# Patient Record
Sex: Female | Born: 1935 | Race: White | Hispanic: No | Smoking: Never smoker
Health system: Southern US, Community
[De-identification: ages and names within clinical notes are randomized; demographics above are authoritative.]

## PROBLEM LIST (undated history)

## (undated) DIAGNOSIS — M1990 Unspecified osteoarthritis, unspecified site: Secondary | ICD-10-CM

## (undated) DIAGNOSIS — Z7902 Long term (current) use of antithrombotics/antiplatelets: Secondary | ICD-10-CM

## (undated) DIAGNOSIS — J45909 Unspecified asthma, uncomplicated: Secondary | ICD-10-CM

## (undated) DIAGNOSIS — I2510 Atherosclerotic heart disease of native coronary artery without angina pectoris: Secondary | ICD-10-CM

## (undated) DIAGNOSIS — I10 Essential (primary) hypertension: Secondary | ICD-10-CM

## (undated) DIAGNOSIS — E785 Hyperlipidemia, unspecified: Secondary | ICD-10-CM

## (undated) DIAGNOSIS — Z7982 Long term (current) use of aspirin: Secondary | ICD-10-CM

## (undated) DIAGNOSIS — I447 Left bundle-branch block, unspecified: Secondary | ICD-10-CM

## (undated) DIAGNOSIS — I779 Disorder of arteries and arterioles, unspecified: Secondary | ICD-10-CM

## (undated) DIAGNOSIS — I6789 Other cerebrovascular disease: Secondary | ICD-10-CM

## (undated) DIAGNOSIS — I493 Ventricular premature depolarization: Secondary | ICD-10-CM

## (undated) DIAGNOSIS — Z9289 Personal history of other medical treatment: Secondary | ICD-10-CM

## (undated) DIAGNOSIS — M5030 Other cervical disc degeneration, unspecified cervical region: Secondary | ICD-10-CM

## (undated) DIAGNOSIS — Z8719 Personal history of other diseases of the digestive system: Secondary | ICD-10-CM

## (undated) DIAGNOSIS — J189 Pneumonia, unspecified organism: Secondary | ICD-10-CM

## (undated) DIAGNOSIS — I700 Atherosclerosis of aorta: Secondary | ICD-10-CM

## (undated) DIAGNOSIS — I639 Cerebral infarction, unspecified: Secondary | ICD-10-CM

## (undated) DIAGNOSIS — N183 Chronic kidney disease, stage 3 unspecified: Secondary | ICD-10-CM

## (undated) DIAGNOSIS — K469 Unspecified abdominal hernia without obstruction or gangrene: Secondary | ICD-10-CM

## (undated) DIAGNOSIS — K219 Gastro-esophageal reflux disease without esophagitis: Secondary | ICD-10-CM

## (undated) DIAGNOSIS — E039 Hypothyroidism, unspecified: Secondary | ICD-10-CM

## (undated) HISTORY — DX: Atherosclerotic heart disease of native coronary artery without angina pectoris: I25.10

## (undated) HISTORY — PX: ABDOMINAL HYSTERECTOMY: SHX81

## (undated) HISTORY — PX: LOOP RECORDER IMPLANT: SHX5954

## (undated) HISTORY — DX: Disorder of arteries and arterioles, unspecified: I77.9

## (undated) HISTORY — DX: Chronic kidney disease, stage 3 unspecified: N18.30

## (undated) HISTORY — DX: Hyperlipidemia, unspecified: E78.5

## (undated) HISTORY — DX: Essential (primary) hypertension: I10

## (undated) HISTORY — DX: Hypothyroidism, unspecified: E03.9

## (undated) HISTORY — PX: TOTAL VAGINAL HYSTERECTOMY: SHX2548

## (undated) HISTORY — DX: Ventricular premature depolarization: I49.3

## (undated) HISTORY — DX: Cerebral infarction, unspecified: I63.9

## (undated) HISTORY — DX: Unspecified asthma, uncomplicated: J45.909

## (undated) HISTORY — PX: TOTAL HIP ARTHROPLASTY: SHX124

## (undated) HISTORY — PX: APPENDECTOMY: SHX54

## (undated) HISTORY — DX: Unspecified osteoarthritis, unspecified site: M19.90

## (undated) HISTORY — PX: EYE SURGERY: SHX253

## (undated) HISTORY — PX: CHOLECYSTECTOMY: SHX55

## (undated) HISTORY — PX: HAND SURGERY: SHX662

## (undated) HISTORY — PX: CATARACT EXTRACTION: SUR2

---

## 2011-12-05 HISTORY — PX: CORONARY ANGIOPLASTY: SHX604

## 2014-12-04 HISTORY — PX: NISSEN FUNDOPLICATION: SHX2091

## 2014-12-04 HISTORY — PX: HERNIA REPAIR: SHX51

## 2015-02-02 DIAGNOSIS — I2510 Atherosclerotic heart disease of native coronary artery without angina pectoris: Secondary | ICD-10-CM

## 2015-02-02 HISTORY — DX: Atherosclerotic heart disease of native coronary artery without angina pectoris: I25.10

## 2015-02-12 ENCOUNTER — Encounter: Payer: Self-pay | Admitting: Cardiovascular Disease

## 2015-02-12 HISTORY — PX: CORONARY ANGIOPLASTY WITH STENT PLACEMENT: SHX49

## 2015-12-05 DIAGNOSIS — I639 Cerebral infarction, unspecified: Secondary | ICD-10-CM

## 2015-12-05 HISTORY — PX: REPLACEMENT TOTAL KNEE: SUR1224

## 2015-12-05 HISTORY — DX: Cerebral infarction, unspecified: I63.9

## 2016-12-12 DIAGNOSIS — I10 Essential (primary) hypertension: Secondary | ICD-10-CM | POA: Diagnosis not present

## 2016-12-12 DIAGNOSIS — F419 Anxiety disorder, unspecified: Secondary | ICD-10-CM | POA: Diagnosis not present

## 2016-12-12 DIAGNOSIS — R578 Other shock: Secondary | ICD-10-CM | POA: Diagnosis not present

## 2016-12-12 DIAGNOSIS — E872 Acidosis: Secondary | ICD-10-CM | POA: Diagnosis not present

## 2016-12-12 DIAGNOSIS — Z79899 Other long term (current) drug therapy: Secondary | ICD-10-CM | POA: Diagnosis not present

## 2016-12-12 DIAGNOSIS — K921 Melena: Secondary | ICD-10-CM | POA: Diagnosis not present

## 2016-12-12 DIAGNOSIS — E509 Vitamin A deficiency, unspecified: Secondary | ICD-10-CM | POA: Diagnosis not present

## 2016-12-12 DIAGNOSIS — K2970 Gastritis, unspecified, without bleeding: Secondary | ICD-10-CM | POA: Diagnosis not present

## 2016-12-12 DIAGNOSIS — D500 Iron deficiency anemia secondary to blood loss (chronic): Secondary | ICD-10-CM | POA: Diagnosis not present

## 2016-12-12 DIAGNOSIS — E861 Hypovolemia: Secondary | ICD-10-CM | POA: Diagnosis not present

## 2016-12-12 DIAGNOSIS — E860 Dehydration: Secondary | ICD-10-CM | POA: Diagnosis not present

## 2016-12-12 DIAGNOSIS — K920 Hematemesis: Secondary | ICD-10-CM | POA: Diagnosis not present

## 2016-12-12 DIAGNOSIS — D509 Iron deficiency anemia, unspecified: Secondary | ICD-10-CM | POA: Diagnosis present

## 2016-12-12 DIAGNOSIS — I2510 Atherosclerotic heart disease of native coronary artery without angina pectoris: Secondary | ICD-10-CM | POA: Diagnosis not present

## 2016-12-12 DIAGNOSIS — N179 Acute kidney failure, unspecified: Secondary | ICD-10-CM | POA: Diagnosis not present

## 2016-12-12 DIAGNOSIS — K2950 Unspecified chronic gastritis without bleeding: Secondary | ICD-10-CM | POA: Diagnosis present

## 2016-12-12 DIAGNOSIS — J45909 Unspecified asthma, uncomplicated: Secondary | ICD-10-CM | POA: Diagnosis present

## 2016-12-12 DIAGNOSIS — R531 Weakness: Secondary | ICD-10-CM | POA: Diagnosis not present

## 2016-12-12 DIAGNOSIS — K922 Gastrointestinal hemorrhage, unspecified: Secondary | ICD-10-CM | POA: Diagnosis not present

## 2016-12-12 DIAGNOSIS — K210 Gastro-esophageal reflux disease with esophagitis: Secondary | ICD-10-CM | POA: Diagnosis not present

## 2016-12-12 DIAGNOSIS — R079 Chest pain, unspecified: Secondary | ICD-10-CM | POA: Diagnosis not present

## 2016-12-12 DIAGNOSIS — F418 Other specified anxiety disorders: Secondary | ICD-10-CM | POA: Diagnosis not present

## 2016-12-12 DIAGNOSIS — F329 Major depressive disorder, single episode, unspecified: Secondary | ICD-10-CM | POA: Diagnosis not present

## 2016-12-12 DIAGNOSIS — R739 Hyperglycemia, unspecified: Secondary | ICD-10-CM | POA: Diagnosis not present

## 2016-12-12 DIAGNOSIS — E785 Hyperlipidemia, unspecified: Secondary | ICD-10-CM | POA: Diagnosis not present

## 2016-12-12 DIAGNOSIS — E039 Hypothyroidism, unspecified: Secondary | ICD-10-CM | POA: Diagnosis not present

## 2016-12-12 DIAGNOSIS — I959 Hypotension, unspecified: Secondary | ICD-10-CM | POA: Diagnosis not present

## 2016-12-12 DIAGNOSIS — R112 Nausea with vomiting, unspecified: Secondary | ICD-10-CM | POA: Diagnosis not present

## 2016-12-12 DIAGNOSIS — K228 Other specified diseases of esophagus: Secondary | ICD-10-CM | POA: Diagnosis present

## 2016-12-12 DIAGNOSIS — D62 Acute posthemorrhagic anemia: Secondary | ICD-10-CM | POA: Diagnosis not present

## 2016-12-29 DIAGNOSIS — K920 Hematemesis: Secondary | ICD-10-CM | POA: Diagnosis not present

## 2016-12-29 DIAGNOSIS — K921 Melena: Secondary | ICD-10-CM | POA: Diagnosis not present

## 2016-12-29 DIAGNOSIS — K210 Gastro-esophageal reflux disease with esophagitis: Secondary | ICD-10-CM | POA: Diagnosis not present

## 2017-01-26 DIAGNOSIS — E559 Vitamin D deficiency, unspecified: Secondary | ICD-10-CM | POA: Diagnosis not present

## 2017-01-26 DIAGNOSIS — R740 Nonspecific elevation of levels of transaminase and lactic acid dehydrogenase [LDH]: Secondary | ICD-10-CM | POA: Diagnosis not present

## 2017-01-26 DIAGNOSIS — Z6828 Body mass index (BMI) 28.0-28.9, adult: Secondary | ICD-10-CM | POA: Diagnosis not present

## 2017-01-26 DIAGNOSIS — I10 Essential (primary) hypertension: Secondary | ICD-10-CM | POA: Diagnosis not present

## 2017-01-26 DIAGNOSIS — M1990 Unspecified osteoarthritis, unspecified site: Secondary | ICD-10-CM | POA: Diagnosis not present

## 2017-01-26 DIAGNOSIS — E876 Hypokalemia: Secondary | ICD-10-CM | POA: Diagnosis not present

## 2017-01-26 DIAGNOSIS — M25569 Pain in unspecified knee: Secondary | ICD-10-CM | POA: Diagnosis not present

## 2017-01-26 DIAGNOSIS — R5383 Other fatigue: Secondary | ICD-10-CM | POA: Diagnosis not present

## 2017-01-26 DIAGNOSIS — N390 Urinary tract infection, site not specified: Secondary | ICD-10-CM | POA: Diagnosis not present

## 2017-01-26 DIAGNOSIS — I2510 Atherosclerotic heart disease of native coronary artery without angina pectoris: Secondary | ICD-10-CM | POA: Diagnosis not present

## 2017-01-26 DIAGNOSIS — R609 Edema, unspecified: Secondary | ICD-10-CM | POA: Diagnosis not present

## 2017-01-26 DIAGNOSIS — N3281 Overactive bladder: Secondary | ICD-10-CM | POA: Diagnosis not present

## 2017-01-26 DIAGNOSIS — E039 Hypothyroidism, unspecified: Secondary | ICD-10-CM | POA: Diagnosis not present

## 2017-01-26 DIAGNOSIS — E784 Other hyperlipidemia: Secondary | ICD-10-CM | POA: Diagnosis not present

## 2017-01-26 DIAGNOSIS — R2681 Unsteadiness on feet: Secondary | ICD-10-CM | POA: Diagnosis not present

## 2017-01-30 DIAGNOSIS — M25569 Pain in unspecified knee: Secondary | ICD-10-CM | POA: Diagnosis not present

## 2017-01-30 DIAGNOSIS — E559 Vitamin D deficiency, unspecified: Secondary | ICD-10-CM | POA: Diagnosis not present

## 2017-01-30 DIAGNOSIS — N3281 Overactive bladder: Secondary | ICD-10-CM | POA: Diagnosis not present

## 2017-01-30 DIAGNOSIS — N390 Urinary tract infection, site not specified: Secondary | ICD-10-CM | POA: Diagnosis not present

## 2017-02-05 DIAGNOSIS — I10 Essential (primary) hypertension: Secondary | ICD-10-CM | POA: Diagnosis not present

## 2017-02-05 DIAGNOSIS — M542 Cervicalgia: Secondary | ICD-10-CM | POA: Diagnosis not present

## 2017-02-05 DIAGNOSIS — Z7982 Long term (current) use of aspirin: Secondary | ICD-10-CM | POA: Diagnosis not present

## 2017-02-05 DIAGNOSIS — Z79899 Other long term (current) drug therapy: Secondary | ICD-10-CM | POA: Diagnosis not present

## 2017-02-05 DIAGNOSIS — J9811 Atelectasis: Secondary | ICD-10-CM | POA: Diagnosis not present

## 2017-02-05 DIAGNOSIS — R079 Chest pain, unspecified: Secondary | ICD-10-CM | POA: Diagnosis not present

## 2017-02-06 DIAGNOSIS — M542 Cervicalgia: Secondary | ICD-10-CM | POA: Diagnosis not present

## 2017-02-09 DIAGNOSIS — M1990 Unspecified osteoarthritis, unspecified site: Secondary | ICD-10-CM | POA: Diagnosis not present

## 2017-02-09 DIAGNOSIS — E559 Vitamin D deficiency, unspecified: Secondary | ICD-10-CM | POA: Diagnosis not present

## 2017-02-09 DIAGNOSIS — N3281 Overactive bladder: Secondary | ICD-10-CM | POA: Diagnosis not present

## 2017-02-09 DIAGNOSIS — M25569 Pain in unspecified knee: Secondary | ICD-10-CM | POA: Diagnosis not present

## 2017-02-09 DIAGNOSIS — F329 Major depressive disorder, single episode, unspecified: Secondary | ICD-10-CM | POA: Diagnosis not present

## 2017-02-09 DIAGNOSIS — M542 Cervicalgia: Secondary | ICD-10-CM | POA: Diagnosis not present

## 2017-02-15 DIAGNOSIS — M25561 Pain in right knee: Secondary | ICD-10-CM | POA: Diagnosis not present

## 2017-02-15 DIAGNOSIS — M25562 Pain in left knee: Secondary | ICD-10-CM | POA: Diagnosis not present

## 2017-02-23 DIAGNOSIS — Z6827 Body mass index (BMI) 27.0-27.9, adult: Secondary | ICD-10-CM | POA: Diagnosis not present

## 2017-02-23 DIAGNOSIS — M2550 Pain in unspecified joint: Secondary | ICD-10-CM | POA: Diagnosis not present

## 2017-02-23 DIAGNOSIS — D649 Anemia, unspecified: Secondary | ICD-10-CM | POA: Diagnosis not present

## 2017-02-28 DIAGNOSIS — I25110 Atherosclerotic heart disease of native coronary artery with unstable angina pectoris: Secondary | ICD-10-CM | POA: Diagnosis not present

## 2017-02-28 DIAGNOSIS — E039 Hypothyroidism, unspecified: Secondary | ICD-10-CM | POA: Diagnosis not present

## 2017-02-28 DIAGNOSIS — E7800 Pure hypercholesterolemia, unspecified: Secondary | ICD-10-CM | POA: Diagnosis not present

## 2017-02-28 DIAGNOSIS — I10 Essential (primary) hypertension: Secondary | ICD-10-CM | POA: Diagnosis not present

## 2017-03-01 DIAGNOSIS — M25562 Pain in left knee: Secondary | ICD-10-CM | POA: Diagnosis not present

## 2017-03-01 DIAGNOSIS — M25561 Pain in right knee: Secondary | ICD-10-CM | POA: Diagnosis not present

## 2017-03-06 DIAGNOSIS — I10 Essential (primary) hypertension: Secondary | ICD-10-CM | POA: Diagnosis not present

## 2017-03-06 DIAGNOSIS — R791 Abnormal coagulation profile: Secondary | ICD-10-CM | POA: Diagnosis not present

## 2017-03-06 DIAGNOSIS — I2510 Atherosclerotic heart disease of native coronary artery without angina pectoris: Secondary | ICD-10-CM | POA: Diagnosis not present

## 2017-03-06 DIAGNOSIS — M25569 Pain in unspecified knee: Secondary | ICD-10-CM | POA: Diagnosis not present

## 2017-03-06 DIAGNOSIS — D649 Anemia, unspecified: Secondary | ICD-10-CM | POA: Diagnosis not present

## 2017-03-06 DIAGNOSIS — Z6826 Body mass index (BMI) 26.0-26.9, adult: Secondary | ICD-10-CM | POA: Diagnosis not present

## 2017-03-06 DIAGNOSIS — N390 Urinary tract infection, site not specified: Secondary | ICD-10-CM | POA: Diagnosis not present

## 2017-03-06 DIAGNOSIS — Z01811 Encounter for preprocedural respiratory examination: Secondary | ICD-10-CM | POA: Diagnosis not present

## 2017-03-06 DIAGNOSIS — R6889 Other general symptoms and signs: Secondary | ICD-10-CM | POA: Diagnosis not present

## 2017-03-06 DIAGNOSIS — Z01818 Encounter for other preprocedural examination: Secondary | ICD-10-CM | POA: Diagnosis not present

## 2017-03-20 DIAGNOSIS — Z8249 Family history of ischemic heart disease and other diseases of the circulatory system: Secondary | ICD-10-CM | POA: Diagnosis not present

## 2017-03-20 DIAGNOSIS — Z7952 Long term (current) use of systemic steroids: Secondary | ICD-10-CM | POA: Diagnosis not present

## 2017-03-20 DIAGNOSIS — F418 Other specified anxiety disorders: Secondary | ICD-10-CM | POA: Diagnosis not present

## 2017-03-20 DIAGNOSIS — Z96652 Presence of left artificial knee joint: Secondary | ICD-10-CM | POA: Diagnosis not present

## 2017-03-20 DIAGNOSIS — N3281 Overactive bladder: Secondary | ICD-10-CM | POA: Diagnosis present

## 2017-03-20 DIAGNOSIS — Z79899 Other long term (current) drug therapy: Secondary | ICD-10-CM | POA: Diagnosis not present

## 2017-03-20 DIAGNOSIS — F329 Major depressive disorder, single episode, unspecified: Secondary | ICD-10-CM | POA: Diagnosis not present

## 2017-03-20 DIAGNOSIS — Z823 Family history of stroke: Secondary | ICD-10-CM | POA: Diagnosis not present

## 2017-03-20 DIAGNOSIS — Z6827 Body mass index (BMI) 27.0-27.9, adult: Secondary | ICD-10-CM | POA: Diagnosis not present

## 2017-03-20 DIAGNOSIS — M25462 Effusion, left knee: Secondary | ICD-10-CM | POA: Diagnosis present

## 2017-03-20 DIAGNOSIS — M25569 Pain in unspecified knee: Secondary | ICD-10-CM | POA: Diagnosis not present

## 2017-03-20 DIAGNOSIS — F419 Anxiety disorder, unspecified: Secondary | ICD-10-CM | POA: Diagnosis present

## 2017-03-20 DIAGNOSIS — Z791 Long term (current) use of non-steroidal anti-inflammatories (NSAID): Secondary | ICD-10-CM | POA: Diagnosis not present

## 2017-03-20 DIAGNOSIS — M21061 Valgus deformity, not elsewhere classified, right knee: Secondary | ICD-10-CM | POA: Diagnosis not present

## 2017-03-20 DIAGNOSIS — R609 Edema, unspecified: Secondary | ICD-10-CM | POA: Diagnosis not present

## 2017-03-20 DIAGNOSIS — I10 Essential (primary) hypertension: Secondary | ICD-10-CM | POA: Diagnosis not present

## 2017-03-20 DIAGNOSIS — R2689 Other abnormalities of gait and mobility: Secondary | ICD-10-CM | POA: Diagnosis not present

## 2017-03-20 DIAGNOSIS — E785 Hyperlipidemia, unspecified: Secondary | ICD-10-CM | POA: Diagnosis present

## 2017-03-20 DIAGNOSIS — Z7982 Long term (current) use of aspirin: Secondary | ICD-10-CM | POA: Diagnosis not present

## 2017-03-20 DIAGNOSIS — M6281 Muscle weakness (generalized): Secondary | ICD-10-CM | POA: Diagnosis not present

## 2017-03-20 DIAGNOSIS — Z96651 Presence of right artificial knee joint: Secondary | ICD-10-CM | POA: Diagnosis not present

## 2017-03-20 DIAGNOSIS — Z96653 Presence of artificial knee joint, bilateral: Secondary | ICD-10-CM | POA: Diagnosis not present

## 2017-03-20 DIAGNOSIS — M810 Age-related osteoporosis without current pathological fracture: Secondary | ICD-10-CM | POA: Diagnosis not present

## 2017-03-20 DIAGNOSIS — M170 Bilateral primary osteoarthritis of knee: Secondary | ICD-10-CM | POA: Diagnosis not present

## 2017-03-20 DIAGNOSIS — E039 Hypothyroidism, unspecified: Secondary | ICD-10-CM | POA: Diagnosis not present

## 2017-03-20 DIAGNOSIS — R278 Other lack of coordination: Secondary | ICD-10-CM | POA: Diagnosis not present

## 2017-03-20 DIAGNOSIS — M25562 Pain in left knee: Secondary | ICD-10-CM | POA: Diagnosis not present

## 2017-03-20 DIAGNOSIS — D649 Anemia, unspecified: Secondary | ICD-10-CM | POA: Diagnosis not present

## 2017-03-20 DIAGNOSIS — M25461 Effusion, right knee: Secondary | ICD-10-CM | POA: Diagnosis present

## 2017-03-20 DIAGNOSIS — I9789 Other postprocedural complications and disorders of the circulatory system, not elsewhere classified: Secondary | ICD-10-CM | POA: Diagnosis not present

## 2017-03-20 DIAGNOSIS — Z7951 Long term (current) use of inhaled steroids: Secondary | ICD-10-CM | POA: Diagnosis not present

## 2017-03-20 DIAGNOSIS — I2510 Atherosclerotic heart disease of native coronary artery without angina pectoris: Secondary | ICD-10-CM | POA: Diagnosis not present

## 2017-03-20 DIAGNOSIS — R2681 Unsteadiness on feet: Secondary | ICD-10-CM | POA: Diagnosis not present

## 2017-03-20 DIAGNOSIS — M21062 Valgus deformity, not elsewhere classified, left knee: Secondary | ICD-10-CM | POA: Diagnosis not present

## 2017-03-20 DIAGNOSIS — M25561 Pain in right knee: Secondary | ICD-10-CM | POA: Diagnosis not present

## 2017-03-20 DIAGNOSIS — Z471 Aftercare following joint replacement surgery: Secondary | ICD-10-CM | POA: Diagnosis not present

## 2017-03-20 DIAGNOSIS — E876 Hypokalemia: Secondary | ICD-10-CM | POA: Diagnosis not present

## 2017-03-23 DIAGNOSIS — E785 Hyperlipidemia, unspecified: Secondary | ICD-10-CM | POA: Diagnosis not present

## 2017-03-23 DIAGNOSIS — R2689 Other abnormalities of gait and mobility: Secondary | ICD-10-CM | POA: Diagnosis not present

## 2017-03-23 DIAGNOSIS — N3281 Overactive bladder: Secondary | ICD-10-CM | POA: Diagnosis not present

## 2017-03-23 DIAGNOSIS — M25562 Pain in left knee: Secondary | ICD-10-CM | POA: Diagnosis not present

## 2017-03-23 DIAGNOSIS — K219 Gastro-esophageal reflux disease without esophagitis: Secondary | ICD-10-CM | POA: Diagnosis not present

## 2017-03-23 DIAGNOSIS — M170 Bilateral primary osteoarthritis of knee: Secondary | ICD-10-CM | POA: Diagnosis not present

## 2017-03-23 DIAGNOSIS — R2681 Unsteadiness on feet: Secondary | ICD-10-CM | POA: Diagnosis not present

## 2017-03-23 DIAGNOSIS — Z6827 Body mass index (BMI) 27.0-27.9, adult: Secondary | ICD-10-CM | POA: Diagnosis not present

## 2017-03-23 DIAGNOSIS — E876 Hypokalemia: Secondary | ICD-10-CM | POA: Diagnosis not present

## 2017-03-23 DIAGNOSIS — Z96653 Presence of artificial knee joint, bilateral: Secondary | ICD-10-CM | POA: Diagnosis not present

## 2017-03-23 DIAGNOSIS — M6281 Muscle weakness (generalized): Secondary | ICD-10-CM | POA: Diagnosis not present

## 2017-03-23 DIAGNOSIS — I2510 Atherosclerotic heart disease of native coronary artery without angina pectoris: Secondary | ICD-10-CM | POA: Diagnosis not present

## 2017-03-23 DIAGNOSIS — R278 Other lack of coordination: Secondary | ICD-10-CM | POA: Diagnosis not present

## 2017-03-23 DIAGNOSIS — M25569 Pain in unspecified knee: Secondary | ICD-10-CM | POA: Diagnosis not present

## 2017-03-23 DIAGNOSIS — F329 Major depressive disorder, single episode, unspecified: Secondary | ICD-10-CM | POA: Diagnosis not present

## 2017-03-23 DIAGNOSIS — D649 Anemia, unspecified: Secondary | ICD-10-CM | POA: Diagnosis not present

## 2017-03-23 DIAGNOSIS — E039 Hypothyroidism, unspecified: Secondary | ICD-10-CM | POA: Diagnosis not present

## 2017-03-23 DIAGNOSIS — R609 Edema, unspecified: Secondary | ICD-10-CM | POA: Diagnosis not present

## 2017-03-23 DIAGNOSIS — I10 Essential (primary) hypertension: Secondary | ICD-10-CM | POA: Diagnosis not present

## 2017-03-23 DIAGNOSIS — M25561 Pain in right knee: Secondary | ICD-10-CM | POA: Diagnosis not present

## 2017-03-26 DIAGNOSIS — K219 Gastro-esophageal reflux disease without esophagitis: Secondary | ICD-10-CM | POA: Diagnosis not present

## 2017-03-26 DIAGNOSIS — I2510 Atherosclerotic heart disease of native coronary artery without angina pectoris: Secondary | ICD-10-CM | POA: Diagnosis not present

## 2017-03-26 DIAGNOSIS — I10 Essential (primary) hypertension: Secondary | ICD-10-CM | POA: Diagnosis not present

## 2017-03-26 DIAGNOSIS — E785 Hyperlipidemia, unspecified: Secondary | ICD-10-CM | POA: Diagnosis not present

## 2017-03-27 DIAGNOSIS — I10 Essential (primary) hypertension: Secondary | ICD-10-CM | POA: Diagnosis not present

## 2017-03-27 DIAGNOSIS — K219 Gastro-esophageal reflux disease without esophagitis: Secondary | ICD-10-CM | POA: Diagnosis not present

## 2017-03-27 DIAGNOSIS — I2510 Atherosclerotic heart disease of native coronary artery without angina pectoris: Secondary | ICD-10-CM | POA: Diagnosis not present

## 2017-03-27 DIAGNOSIS — E785 Hyperlipidemia, unspecified: Secondary | ICD-10-CM | POA: Diagnosis not present

## 2017-03-29 DIAGNOSIS — E785 Hyperlipidemia, unspecified: Secondary | ICD-10-CM | POA: Diagnosis not present

## 2017-03-29 DIAGNOSIS — I2510 Atherosclerotic heart disease of native coronary artery without angina pectoris: Secondary | ICD-10-CM | POA: Diagnosis not present

## 2017-03-29 DIAGNOSIS — K219 Gastro-esophageal reflux disease without esophagitis: Secondary | ICD-10-CM | POA: Diagnosis not present

## 2017-03-29 DIAGNOSIS — I10 Essential (primary) hypertension: Secondary | ICD-10-CM | POA: Diagnosis not present

## 2017-03-30 DIAGNOSIS — K219 Gastro-esophageal reflux disease without esophagitis: Secondary | ICD-10-CM | POA: Diagnosis not present

## 2017-03-30 DIAGNOSIS — I10 Essential (primary) hypertension: Secondary | ICD-10-CM | POA: Diagnosis not present

## 2017-03-30 DIAGNOSIS — E785 Hyperlipidemia, unspecified: Secondary | ICD-10-CM | POA: Diagnosis not present

## 2017-03-30 DIAGNOSIS — I2510 Atherosclerotic heart disease of native coronary artery without angina pectoris: Secondary | ICD-10-CM | POA: Diagnosis not present

## 2017-04-02 DIAGNOSIS — I2510 Atherosclerotic heart disease of native coronary artery without angina pectoris: Secondary | ICD-10-CM | POA: Diagnosis not present

## 2017-04-02 DIAGNOSIS — E785 Hyperlipidemia, unspecified: Secondary | ICD-10-CM | POA: Diagnosis not present

## 2017-04-02 DIAGNOSIS — Z96653 Presence of artificial knee joint, bilateral: Secondary | ICD-10-CM | POA: Diagnosis not present

## 2017-04-02 DIAGNOSIS — K219 Gastro-esophageal reflux disease without esophagitis: Secondary | ICD-10-CM | POA: Diagnosis not present

## 2017-04-02 DIAGNOSIS — I10 Essential (primary) hypertension: Secondary | ICD-10-CM | POA: Diagnosis not present

## 2017-04-03 DIAGNOSIS — I2510 Atherosclerotic heart disease of native coronary artery without angina pectoris: Secondary | ICD-10-CM | POA: Diagnosis not present

## 2017-04-03 DIAGNOSIS — E785 Hyperlipidemia, unspecified: Secondary | ICD-10-CM | POA: Diagnosis not present

## 2017-04-03 DIAGNOSIS — K219 Gastro-esophageal reflux disease without esophagitis: Secondary | ICD-10-CM | POA: Diagnosis not present

## 2017-04-03 DIAGNOSIS — I10 Essential (primary) hypertension: Secondary | ICD-10-CM | POA: Diagnosis not present

## 2017-04-06 DIAGNOSIS — M170 Bilateral primary osteoarthritis of knee: Secondary | ICD-10-CM | POA: Diagnosis not present

## 2017-04-06 DIAGNOSIS — R262 Difficulty in walking, not elsewhere classified: Secondary | ICD-10-CM | POA: Diagnosis not present

## 2017-04-06 DIAGNOSIS — Z96653 Presence of artificial knee joint, bilateral: Secondary | ICD-10-CM | POA: Diagnosis not present

## 2017-04-06 DIAGNOSIS — M6281 Muscle weakness (generalized): Secondary | ICD-10-CM | POA: Diagnosis not present

## 2017-04-06 DIAGNOSIS — R269 Unspecified abnormalities of gait and mobility: Secondary | ICD-10-CM | POA: Diagnosis not present

## 2017-04-09 DIAGNOSIS — Z96653 Presence of artificial knee joint, bilateral: Secondary | ICD-10-CM | POA: Diagnosis not present

## 2017-04-09 DIAGNOSIS — R262 Difficulty in walking, not elsewhere classified: Secondary | ICD-10-CM | POA: Diagnosis not present

## 2017-04-09 DIAGNOSIS — M6281 Muscle weakness (generalized): Secondary | ICD-10-CM | POA: Diagnosis not present

## 2017-04-09 DIAGNOSIS — M170 Bilateral primary osteoarthritis of knee: Secondary | ICD-10-CM | POA: Diagnosis not present

## 2017-04-09 DIAGNOSIS — R269 Unspecified abnormalities of gait and mobility: Secondary | ICD-10-CM | POA: Diagnosis not present

## 2017-04-10 DIAGNOSIS — R262 Difficulty in walking, not elsewhere classified: Secondary | ICD-10-CM | POA: Diagnosis not present

## 2017-04-10 DIAGNOSIS — M6281 Muscle weakness (generalized): Secondary | ICD-10-CM | POA: Diagnosis not present

## 2017-04-10 DIAGNOSIS — R269 Unspecified abnormalities of gait and mobility: Secondary | ICD-10-CM | POA: Diagnosis not present

## 2017-04-10 DIAGNOSIS — Z96653 Presence of artificial knee joint, bilateral: Secondary | ICD-10-CM | POA: Diagnosis not present

## 2017-04-10 DIAGNOSIS — M170 Bilateral primary osteoarthritis of knee: Secondary | ICD-10-CM | POA: Diagnosis not present

## 2017-04-11 DIAGNOSIS — M6281 Muscle weakness (generalized): Secondary | ICD-10-CM | POA: Diagnosis not present

## 2017-04-11 DIAGNOSIS — M170 Bilateral primary osteoarthritis of knee: Secondary | ICD-10-CM | POA: Diagnosis not present

## 2017-04-11 DIAGNOSIS — R269 Unspecified abnormalities of gait and mobility: Secondary | ICD-10-CM | POA: Diagnosis not present

## 2017-04-11 DIAGNOSIS — Z96653 Presence of artificial knee joint, bilateral: Secondary | ICD-10-CM | POA: Diagnosis not present

## 2017-04-11 DIAGNOSIS — R262 Difficulty in walking, not elsewhere classified: Secondary | ICD-10-CM | POA: Diagnosis not present

## 2017-04-13 DIAGNOSIS — M6281 Muscle weakness (generalized): Secondary | ICD-10-CM | POA: Diagnosis not present

## 2017-04-13 DIAGNOSIS — Z96653 Presence of artificial knee joint, bilateral: Secondary | ICD-10-CM | POA: Diagnosis not present

## 2017-04-13 DIAGNOSIS — R269 Unspecified abnormalities of gait and mobility: Secondary | ICD-10-CM | POA: Diagnosis not present

## 2017-04-13 DIAGNOSIS — M170 Bilateral primary osteoarthritis of knee: Secondary | ICD-10-CM | POA: Diagnosis not present

## 2017-04-13 DIAGNOSIS — R262 Difficulty in walking, not elsewhere classified: Secondary | ICD-10-CM | POA: Diagnosis not present

## 2017-04-16 DIAGNOSIS — R7989 Other specified abnormal findings of blood chemistry: Secondary | ICD-10-CM | POA: Diagnosis not present

## 2017-04-16 DIAGNOSIS — Z6828 Body mass index (BMI) 28.0-28.9, adult: Secondary | ICD-10-CM | POA: Diagnosis not present

## 2017-04-16 DIAGNOSIS — D649 Anemia, unspecified: Secondary | ICD-10-CM | POA: Diagnosis not present

## 2017-04-16 DIAGNOSIS — L03116 Cellulitis of left lower limb: Secondary | ICD-10-CM | POA: Diagnosis not present

## 2017-04-16 DIAGNOSIS — R609 Edema, unspecified: Secondary | ICD-10-CM | POA: Diagnosis not present

## 2017-04-16 DIAGNOSIS — M25569 Pain in unspecified knee: Secondary | ICD-10-CM | POA: Diagnosis not present

## 2017-04-16 DIAGNOSIS — K219 Gastro-esophageal reflux disease without esophagitis: Secondary | ICD-10-CM | POA: Diagnosis not present

## 2017-04-16 DIAGNOSIS — M1990 Unspecified osteoarthritis, unspecified site: Secondary | ICD-10-CM | POA: Diagnosis not present

## 2017-04-17 DIAGNOSIS — Z96653 Presence of artificial knee joint, bilateral: Secondary | ICD-10-CM | POA: Diagnosis not present

## 2017-04-17 DIAGNOSIS — R269 Unspecified abnormalities of gait and mobility: Secondary | ICD-10-CM | POA: Diagnosis not present

## 2017-04-17 DIAGNOSIS — R262 Difficulty in walking, not elsewhere classified: Secondary | ICD-10-CM | POA: Diagnosis not present

## 2017-04-17 DIAGNOSIS — M6281 Muscle weakness (generalized): Secondary | ICD-10-CM | POA: Diagnosis not present

## 2017-04-17 DIAGNOSIS — M170 Bilateral primary osteoarthritis of knee: Secondary | ICD-10-CM | POA: Diagnosis not present

## 2017-04-19 DIAGNOSIS — Z96653 Presence of artificial knee joint, bilateral: Secondary | ICD-10-CM | POA: Diagnosis not present

## 2017-04-19 DIAGNOSIS — M170 Bilateral primary osteoarthritis of knee: Secondary | ICD-10-CM | POA: Diagnosis not present

## 2017-04-19 DIAGNOSIS — R269 Unspecified abnormalities of gait and mobility: Secondary | ICD-10-CM | POA: Diagnosis not present

## 2017-04-19 DIAGNOSIS — R262 Difficulty in walking, not elsewhere classified: Secondary | ICD-10-CM | POA: Diagnosis not present

## 2017-04-19 DIAGNOSIS — M6281 Muscle weakness (generalized): Secondary | ICD-10-CM | POA: Diagnosis not present

## 2017-04-20 DIAGNOSIS — R269 Unspecified abnormalities of gait and mobility: Secondary | ICD-10-CM | POA: Diagnosis not present

## 2017-04-20 DIAGNOSIS — M170 Bilateral primary osteoarthritis of knee: Secondary | ICD-10-CM | POA: Diagnosis not present

## 2017-04-20 DIAGNOSIS — R262 Difficulty in walking, not elsewhere classified: Secondary | ICD-10-CM | POA: Diagnosis not present

## 2017-04-20 DIAGNOSIS — M6281 Muscle weakness (generalized): Secondary | ICD-10-CM | POA: Diagnosis not present

## 2017-04-20 DIAGNOSIS — Z96653 Presence of artificial knee joint, bilateral: Secondary | ICD-10-CM | POA: Diagnosis not present

## 2017-04-23 DIAGNOSIS — M1990 Unspecified osteoarthritis, unspecified site: Secondary | ICD-10-CM | POA: Diagnosis not present

## 2017-04-23 DIAGNOSIS — L03116 Cellulitis of left lower limb: Secondary | ICD-10-CM | POA: Diagnosis not present

## 2017-04-23 DIAGNOSIS — J302 Other seasonal allergic rhinitis: Secondary | ICD-10-CM | POA: Diagnosis not present

## 2017-04-23 DIAGNOSIS — J45909 Unspecified asthma, uncomplicated: Secondary | ICD-10-CM | POA: Diagnosis not present

## 2017-04-23 DIAGNOSIS — D649 Anemia, unspecified: Secondary | ICD-10-CM | POA: Diagnosis not present

## 2017-04-23 DIAGNOSIS — Z6828 Body mass index (BMI) 28.0-28.9, adult: Secondary | ICD-10-CM | POA: Diagnosis not present

## 2017-04-23 DIAGNOSIS — R609 Edema, unspecified: Secondary | ICD-10-CM | POA: Diagnosis not present

## 2017-04-24 DIAGNOSIS — R269 Unspecified abnormalities of gait and mobility: Secondary | ICD-10-CM | POA: Diagnosis not present

## 2017-04-24 DIAGNOSIS — M170 Bilateral primary osteoarthritis of knee: Secondary | ICD-10-CM | POA: Diagnosis not present

## 2017-04-24 DIAGNOSIS — Z96653 Presence of artificial knee joint, bilateral: Secondary | ICD-10-CM | POA: Diagnosis not present

## 2017-04-24 DIAGNOSIS — M6281 Muscle weakness (generalized): Secondary | ICD-10-CM | POA: Diagnosis not present

## 2017-04-24 DIAGNOSIS — R262 Difficulty in walking, not elsewhere classified: Secondary | ICD-10-CM | POA: Diagnosis not present

## 2017-04-26 DIAGNOSIS — M6281 Muscle weakness (generalized): Secondary | ICD-10-CM | POA: Diagnosis not present

## 2017-04-26 DIAGNOSIS — Z96653 Presence of artificial knee joint, bilateral: Secondary | ICD-10-CM | POA: Diagnosis not present

## 2017-04-26 DIAGNOSIS — R262 Difficulty in walking, not elsewhere classified: Secondary | ICD-10-CM | POA: Diagnosis not present

## 2017-04-26 DIAGNOSIS — R269 Unspecified abnormalities of gait and mobility: Secondary | ICD-10-CM | POA: Diagnosis not present

## 2017-04-26 DIAGNOSIS — M170 Bilateral primary osteoarthritis of knee: Secondary | ICD-10-CM | POA: Diagnosis not present

## 2017-04-27 DIAGNOSIS — E785 Hyperlipidemia, unspecified: Secondary | ICD-10-CM | POA: Diagnosis not present

## 2017-04-27 DIAGNOSIS — R4182 Altered mental status, unspecified: Secondary | ICD-10-CM | POA: Diagnosis not present

## 2017-04-27 DIAGNOSIS — Z96653 Presence of artificial knee joint, bilateral: Secondary | ICD-10-CM | POA: Diagnosis not present

## 2017-04-27 DIAGNOSIS — G4700 Insomnia, unspecified: Secondary | ICD-10-CM | POA: Diagnosis not present

## 2017-04-27 DIAGNOSIS — M6281 Muscle weakness (generalized): Secondary | ICD-10-CM | POA: Diagnosis not present

## 2017-04-27 DIAGNOSIS — M170 Bilateral primary osteoarthritis of knee: Secondary | ICD-10-CM | POA: Diagnosis not present

## 2017-04-27 DIAGNOSIS — E039 Hypothyroidism, unspecified: Secondary | ICD-10-CM | POA: Diagnosis not present

## 2017-04-27 DIAGNOSIS — I129 Hypertensive chronic kidney disease with stage 1 through stage 4 chronic kidney disease, or unspecified chronic kidney disease: Secondary | ICD-10-CM | POA: Diagnosis not present

## 2017-04-27 DIAGNOSIS — N189 Chronic kidney disease, unspecified: Secondary | ICD-10-CM | POA: Diagnosis not present

## 2017-04-27 DIAGNOSIS — R269 Unspecified abnormalities of gait and mobility: Secondary | ICD-10-CM | POA: Diagnosis not present

## 2017-04-27 DIAGNOSIS — I638 Other cerebral infarction: Secondary | ICD-10-CM | POA: Diagnosis not present

## 2017-04-27 DIAGNOSIS — R262 Difficulty in walking, not elsewhere classified: Secondary | ICD-10-CM | POA: Diagnosis not present

## 2017-04-27 DIAGNOSIS — R404 Transient alteration of awareness: Secondary | ICD-10-CM | POA: Diagnosis not present

## 2017-04-28 DIAGNOSIS — Y710 Diagnostic and monitoring cardiovascular devices associated with adverse incidents: Secondary | ICD-10-CM | POA: Diagnosis not present

## 2017-04-28 DIAGNOSIS — M25569 Pain in unspecified knee: Secondary | ICD-10-CM | POA: Diagnosis not present

## 2017-04-28 DIAGNOSIS — I639 Cerebral infarction, unspecified: Secondary | ICD-10-CM | POA: Diagnosis not present

## 2017-04-28 DIAGNOSIS — R270 Ataxia, unspecified: Secondary | ICD-10-CM | POA: Diagnosis not present

## 2017-04-28 DIAGNOSIS — I6523 Occlusion and stenosis of bilateral carotid arteries: Secondary | ICD-10-CM | POA: Diagnosis not present

## 2017-04-28 DIAGNOSIS — M7989 Other specified soft tissue disorders: Secondary | ICD-10-CM | POA: Diagnosis not present

## 2017-04-28 DIAGNOSIS — R410 Disorientation, unspecified: Secondary | ICD-10-CM | POA: Diagnosis not present

## 2017-04-28 DIAGNOSIS — R4182 Altered mental status, unspecified: Secondary | ICD-10-CM | POA: Diagnosis not present

## 2017-04-29 DIAGNOSIS — I639 Cerebral infarction, unspecified: Secondary | ICD-10-CM | POA: Diagnosis not present

## 2017-04-29 DIAGNOSIS — I517 Cardiomegaly: Secondary | ICD-10-CM | POA: Diagnosis not present

## 2017-04-29 DIAGNOSIS — M25569 Pain in unspecified knee: Secondary | ICD-10-CM | POA: Diagnosis not present

## 2017-04-29 DIAGNOSIS — E039 Hypothyroidism, unspecified: Secondary | ICD-10-CM | POA: Diagnosis present

## 2017-04-29 DIAGNOSIS — I63233 Cerebral infarction due to unspecified occlusion or stenosis of bilateral carotid arteries: Secondary | ICD-10-CM | POA: Diagnosis not present

## 2017-04-29 DIAGNOSIS — I358 Other nonrheumatic aortic valve disorders: Secondary | ICD-10-CM | POA: Diagnosis not present

## 2017-04-29 DIAGNOSIS — G4700 Insomnia, unspecified: Secondary | ICD-10-CM | POA: Diagnosis not present

## 2017-04-29 DIAGNOSIS — R270 Ataxia, unspecified: Secondary | ICD-10-CM | POA: Diagnosis present

## 2017-04-29 DIAGNOSIS — R410 Disorientation, unspecified: Secondary | ICD-10-CM | POA: Diagnosis not present

## 2017-04-29 DIAGNOSIS — Z6827 Body mass index (BMI) 27.0-27.9, adult: Secondary | ICD-10-CM | POA: Diagnosis not present

## 2017-04-29 DIAGNOSIS — I129 Hypertensive chronic kidney disease with stage 1 through stage 4 chronic kidney disease, or unspecified chronic kidney disease: Secondary | ICD-10-CM | POA: Diagnosis present

## 2017-04-29 DIAGNOSIS — G459 Transient cerebral ischemic attack, unspecified: Secondary | ICD-10-CM | POA: Diagnosis not present

## 2017-04-29 DIAGNOSIS — Z7902 Long term (current) use of antithrombotics/antiplatelets: Secondary | ICD-10-CM | POA: Diagnosis not present

## 2017-04-29 DIAGNOSIS — I371 Nonrheumatic pulmonary valve insufficiency: Secondary | ICD-10-CM | POA: Diagnosis not present

## 2017-04-29 DIAGNOSIS — Z96641 Presence of right artificial hip joint: Secondary | ICD-10-CM | POA: Diagnosis present

## 2017-04-29 DIAGNOSIS — R4182 Altered mental status, unspecified: Secondary | ICD-10-CM | POA: Diagnosis not present

## 2017-04-29 DIAGNOSIS — I638 Other cerebral infarction: Secondary | ICD-10-CM | POA: Diagnosis present

## 2017-04-29 DIAGNOSIS — Z79891 Long term (current) use of opiate analgesic: Secondary | ICD-10-CM | POA: Diagnosis not present

## 2017-04-29 DIAGNOSIS — G969 Disorder of central nervous system, unspecified: Secondary | ICD-10-CM | POA: Diagnosis not present

## 2017-04-29 DIAGNOSIS — Z96653 Presence of artificial knee joint, bilateral: Secondary | ICD-10-CM | POA: Diagnosis present

## 2017-04-29 DIAGNOSIS — Z79899 Other long term (current) drug therapy: Secondary | ICD-10-CM | POA: Diagnosis not present

## 2017-04-29 DIAGNOSIS — I348 Other nonrheumatic mitral valve disorders: Secondary | ICD-10-CM | POA: Diagnosis not present

## 2017-04-29 DIAGNOSIS — M47812 Spondylosis without myelopathy or radiculopathy, cervical region: Secondary | ICD-10-CM | POA: Diagnosis present

## 2017-04-29 DIAGNOSIS — E663 Overweight: Secondary | ICD-10-CM | POA: Diagnosis present

## 2017-04-29 DIAGNOSIS — I6522 Occlusion and stenosis of left carotid artery: Secondary | ICD-10-CM | POA: Diagnosis present

## 2017-04-29 DIAGNOSIS — J45909 Unspecified asthma, uncomplicated: Secondary | ICD-10-CM | POA: Diagnosis present

## 2017-04-29 DIAGNOSIS — N189 Chronic kidney disease, unspecified: Secondary | ICD-10-CM | POA: Diagnosis present

## 2017-04-29 DIAGNOSIS — K219 Gastro-esophageal reflux disease without esophagitis: Secondary | ICD-10-CM | POA: Diagnosis present

## 2017-04-29 DIAGNOSIS — R29700 NIHSS score 0: Secondary | ICD-10-CM | POA: Diagnosis present

## 2017-04-29 DIAGNOSIS — E785 Hyperlipidemia, unspecified: Secondary | ICD-10-CM | POA: Diagnosis present

## 2017-04-29 DIAGNOSIS — I081 Rheumatic disorders of both mitral and tricuspid valves: Secondary | ICD-10-CM | POA: Diagnosis not present

## 2017-04-29 DIAGNOSIS — I2510 Atherosclerotic heart disease of native coronary artery without angina pectoris: Secondary | ICD-10-CM | POA: Diagnosis present

## 2017-04-29 DIAGNOSIS — Z955 Presence of coronary angioplasty implant and graft: Secondary | ICD-10-CM | POA: Diagnosis not present

## 2017-05-14 DIAGNOSIS — M1990 Unspecified osteoarthritis, unspecified site: Secondary | ICD-10-CM | POA: Diagnosis not present

## 2017-05-14 DIAGNOSIS — I10 Essential (primary) hypertension: Secondary | ICD-10-CM | POA: Diagnosis not present

## 2017-05-14 DIAGNOSIS — E039 Hypothyroidism, unspecified: Secondary | ICD-10-CM | POA: Diagnosis not present

## 2017-05-14 DIAGNOSIS — D649 Anemia, unspecified: Secondary | ICD-10-CM | POA: Diagnosis not present

## 2017-05-14 DIAGNOSIS — I2510 Atherosclerotic heart disease of native coronary artery without angina pectoris: Secondary | ICD-10-CM | POA: Diagnosis not present

## 2017-05-14 DIAGNOSIS — I679 Cerebrovascular disease, unspecified: Secondary | ICD-10-CM | POA: Diagnosis not present

## 2017-05-14 DIAGNOSIS — E784 Other hyperlipidemia: Secondary | ICD-10-CM | POA: Diagnosis not present

## 2017-05-14 DIAGNOSIS — Z6826 Body mass index (BMI) 26.0-26.9, adult: Secondary | ICD-10-CM | POA: Diagnosis not present

## 2017-05-15 DIAGNOSIS — M1990 Unspecified osteoarthritis, unspecified site: Secondary | ICD-10-CM | POA: Diagnosis not present

## 2017-05-15 DIAGNOSIS — Z789 Other specified health status: Secondary | ICD-10-CM | POA: Diagnosis not present

## 2017-05-15 DIAGNOSIS — E039 Hypothyroidism, unspecified: Secondary | ICD-10-CM | POA: Diagnosis not present

## 2017-05-15 DIAGNOSIS — E785 Hyperlipidemia, unspecified: Secondary | ICD-10-CM | POA: Diagnosis not present

## 2017-05-15 DIAGNOSIS — I10 Essential (primary) hypertension: Secondary | ICD-10-CM | POA: Diagnosis not present

## 2017-05-15 DIAGNOSIS — D649 Anemia, unspecified: Secondary | ICD-10-CM | POA: Diagnosis not present

## 2017-05-17 DIAGNOSIS — I4891 Unspecified atrial fibrillation: Secondary | ICD-10-CM | POA: Diagnosis not present

## 2017-05-24 DIAGNOSIS — I639 Cerebral infarction, unspecified: Secondary | ICD-10-CM | POA: Diagnosis not present

## 2017-06-01 DIAGNOSIS — K5289 Other specified noninfective gastroenteritis and colitis: Secondary | ICD-10-CM | POA: Diagnosis not present

## 2017-06-01 DIAGNOSIS — K210 Gastro-esophageal reflux disease with esophagitis: Secondary | ICD-10-CM | POA: Diagnosis not present

## 2017-06-15 DIAGNOSIS — I10 Essential (primary) hypertension: Secondary | ICD-10-CM | POA: Diagnosis not present

## 2017-06-15 DIAGNOSIS — I639 Cerebral infarction, unspecified: Secondary | ICD-10-CM | POA: Diagnosis not present

## 2017-06-15 DIAGNOSIS — I2510 Atherosclerotic heart disease of native coronary artery without angina pectoris: Secondary | ICD-10-CM | POA: Diagnosis not present

## 2017-06-19 DIAGNOSIS — I493 Ventricular premature depolarization: Secondary | ICD-10-CM | POA: Diagnosis not present

## 2017-06-21 DIAGNOSIS — K3184 Gastroparesis: Secondary | ICD-10-CM | POA: Diagnosis not present

## 2017-06-21 DIAGNOSIS — T182XXA Foreign body in stomach, initial encounter: Secondary | ICD-10-CM | POA: Diagnosis not present

## 2017-06-21 DIAGNOSIS — K210 Gastro-esophageal reflux disease with esophagitis: Secondary | ICD-10-CM | POA: Diagnosis not present

## 2017-06-21 DIAGNOSIS — K2970 Gastritis, unspecified, without bleeding: Secondary | ICD-10-CM | POA: Diagnosis not present

## 2017-06-21 DIAGNOSIS — K228 Other specified diseases of esophagus: Secondary | ICD-10-CM | POA: Diagnosis not present

## 2017-06-21 DIAGNOSIS — K2270 Barrett's esophagus without dysplasia: Secondary | ICD-10-CM | POA: Diagnosis not present

## 2017-06-26 DIAGNOSIS — H3581 Retinal edema: Secondary | ICD-10-CM | POA: Insufficient documentation

## 2017-06-29 DIAGNOSIS — Z961 Presence of intraocular lens: Secondary | ICD-10-CM | POA: Diagnosis not present

## 2017-06-29 DIAGNOSIS — H43811 Vitreous degeneration, right eye: Secondary | ICD-10-CM | POA: Diagnosis not present

## 2017-06-29 DIAGNOSIS — H34231 Retinal artery branch occlusion, right eye: Secondary | ICD-10-CM | POA: Diagnosis not present

## 2017-07-06 DIAGNOSIS — Z7982 Long term (current) use of aspirin: Secondary | ICD-10-CM | POA: Diagnosis not present

## 2017-07-06 DIAGNOSIS — Z823 Family history of stroke: Secondary | ICD-10-CM | POA: Diagnosis not present

## 2017-07-06 DIAGNOSIS — Z96653 Presence of artificial knee joint, bilateral: Secondary | ICD-10-CM | POA: Diagnosis not present

## 2017-07-06 DIAGNOSIS — Z8673 Personal history of transient ischemic attack (TIA), and cerebral infarction without residual deficits: Secondary | ICD-10-CM | POA: Diagnosis not present

## 2017-07-06 DIAGNOSIS — I2510 Atherosclerotic heart disease of native coronary artery without angina pectoris: Secondary | ICD-10-CM | POA: Diagnosis not present

## 2017-07-06 DIAGNOSIS — Z955 Presence of coronary angioplasty implant and graft: Secondary | ICD-10-CM | POA: Diagnosis not present

## 2017-07-06 DIAGNOSIS — I10 Essential (primary) hypertension: Secondary | ICD-10-CM | POA: Diagnosis not present

## 2017-07-06 DIAGNOSIS — I6522 Occlusion and stenosis of left carotid artery: Secondary | ICD-10-CM | POA: Diagnosis not present

## 2017-07-06 DIAGNOSIS — J45909 Unspecified asthma, uncomplicated: Secondary | ICD-10-CM | POA: Diagnosis not present

## 2017-07-06 DIAGNOSIS — Z7902 Long term (current) use of antithrombotics/antiplatelets: Secondary | ICD-10-CM | POA: Diagnosis not present

## 2017-07-06 DIAGNOSIS — I639 Cerebral infarction, unspecified: Secondary | ICD-10-CM | POA: Diagnosis not present

## 2017-07-17 DIAGNOSIS — I639 Cerebral infarction, unspecified: Secondary | ICD-10-CM | POA: Diagnosis not present

## 2017-07-18 DIAGNOSIS — I639 Cerebral infarction, unspecified: Secondary | ICD-10-CM | POA: Diagnosis not present

## 2017-07-27 DIAGNOSIS — H43811 Vitreous degeneration, right eye: Secondary | ICD-10-CM | POA: Diagnosis not present

## 2017-07-27 DIAGNOSIS — Z961 Presence of intraocular lens: Secondary | ICD-10-CM | POA: Diagnosis not present

## 2017-07-27 DIAGNOSIS — H3581 Retinal edema: Secondary | ICD-10-CM | POA: Diagnosis not present

## 2017-07-27 DIAGNOSIS — H47011 Ischemic optic neuropathy, right eye: Secondary | ICD-10-CM | POA: Diagnosis not present

## 2017-07-27 DIAGNOSIS — H34231 Retinal artery branch occlusion, right eye: Secondary | ICD-10-CM | POA: Diagnosis not present

## 2017-08-09 DIAGNOSIS — H04123 Dry eye syndrome of bilateral lacrimal glands: Secondary | ICD-10-CM | POA: Insufficient documentation

## 2017-08-14 DIAGNOSIS — E785 Hyperlipidemia, unspecified: Secondary | ICD-10-CM | POA: Diagnosis not present

## 2017-08-14 DIAGNOSIS — I10 Essential (primary) hypertension: Secondary | ICD-10-CM | POA: Diagnosis not present

## 2017-08-14 DIAGNOSIS — I2510 Atherosclerotic heart disease of native coronary artery without angina pectoris: Secondary | ICD-10-CM | POA: Diagnosis not present

## 2017-08-14 DIAGNOSIS — I639 Cerebral infarction, unspecified: Secondary | ICD-10-CM | POA: Diagnosis not present

## 2017-09-06 DIAGNOSIS — H04123 Dry eye syndrome of bilateral lacrimal glands: Secondary | ICD-10-CM | POA: Diagnosis not present

## 2017-10-03 DIAGNOSIS — Z23 Encounter for immunization: Secondary | ICD-10-CM | POA: Diagnosis not present

## 2017-12-06 DIAGNOSIS — I639 Cerebral infarction, unspecified: Secondary | ICD-10-CM | POA: Diagnosis not present

## 2017-12-07 DIAGNOSIS — I639 Cerebral infarction, unspecified: Secondary | ICD-10-CM | POA: Diagnosis not present

## 2018-05-02 DIAGNOSIS — I639 Cerebral infarction, unspecified: Secondary | ICD-10-CM | POA: Diagnosis not present

## 2018-05-20 DIAGNOSIS — R5383 Other fatigue: Secondary | ICD-10-CM | POA: Diagnosis not present

## 2018-05-20 DIAGNOSIS — E782 Mixed hyperlipidemia: Secondary | ICD-10-CM | POA: Diagnosis not present

## 2018-05-20 DIAGNOSIS — D649 Anemia, unspecified: Secondary | ICD-10-CM | POA: Diagnosis not present

## 2018-05-20 DIAGNOSIS — E039 Hypothyroidism, unspecified: Secondary | ICD-10-CM | POA: Diagnosis not present

## 2018-05-20 DIAGNOSIS — J4520 Mild intermittent asthma, uncomplicated: Secondary | ICD-10-CM | POA: Insufficient documentation

## 2018-05-20 DIAGNOSIS — R1084 Generalized abdominal pain: Secondary | ICD-10-CM | POA: Diagnosis not present

## 2018-05-20 DIAGNOSIS — I10 Essential (primary) hypertension: Secondary | ICD-10-CM | POA: Insufficient documentation

## 2018-05-20 DIAGNOSIS — E785 Hyperlipidemia, unspecified: Secondary | ICD-10-CM | POA: Insufficient documentation

## 2018-05-22 DIAGNOSIS — D649 Anemia, unspecified: Secondary | ICD-10-CM | POA: Diagnosis not present

## 2018-05-22 DIAGNOSIS — I10 Essential (primary) hypertension: Secondary | ICD-10-CM | POA: Diagnosis not present

## 2018-05-22 DIAGNOSIS — R5383 Other fatigue: Secondary | ICD-10-CM | POA: Diagnosis not present

## 2018-05-22 DIAGNOSIS — E039 Hypothyroidism, unspecified: Secondary | ICD-10-CM | POA: Diagnosis not present

## 2018-05-22 DIAGNOSIS — E782 Mixed hyperlipidemia: Secondary | ICD-10-CM | POA: Diagnosis not present

## 2018-06-13 DIAGNOSIS — Z23 Encounter for immunization: Secondary | ICD-10-CM | POA: Diagnosis not present

## 2018-07-29 ENCOUNTER — Ambulatory Visit (INDEPENDENT_AMBULATORY_CARE_PROVIDER_SITE_OTHER): Payer: Medicare Other | Admitting: Family Medicine

## 2018-07-29 ENCOUNTER — Encounter: Payer: Self-pay | Admitting: Family Medicine

## 2018-07-29 VITALS — BP 122/82 | HR 55 | Temp 98.6°F | Ht 62.0 in | Wt 142.6 lb

## 2018-07-29 DIAGNOSIS — Z13820 Encounter for screening for osteoporosis: Secondary | ICD-10-CM | POA: Diagnosis not present

## 2018-07-29 DIAGNOSIS — Z780 Asymptomatic menopausal state: Secondary | ICD-10-CM | POA: Diagnosis not present

## 2018-07-29 DIAGNOSIS — E785 Hyperlipidemia, unspecified: Secondary | ICD-10-CM

## 2018-07-29 DIAGNOSIS — I10 Essential (primary) hypertension: Secondary | ICD-10-CM | POA: Diagnosis not present

## 2018-07-29 DIAGNOSIS — K219 Gastro-esophageal reflux disease without esophagitis: Secondary | ICD-10-CM | POA: Diagnosis not present

## 2018-07-29 DIAGNOSIS — R0982 Postnasal drip: Secondary | ICD-10-CM | POA: Diagnosis not present

## 2018-07-29 DIAGNOSIS — Z9861 Coronary angioplasty status: Secondary | ICD-10-CM | POA: Diagnosis not present

## 2018-07-29 DIAGNOSIS — H43811 Vitreous degeneration, right eye: Secondary | ICD-10-CM

## 2018-07-29 DIAGNOSIS — M1990 Unspecified osteoarthritis, unspecified site: Secondary | ICD-10-CM | POA: Diagnosis not present

## 2018-07-29 DIAGNOSIS — E039 Hypothyroidism, unspecified: Secondary | ICD-10-CM | POA: Diagnosis not present

## 2018-07-29 DIAGNOSIS — J4520 Mild intermittent asthma, uncomplicated: Secondary | ICD-10-CM | POA: Diagnosis not present

## 2018-07-29 MED ORDER — MELOXICAM 7.5 MG PO TABS: 7.5000 mg | ORAL_TABLET | Freq: Every day | ORAL | 1 refills | Status: DC

## 2018-07-29 MED ORDER — BUDESONIDE 3 MG PO CPEP: 3.0000 mg | ORAL_CAPSULE | Freq: Every day | ORAL | 1 refills | Status: DC

## 2018-07-29 MED ORDER — LORATADINE 10 MG PO TABS: 10.0000 mg | ORAL_TABLET | Freq: Every day | ORAL | 1 refills | Status: DC

## 2018-07-29 MED ORDER — POTASSIUM CHLORIDE CRYS ER 10 MEQ PO TBCR: 10.0000 meq | EXTENDED_RELEASE_TABLET | Freq: Two times a day (BID) | ORAL | 1 refills | Status: DC

## 2018-07-29 MED ORDER — MONTELUKAST SODIUM 10 MG PO TABS: 10.0000 mg | ORAL_TABLET | Freq: Every day | ORAL | 1 refills | Status: DC

## 2018-07-29 MED ORDER — OMEPRAZOLE 40 MG PO CPDR: 40.0000 mg | DELAYED_RELEASE_CAPSULE | Freq: Every day | ORAL | 1 refills | Status: DC

## 2018-07-29 MED ORDER — NIFEDIPINE ER 60 MG PO TB24: 60.0000 mg | ORAL_TABLET | Freq: Every day | ORAL | 1 refills | Status: DC

## 2018-07-29 MED ORDER — LEVOTHYROXINE SODIUM 88 MCG PO TABS: 88.0000 ug | ORAL_TABLET | Freq: Every day | ORAL | 1 refills | Status: DC

## 2018-07-29 MED ORDER — SIMVASTATIN 20 MG PO TABS: 20.0000 mg | ORAL_TABLET | Freq: Every day | ORAL | 1 refills | Status: DC

## 2018-07-29 MED ORDER — ENALAPRIL MALEATE 20 MG PO TABS: 20.0000 mg | ORAL_TABLET | Freq: Every day | ORAL | 1 refills | Status: DC

## 2018-07-29 MED ORDER — ATENOLOL 25 MG PO TABS: 25.0000 mg | ORAL_TABLET | Freq: Every day | ORAL | 1 refills | Status: DC

## 2018-07-29 MED ORDER — ALBUTEROL SULFATE HFA 108 (90 BASE) MCG/ACT IN AERS: 2.0000 | INHALATION_SPRAY | RESPIRATORY_TRACT | 5 refills | Status: DC | PRN

## 2018-07-29 MED ORDER — CHLORTHALIDONE 25 MG PO TABS: 25.0000 mg | ORAL_TABLET | Freq: Every day | ORAL | 1 refills | Status: DC

## 2018-07-29 MED ORDER — CLOPIDOGREL BISULFATE 75 MG PO TABS: 75.0000 mg | ORAL_TABLET | Freq: Every day | ORAL | 1 refills | Status: DC

## 2018-07-29 NOTE — Patient Instructions (Signed)
Great to meet you!  Follow up in 3 months, we will do labs at next visit

## 2018-07-29 NOTE — Progress Notes (Signed)
Subjective:    Patient ID: Andrea Hart, female    DOB: 22-Mar-1936, 82 y.o.   MRN: [id number]  HPI  Presents to clinic to establish care as a new patient.  She was living in [place], but recently moved to area to be with daughter.  Past medical history reviewed and problem list updated.  Patient reports she had blood work approximately 2 months ago, we will have her sign record release so we can see these results.  She has never had a DEXA scan is agreeable to do so.  She is unsure of her last mammogram and declines getting a mammogram at this time.  Due to her cardiac history, she requires a new cardiology referral. Has hx of cardiac stent and currently has loop recorder in place.  Also due to her history of asthma, she would like a pulmonology referral.  Currently uses albuterol as needed and Singulair daily.  Notes she has felt much more congested than usual.  Denies any wheezing or feeling short of breath.  Patient and daughter also requesting new ophthalmologist referral due to history of eye issues.  Patient Active Problem List   Diagnosis Date Noted  . Post-menopausal 07/29/2018  . H/O coronary angioplasty 07/29/2018  . Osteoarthritis 07/29/2018  . Post-nasal drip 07/29/2018  . Gastroesophageal reflux disease without esophagitis 07/29/2018  . Hypothyroidism 05/20/2018  . Anemia 05/20/2018  . Essential hypertension 05/20/2018  . Mild intermittent asthma 05/20/2018  . Hyperlipidemia 05/20/2018  . Dry eye syndrome, bilateral 08/09/2017  . Pseudophakia, both eyes 06/29/2017  . PVD (posterior vitreous detachment), right 06/29/2017  . Macular retinal edema 06/26/2017   Social History   Tobacco Use  . Smoking status: Former Smoker  Substance Use Topics  . Alcohol use: Yes    Comment: Occasional    Review of Systems  Constitutional: Negative for chills, fatigue and fever.  HENT:+congestion. No ear pain, sinus pain and sore throat.   Eyes: Negative.   Respiratory:  Negative for cough, shortness of breath and wheezing.   Cardiovascular: Negative for chest pain, palpitations and leg swelling.  Gastrointestinal: Negative for abdominal pain, diarrhea, nausea and vomiting.  Genitourinary: Negative for dysuria, frequency and urgency.  Musculoskeletal: Negative for arthralgias and myalgias.  Skin: Negative for color change, pallor and rash.  Neurological: Negative for syncope, light-headedness and headaches.  Psychiatric/Behavioral: The patient is not nervous/anxious.    Objective:   Physical Exam  Constitutional: She is oriented to person, place, and time. She appears well-developed and well-nourished. No distress.  Head: Normocephalic and atraumatic.  Nose/throat: +post nasal drip Eyes: Pupils are equal, round, and reactive to light. EOM are normal. No scleral icterus.  Neck: Normal range of motion. Neck supple. No tracheal deviation present.  Cardiovascular: Normal rate, regular rhythm and normal heart sounds.  Pulmonary/Chest: Effort normal and breath sounds normal. No respiratory distress. She has no wheezes. She has no rales.  Abdominal: Soft. Bowel sounds are normal. There is no tenderness.  Neurological: She is alert and oriented to person, place, and time.  Gait normal  Skin: Skin is warm and dry. No pallor.  Psychiatric: She has a normal mood and affect. Her behavior is normal. Thought content normal.   Nursing note and vitals reviewed.  Vitals:   07/29/18 1110  BP: 122/82  Pulse: (!) 55  Temp: 98.6 F (37 C)  SpO2: 97%      Assessment & Plan:   Patient given prescription refills for all her medications.  Hypertension/history of  coronary angioplasty- patient is stable on current medication regimen.  Medication refills given.  New referral to cardiology in the local area placed.  Hypothyroidism-patient stable on current medication dose.  Hyperlipidemia-patient stable on current statin.  GERD-patient stable on current PPI  medication.  Asthma- patient will continue albuterol as needed and Singulair daily.  She will also add Claritin in the a.m. to better control postnasal drip/congestion symptoms that can trigger worsening asthma symptoms. Referral to pulmonology given.  Osteoarthritis- patient will continue daily meloxicam for joint pain control.  History of posterior vitreous detachment- patient given referral to local ophthalmologist.  Postmenopausal/screening for osteoporosis - patient agreeable to have DEXA scan for bone density screening.  Patient declines any lab work at this time.  She is agreeable to follow-up appointment in 3 months with new lab work at that time.

## 2018-08-07 DIAGNOSIS — I639 Cerebral infarction, unspecified: Secondary | ICD-10-CM | POA: Diagnosis not present

## 2018-08-09 DIAGNOSIS — I639 Cerebral infarction, unspecified: Secondary | ICD-10-CM | POA: Diagnosis not present

## 2018-08-12 ENCOUNTER — Institutional Professional Consult (permissible substitution): Payer: No Typology Code available for payment source | Admitting: Internal Medicine

## 2018-08-20 ENCOUNTER — Ambulatory Visit (INDEPENDENT_AMBULATORY_CARE_PROVIDER_SITE_OTHER): Payer: Medicare Other | Admitting: Internal Medicine

## 2018-08-20 ENCOUNTER — Encounter: Payer: Self-pay | Admitting: Internal Medicine

## 2018-08-20 VITALS — BP 130/80 | HR 60 | Ht 62.0 in | Wt 142.0 lb

## 2018-08-20 DIAGNOSIS — Z23 Encounter for immunization: Secondary | ICD-10-CM | POA: Diagnosis not present

## 2018-08-20 DIAGNOSIS — J4520 Mild intermittent asthma, uncomplicated: Secondary | ICD-10-CM | POA: Diagnosis not present

## 2018-08-20 NOTE — Patient Instructions (Signed)
You can stop Breo to see if you notice a difference, if you feel your breathing gets worse restart. If not stay off of it, and use the proair as needed.

## 2018-08-20 NOTE — Progress Notes (Signed)
[facility] Consultation      Assessment and Plan:  Asthma. -Mild intermittent asthma, occasional symptoms, doubtful that her recent episode of dyspnea was due to asthma. - Can try stopping Breo, if her symptoms do not worsen she can remain off of it.  If they do worsen she should remain on it. - Discussed flu vaccine, she will get it at the pharmacy.  GERD/Reflux.  -Occasional reflux symptoms. - Reflux can contribute to asthma therefore it is important to control the symptoms with continued use of her PPI, as well as lifestyle modifications.  Snoring. - Snoring present, though does not appear to have excessive daytime sleepiness. -We will continue to monitor if symptoms progress will consider sending for sleep study.  Return in about 1 year (around 08/21/2019).    Date: 08/20/2018  MRN# [id number] Andrea Hart 02/17/1936  Referring Physician: NP Guse for asthma.   Andrea Hart is a 82 y.o. old female seen in consultation for chief complaint of:    Chief Complaint  Patient presents with  . Consult    wants to establish care  . Asthma    chest tightness recently:     HPI:   She has a history of asthma, she noted that about 5 weeks ago she developed right side chest tightness and developed dyspnea at that time, she thought that she had pleurisy, but it went away in 3-4 days, she is now back to normal. She has a history of asthma.  She saw a lung doctor in [place], she was prescribed Breo but did not do it after she was doing better. She restarted Breo after her recent bout of dyspnea.  She has 1 cat, mostly not in her place. She takes singulair at night.  She has reflux and is controlled by prilosec. She has a hiatal hernia she had a surgery about 2 years ago. She takes plavix for CAD.   She drives a car, she can walk a store, she is not limited by breathing. She worked in the yard yesterday for about 3 hours and was not limited.   She snores at night, she  is mildly sleepy during the day, she naps on most days.     PMHX:   CAD.  GERD.  Asthma.  Essential hypertension.  Hypercholesterolemia.   Surgical Hx:  Past Surgical History:  Procedure Laterality Date  . CORONARY ANGIOPLASTY    . LOOP RECORDER IMPLANT     Family Hx:  History reviewed. No pertinent family history.    Social Hx:   Social History   Tobacco Use  . Smoking status: never Smoker  . Smokeless tobacco: Never Used  Substance Use Topics  . Alcohol use: Yes    Comment: Occasional  . Drug use: Never   Medication:    Current Outpatient Medications:  .  albuterol (PROVENTIL HFA;VENTOLIN HFA) 108 (90 Base) MCG/ACT inhaler, Inhale 2 puffs into the lungs every 4 (four) hours as needed for wheezing or shortness of breath., Disp: 1 Inhaler, Rfl: 5 .  atenolol (TENORMIN) 25 MG tablet, Take 1 tablet (25 mg total) by mouth daily., Disp: 90 tablet, Rfl: 1 .  budesonide (ENTOCORT EC) 3 MG 24 hr capsule, Take 1 capsule (3 mg total) by mouth daily., Disp: 90 capsule, Rfl: 1 .  chlorthalidone (HYGROTON) 25 MG tablet, Take 1 tablet (25 mg total) by mouth daily., Disp: 90 tablet, Rfl: 1 .  clopidogrel (PLAVIX) 75 MG tablet, Take 1 tablet (75 mg total) by  mouth daily., Disp: 90 tablet, Rfl: 1 .  enalapril (VASOTEC) 20 MG tablet, Take 1 tablet (20 mg total) by mouth daily., Disp: 90 tablet, Rfl: 1 .  fluticasone furoate-vilanterol (BREO ELLIPTA) 200-25 MCG/INH AEPB, Inhale 1 puff into the lungs daily., Disp: , Rfl:  .  levothyroxine (SYNTHROID, LEVOTHROID) 88 MCG tablet, Take 1 tablet (88 mcg total) by mouth daily before breakfast., Disp: 90 tablet, Rfl: 1 .  loratadine (CLARITIN) 10 MG tablet, Take 1 tablet (10 mg total) by mouth daily., Disp: 90 tablet, Rfl: 1 .  meloxicam (MOBIC) 7.5 MG tablet, Take 1 tablet (7.5 mg total) by mouth daily., Disp: 90 tablet, Rfl: 1 .  montelukast (SINGULAIR) 10 MG tablet, Take 1 tablet (10 mg total) by mouth at bedtime., Disp: 90 tablet, Rfl: 1 .   Multiple Vitamin (MULTIVITAMIN) capsule, Take 1 capsule by mouth daily., Disp: , Rfl:  .  NIFEdipine (PROCARDIA-XL/ADALAT CC) 60 MG 24 hr tablet, Take 1 tablet (60 mg total) by mouth daily., Disp: 90 tablet, Rfl: 1 .  omeprazole (PRILOSEC) 40 MG capsule, Take 1 capsule (40 mg total) by mouth daily., Disp: 90 capsule, Rfl: 1 .  potassium chloride (K-DUR,KLOR-CON) 10 MEQ tablet, Take 1 tablet (10 mEq total) by mouth 2 (two) times daily., Disp: 180 tablet, Rfl: 1 .  simvastatin (ZOCOR) 20 MG tablet, Take 1 tablet (20 mg total) by mouth daily., Disp: 90 tablet, Rfl: 1   Allergies:  Patient has no known allergies.  Review of Systems: Gen:  Denies  fever, sweats, chills HEENT: Denies blurred vision, double vision. bleeds, sore throat Cvc:  No dizziness, chest pain. Resp:   Denies cough or sputum production, shortness of breath Gi: Denies swallowing difficulty, stomach pain. Gu:  Denies bladder incontinence, burning urine Ext:   No Joint pain, stiffness. Skin: No skin rash,  hives  Endoc:  No polyuria, polydipsia. Psych: No depression, insomnia. Other:  All other systems were reviewed with the patient and were negative other that what is mentioned in the HPI.   Physical Examination:   VS: BP 130/80 (BP Location: Left Arm, Cuff Size: Normal)   Pulse 60   Ht 5\' 2"  (1.575 m)   Wt 142 lb (64.4 kg)   SpO2 96%   BMI 25.97 kg/m   General Appearance: No distress  Neuro:without focal findings,  speech normal,  HEENT: PERRLA, EOM intact.   Pulmonary: normal breath sounds, No wheezing.  CardiovascularNormal S1,S2.  No m/r/g.   Abdomen: Benign, Soft, non-tender. Renal:  No costovertebral tenderness  GU:  No performed at this time. Endoc: No evident thyromegaly, no signs of acromegaly. Skin:   warm, no rashes, no ecchymosis  Extremities: normal, no cyanosis, clubbing.  Other findings:    LABORATORY PANEL:   CBC No results for input(s): WBC, HGB, HCT, PLT in the last 168  hours. ------------------------------------------------------------------------------------------------------------------  Chemistries  No results for input(s): NA, K, CL, CO2, GLUCOSE, BUN, CREATININE, CALCIUM, MG, AST, ALT, ALKPHOS, BILITOT in the last 168 hours.  Invalid input(s): GFRCGP ------------------------------------------------------------------------------------------------------------------  Cardiac Enzymes No results for input(s): TROPONINI in the last 168 hours. ------------------------------------------------------------  RADIOLOGY:  No results found.     Thank  you for the consultation and for allowing [facility] to assist in the care of your patient. Our recommendations are noted above.  Please contact us if we can be of further service.   Marda Stalker, M.D., F.C.C.P.  Board Certified in Internal Medicine, Pulmonary Medicine, [place], and Sleep  Medicine.  [facility] Office Number: [phone]   08/20/2018

## 2018-08-27 ENCOUNTER — Telehealth: Payer: Self-pay

## 2018-08-27 DIAGNOSIS — Z780 Asymptomatic menopausal state: Secondary | ICD-10-CM

## 2018-08-27 DIAGNOSIS — Z9861 Coronary angioplasty status: Secondary | ICD-10-CM

## 2018-08-27 DIAGNOSIS — E785 Hyperlipidemia, unspecified: Secondary | ICD-10-CM

## 2018-08-27 DIAGNOSIS — I10 Essential (primary) hypertension: Secondary | ICD-10-CM

## 2018-08-27 NOTE — Telephone Encounter (Signed)
Called Pt to tell her that her referrals were put in and someone will be calling her with some appts. She said okay and had no questions

## 2018-08-27 NOTE — Addendum Note (Signed)
Addended by: Philis Nettle on: 08/27/2018 12:06 PM   Modules accepted: Orders

## 2018-08-27 NOTE — Telephone Encounter (Signed)
Copied from [place] [id number]. Topic: General - Other >> Aug 27, 2018 10:06 AM Carolyn Stare wrote:  Pt call to say she need a referral to see a cardiologist and a gynecologist. She said she had both of these type of specialist when in [place] and she still need to see these type of doctors

## 2018-08-27 NOTE — Telephone Encounter (Signed)
Referrals are in  [facility]

## 2018-08-30 ENCOUNTER — Encounter: Payer: Self-pay | Admitting: Family Medicine

## 2018-09-05 ENCOUNTER — Telehealth: Payer: Self-pay | Admitting: Family Medicine

## 2018-09-05 DIAGNOSIS — K449 Diaphragmatic hernia without obstruction or gangrene: Secondary | ICD-10-CM

## 2018-09-05 NOTE — Telephone Encounter (Signed)
Checking on referral for Cardiology and would like a referral for GI

## 2018-09-05 NOTE — Telephone Encounter (Signed)
Cardiology and GYN referrals were placed by me on 08/27/18  GI referral placed today  I will forward to [facility] about appt statuses for referrals

## 2018-09-05 NOTE — Telephone Encounter (Signed)
Copied from [place] [id number]. Topic: Referral - Request >> Sep 05, 2018  9:24 AM Burchel, Abbi R wrote: CRM for notification. See Telephone encounter for: 09/05/18.  Pt requesting a referral to Gastro (pt has hx of hiatal hernia).   Pt also would like to let Lauren know she has not heard from anyone re: Cardiology referral.   Pt: [phone]

## 2018-09-09 ENCOUNTER — Ambulatory Visit (INDEPENDENT_AMBULATORY_CARE_PROVIDER_SITE_OTHER): Payer: Medicare Other | Admitting: Obstetrics and Gynecology

## 2018-09-09 ENCOUNTER — Encounter: Payer: Self-pay | Admitting: Obstetrics and Gynecology

## 2018-09-09 VITALS — BP 124/78 | Ht 62.0 in | Wt 142.0 lb

## 2018-09-09 DIAGNOSIS — N816 Rectocele: Secondary | ICD-10-CM

## 2018-09-09 NOTE — Progress Notes (Signed)
Patient ID: Andrea Hart, female   DOB: 1936-10-26, 82 y.o.   MRN: [id number]  Reason for Consult: Referral   Referred by Jodelle Green, FNP  Subjective:     HPI:  Andrea Hart is a 82 y.o. female She reports symptoms of a vaginal buldge   Past Medical History:  Diagnosis Date  . Arthritis   . Asthma   . Coronary artery disease    a. 2013 s/p PCI and stent placement in [facility] done for stable angina.  . Hyperlipidemia   . Hypertension   . Hypothyroidism   . Stroke [facility])    a. 2017 - ILR did not show afib.   Family History  Problem Relation Age of Onset  . Heart Problems Mother   . Heart attack Mother   . Heart Problems Father   . Heart attack Brother   . Heart Problems Brother   . Colon cancer Neg Hx   . Breast cancer Neg Hx    Past Surgical History:  Procedure Laterality Date  . APPENDECTOMY    . CATARACT EXTRACTION Bilateral   . CHOLECYSTECTOMY    . CORONARY ANGIOPLASTY  2013   1xStent [facility] [place].  [facility] Kitchen EYE SURGERY    . HAND SURGERY Right   . LOOP RECORDER IMPLANT    . NISSEN FUNDOPLICATION  0347  . REPLACEMENT TOTAL KNEE Bilateral 2017  . TOTAL HIP ARTHROPLASTY Right   . TOTAL VAGINAL HYSTERECTOMY      Short Social History:  Social History   Tobacco Use  . Smoking status: Never Smoker  . Smokeless tobacco: Never Used  Substance Use Topics  . Alcohol use: Not Currently    Comment: Occasional    Allergies  Allergen Reactions  . Lidocaine Rash    Current Outpatient Medications  Medication Sig Dispense Refill  . albuterol (VENTOLIN HFA) 108 (90 Base) MCG/ACT inhaler Inhale 2 puffs into the lungs every 4 (four) hours as needed for wheezing or shortness of breath. 6.7 g 1  . amLODipine (NORVASC) 10 MG tablet Take 1 tablet (10 mg total) by mouth daily. 90 tablet 3  . aspirin EC 81 MG tablet Take 81 mg by mouth daily.    . carvedilol (COREG) 12.5 MG tablet Take 1 tablet (12.5 mg total) by mouth 2 (two) times daily. 180 tablet 1   . cetirizine (ZYRTEC) 10 MG tablet Take 1 tablet (10 mg total) by mouth daily. 90 tablet 3  . chlorthalidone (HYGROTON) 25 MG tablet Take 1 tablet (25 mg) by mouth once daily in the morning 90 tablet 1  . clopidogrel (PLAVIX) 75 MG tablet Take 1 tablet (75 mg total) by mouth daily. 90 tablet 1  . enalapril (VASOTEC) 10 MG tablet Take 1 tablet (10 mg total) by mouth 2 (two) times daily. 180 tablet 1  . Fluticasone-Salmeterol (ADVAIR) 500-50 MCG/DOSE AEPB Inhale 1 puff into the lungs 2 (two) times daily. 180 each 3  . levothyroxine (SYNTHROID) 88 MCG tablet Take 1 tablet (88 mcg total) by mouth daily before breakfast. 90 tablet 1  . montelukast (SINGULAIR) 10 MG tablet Take 1 tablet (10 mg total) by mouth at bedtime. 90 tablet 3  . omeprazole (PRILOSEC) 40 MG capsule Take 1 capsule (40 mg total) by mouth 2 (two) times daily. 180 capsule 3  . potassium chloride (KLOR-CON) 10 MEQ tablet Take 2 tablets (20 mEq total) by mouth 2 (two) times daily. 360 tablet 3  . rosuvastatin (CRESTOR) 20 MG tablet Take  1 tablet (20 mg total) by mouth daily. 90 tablet 1  . Turmeric 500 MG TABS Take 1,000 mg by mouth daily.     No current facility-administered medications for this visit.    REVIEW OF SYSTEMS      Objective:  Objective   Vitals:   09/09/18 1535  BP: 124/78  Weight: 142 lb (64.4 kg)  Height: 5\' 2"  (1.575 m)   Body mass index is 25.97 kg/m.  Physical Exam  Assessment/Plan:    Andrea Hart is a 82 y.o. [id number] with rectocele prolapse We discussed how her diagnosis of prolapse and she was show illustrations of her prolapse. We discussed that prolapse is generally not life threatening but can cause difficulty with urination and bowel movements. This includes urinary retension and constipation. We discussed that if prolapse is bothersome a finger can be used to gently replace any prolapsing tissue into the vagina. We discussed treatment option for prolapse including pelvic floor physical  therapy, pessary, and pelvic surgery. At this time the patient opts for no management of her prolapse. She will plan to follow up as desired . She was provided with handouts from [facility] regarding prolapse.   Adrian Prows MD, [facility], [facility] 03/27/2021 12:29 PM

## 2018-09-11 DIAGNOSIS — G453 Amaurosis fugax: Secondary | ICD-10-CM | POA: Diagnosis not present

## 2018-09-17 ENCOUNTER — Other Ambulatory Visit: Payer: Self-pay | Admitting: Lab

## 2018-09-17 DIAGNOSIS — K219 Gastro-esophageal reflux disease without esophagitis: Secondary | ICD-10-CM | POA: Diagnosis not present

## 2018-09-17 DIAGNOSIS — Z9889 Other specified postprocedural states: Secondary | ICD-10-CM | POA: Diagnosis not present

## 2018-09-17 DIAGNOSIS — M1990 Unspecified osteoarthritis, unspecified site: Secondary | ICD-10-CM

## 2018-09-17 DIAGNOSIS — I10 Essential (primary) hypertension: Secondary | ICD-10-CM

## 2018-09-17 DIAGNOSIS — R140 Abdominal distension (gaseous): Secondary | ICD-10-CM | POA: Diagnosis not present

## 2018-09-17 MED ORDER — POTASSIUM CHLORIDE CRYS ER 10 MEQ PO TBCR: 20.0000 meq | EXTENDED_RELEASE_TABLET | Freq: Two times a day (BID) | ORAL | 1 refills | Status: DC

## 2018-09-17 MED ORDER — OMEPRAZOLE 40 MG PO CPDR: 40.0000 mg | DELAYED_RELEASE_CAPSULE | Freq: Two times a day (BID) | ORAL | 1 refills | Status: DC

## 2018-09-17 MED ORDER — MELOXICAM 7.5 MG PO TABS: 7.5000 mg | ORAL_TABLET | Freq: Two times a day (BID) | ORAL | 1 refills | Status: DC

## 2018-09-26 ENCOUNTER — Ambulatory Visit: Payer: Medicare Other | Admitting: Obstetrics and Gynecology

## 2018-10-28 ENCOUNTER — Ambulatory Visit (INDEPENDENT_AMBULATORY_CARE_PROVIDER_SITE_OTHER): Payer: Medicare Other | Admitting: Family Medicine

## 2018-10-28 ENCOUNTER — Encounter: Payer: Self-pay | Admitting: Family Medicine

## 2018-10-28 VITALS — BP 122/82 | HR 57 | Temp 97.6°F | Ht 62.0 in | Wt 142.6 lb

## 2018-10-28 DIAGNOSIS — J4520 Mild intermittent asthma, uncomplicated: Secondary | ICD-10-CM

## 2018-10-28 DIAGNOSIS — K219 Gastro-esophageal reflux disease without esophagitis: Secondary | ICD-10-CM

## 2018-10-28 DIAGNOSIS — E785 Hyperlipidemia, unspecified: Secondary | ICD-10-CM | POA: Diagnosis not present

## 2018-10-28 DIAGNOSIS — I10 Essential (primary) hypertension: Secondary | ICD-10-CM

## 2018-10-28 DIAGNOSIS — E039 Hypothyroidism, unspecified: Secondary | ICD-10-CM | POA: Diagnosis not present

## 2018-10-28 LAB — COMPREHENSIVE METABOLIC PANEL
ALK PHOS: 59 U/L (ref 39–117)
ALT: 15 U/L (ref 0–35)
AST: 20 U/L (ref 0–37)
Albumin: 4.3 g/dL (ref 3.5–5.2)
BILIRUBIN TOTAL: 0.5 mg/dL (ref 0.2–1.2)
BUN: 22 mg/dL (ref 6–23)
CALCIUM: 9.6 mg/dL (ref 8.4–10.5)
CO2: 25 mEq/L (ref 19–32)
Chloride: 106 mEq/L (ref 96–112)
Creatinine, Ser: 1.16 mg/dL (ref 0.40–1.20)
GFR: 47.75 mL/min — AB (ref >60.00)
GLUCOSE: 127 mg/dL — AB (ref 70–99)
Potassium: 4.4 mEq/L (ref 3.5–5.1)
Sodium: 138 mEq/L (ref 135–145)
TOTAL PROTEIN: 7.1 g/dL (ref 6.0–8.3)

## 2018-10-28 LAB — LIPID PANEL
CHOLESTEROL: 172 mg/dL (ref 0–200)
HDL: 64.1 mg/dL (ref >39.00)
LDL Cholesterol: 80 mg/dL (ref 0–99)
NONHDL: 108.3
TRIGLYCERIDES: 142 mg/dL (ref 0.0–149.0)
Total CHOL/HDL Ratio: 3
VLDL: 28.4 mg/dL (ref 0.0–40.0)

## 2018-10-28 LAB — CBC WITH DIFFERENTIAL/PLATELET
Basophils Absolute: 0.1 10*3/uL (ref 0.0–0.1)
Basophils Relative: 1 % (ref 0.0–3.0)
EOS PCT: 9.5 % — AB (ref 0.0–5.0)
Eosinophils Absolute: 0.6 10*3/uL (ref 0.0–0.7)
HCT: 34.9 % — ABNORMAL LOW (ref 36.0–46.0)
Hemoglobin: 11.7 g/dL — ABNORMAL LOW (ref 12.0–15.0)
LYMPHS ABS: 0.9 10*3/uL (ref 0.7–4.0)
Lymphocytes Relative: 14 % (ref 12.0–46.0)
MCHC: 33.4 g/dL (ref 30.0–36.0)
MCV: 91.1 fl (ref 78.0–100.0)
MONOS PCT: 7.7 % (ref 3.0–12.0)
Monocytes Absolute: 0.5 10*3/uL (ref 0.1–1.0)
NEUTROS ABS: 4.6 10*3/uL (ref 1.4–7.7)
NEUTROS PCT: 67.8 % (ref 43.0–77.0)
Platelets: 248 10*3/uL (ref 150.0–400.0)
RBC: 3.83 Mil/uL — AB (ref 3.87–5.11)
RDW: 13.4 % (ref 11.5–15.5)
WBC: 6.7 10*3/uL (ref 4.0–10.5)

## 2018-10-28 LAB — MAGNESIUM: Magnesium: 1.8 mg/dL (ref 1.5–2.5)

## 2018-10-28 MED ORDER — FLUTICASONE-SALMETEROL 500-50 MCG/DOSE IN AEPB: 1.0000 | INHALATION_SPRAY | Freq: Two times a day (BID) | RESPIRATORY_TRACT | 5 refills | Status: DC

## 2018-10-28 NOTE — Progress Notes (Signed)
Subjective:    Patient ID: Andrea Hart, female    DOB: 05-Oct-1936, 82 y.o.   MRN: [id number]  HPI  Patient presents to clinic for 43-month follow-up asthma, lipids, hypertension, hypothyroidism, GERD.  We will get new blood work today, patient had gotten lab work at her previous PCP prior to moving to the area.  Overall she is feeling well.  Blood pressure has been well controlled.  She has been tolerating her statin for hyperlipidemia control.  Her energy level has been stable on current levothyroxine dose.  Does report that this time a year she notices herself slightly more wheezy, so will use her Advair usually for the winter, then will come off of Advair for the springtime and summer.  Patient does take her Claritin and Singulair regularly also to keep allergy asthma triggers under control.  GERD is stable on Prilosec dose.  Patient Active Problem List   Diagnosis Date Noted  . Post-menopausal 07/29/2018  . H/O coronary angioplasty 07/29/2018  . Osteoarthritis 07/29/2018  . Post-nasal drip 07/29/2018  . Gastroesophageal reflux disease without esophagitis 07/29/2018  . Hypothyroidism 05/20/2018  . Anemia 05/20/2018  . Essential hypertension 05/20/2018  . Mild intermittent asthma 05/20/2018  . Hyperlipidemia 05/20/2018  . Dry eye syndrome, bilateral 08/09/2017  . Pseudophakia, both eyes 06/29/2017  . PVD (posterior vitreous detachment), right 06/29/2017  . Macular retinal edema 06/26/2017   Social History   Tobacco Use  . Smoking status: Former Research scientist (life sciences)  . Smokeless tobacco: Never Used  Substance Use Topics  . Alcohol use: Yes    Comment: Occasional   Review of Systems  Constitutional: Negative for chills, fatigue and fever.  HENT: Negative for congestion, ear pain, sinus pain and sore throat.   Eyes: Negative.   Respiratory: Negative for cough, shortness of breath and wheezing.   Cardiovascular: Negative for chest pain, palpitations and leg swelling.  Gastrointestinal:  Negative for abdominal pain, diarrhea, nausea and vomiting.  Genitourinary: Negative for dysuria, frequency and urgency.  Musculoskeletal: Negative for arthralgias and myalgias.  Skin: Negative for color change, pallor and rash.  Neurological: Negative for syncope, light-headedness and headaches.  Psychiatric/Behavioral: The patient is not nervous/anxious.       Objective:   Physical Exam  Constitutional: She is oriented to person, place, and time. She appears well-nourished. No distress.  HENT:  Head: Normocephalic and atraumatic.  Eyes: Conjunctivae and EOM are normal. No scleral icterus.  Neck: Neck supple. No JVD present. No tracheal deviation present.  Cardiovascular: Normal rate and regular rhythm.  Pulmonary/Chest: Effort normal and breath sounds normal. No respiratory distress. She has no wheezes. She has no rales.  Abdominal: Soft. Bowel sounds are normal. She exhibits no distension. There is no tenderness.  Musculoskeletal: Normal range of motion. She exhibits no edema.  Neurological: She is alert and oriented to person, place, and time.  Skin: Skin is warm and dry. Capillary refill takes less than 2 seconds. No pallor.  Psychiatric: She has a normal mood and affect. Her behavior is normal.  Nursing note and vitals reviewed.     Vitals:   10/28/18 0841  BP: 122/82  Pulse: (!) 57  Temp: 97.6 F (36.4 C)  SpO2: 98%   Assessment & Plan:    Mild intermittent asthma-patient Advair refill given.  Patient has albuterol inhaler at home to use if needed.   Hyperlipidemia- patient tolerating simvastatin.  We will get new lipid panel labs today.  Essential hypertension-blood pressure very well controlled on current regimen  of medication, we will get new CMP in clinic today.  Hypothyroidism- tolerating levothyroxine 88 mcg dose very well, we will continue thyroid panel in clinic today.  GERD -  symptoms controlled on omeprazole.  We will get magnesium level due to long-term  PPI treatment.  Patient will follow-up in approximately 6 months for management of chronic conditions.  Patient is aware she can return to clinic sooner if any issues arise.  Patient has upcoming appointment with cardiology in December 2019.

## 2018-10-29 ENCOUNTER — Encounter: Payer: Self-pay | Admitting: Lab

## 2018-10-29 LAB — THYROID PANEL WITH TSH
FREE THYROXINE INDEX: 2.8 (ref 1.4–3.8)
T3 Uptake: 27 % (ref 22–35)
T4 TOTAL: 10.5 ug/dL (ref 5.1–11.9)
TSH: 1.57 m[IU]/L (ref 0.40–4.50)

## 2018-11-12 ENCOUNTER — Ambulatory Visit (INDEPENDENT_AMBULATORY_CARE_PROVIDER_SITE_OTHER): Payer: Medicare Other | Admitting: Cardiovascular Disease

## 2018-11-12 ENCOUNTER — Encounter: Payer: Self-pay | Admitting: Cardiovascular Disease

## 2018-11-12 VITALS — BP 156/96 | HR 64 | Ht 62.0 in | Wt 142.5 lb

## 2018-11-12 DIAGNOSIS — I10 Essential (primary) hypertension: Secondary | ICD-10-CM | POA: Diagnosis not present

## 2018-11-12 DIAGNOSIS — I2510 Atherosclerotic heart disease of native coronary artery without angina pectoris: Secondary | ICD-10-CM | POA: Diagnosis not present

## 2018-11-12 DIAGNOSIS — E785 Hyperlipidemia, unspecified: Secondary | ICD-10-CM

## 2018-11-12 NOTE — Progress Notes (Signed)
Cardiology Office Note   Date:  11/12/2018   ID:  Andrea Hart, DOB Sep 03, 1936, MRN [id number]  PCP:  Jodelle Green, FNP  Cardiologist:   Kathlyn Sacramento, MD   Chief Complaint  Patient presents with  . other    C/o rapid heart beat Hx coronary angioplasty, hyperlidemia and hypertension. Meds reviewed verbally with pt.      History of Present Illness: Andrea Hart is a 82 y.o. female who presents to establish cardiovascular care.  She moved from [place].  She has known history of coronary artery disease with previous stent placement in 2013 done for stable angina.  She has chronic medical conditions that include asthma, hypertension, hyperlipidemia, hypothyroidism and stroke twice.  She had a loop recorder done for detection of atrial fibrillation.  She is not a smoker and she has family history for coronary artery disease. She has been doing reasonably well with no recent chest pain, shortness of breath or palpitations.  No leg edema.  She reports that her symptoms before PCI included shortness of breath but no chest pain.    Past Medical History:  Diagnosis Date  . Asthma   . Coronary artery disease   . Hyperlipidemia   . Hypertension   . Hypothyroidism   . Stroke [facility])     Past Surgical History:  Procedure Laterality Date  . CATARACT EXTRACTION Bilateral   . CORONARY ANGIOPLASTY  2013   1xStent [facility] [place].  Marland Kitchen HERNIA REPAIR  2016  . LOOP RECORDER IMPLANT    . REPLACEMENT TOTAL KNEE Bilateral 2017  . TOTAL HIP ARTHROPLASTY Right   . TOTAL VAGINAL HYSTERECTOMY       Current Outpatient Medications  Medication Sig Dispense Refill  . albuterol (PROVENTIL HFA;VENTOLIN HFA) 108 (90 Base) MCG/ACT inhaler Inhale 2 puffs into the lungs every 4 (four) hours as needed for wheezing or shortness of breath. 1 Inhaler 5  . aspirin EC 81 MG tablet Take 81 mg by mouth daily.    Marland Kitchen atenolol (TENORMIN) 25 MG tablet Take 1 tablet (25 mg total) by mouth daily. 90 tablet 1    . cetirizine (ZYRTEC) 10 MG tablet Take 10 mg by mouth daily.    . chlorthalidone (HYGROTON) 25 MG tablet Take 1 tablet (25 mg total) by mouth daily. 90 tablet 1  . clopidogrel (PLAVIX) 75 MG tablet Take 1 tablet (75 mg total) by mouth daily. 90 tablet 1  . enalapril (VASOTEC) 20 MG tablet Take 1 tablet (20 mg total) by mouth daily. 90 tablet 1  . FeFum-FePoly-FA-B Cmp-C-Biot (INTEGRA PLUS PO) Take by mouth daily.    . Fluticasone-Salmeterol (ADVAIR DISKUS) 500-50 MCG/DOSE AEPB Inhale 1 puff into the lungs 2 (two) times daily. 60 each 5  . levothyroxine (SYNTHROID, LEVOTHROID) 88 MCG tablet Take 1 tablet (88 mcg total) by mouth daily before breakfast. 90 tablet 1  . meloxicam (MOBIC) 7.5 MG tablet Take 1 tablet (7.5 mg total) by mouth 2 (two) times daily. 90 tablet 1  . montelukast (SINGULAIR) 10 MG tablet Take 1 tablet (10 mg total) by mouth at bedtime. 90 tablet 1  . Multiple Vitamin (MULTIVITAMIN) capsule Take 1 capsule by mouth daily.    Marland Kitchen NIFEdipine (PROCARDIA-XL/ADALAT CC) 60 MG 24 hr tablet Take 1 tablet (60 mg total) by mouth daily. 90 tablet 1  . omeprazole (PRILOSEC) 40 MG capsule Take 1 capsule (40 mg total) by mouth 2 (two) times daily. 90 capsule 1  . potassium chloride (K-DUR,KLOR-CON) 10  MEQ tablet Take 2 tablets (20 mEq total) by mouth 2 (two) times daily. 180 tablet 1  . simvastatin (ZOCOR) 20 MG tablet Take 1 tablet (20 mg total) by mouth daily. 90 tablet 1  . VITAMIN D PO Take by mouth daily.     No current facility-administered medications for this visit.     Allergies:   Patient has no known allergies.    Social History:  The patient  reports that she has quit smoking. She has never used smokeless tobacco. She reports that she drank alcohol. She reports that she does not use drugs.   Family History:  The patient's family history includes Heart Problems in her brother, father, and mother; Heart attack in her brother and mother.    ROS:  Please see the history of  present illness.   Otherwise, review of systems are positive for none.   All other systems are reviewed and negative.    PHYSICAL EXAM: VS:  BP (!) 156/96 (BP Location: Right Arm, Patient Position: Sitting, Cuff Size: Normal)   Pulse 64   Ht 5\' 2"  (1.575 m)   Wt 142 lb 8 oz (64.6 kg)   BMI 26.06 kg/m  , BMI Body mass index is 26.06 kg/m. GEN: Well nourished, well developed, in no acute distress  HEENT: normal  Neck: no JVD, carotid bruits, or masses Cardiac: RRR; no rubs, or gallops,no edema .  1 out of 6 systolic murmur in the aortic area Respiratory:  clear to auscultation bilaterally, normal work of breathing GI: soft, nontender, nondistended, + BS MS: no deformity or atrophy  Skin: warm and dry, no rash Neuro:  Strength and sensation are intact Psych: euthymic mood, full affect   EKG:  EKG is ordered today. The ekg ordered today demonstrates normal sinus rhythm with PVCs.  LVH.   Recent Labs: 10/28/2018: ALT 15; BUN 22; Creatinine, Ser 1.16; Hemoglobin 11.7; Magnesium 1.8; Platelets 248.0; Potassium 4.4; Sodium 138; TSH 1.57    Lipid Panel    Component Value Date/Time   CHOL 172 10/28/2018 0856   TRIG 142.0 10/28/2018 0856   HDL 64.10 10/28/2018 0856   CHOLHDL 3 10/28/2018 0856   VLDL 28.4 10/28/2018 0856   LDLCALC 80 10/28/2018 0856      Wt Readings from Last 3 Encounters:  11/12/18 142 lb 8 oz (64.6 kg)  10/28/18 142 lb 9.6 oz (64.7 kg)  09/09/18 142 lb (64.4 kg)       PAD Screen 11/12/2018  Previous PAD dx? No  Previous surgical procedure? No  Pain with walking? No  Feet/toe relief with dangling? No  Painful, non-healing ulcers? No  Extremities discolored? No      ASSESSMENT AND PLAN:  1.  Coronary artery disease involving native coronary arteries without angina: She is overall doing well with no anginal symptoms.  Continue medical therapy.  2.  Previous stroke: She has a loop recorder with no evidence of atrial fibrillation.  3.   Hyperlipidemia: Recent LDL was 80.  We should consider switching simvastatin to atorvastatin or rosuvastatin.  4.  Essential hypertension: Blood pressure is elevated today but she reports that her blood pressure runs high at the physician's office.  Continue to monitor for now.  We can consider switching atenolol to carvedilol.    Disposition:   FU with me in 6 months  Signed,  Kathlyn Sacramento, MD  11/12/2018 3:21 PM    [place]

## 2018-11-12 NOTE — Patient Instructions (Signed)
Medication Instructions:  Your physician recommends that you continue on your current medications as directed. Please refer to the Current Medication list given to you today.  If you need a refill on your cardiac medications before your next appointment, please call your pharmacy.   Lab work: None today   If you have labs (blood work) drawn today and your tests are completely normal, you will receive your results only by: Marland Kitchen MyChart Message (if you have MyChart) OR . A paper copy in the mail If you have any lab test that is abnormal or we need to change your treatment, we will call you to review the results.  Testing/Procedures: None ordered   Follow-Up: At [facility], you and your health needs are our priority.  As part of our continuing mission to provide you with exceptional heart care, we have created designated Provider Care Teams.  These Care Teams include your primary Cardiologist (physician) and Advanced Practice Providers (APPs -  Physician Assistants and Nurse Practitioners) who all work together to provide you with the care you need, when you need it. You will need a follow up appointment in 6 months.  Please call our office 2 months in advance to schedule this appointment.  You may see Dr. Fletcher Anon or one of the following Advanced Practice Providers on your designated Care Team:   Murray Hodgkins, NP Christell Faith, PA-C . Marrianne Mood, PA-C

## 2018-11-15 NOTE — Addendum Note (Signed)
Addended by: Britt Bottom on: 11/15/2018 04:27 PM   Modules accepted: Orders

## 2018-11-29 ENCOUNTER — Other Ambulatory Visit: Payer: Self-pay | Admitting: Family Medicine

## 2018-11-29 DIAGNOSIS — M1990 Unspecified osteoarthritis, unspecified site: Secondary | ICD-10-CM

## 2018-12-03 ENCOUNTER — Telehealth: Payer: Self-pay | Admitting: Lab

## 2018-12-03 NOTE — Telephone Encounter (Signed)
Received a Fax from [facility] stating the Pt should have on her Medication list Integra Plus is the brand. Pharmacy stated that this is something they have been dispensing from a different MD but there are no refills. I looked at her Med list and it looks like its already on her list FeFum-FePoly-FA-B

## 2018-12-13 ENCOUNTER — Other Ambulatory Visit: Payer: Self-pay | Admitting: Lab

## 2018-12-13 DIAGNOSIS — D508 Other iron deficiency anemias: Secondary | ICD-10-CM

## 2018-12-13 MED ORDER — INTEGRA PLUS PO CAPS: 1.0000 | ORAL_CAPSULE | Freq: Every day | ORAL | 3 refills | Status: DC

## 2018-12-13 NOTE — Telephone Encounter (Signed)
Pt daughter came to office for Rx to be sent to [facility] [address] [place]. This Rx is historic from a different provider

## 2018-12-20 ENCOUNTER — Other Ambulatory Visit: Payer: Self-pay | Admitting: Family Medicine

## 2018-12-20 DIAGNOSIS — M1990 Unspecified osteoarthritis, unspecified site: Secondary | ICD-10-CM

## 2019-01-23 ENCOUNTER — Telehealth: Payer: Self-pay | Admitting: Lab

## 2019-01-23 DIAGNOSIS — J4520 Mild intermittent asthma, uncomplicated: Secondary | ICD-10-CM

## 2019-01-23 MED ORDER — BUDESONIDE-FORMOTEROL FUMARATE 160-4.5 MCG/ACT IN AERO: 2.0000 | INHALATION_SPRAY | Freq: Two times a day (BID) | RESPIRATORY_TRACT | 3 refills | Status: DC

## 2019-01-23 NOTE — Telephone Encounter (Signed)
[employer] sent a message about [employer] for [employer] co-pay is $191.00. The insurance will cover Sybicort with a $68.00 co-pay. If okay could you send in a new Rx.

## 2019-01-23 NOTE — Telephone Encounter (Signed)
symbicort sent

## 2019-01-24 ENCOUNTER — Other Ambulatory Visit: Payer: Self-pay | Admitting: Family Medicine

## 2019-01-24 DIAGNOSIS — M1990 Unspecified osteoarthritis, unspecified site: Secondary | ICD-10-CM

## 2019-02-03 ENCOUNTER — Other Ambulatory Visit: Payer: Self-pay | Admitting: Family Medicine

## 2019-02-03 DIAGNOSIS — Z9861 Coronary angioplasty status: Secondary | ICD-10-CM

## 2019-02-03 DIAGNOSIS — I10 Essential (primary) hypertension: Secondary | ICD-10-CM

## 2019-02-16 ENCOUNTER — Other Ambulatory Visit: Payer: Self-pay | Admitting: Family Medicine

## 2019-03-11 ENCOUNTER — Other Ambulatory Visit: Payer: Self-pay | Admitting: Family Medicine

## 2019-03-11 DIAGNOSIS — M1990 Unspecified osteoarthritis, unspecified site: Secondary | ICD-10-CM

## 2019-03-11 NOTE — Telephone Encounter (Signed)
Last OV 10/29/2018   Last refilled 01/28/2019 disp 30 with 2 refills   Next OV 04/29/2019  Sent to PCP for approval

## 2019-03-23 ENCOUNTER — Other Ambulatory Visit: Payer: Self-pay | Admitting: Family Medicine

## 2019-04-29 ENCOUNTER — Ambulatory Visit (INDEPENDENT_AMBULATORY_CARE_PROVIDER_SITE_OTHER): Payer: Medicare Other | Admitting: Family Medicine

## 2019-04-29 ENCOUNTER — Other Ambulatory Visit: Payer: Self-pay

## 2019-04-29 DIAGNOSIS — E785 Hyperlipidemia, unspecified: Secondary | ICD-10-CM | POA: Diagnosis not present

## 2019-04-29 DIAGNOSIS — K219 Gastro-esophageal reflux disease without esophagitis: Secondary | ICD-10-CM

## 2019-04-29 DIAGNOSIS — J4520 Mild intermittent asthma, uncomplicated: Secondary | ICD-10-CM | POA: Diagnosis not present

## 2019-04-29 DIAGNOSIS — M1990 Unspecified osteoarthritis, unspecified site: Secondary | ICD-10-CM | POA: Diagnosis not present

## 2019-04-29 DIAGNOSIS — E039 Hypothyroidism, unspecified: Secondary | ICD-10-CM

## 2019-04-29 DIAGNOSIS — I10 Essential (primary) hypertension: Secondary | ICD-10-CM

## 2019-04-29 NOTE — Progress Notes (Signed)
Patient ID: Andrea Hart, female   DOB: November 21, 1936, 83 y.o.   MRN: [id number]    Virtual Visit via phone Note  This visit type was conducted due to national recommendations for restrictions regarding the COVID-19 pandemic (e.g. social distancing).  This format is felt to be most appropriate for this patient at this time.  All issues noted in this document were discussed and addressed.  No physical exam was performed (except for noted visual exam findings with Video Visits).   I connected with Andrea Hart today at 10:40 AM EDT by telephone and verified that I am speaking with the correct person using two identifiers. Location patient: home Location provider: [place] Persons participating in the virtual visit: patient, provider  I discussed the limitations, risks, security and privacy concerns of performing an evaluation and management service by telephone and the availability of in person appointments. I also discussed with the patient that there may be a patient responsible charge related to this service. The patient expressed understanding and agreed to proceed.  HPI:  Patient and I connected via telephone today for follow-up on multiple chronic medical conditions.  Patient has been remaining home throughout this quarantine.  She lives with her daughter, who takes very good care of her and goes to the grocery store so she does not have to worry about leaving the house.  Patient states she has not left her home in approximately 8 or 9 weeks.  States she is feeling wonderful and is been enjoying this time at her home to get unpacked and get rescheduled after moving back to the area earlier last year.  Has not checked her BP recently, but denies any chest pain, palpitations or lower extremity swelling.  Breathing has been stable.  Tolerating her thyroid medication without any problems.  Joint pain has been stable, uses Tylenol as needed and meloxicam on occasion.  GERD stable on current  medication.  Tolerating statin without any problems, no body aches or abdominal pains.  Her appetite is normal.  Urinating regularly and having normal bowel movements.    ROS: See pertinent positives and negatives per HPI.  Past Medical History:  Diagnosis Date   Asthma    Coronary artery disease    PCI and stent placement in 2013 in [place] done for stable angina.   Hyperlipidemia    Hypertension    Hypothyroidism    Stroke [facility])     Past Surgical History:  Procedure Laterality Date   CATARACT EXTRACTION Bilateral    CORONARY ANGIOPLASTY  2013   1xStent [facility] [place].   HERNIA REPAIR  2016   LOOP RECORDER IMPLANT     REPLACEMENT TOTAL KNEE Bilateral 2017   TOTAL HIP ARTHROPLASTY Right    TOTAL VAGINAL HYSTERECTOMY      Family History  Problem Relation Age of Onset   Heart Problems Mother    Heart attack Mother    Heart Problems Father    Heart attack Brother    Heart Problems Brother    Social History   Tobacco Use   Smoking status: Former Smoker   Smokeless tobacco: Never Used  Substance Use Topics   Alcohol use: Not Currently    Frequency: Never    Comment: Occasional    Current Outpatient Medications:    albuterol (PROVENTIL HFA;VENTOLIN HFA) 108 (90 Base) MCG/ACT inhaler, Inhale 2 puffs into the lungs every 4 (four) hours as needed for wheezing or shortness of breath., Disp: 1 Inhaler, Rfl: 5  aspirin EC 81 MG tablet, Take 81 mg by mouth daily., Disp: , Rfl:    atenolol (TENORMIN) 25 MG tablet, Take 1 tablet (25 mg total) by mouth daily., Disp: 90 tablet, Rfl: 1   budesonide-formoterol (SYMBICORT) 160-4.5 MCG/ACT inhaler, Inhale 2 puffs into the lungs 2 (two) times daily., Disp: 1 Inhaler, Rfl: 3   cetirizine (ZYRTEC) 10 MG tablet, Take 10 mg by mouth daily., Disp: , Rfl:    chlorthalidone (HYGROTON) 25 MG tablet, Take 1 tablet (25 mg total) by mouth daily., Disp: 90 tablet, Rfl: 1   clopidogrel (PLAVIX)  75 MG tablet, TAKE ONE TABLET BY MOUTH DAILY, Disp: 30 tablet, Rfl: 0   enalapril (VASOTEC) 20 MG tablet, Take 1 tablet (20 mg total) by mouth daily., Disp: 90 tablet, Rfl: 1   FeFum-FePoly-FA-B Cmp-C-Biot (INTEGRA PLUS PO), Take by mouth., Disp: , Rfl:    FeFum-FePoly-FA-B Cmp-C-Biot (INTEGRA PLUS) CAPS, Take 1 capsule by mouth daily., Disp: 90 capsule, Rfl: 3   levothyroxine (SYNTHROID, LEVOTHROID) 88 MCG tablet, Take 1 tablet (88 mcg total) by mouth daily before breakfast., Disp: 90 tablet, Rfl: 1   meloxicam (MOBIC) 7.5 MG tablet, TAKE ONE TABLET BY MOUTH TWICE A DAY, Disp: 60 tablet, Rfl: 1   montelukast (SINGULAIR) 10 MG tablet, Take 1 tablet (10 mg total) by mouth at bedtime., Disp: 90 tablet, Rfl: 1   Multiple Vitamin (MULTIVITAMIN) capsule, Take 1 capsule by mouth daily., Disp: , Rfl:    NIFEdipine (PROCARDIA XL/NIFEDICAL XL) 60 MG 24 hr tablet, TAKE ONE TABLET BY MOUTH DAILY, Disp: 30 tablet, Rfl: 0   NIFEdipine (PROCARDIA-XL/ADALAT CC) 60 MG 24 hr tablet, Take 1 tablet (60 mg total) by mouth daily., Disp: 90 tablet, Rfl: 1   omeprazole (PRILOSEC) 40 MG capsule, TAKE ONE CAPSULE BY MOUTH TWICE A DAY, Disp: 60 capsule, Rfl: 0   potassium chloride (K-DUR) 10 MEQ tablet, TAKE TWO TABLETS BY MOUTH TWICE A DAY, Disp: 120 tablet, Rfl: 0   potassium chloride (K-DUR,KLOR-CON) 10 MEQ tablet, Take 2 tablets (20 mEq total) by mouth 2 (two) times daily., Disp: 180 tablet, Rfl: 1   simvastatin (ZOCOR) 20 MG tablet, Take 1 tablet (20 mg total) by mouth daily., Disp: 90 tablet, Rfl: 1   VITAMIN D PO, Take by mouth daily., Disp: , Rfl:   EXAM:  GENERAL: alert, oriented, sounds well and in no acute distress  LUNGS: Speaking in full sentences, no signs of respiratory distress, breathing rate sounds normal, no obvious gross SOB, gasping/coughing or wheezing  PSYCH/NEURO: pleasant and cooperative, no obvious depression or anxiety, speech and thought processing grossly intact  ASSESSMENT  AND PLAN:  Discussed the following assessment and plan:  Essential hypertension - Plan: atenolol (TENORMIN) 25 MG tablet, chlorthalidone (HYGROTON) 25 MG tablet, enalapril (VASOTEC) 20 MG tablet, NIFEdipine (ADALAT CC) 60 MG 24 hr tablet, potassium chloride (K-DUR) 10 MEQ tablet  Mild intermittent asthma, unspecified whether complicated - Plan: budesonide-formoterol (SYMBICORT) 160-4.5 MCG/ACT inhaler, montelukast (SINGULAIR) 10 MG tablet  Hypothyroidism, unspecified type - Plan: levothyroxine (SYNTHROID) 88 MCG tablet  Osteoarthritis, unspecified osteoarthritis type, unspecified site  Gastroesophageal reflux disease without esophagitis - Plan: potassium chloride (K-DUR) 10 MEQ tablet  Hyperlipidemia, unspecified hyperlipidemia type - Plan: simvastatin (ZOCOR) 20 MG tablet  All of patient's chronic medical issues are stable at this time.  Medication refills for her blood pressure, thyroid, GERD, lipids, breathing have been sent into the pharmacy.  She is aware she can call office anytime with questions or concerns.  I discussed the assessment and treatment plan with the patient. The patient was provided an opportunity to ask questions and all were answered. The patient agreed with the plan and demonstrated an understanding of the instructions.   The patient was advised to call back or seek an in-person evaluation if the symptoms worsen or if the condition fails to improve as anticipated.  I provided 25 minutes of non-face-to-face time during this encounter.  We will plan to have patient come into office in approximately 3 months for follow-up and we will get new blood work at that time.  We discussed patient come in for blood work now, but she would prefer to wait due to the COVID-19 pandemic.  Jodelle Green, FNP

## 2019-04-30 ENCOUNTER — Other Ambulatory Visit: Payer: Self-pay | Admitting: Family Medicine

## 2019-04-30 ENCOUNTER — Encounter: Payer: Self-pay | Admitting: Family Medicine

## 2019-04-30 DIAGNOSIS — M1990 Unspecified osteoarthritis, unspecified site: Secondary | ICD-10-CM

## 2019-04-30 MED ORDER — ATENOLOL 25 MG PO TABS: 25.0000 mg | ORAL_TABLET | Freq: Every day | ORAL | 1 refills | Status: DC

## 2019-04-30 MED ORDER — NIFEDIPINE ER 60 MG PO TB24: 60.0000 mg | ORAL_TABLET | Freq: Every day | ORAL | 1 refills | Status: DC

## 2019-04-30 MED ORDER — LEVOTHYROXINE SODIUM 88 MCG PO TABS: 88.0000 ug | ORAL_TABLET | Freq: Every day | ORAL | 1 refills | Status: DC

## 2019-04-30 MED ORDER — SIMVASTATIN 20 MG PO TABS: 20.0000 mg | ORAL_TABLET | Freq: Every day | ORAL | 1 refills | Status: DC

## 2019-04-30 MED ORDER — POTASSIUM CHLORIDE CRYS ER 10 MEQ PO TBCR: 20.0000 meq | EXTENDED_RELEASE_TABLET | Freq: Two times a day (BID) | ORAL | 1 refills | Status: DC

## 2019-04-30 MED ORDER — ENALAPRIL MALEATE 20 MG PO TABS: 20.0000 mg | ORAL_TABLET | Freq: Every day | ORAL | 1 refills | Status: DC

## 2019-04-30 MED ORDER — BUDESONIDE-FORMOTEROL FUMARATE 160-4.5 MCG/ACT IN AERO: 2.0000 | INHALATION_SPRAY | Freq: Two times a day (BID) | RESPIRATORY_TRACT | 3 refills | Status: DC

## 2019-04-30 MED ORDER — MONTELUKAST SODIUM 10 MG PO TABS: 10.0000 mg | ORAL_TABLET | Freq: Every day | ORAL | 1 refills | Status: DC

## 2019-04-30 MED ORDER — CHLORTHALIDONE 25 MG PO TABS: 25.0000 mg | ORAL_TABLET | Freq: Every day | ORAL | 1 refills | Status: DC

## 2019-06-04 ENCOUNTER — Other Ambulatory Visit: Payer: Self-pay | Admitting: Family Medicine

## 2019-06-04 DIAGNOSIS — M1990 Unspecified osteoarthritis, unspecified site: Secondary | ICD-10-CM

## 2019-06-05 ENCOUNTER — Other Ambulatory Visit: Payer: Self-pay

## 2019-06-05 ENCOUNTER — Ambulatory Visit (INDEPENDENT_AMBULATORY_CARE_PROVIDER_SITE_OTHER): Payer: Medicare Other | Admitting: Family Medicine

## 2019-06-05 ENCOUNTER — Encounter: Payer: Self-pay | Admitting: Family Medicine

## 2019-06-05 VITALS — BP 142/68 | HR 65 | Temp 98.4°F | Resp 18 | Ht 62.0 in | Wt 148.0 lb

## 2019-06-05 DIAGNOSIS — R1310 Dysphagia, unspecified: Secondary | ICD-10-CM

## 2019-06-05 DIAGNOSIS — K219 Gastro-esophageal reflux disease without esophagitis: Secondary | ICD-10-CM

## 2019-06-05 NOTE — Progress Notes (Signed)
Subjective:    Patient ID: Andrea Hart, female    DOB: [birth year], 83 y.o.   MRN: [id number]  HPI    Patient resents to clinic complaining of a sensation of fullness and swelling on left side of neck/throat when eating certain foods.  States eating something like cantaloupe watermelon, pudding or yogurt she has no issues swallowing and does not feel the swelling sensation.  However, when she eats something that requires more chewing or that is tougher such as steak, chicken or more fibrous vegetables will feel as if they almost get caught in the left side of throat and will feel a swelling type sensation in left side of neck.  Denies any choking or drooling.  Denies any coughing.  Patient does have a long history of GERD and has been on a PPI for many years, currently takes 40 mg daily.  Patient Active Problem List   Diagnosis Date Noted  . Post-menopausal 07/29/2018  . H/O coronary angioplasty 07/29/2018  . Osteoarthritis 07/29/2018  . Post-nasal drip 07/29/2018  . Gastroesophageal reflux disease without esophagitis 07/29/2018  . Hypothyroidism 05/20/2018  . Anemia 05/20/2018  . Essential hypertension 05/20/2018  . Mild intermittent asthma 05/20/2018  . Hyperlipidemia 05/20/2018  . Dry eye syndrome, bilateral 08/09/2017  . Pseudophakia, both eyes 06/29/2017  . PVD (posterior vitreous detachment), right 06/29/2017  . Macular retinal edema 06/26/2017   Social History   Tobacco Use  . Smoking status: Former Research scientist (life sciences)  . Smokeless tobacco: Never Used  Substance Use Topics  . Alcohol use: Not Currently    Frequency: Never    Comment: Occasional   Review of Systems   Constitutional: Negative for chills, fatigue and fever.  HENT: Negative for congestion, ear pain, sinus pain. Pain in left side of throat with eating certain foods.   Eyes: Negative.   Respiratory: Negative for cough, shortness of breath and wheezing.   Cardiovascular: Negative for chest pain, palpitations and leg  swelling.  Gastrointestinal: Negative for abdominal pain, diarrhea, nausea and vomiting.  Genitourinary: Negative for dysuria, frequency and urgency.  Musculoskeletal: Negative for arthralgias and myalgias.  Skin: Negative for color change, pallor and rash.  Neurological: Negative for syncope, light-headedness and headaches.  Psychiatric/Behavioral: The patient is not nervous/anxious.       Objective:   Physical Exam Vitals signs and nursing note reviewed.  Constitutional:      General: She is not in acute distress.    Appearance: Normal appearance. She is not ill-appearing or toxic-appearing.  HENT:     Head: Normocephalic and atraumatic.     Mouth/Throat:     Mouth: Mucous membranes are moist.     Pharynx: Oropharynx is clear. No oropharyngeal exudate or posterior oropharyngeal erythema.     Comments: No deformity seen when throat examined. Eyes:     General: No scleral icterus.    Extraocular Movements: Extraocular movements intact.     Conjunctiva/sclera: Conjunctivae normal.  Neck:     Musculoskeletal: Normal range of motion and neck supple. No neck rigidity or muscular tenderness.  Cardiovascular:     Rate and Rhythm: Normal rate and regular rhythm.     Heart sounds: Normal heart sounds.  Pulmonary:     Effort: Pulmonary effort is normal. No respiratory distress.     Breath sounds: Normal breath sounds.  Abdominal:     General: Bowel sounds are normal. There is no distension.     Palpations: Abdomen is soft.     Tenderness: There is no  abdominal tenderness.  Lymphadenopathy:     Cervical: No cervical adenopathy.  Skin:    General: Skin is warm and dry.     Coloration: Skin is not jaundiced or pale.  Neurological:     General: No focal deficit present.     Mental Status: She is alert and oriented to person, place, and time.  Psychiatric:        Mood and Affect: Mood normal.        Behavior: Behavior normal.    Vitals:   06/05/19 1424  BP: (!) 142/68  Pulse:  65  Resp: 18  Temp: 98.4 F (36.9 C)  SpO2: 94%      Assessment & Plan:    A total of 15  minutes were spent face-to-face with the patient during this encounter and over half of that time was spent on counseling and coordination of care. The patient was counseled on potential causes of this feeling, plan for evaluation.   GERD/swallowing issue- suspect patient is having an issue related to her GERD/history of hiatal hernia.  I am wondering if she needs esophageal stretching due to foods that require more chewing/breakdown causing her to have pain/sensation of swelling and fullness in throat/left-sided neck.  Patient advised to call her gastroenterologist, last saw GI in November 2019.  In the meantime advised to be sure to cut food up into small pieces, chew food well, drink water/other fluids while eating, eat slowly.  Patient keep regularly scheduled follow-up with me as already planned.  She will call GI to get appointment set up, states she is not sure if she wants to get an endoscopy right away, but patient strongly encouraged to at least make appointment with GI to see what their recommendations would be.  Patient is agreeable and states she will reach out to her gastroenterologist.

## 2019-06-05 NOTE — Patient Instructions (Addendum)
Gastroenterology:  Andrea Hart, [facility] MD at [facility]  [address] [place], [place] [address]  [phone]

## 2019-06-25 ENCOUNTER — Other Ambulatory Visit: Payer: Self-pay | Admitting: Family Medicine

## 2019-06-25 DIAGNOSIS — M1990 Unspecified osteoarthritis, unspecified site: Secondary | ICD-10-CM

## 2019-06-25 DIAGNOSIS — I10 Essential (primary) hypertension: Secondary | ICD-10-CM

## 2019-06-25 DIAGNOSIS — Z9861 Coronary angioplasty status: Secondary | ICD-10-CM

## 2019-06-26 ENCOUNTER — Other Ambulatory Visit: Payer: Self-pay | Admitting: Lab

## 2019-06-30 ENCOUNTER — Other Ambulatory Visit: Payer: Self-pay | Admitting: Family Medicine

## 2019-06-30 DIAGNOSIS — M1990 Unspecified osteoarthritis, unspecified site: Secondary | ICD-10-CM

## 2019-06-30 DIAGNOSIS — I10 Essential (primary) hypertension: Secondary | ICD-10-CM

## 2019-06-30 DIAGNOSIS — Z9861 Coronary angioplasty status: Secondary | ICD-10-CM

## 2019-07-19 ENCOUNTER — Other Ambulatory Visit: Payer: Self-pay | Admitting: Family Medicine

## 2019-07-19 DIAGNOSIS — I10 Essential (primary) hypertension: Secondary | ICD-10-CM

## 2019-07-19 DIAGNOSIS — K219 Gastro-esophageal reflux disease without esophagitis: Secondary | ICD-10-CM

## 2019-07-19 DIAGNOSIS — Z23 Encounter for immunization: Secondary | ICD-10-CM | POA: Diagnosis not present

## 2019-07-19 DIAGNOSIS — M1990 Unspecified osteoarthritis, unspecified site: Secondary | ICD-10-CM

## 2019-07-19 DIAGNOSIS — Z9861 Coronary angioplasty status: Secondary | ICD-10-CM

## 2019-08-19 DIAGNOSIS — R1013 Epigastric pain: Secondary | ICD-10-CM | POA: Diagnosis not present

## 2019-08-19 DIAGNOSIS — R1313 Dysphagia, pharyngeal phase: Secondary | ICD-10-CM | POA: Diagnosis not present

## 2019-08-19 DIAGNOSIS — K219 Gastro-esophageal reflux disease without esophagitis: Secondary | ICD-10-CM | POA: Diagnosis not present

## 2019-08-19 DIAGNOSIS — R1310 Dysphagia, unspecified: Secondary | ICD-10-CM | POA: Diagnosis not present

## 2019-08-19 DIAGNOSIS — Z9889 Other specified postprocedural states: Secondary | ICD-10-CM | POA: Diagnosis not present

## 2019-08-22 ENCOUNTER — Other Ambulatory Visit: Payer: Self-pay | Admitting: Gastroenterology

## 2019-08-22 DIAGNOSIS — R1310 Dysphagia, unspecified: Secondary | ICD-10-CM

## 2019-08-22 DIAGNOSIS — R1013 Epigastric pain: Secondary | ICD-10-CM

## 2019-08-22 DIAGNOSIS — R1313 Dysphagia, pharyngeal phase: Secondary | ICD-10-CM

## 2019-08-29 ENCOUNTER — Other Ambulatory Visit: Payer: Self-pay | Admitting: Family Medicine

## 2019-08-29 DIAGNOSIS — Z9861 Coronary angioplasty status: Secondary | ICD-10-CM

## 2019-08-29 DIAGNOSIS — I10 Essential (primary) hypertension: Secondary | ICD-10-CM

## 2019-08-29 DIAGNOSIS — K219 Gastro-esophageal reflux disease without esophagitis: Secondary | ICD-10-CM

## 2019-08-31 ENCOUNTER — Other Ambulatory Visit: Payer: Self-pay | Admitting: Family Medicine

## 2019-08-31 DIAGNOSIS — M1990 Unspecified osteoarthritis, unspecified site: Secondary | ICD-10-CM

## 2019-09-02 ENCOUNTER — Other Ambulatory Visit: Payer: Self-pay

## 2019-09-02 ENCOUNTER — Ambulatory Visit
Admission: RE | Admit: 2019-09-02 | Discharge: 2019-09-02 | Disposition: A | Discharge status: Home / Self Care | Payer: Medicare Other | Source: Ambulatory Visit | Attending: Gastroenterology | Admitting: Gastroenterology

## 2019-09-02 DIAGNOSIS — R1310 Dysphagia, unspecified: Secondary | ICD-10-CM | POA: Diagnosis not present

## 2019-09-02 DIAGNOSIS — K219 Gastro-esophageal reflux disease without esophagitis: Secondary | ICD-10-CM | POA: Diagnosis not present

## 2019-09-02 DIAGNOSIS — R1013 Epigastric pain: Secondary | ICD-10-CM | POA: Diagnosis not present

## 2019-09-02 DIAGNOSIS — K224 Dyskinesia of esophagus: Secondary | ICD-10-CM | POA: Diagnosis not present

## 2019-09-02 DIAGNOSIS — R1313 Dysphagia, pharyngeal phase: Secondary | ICD-10-CM | POA: Diagnosis not present

## 2019-09-02 IMAGING — RF DG ESOPHAGUS
14 of 20 series · 14 of 24 positions shown · non-contrast
Comparison: None.

CLINICAL DATA: Dysphagia.  History of hiatal hernia repair

EXAM:
ESOPHOGRAM / BARIUM SWALLOW / BARIUM TABLET STUDY
TECHNIQUE: Combined double contrast and single contrast examination performed
using effervescent crystals, thick barium liquid, and thin barium
liquid. The patient was observed with fluoroscopy swallowing a 13 mm
barium sulphate tablet.
FLUOROSCOPY TIME:  Fluoroscopy Time:  2 minutes 36 seconds
Radiation Exposure Index (if provided by the fluoroscopic device):
36.1 mGy
Number of Acquired Spot Images: 17 full exposures

[Series 1: fluoro_barium 2fps_bw · 0.18mm/px · 1 of 1 slices shown (1 of 10)]
[im 1/1]
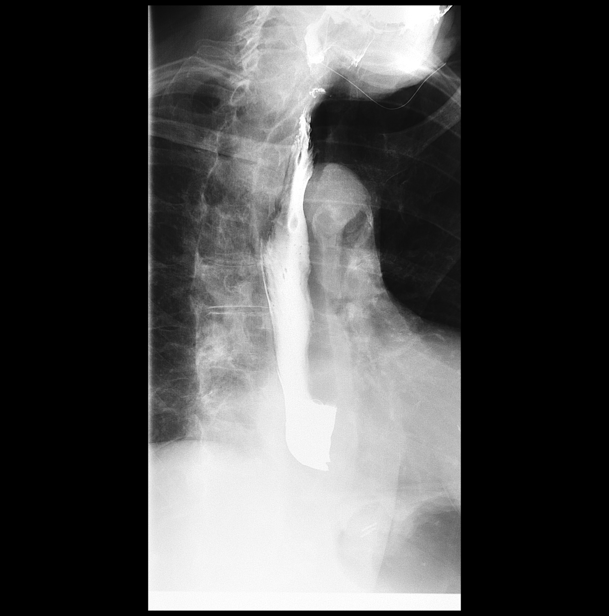

[Series 4: fluoro_barium 2fps_bw · 0.18mm/px · 1 of 1 slices shown (2 of 10)]
[im 1/1]
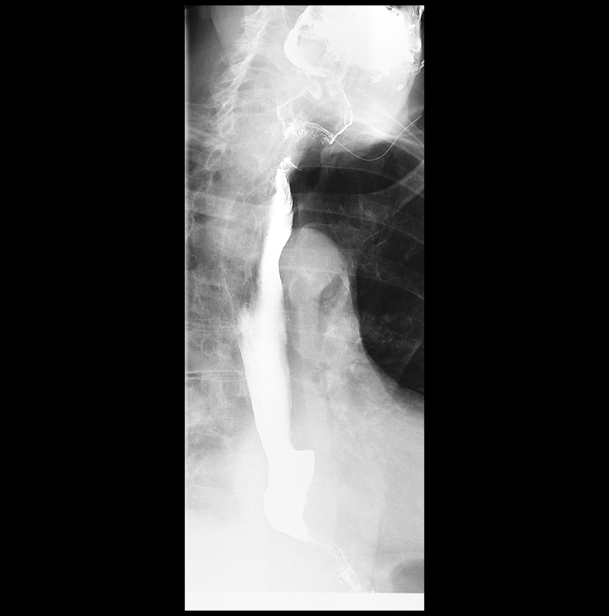

[Series 6: fluoro_barium 2fps_bw · 0.18mm/px · 1 of 1 slices shown (3 of 10)]
[im 1/1]
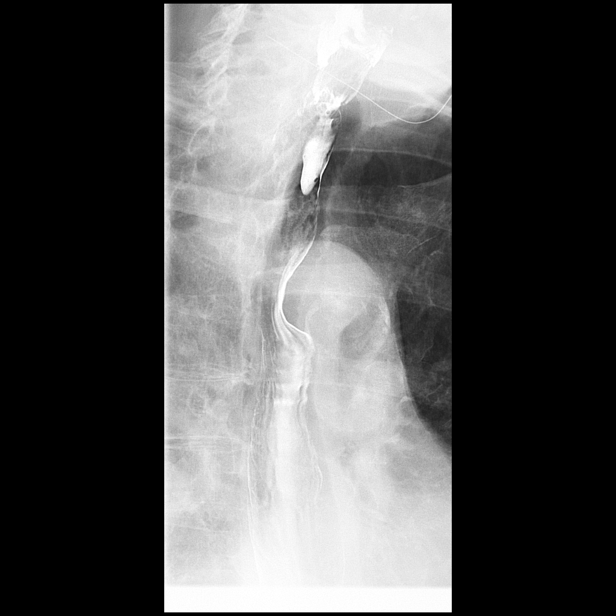

[Series 9: fluoro_barium 2fps_bw · 0.18mm/px · 1 of 1 slices shown (4 of 10)]
[im 1/1]
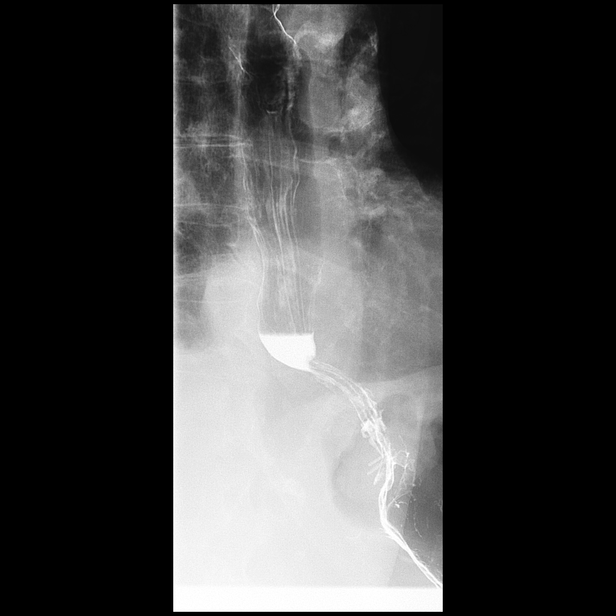

[Series 10: fluoro_barium 2fps_bw · 0.18mm/px · 1 of 1 slices shown (5 of 10)]
[im 1/1]
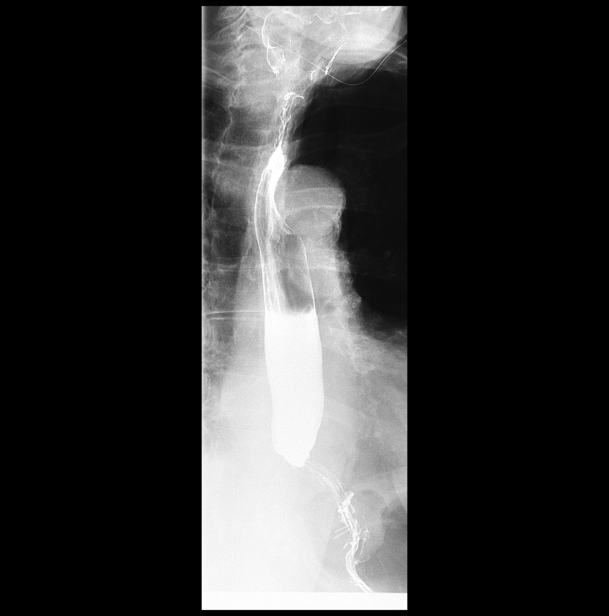

[Series 11: cp_standard · 0.27mm/px · 1 of 16 frames shown (1 of 4)]
[frame 3/16]
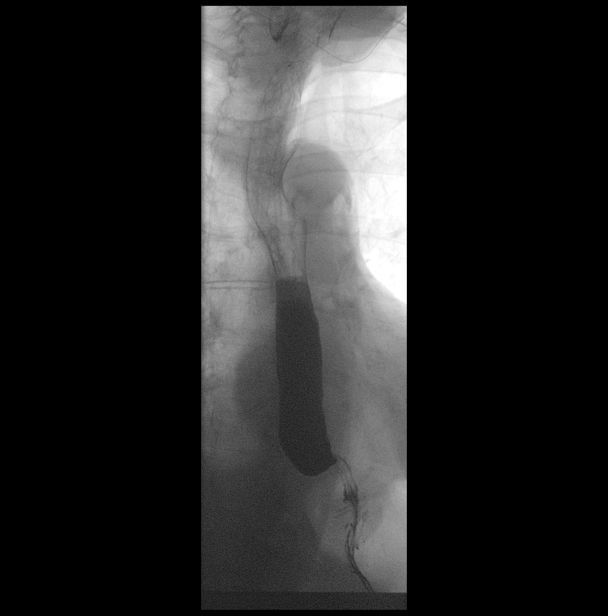

[Series 12: fluoro_barium 2fps_bw · 0.18mm/px · 1 of 1 slices shown (6 of 10)]
[im 1/1]
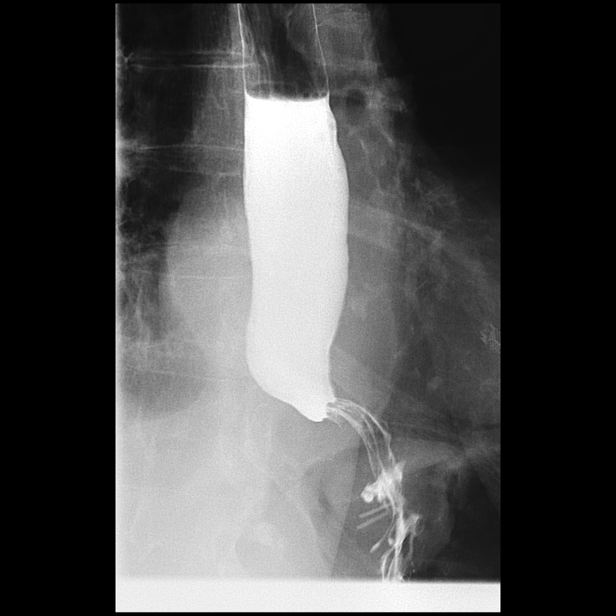

[Series 13: fluoro_barium 2fps_bw · 0.18mm/px · 1 of 1 slices shown (7 of 10)]
[im 1/1]
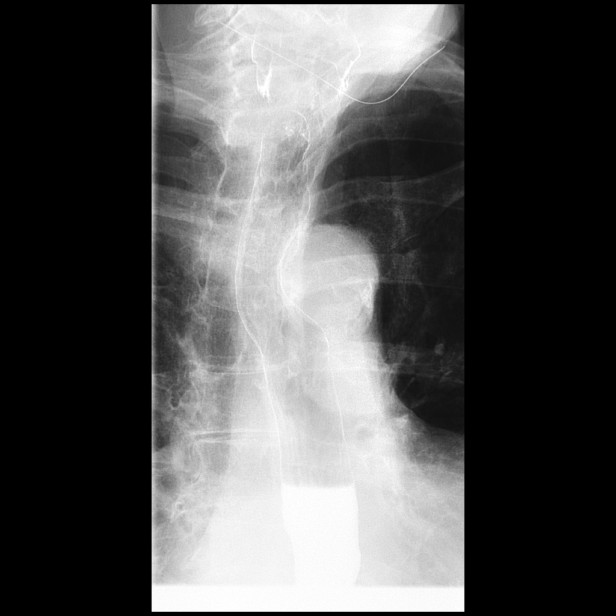

[Series 14: cp_standard · 0.18mm/px · 1 of 24 frames shown (2 of 4)]
[frame 13/24]
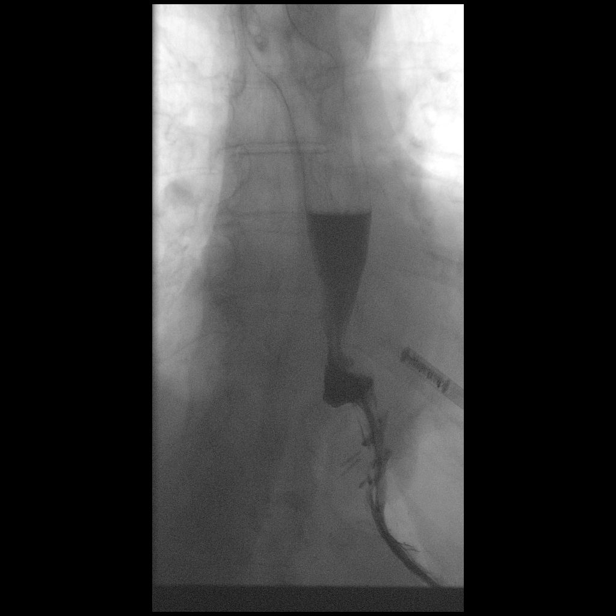

[Series 15: fluoro_barium 2fps_bw · 0.18mm/px · 1 of 1 slices shown (8 of 10)]
[im 1/1]
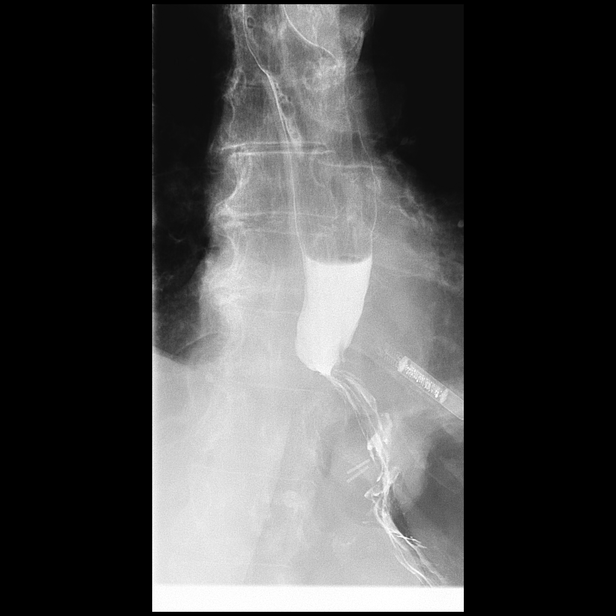

[Series 18: fluoro_barium 2fps_bw · 0.18mm/px · 1 of 1 slices shown (9 of 10)]
[im 1/1]
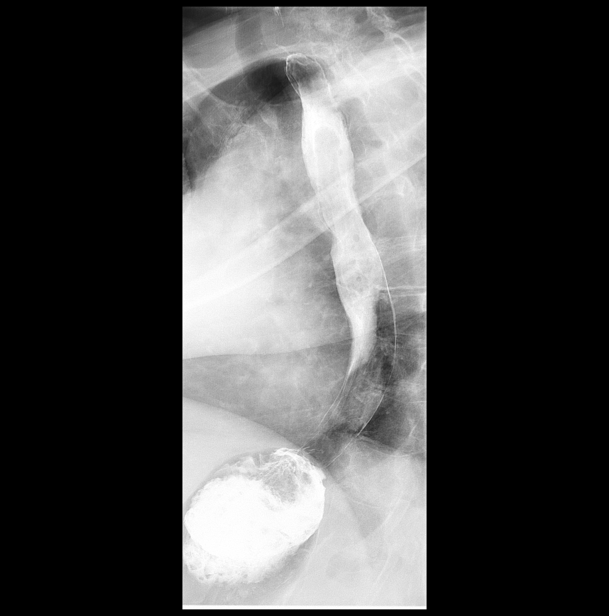

[Series 19: cp_standard · 0.27mm/px · 1 of 1 slices shown (3 of 4)]
[im 1/1]
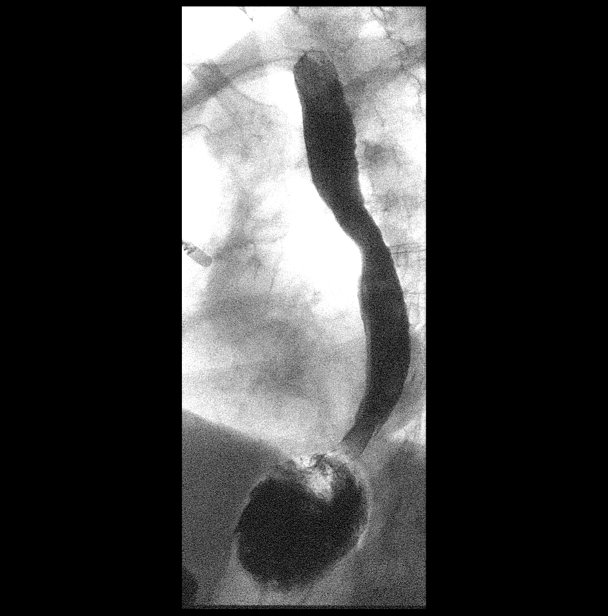

[Series 21: cp_standard · 0.27mm/px · 1 of 1 slices shown (4 of 4)]
[im 1/1]
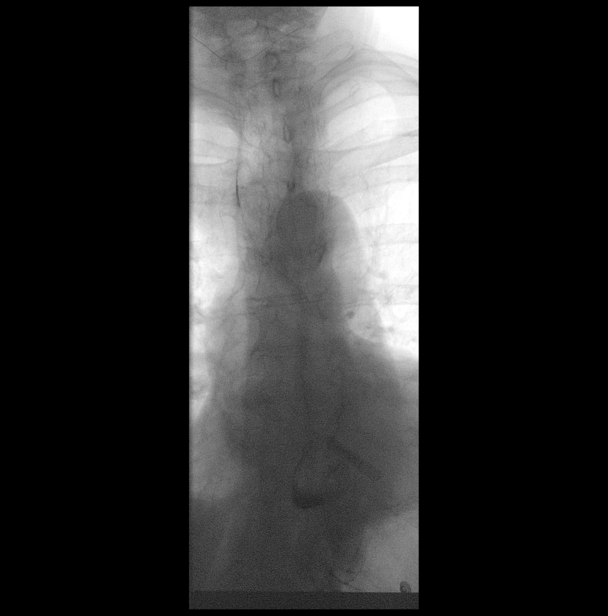

[Series 24: fluoro_barium 2fps_bw · 0.18mm/px · 1 of 1 slices shown (10 of 10)]
[im 1/1]
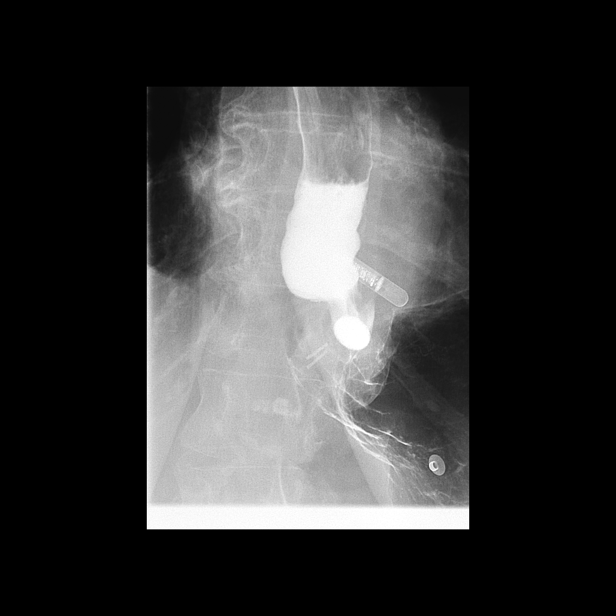

[14 of 24 positions shown; findings below may reference images not displayed]

FINDINGS: Decreased primary peristaltic wave with intermittent tertiary
contractions. Esophagus distends normally. Slow passage of the
barium column above the lower esophageal sphincter. No persisting
stricture. No discrete mass or ulceration. Mild spontaneous
gastroesophageal reflux to the level of the midesophagus. No hiatal
hernia. At the conclusion of the study, a 13 mm barium tablet was
administered. This passed into the stomach after slight delay.
Numerous surgical clips in the region of the gastroesophageal
junction.
IMPRESSION: 1. Mild esophageal dysmotility.
2. Mild gastroesophageal reflux.
3. No persisting stricture.

## 2019-09-22 ENCOUNTER — Other Ambulatory Visit: Payer: Self-pay | Admitting: Family Medicine

## 2019-09-22 DIAGNOSIS — Z9861 Coronary angioplasty status: Secondary | ICD-10-CM

## 2019-09-22 DIAGNOSIS — K219 Gastro-esophageal reflux disease without esophagitis: Secondary | ICD-10-CM

## 2019-09-22 DIAGNOSIS — I10 Essential (primary) hypertension: Secondary | ICD-10-CM

## 2019-10-11 ENCOUNTER — Other Ambulatory Visit: Payer: Self-pay | Admitting: Family Medicine

## 2019-10-11 DIAGNOSIS — I10 Essential (primary) hypertension: Secondary | ICD-10-CM

## 2019-10-11 DIAGNOSIS — K219 Gastro-esophageal reflux disease without esophagitis: Secondary | ICD-10-CM

## 2019-10-11 DIAGNOSIS — J4520 Mild intermittent asthma, uncomplicated: Secondary | ICD-10-CM

## 2019-10-11 DIAGNOSIS — Z9861 Coronary angioplasty status: Secondary | ICD-10-CM

## 2019-10-11 DIAGNOSIS — E039 Hypothyroidism, unspecified: Secondary | ICD-10-CM

## 2019-11-06 ENCOUNTER — Other Ambulatory Visit: Payer: Self-pay

## 2019-11-06 DIAGNOSIS — I10 Essential (primary) hypertension: Secondary | ICD-10-CM

## 2019-11-06 MED ORDER — CHLORTHALIDONE 25 MG PO TABS: 25.0000 mg | ORAL_TABLET | Freq: Every day | ORAL | 1 refills | Status: DC

## 2019-11-10 ENCOUNTER — Other Ambulatory Visit: Payer: Self-pay | Admitting: Family Medicine

## 2019-11-10 DIAGNOSIS — E785 Hyperlipidemia, unspecified: Secondary | ICD-10-CM

## 2019-11-10 DIAGNOSIS — E039 Hypothyroidism, unspecified: Secondary | ICD-10-CM

## 2019-11-10 DIAGNOSIS — M1990 Unspecified osteoarthritis, unspecified site: Secondary | ICD-10-CM

## 2019-11-10 DIAGNOSIS — Z9861 Coronary angioplasty status: Secondary | ICD-10-CM

## 2019-11-10 DIAGNOSIS — J4520 Mild intermittent asthma, uncomplicated: Secondary | ICD-10-CM

## 2019-11-10 DIAGNOSIS — I10 Essential (primary) hypertension: Secondary | ICD-10-CM

## 2019-11-10 DIAGNOSIS — K219 Gastro-esophageal reflux disease without esophagitis: Secondary | ICD-10-CM

## 2019-11-10 NOTE — Telephone Encounter (Signed)
Medication Refill - Medication:  omeprazole (PRILOSEC) 40 MG capsule atenolol (TENORMIN) 25 MG tablet levothyroxine (SYNTHROID) 88 MCG tablet enalapril (VASOTEC) 20 MG tablet chlorthalidone (HYGROTON) 25 MG tablet clopidogrel (PLAVIX) 75 MG tablet potassium chloride (KLOR-CON) 10 MEQ tablet NIFEdipine (ADALAT CC) 60 MG 24 hr tablet meloxicam (MOBIC) 7.5 MG tablet FeFum-FePoly-FA-B Cmp-C-Biot (INTEGRA PLUS) CAPS aspirin EC 81 MG tablet cetirizine (ZYRTEC) 10 MG tablet montelukast (SINGULAIR) 10 MG tablet simvastatin (ZOCOR) 20 MG tablet  Has the patient contacted their pharmacy? Yes - switching pharmacies, was told she needs new scripts (Agent: If no, request that the patient contact the pharmacy for the refill.) (Agent: If yes, when and what did the pharmacy advise?)  Preferred Pharmacy (with phone number or street name):  [place], [place] [phone] (Phone) [phone] (Fax)   Agent: Please be advised that RX refills may take up to 3 business days. We ask that you follow-up with your pharmacy.

## 2019-11-10 NOTE — Telephone Encounter (Signed)
No lab since 2019 please advise to refill?

## 2019-11-10 NOTE — Telephone Encounter (Signed)
Requested medication (s) are due for refill today: yes  Requested medication (s) are on the active medication list: yes  Last refill:  10/11/2019  Future visit scheduled: no  Notes to clinic:  Review medications for refill   Requested Prescriptions  Pending Prescriptions Disp Refills   atenolol (TENORMIN) 25 MG tablet 30 tablet 0    Sig: Take 1 tablet (25 mg total) by mouth daily.     Cardiovascular:  Beta Blockers Failed - 11/10/2019  9:38 AM      Failed - Last BP in normal range    BP Readings from Last 1 Encounters:  06/05/19 (!) 142/68         Passed - Last Heart Rate in normal range    Pulse Readings from Last 1 Encounters:  06/05/19 65         Passed - Valid encounter within last 6 months    Recent Outpatient Visits          5 months ago Gastroesophageal reflux disease without esophagitis   [facility] [place] G[facility], Jacquelynn Cree, FNP   6 months ago Essential hypertension   [place] Guse, Jacquelynn Cree, FNP   1 year ago Mild intermittent asthma, unspecified whether complicated   [place] Guse, Jacquelynn Cree, FNP   1 year ago Essential hypertension   [facility] [place] Guse, Jacquelynn Cree, FNP              clopidogrel (PLAVIX) 75 MG tablet 30 tablet 0    Sig: Take 1 tablet (75 mg total) by mouth daily.     Hematology: Antiplatelets - clopidogrel Failed - 11/10/2019  9:38 AM      Failed - Evaluate AST, ALT within 2 months of therapy initiation.      Failed - ALT in normal range and within 360 days    ALT  Date Value Ref Range Status  10/28/2018 15 0 - 35 U/L Final         Failed - AST in normal range and within 360 days    AST  Date Value Ref Range Status  10/28/2018 20 0 - 37 U/L Final         Failed - HCT in normal range and within 180 days    HCT  Date Value Ref Range Status  10/28/2018 34.9 (L) 36.0 - 46.0 % Final         Failed - HGB in normal range and within 180 days    Hemoglobin  Date  Value Ref Range Status  10/28/2018 11.7 (L) 12.0 - 15.0 g/dL Final         Failed - PLT in normal range and within 180 days    Platelets  Date Value Ref Range Status  10/28/2018 248.0 150.0 - 400.0 K/uL Final         Passed - Valid encounter within last 6 months    Recent Outpatient Visits          5 months ago Gastroesophageal reflux disease without esophagitis   [place] Guse, Jacquelynn Cree, FNP   6 months ago Essential hypertension   [place], Jacquelynn Cree, FNP   1 year ago Mild intermittent asthma, unspecified whether complicated   [facility] [place] Guse, Jacquelynn Cree, FNP   1 year ago Essential hypertension   [place] Guse, Jacquelynn Cree, FNP  enalapril (VASOTEC) 20 MG tablet 30 tablet 0    Sig: Take 1 tablet (20 mg total) by mouth daily.     Cardiovascular:  ACE Inhibitors Failed - 11/10/2019  9:38 AM      Failed - Cr in normal range and within 180 days    Creatinine, Ser  Date Value Ref Range Status  10/28/2018 1.16 0.40 - 1.20 mg/dL Final         Failed - K in normal range and within 180 days    Potassium  Date Value Ref Range Status  10/28/2018 4.4 3.5 - 5.1 mEq/L Final         Failed - Last BP in normal range    BP Readings from Last 1 Encounters:  06/05/19 (!) 142/68         Passed - Patient is not pregnant      Passed - Valid encounter within last 6 months    Recent Outpatient Visits          5 months ago Gastroesophageal reflux disease without esophagitis   [facility] [place] Guse, Jacquelynn Cree, FNP   6 months ago Essential hypertension   [place] Guse, Jacquelynn Cree, FNP   1 year ago Mild intermittent asthma, unspecified whether complicated   [place] Guse, Jacquelynn Cree, FNP   1 year ago Essential hypertension   [place] Guse, Jacquelynn Cree, FNP              levothyroxine (SYNTHROID) 88 MCG  tablet 30 tablet 0    Sig: TAKE ONE TABLET BY MOUTH DAILY BEFORE BREAKFAST     Endocrinology:  Hypothyroid Agents Failed - 11/10/2019  9:38 AM      Failed - TSH needs to be rechecked within 3 months after an abnormal result. Refill until TSH is due.      Failed - TSH in normal range and within 360 days    TSH  Date Value Ref Range Status  10/28/2018 1.57 0.40 - 4.50 mIU/L Final         Passed - Valid encounter within last 12 months    Recent Outpatient Visits          5 months ago Gastroesophageal reflux disease without esophagitis   [place] Guse, Jacquelynn Cree, FNP   6 months ago Essential hypertension   [place] Guse, Jacquelynn Cree, FNP   1 year ago Mild intermittent asthma, unspecified whether complicated   [place] Guse, Jacquelynn Cree, FNP   1 year ago Essential hypertension   [facility] [place] Guse, Lauren M, FNP              montelukast (SINGULAIR) 10 MG tablet 30 tablet 0    Sig: Take 1 tablet (10 mg total) by mouth at bedtime.     Pulmonology:  Leukotriene Inhibitors Passed - 11/10/2019  9:38 AM      Passed - Valid encounter within last 12 months    Recent Outpatient Visits          5 months ago Gastroesophageal reflux disease without esophagitis   [facility] [place] [facility]use, Jacquelynn Cree, FNP   6 months ago Essential hypertension   [place], FNP   1 year ago Mild intermittent asthma, unspecified whether complicated   [facility] [place] Guse, Jacquelynn Cree, FNP   1 year ago Essential hypertension   [facility] [place] G[facility], Jacquelynn Cree, FNP              NIFEdipine (ADALAT CC) 60 MG 24 hr tablet 30 tablet 0    Sig: Take 1 tablet (60 mg total) by mouth daily.     Cardiovascular:  Calcium Channel Blockers Failed - 11/10/2019  9:38 AM      Failed - Last BP in normal range    BP Readings from Last 1 Encounters:  06/05/19 (!)  142/68         Passed - Valid encounter within last 6 months    Recent Outpatient Visits          5 months ago Gastroesophageal reflux disease without esophagitis   [facility] [place] Guse, Jacquelynn Cree, FNP   6 months ago Essential hypertension   [place] Guse, Jacquelynn Cree, FNP   1 year ago Mild intermittent asthma, unspecified whether complicated   [place] Guse, Jacquelynn Cree, FNP   1 year ago Essential hypertension   [facility] [place] Guse, Jacquelynn Cree, FNP              omeprazole (PRILOSEC) 40 MG capsule 60 capsule 0    Sig: Take 1 capsule (40 mg total) by mouth 2 (two) times daily.     Gastroenterology: Proton Pump Inhibitors Passed - 11/10/2019  9:38 AM      Passed - Valid encounter within last 12 months    Recent Outpatient Visits          5 months ago Gastroesophageal reflux disease without esophagitis   [place] Guse, Jacquelynn Cree, FNP   6 months ago Essential hypertension   [place] Guse, Jacquelynn Cree, FNP   1 year ago Mild intermittent asthma, unspecified whether complicated   [place] Guse, Jacquelynn Cree, FNP   1 year ago Essential hypertension   [place] Guse, Jacquelynn Cree, FNP              potassium chloride (KLOR-CON) 10 MEQ tablet 120 tablet 0    Sig: Take 2 tablets (20 mEq total) by mouth 2 (two) times daily.     Endocrinology:  Minerals - Potassium Supplementation Failed - 11/10/2019  9:38 AM      Failed - K in normal range and within 360 days    Potassium  Date Value Ref Range Status  10/28/2018 4.4 3.5 - 5.1 mEq/L Final         Failed - Cr in normal range and within 360 days    Creatinine, Ser  Date Value Ref Range Status  10/28/2018 1.16 0.40 - 1.20 mg/dL Final         Passed - Valid encounter within last 12 months    Recent Outpatient Visits          5 months ago Gastroesophageal reflux disease without  esophagitis   [place] Guse, Jacquelynn Cree, FNP   6 months ago Essential hypertension   [place] Guse, Jacquelynn Cree, FNP   1 year ago Mild intermittent asthma, unspecified whether complicated   [facility] [place] Guse, Jacquelynn Cree, FNP   1 year ago Essential hypertension   [facility] [place] Guse, Lauren M, FNP              budesonide-formoterol (SYMBICORT) 160-4.5 MCG/ACT inhaler 1 Inhaler 3    Sig: Inhale 2 puffs into the  lungs 2 (two) times daily.     Pulmonology:  Combination Products Passed - 11/10/2019  9:38 AM      Passed - Valid encounter within last 12 months    Recent Outpatient Visits          5 months ago Gastroesophageal reflux disease without esophagitis   [place] Guse, Jacquelynn Cree, FNP   6 months ago Essential hypertension   [place] Guse, Jacquelynn Cree, FNP   1 year ago Mild intermittent asthma, unspecified whether complicated   [place] Guse, Jacquelynn Cree, FNP   1 year ago Essential hypertension   [facility] [place] Guse, Jacquelynn Cree, FNP              meloxicam (MOBIC) 7.5 MG tablet 60 tablet 2    Sig: Take 1 tablet (7.5 mg total) by mouth 2 (two) times daily. As needed     Analgesics:  COX2 Inhibitors Failed - 11/10/2019  9:38 AM      Failed - HGB in normal range and within 360 days    Hemoglobin  Date Value Ref Range Status  10/28/2018 11.7 (L) 12.0 - 15.0 g/dL Final         Failed - Cr in normal range and within 360 days    Creatinine, Ser  Date Value Ref Range Status  10/28/2018 1.16 0.40 - 1.20 mg/dL Final         Passed - Patient is not pregnant      Passed - Valid encounter within last 12 months    Recent Outpatient Visits          5 months ago Gastroesophageal reflux disease without esophagitis   [place] Guse, Jacquelynn Cree, FNP   6 months ago Essential hypertension   [place] Guse, Jacquelynn Cree, FNP   1 year ago Mild intermittent asthma, unspecified whether complicated   [place] Guse, Jacquelynn Cree, FNP   1 year ago Essential hypertension   [place] Guse, Lauren M, FNP              FeFum-FePoly-FA-B Cmp-C-Biot (INTEGRA PLUS) CAPS 90 capsule 3    Sig: Take 1 capsule by mouth daily.     There is no refill protocol information for this order     cetirizine (ZYRTEC) 10 MG tablet       Sig: Take 1 tablet (10 mg total) by mouth daily.     Ear, Nose, and Throat:  Antihistamines Passed - 11/10/2019  9:38 AM      Passed - Valid encounter within last 12 months    Recent Outpatient Visits          5 months ago Gastroesophageal reflux disease without esophagitis   [facility] [place] Guse, Jacquelynn Cree, FNP   6 months ago Essential hypertension   [place] Guse, Jacquelynn Cree, FNP   1 year ago Mild intermittent asthma, unspecified whether complicated   [place] Guse, Jacquelynn Cree, FNP   1 year ago Essential hypertension   [facility]  Guse, Jacquelynn Cree, FNP              simvastatin (ZOCOR) 20 MG tablet 90 tablet 1    Sig: Take 1 tablet (20 mg total) by mouth daily.     Cardiovascular:  Antilipid - Statins Failed - 11/10/2019  9:38 AM  Failed - Total Cholesterol in normal range and within 360 days    Cholesterol  Date Value Ref Range Status  10/28/2018 172 0 - 200 mg/dL Final    Comment:    ATP III Classification       Desirable:  < 200 mg/dL               Borderline High:  200 - 239 mg/dL          High:  > = 240 mg/dL         Failed - LDL in normal range and within 360 days    LDL Cholesterol  Date Value Ref Range Status  10/28/2018 80 0 - 99 mg/dL Final         Failed - HDL in normal range and within 360 days    HDL  Date Value Ref Range Status  10/28/2018 64.10 >39.00 mg/dL Final         Failed - Triglycerides in normal range  and within 360 days    Triglycerides  Date Value Ref Range Status  10/28/2018 142.0 0.0 - 149.0 mg/dL Final    Comment:    Normal:  <150 mg/dLBorderline High:  150 - 199 mg/dL         Passed - Patient is not pregnant      Passed - Valid encounter within last 12 months    Recent Outpatient Visits          5 months ago Gastroesophageal reflux disease without esophagitis   [facility] [place] Guse, Jacquelynn Cree, FNP   6 months ago Essential hypertension   [place] Guse, Jacquelynn Cree, FNP   1 year ago Mild intermittent asthma, unspecified whether complicated   [facility] [place] Guse, Jacquelynn Cree, FNP   1 year ago Essential hypertension   [place] Guse, Jacquelynn Cree, FNP

## 2019-11-11 MED ORDER — MELOXICAM 7.5 MG PO TABS: 7.5000 mg | ORAL_TABLET | Freq: Two times a day (BID) | ORAL | 0 refills | Status: DC

## 2019-11-11 MED ORDER — CETIRIZINE HCL 10 MG PO TABS: 10.0000 mg | ORAL_TABLET | Freq: Every day | ORAL | 0 refills | Status: DC

## 2019-11-11 MED ORDER — MONTELUKAST SODIUM 10 MG PO TABS: 10.0000 mg | ORAL_TABLET | Freq: Every day | ORAL | 0 refills | Status: DC

## 2019-11-11 MED ORDER — SIMVASTATIN 20 MG PO TABS: 20.0000 mg | ORAL_TABLET | Freq: Every day | ORAL | 0 refills | Status: DC

## 2019-11-11 MED ORDER — LEVOTHYROXINE SODIUM 88 MCG PO TABS: ORAL_TABLET | ORAL | 0 refills | Status: DC

## 2019-11-11 MED ORDER — NIFEDIPINE ER 60 MG PO TB24: 60.0000 mg | ORAL_TABLET | Freq: Every day | ORAL | 0 refills | Status: DC

## 2019-11-11 MED ORDER — OMEPRAZOLE 40 MG PO CPDR: 40.0000 mg | DELAYED_RELEASE_CAPSULE | Freq: Two times a day (BID) | ORAL | 0 refills | Status: DC

## 2019-11-11 MED ORDER — ENALAPRIL MALEATE 20 MG PO TABS: 20.0000 mg | ORAL_TABLET | Freq: Every day | ORAL | 0 refills | Status: DC

## 2019-11-11 MED ORDER — INTEGRA PLUS PO CAPS: 1.0000 | ORAL_CAPSULE | Freq: Every day | ORAL | 0 refills | Status: DC

## 2019-11-11 MED ORDER — CLOPIDOGREL BISULFATE 75 MG PO TABS: 75.0000 mg | ORAL_TABLET | Freq: Every day | ORAL | 0 refills | Status: DC

## 2019-11-11 MED ORDER — ATENOLOL 25 MG PO TABS: 25.0000 mg | ORAL_TABLET | Freq: Every day | ORAL | 0 refills | Status: DC

## 2019-11-11 MED ORDER — BUDESONIDE-FORMOTEROL FUMARATE 160-4.5 MCG/ACT IN AERO: 2.0000 | INHALATION_SPRAY | Freq: Two times a day (BID) | RESPIRATORY_TRACT | 0 refills | Status: DC

## 2019-11-11 MED ORDER — POTASSIUM CHLORIDE ER 10 MEQ PO TBCR: 20.0000 meq | EXTENDED_RELEASE_TABLET | Freq: Two times a day (BID) | ORAL | 0 refills | Status: DC

## 2019-11-11 NOTE — Telephone Encounter (Signed)
30 day refill sent to pharmacy. Please get her scheduled to establish care with someone in the office if possible and get her scheduled for labs. I have placed orders for the lab work.

## 2019-11-18 NOTE — Telephone Encounter (Signed)
Patient needs appointment to establish with [facility] and lab appointment.

## 2019-12-19 ENCOUNTER — Other Ambulatory Visit (INDEPENDENT_AMBULATORY_CARE_PROVIDER_SITE_OTHER): Payer: Medicare Other

## 2019-12-19 ENCOUNTER — Other Ambulatory Visit: Payer: Self-pay

## 2019-12-19 DIAGNOSIS — E039 Hypothyroidism, unspecified: Secondary | ICD-10-CM | POA: Diagnosis not present

## 2019-12-19 DIAGNOSIS — I10 Essential (primary) hypertension: Secondary | ICD-10-CM | POA: Diagnosis not present

## 2019-12-19 DIAGNOSIS — Z9861 Coronary angioplasty status: Secondary | ICD-10-CM

## 2019-12-19 LAB — CBC
HCT: 34.7 % — ABNORMAL LOW (ref 36.0–46.0)
Hemoglobin: 11.6 g/dL — ABNORMAL LOW (ref 12.0–15.0)
MCHC: 33.5 g/dL (ref 30.0–36.0)
MCV: 91.4 fl (ref 78.0–100.0)
Platelets: 258 10*3/uL (ref 150.0–400.0)
RBC: 3.8 Mil/uL — ABNORMAL LOW (ref 3.87–5.11)
RDW: 12.9 % (ref 11.5–15.5)
WBC: 7.4 10*3/uL (ref 4.0–10.5)

## 2019-12-19 LAB — LIPID PANEL
Cholesterol: 183 mg/dL (ref 0–200)
HDL: 59.6 mg/dL (ref >39.00)
LDL Cholesterol: 87 mg/dL (ref 0–99)
NonHDL: 123.27
Total CHOL/HDL Ratio: 3
Triglycerides: 182 mg/dL — ABNORMAL HIGH (ref 0.0–149.0)
VLDL: 36.4 mg/dL (ref 0.0–40.0)

## 2019-12-19 LAB — COMPREHENSIVE METABOLIC PANEL
ALT: 15 U/L (ref 0–35)
AST: 21 U/L (ref 0–37)
Albumin: 4.3 g/dL (ref 3.5–5.2)
Alkaline Phosphatase: 89 U/L (ref 39–117)
BUN: 20 mg/dL (ref 6–23)
CO2: 24 mEq/L (ref 19–32)
Calcium: 10.2 mg/dL (ref 8.4–10.5)
Chloride: 107 mEq/L (ref 96–112)
Creatinine, Ser: 1.37 mg/dL — ABNORMAL HIGH (ref 0.40–1.20)
GFR: 36.97 mL/min — ABNORMAL LOW (ref >60.00)
Glucose, Bld: 136 mg/dL — ABNORMAL HIGH (ref 70–99)
Potassium: 4.8 mEq/L (ref 3.5–5.1)
Sodium: 141 mEq/L (ref 135–145)
Total Bilirubin: 0.4 mg/dL (ref 0.2–1.2)
Total Protein: 7.3 g/dL (ref 6.0–8.3)

## 2019-12-19 LAB — TSH: TSH: 1.71 u[IU]/mL (ref 0.35–4.50)

## 2019-12-24 NOTE — Progress Notes (Signed)
Verbal consent for services obtained from patient prior to services given to TELEPHONE visit:   Location of call:  provider at work patient at home  Names of all persons present for services: Mable Paris, NP Chief complaint:  Feels well today. No complaints.  Speaking with candance, daughter.   HTN- at home, 'states within in range' though not sure of readings.  Compliant with medications. Denies exertional chest pain or pressure, numbness or tingling radiating to left arm or jaw, palpitations, dizziness, frequent headaches, changes in vision, or shortness of breath.   Loop recorder- Follows with Dr Fletcher Anon.   Asthma- 'not having it right now.' Has improved the last 5-6 years. No SOB, cough. Can trigger with wind.   CKD- On mobic PO BID.   Anemia  Hiatal hernia surgery years ago and advised to take mobic 7.5mg  BID for arthritic pain in hands. Pain when doing crossword puzzles.  Trying to substitute tylenol.   GERD- no trouble swallowing or pain with swallowing.      A/P/next steps:  Problem List Items Addressed This Visit      Cardiovascular and Mediastinum   CAD (coronary artery disease)    On plavix, ASA at this time. Will continue for now.      Relevant Medications   atenolol (TENORMIN) 25 MG tablet   chlorthalidone (HYGROTON) 25 MG tablet   enalapril (VASOTEC) 20 MG tablet   NIFEdipine (ADALAT CC) 60 MG 24 hr tablet   simvastatin (ZOCOR) 20 MG tablet   Essential hypertension - Primary    BP Readings from Last 3 Encounters:  06/05/19 (!) 142/68  11/12/18 (!) 156/96  10/28/18 122/82   Has loop recorder, lost to f/u with Dr Fletcher Anon. Advised that she needs to follow up with Dr Fletcher Anon and stressed the importance of this.  Offered to make appointment for patient however she declined and states she will call make follow-up.  asked her to keep blood pressure readings for me and call with readings to ensure blood pressure at goal.  Also stressed importance of having labs  done at least twice per year since she is on potassium supplement and chlorthalidone.  Patient and daughter verbalized understanding      Relevant Medications   atenolol (TENORMIN) 25 MG tablet   chlorthalidone (HYGROTON) 25 MG tablet   enalapril (VASOTEC) 20 MG tablet   NIFEdipine (ADALAT CC) 60 MG 24 hr tablet   potassium chloride (KLOR-CON) 10 MEQ tablet   clopidogrel (PLAVIX) 75 MG tablet   simvastatin (ZOCOR) 20 MG tablet     Respiratory   Mild intermittent asthma    Symptomatic stable.  Continue regimen.       Relevant Medications   albuterol (VENTOLIN HFA) 108 (90 Base) MCG/ACT inhaler   budesonide-formoterol (SYMBICORT) 160-4.5 MCG/ACT inhaler   montelukast (SINGULAIR) 10 MG tablet     Digestive   Gastroesophageal reflux disease without esophagitis    Symptomatically stable, will continue      Relevant Medications   omeprazole (PRILOSEC) 40 MG capsule     Endocrine   Hypothyroidism   Relevant Medications   atenolol (TENORMIN) 25 MG tablet   levothyroxine (SYNTHROID) 88 MCG tablet     Musculoskeletal and Integument   Osteoarthritis   Relevant Medications   diclofenac Sodium (VOLTAREN) 1 % GEL     Genitourinary   CKD (chronic kidney disease)    Discussed at length the decrease of her kidney funciton and how I  was concerned with her NSAID use.  Advised to stop NSAIDs PO and trial topical voltaren gel. Recheck BMP with f/u appointment in 3 months.          Other   Anemia    On iron. Plan to repeat CBC, IRON studies at follow up.       Relevant Medications   FeFum-FePoly-FA-B Cmp-C-Biot (INTEGRA PLUS) CAPS   Other Relevant Orders   IBC + Ferritin   H/O coronary angioplasty   Relevant Medications   clopidogrel (PLAVIX) 75 MG tablet   Hyperlipidemia   Relevant Medications   atenolol (TENORMIN) 25 MG tablet   chlorthalidone (HYGROTON) 25 MG tablet   enalapril (VASOTEC) 20 MG tablet   NIFEdipine (ADALAT CC) 60 MG 24 hr tablet   simvastatin (ZOCOR) 20  MG tablet      I spent 25 min  discussing plan of care over the phone.

## 2019-12-29 ENCOUNTER — Encounter: Payer: Self-pay | Admitting: Family

## 2019-12-29 ENCOUNTER — Other Ambulatory Visit: Payer: Self-pay

## 2019-12-29 ENCOUNTER — Ambulatory Visit (INDEPENDENT_AMBULATORY_CARE_PROVIDER_SITE_OTHER): Payer: Medicare Other | Admitting: Family

## 2019-12-29 VITALS — Ht 62.0 in | Wt 144.0 lb

## 2019-12-29 DIAGNOSIS — I10 Essential (primary) hypertension: Secondary | ICD-10-CM | POA: Diagnosis not present

## 2019-12-29 DIAGNOSIS — I2510 Atherosclerotic heart disease of native coronary artery without angina pectoris: Secondary | ICD-10-CM

## 2019-12-29 DIAGNOSIS — K219 Gastro-esophageal reflux disease without esophagitis: Secondary | ICD-10-CM

## 2019-12-29 DIAGNOSIS — N189 Chronic kidney disease, unspecified: Secondary | ICD-10-CM | POA: Diagnosis not present

## 2019-12-29 DIAGNOSIS — J4520 Mild intermittent asthma, uncomplicated: Secondary | ICD-10-CM | POA: Diagnosis not present

## 2019-12-29 DIAGNOSIS — E785 Hyperlipidemia, unspecified: Secondary | ICD-10-CM

## 2019-12-29 DIAGNOSIS — M1990 Unspecified osteoarthritis, unspecified site: Secondary | ICD-10-CM | POA: Diagnosis not present

## 2019-12-29 DIAGNOSIS — Z9861 Coronary angioplasty status: Secondary | ICD-10-CM | POA: Diagnosis not present

## 2019-12-29 DIAGNOSIS — N183 Chronic kidney disease, stage 3 unspecified: Secondary | ICD-10-CM | POA: Insufficient documentation

## 2019-12-29 DIAGNOSIS — E039 Hypothyroidism, unspecified: Secondary | ICD-10-CM

## 2019-12-29 DIAGNOSIS — D649 Anemia, unspecified: Secondary | ICD-10-CM

## 2019-12-29 MED ORDER — INTEGRA PLUS PO CAPS: 1.0000 | ORAL_CAPSULE | Freq: Every day | ORAL | 1 refills | Status: DC

## 2019-12-29 MED ORDER — POTASSIUM CHLORIDE ER 10 MEQ PO TBCR: 20.0000 meq | EXTENDED_RELEASE_TABLET | Freq: Two times a day (BID) | ORAL | 0 refills | Status: DC

## 2019-12-29 MED ORDER — CLOPIDOGREL BISULFATE 75 MG PO TABS: 75.0000 mg | ORAL_TABLET | Freq: Every day | ORAL | 1 refills | Status: DC

## 2019-12-29 MED ORDER — DICLOFENAC SODIUM 1 % EX GEL: 4.0000 g | Freq: Four times a day (QID) | CUTANEOUS | 1 refills | Status: DC

## 2019-12-29 MED ORDER — LEVOTHYROXINE SODIUM 88 MCG PO TABS: ORAL_TABLET | ORAL | 1 refills | Status: DC

## 2019-12-29 MED ORDER — BUDESONIDE-FORMOTEROL FUMARATE 160-4.5 MCG/ACT IN AERO: 2.0000 | INHALATION_SPRAY | Freq: Two times a day (BID) | RESPIRATORY_TRACT | 2 refills | Status: DC

## 2019-12-29 MED ORDER — ATENOLOL 25 MG PO TABS: 25.0000 mg | ORAL_TABLET | Freq: Every day | ORAL | 1 refills | Status: DC

## 2019-12-29 MED ORDER — OMEPRAZOLE 40 MG PO CPDR: 40.0000 mg | DELAYED_RELEASE_CAPSULE | Freq: Two times a day (BID) | ORAL | 2 refills | Status: DC

## 2019-12-29 MED ORDER — SIMVASTATIN 20 MG PO TABS: 20.0000 mg | ORAL_TABLET | Freq: Every day | ORAL | 1 refills | Status: DC

## 2019-12-29 MED ORDER — ENALAPRIL MALEATE 20 MG PO TABS: 20.0000 mg | ORAL_TABLET | Freq: Every day | ORAL | 1 refills | Status: DC

## 2019-12-29 MED ORDER — ALBUTEROL SULFATE HFA 108 (90 BASE) MCG/ACT IN AERS: 2.0000 | INHALATION_SPRAY | RESPIRATORY_TRACT | 2 refills | Status: DC | PRN

## 2019-12-29 MED ORDER — MONTELUKAST SODIUM 10 MG PO TABS: 10.0000 mg | ORAL_TABLET | Freq: Every day | ORAL | 1 refills | Status: DC

## 2019-12-29 MED ORDER — NIFEDIPINE ER 60 MG PO TB24: 60.0000 mg | ORAL_TABLET | Freq: Every day | ORAL | 1 refills | Status: DC

## 2019-12-29 MED ORDER — CHLORTHALIDONE 25 MG PO TABS: 25.0000 mg | ORAL_TABLET | Freq: Every day | ORAL | 1 refills | Status: DC

## 2019-12-29 NOTE — Assessment & Plan Note (Addendum)
Symptomatic stable.  Continue regimen.

## 2019-12-29 NOTE — Assessment & Plan Note (Addendum)
Discussed at length the decrease of her kidney funciton and how I  was concerned with her NSAID use.  Advised to stop NSAIDs PO and trial topical voltaren gel. Recheck BMP with f/u appointment in 3 months.

## 2019-12-29 NOTE — Assessment & Plan Note (Signed)
Symptomatically stable, will continue

## 2019-12-29 NOTE — Assessment & Plan Note (Addendum)
On iron. Plan to repeat CBC, IRON studies at follow up.

## 2019-12-29 NOTE — Assessment & Plan Note (Addendum)
On plavix, ASA at this time. Will continue for now.

## 2019-12-29 NOTE — Assessment & Plan Note (Addendum)
BP Readings from Last 3 Encounters:  06/05/19 (!) 142/68  11/12/18 (!) 156/96  10/28/18 122/82   Has loop recorder, lost to f/u with Dr Fletcher Anon. Advised that she needs to follow up with Dr Fletcher Anon and stressed the importance of this.  Offered to make appointment for patient however she declined and states she will call make follow-up.  asked her to keep blood pressure readings for me and call with readings to ensure blood pressure at goal.  Also stressed importance of having labs done at least twice per year since she is on potassium supplement and chlorthalidone.  Patient and daughter verbalized understanding

## 2019-12-30 ENCOUNTER — Telehealth: Payer: Self-pay | Admitting: Family Medicine

## 2019-12-30 NOTE — Telephone Encounter (Signed)
I have mailed to patient.

## 2019-12-30 NOTE — Telephone Encounter (Signed)
Pt wants lab results mailed to her home.

## 2019-12-31 ENCOUNTER — Telehealth: Payer: Self-pay

## 2019-12-31 ENCOUNTER — Telehealth: Payer: Self-pay | Admitting: Family

## 2019-12-31 DIAGNOSIS — I10 Essential (primary) hypertension: Secondary | ICD-10-CM

## 2019-12-31 NOTE — Telephone Encounter (Signed)
Call pt Would she be willing to come in for bp check on  Friday when I am here? She also needs to repeat BMP as I cannot change her blood pressure medications if her kidney function is decreasing. She needs to hydrate well prior to labs taken.  Please ensure she is OFF mobic as this can elevate her BP.   Ensure to that patient is taking blood pressure with appropriate size cuff and  Sitting still for 10 minutes.  Has she made f/u with Dr Fletcher Anon? I am reaching out to him as well.   I concerned about the lability of her blood pressure however I do not want to add a 5th agent until we know for sure how her blood pressure is running.   Atenolol 25 mg QD, chlorthalidone 25 mg QD,  enlapril 20 mg QD, nifedipine 60 mg QD  If she hasexertional chest pain or pressure, numbness or tingling radiating to left arm or jaw, palpitations, dizziness, frequent headaches, changes in vision, or shortness of breath - she needs to call 911

## 2019-12-31 NOTE — Telephone Encounter (Signed)
I called patient & she stated that she feels great. No CP, numbness or tingling in her arms, HA jaw pain, vision changes or HA. Her daughter is a Software engineer at [employer] helps patient take her BP's. She has only seen Dr. Fletcher Anon she said once to establish care last year since she just moved to the area. I have scheduled her for a [place] visit Friday at 9:30 which I will do during your break. She is also put on lab schedule for BMP.

## 2019-12-31 NOTE — Telephone Encounter (Signed)
I spoke with patient & she denies any CP, HA, jaw pain, arm pain/numbness/tingling or SOB. She said that she feels great & was very surprised at these BP readings. She said that she has taken all BP meds in the morning for years. Please advise?

## 2019-12-31 NOTE — Telephone Encounter (Signed)
Message fwd to scheduling to contact the patient to schedule an appt.

## 2019-12-31 NOTE — Telephone Encounter (Signed)
Pt called to report bp readings- 12/30/19 AM: 176/100 Noon: 136/77 2PM: 110/77 8PM: 183/100 9pm: 208/106   12/31/19-6am 198/111 9am:196/131

## 2019-12-31 NOTE — Telephone Encounter (Signed)
-----   Message from Wellington Hampshire, MD sent at 12/31/2019  3:42 PM EST ----- Esmond Plants, I agree with your idea of switching atenolol to carvedilol 6.25 mg twice daily.  I wonder if her blood pressure machine is accurate at home or not.  I will arrange for her to come to our office for follow-up.  Lattie Haw, This patient is overdue for follow-up in our office.  Please schedule her and ask her to bring blood pressure machine with her.  Thanks ----- Message ----- From: Burnard Hawthorne, FNP Sent: 12/31/2019   1:22 PM EST To: Wellington Hampshire, MD  Dr Fletcher Anon , [place] you are well.   I have inherited this patient this week who last saw you in 2019. I have advised her to call your office and make a follow up since she is over due.   I wanted run by my thoughts on her patients blood pressure.  At home it has been quite labile, however, fortunately she feels well . She called these readings in today.   12/30/19 AM: 176/100 Noon: 136/77 2PM: 110/77 8PM: 183/100 9pm: 208/106   12/31/19-6am 198/111 9am:196/131  Her BP regimen is a little odd to me . Atenolol 25 mg QD, chlorthalidone 25 mg QD,  enlapril 20 mg QD, nifedipine 60 mg QD  At first look , I dont know why she takes enlapril 20mg  just once a day and thought I could increase however her Crt has increased of late ( I advised her to stop daily Mobic and see if it improves) so increasing enlapril likely not a good choice.  I concerned about the lability of her blood pressure however I hate to add a 5th agent . Wish we could optimize her regimen. A change from atenolol to coreg 6.25 BID wouldn't offer better blood pressure control?  We are asking her to come to the office for BP check to get more data however do you have suggestions on where I can adjust from here?   I also thought I could use prn hydralazine 5mg  TID for BP > 150/90 .   I would appreciate your thoughts here.  Joycelyn Schmid

## 2020-01-02 ENCOUNTER — Other Ambulatory Visit: Payer: Self-pay

## 2020-01-02 ENCOUNTER — Encounter: Payer: Self-pay | Admitting: Family

## 2020-01-02 ENCOUNTER — Ambulatory Visit: Payer: Medicare Other

## 2020-01-02 ENCOUNTER — Ambulatory Visit (INDEPENDENT_AMBULATORY_CARE_PROVIDER_SITE_OTHER): Payer: Medicare Other | Admitting: Family

## 2020-01-02 ENCOUNTER — Other Ambulatory Visit: Payer: Self-pay | Admitting: *Deleted

## 2020-01-02 ENCOUNTER — Other Ambulatory Visit: Payer: Medicare Other

## 2020-01-02 VITALS — BP 124/60 | HR 57 | Ht 62.0 in | Wt 143.0 lb

## 2020-01-02 DIAGNOSIS — I2510 Atherosclerotic heart disease of native coronary artery without angina pectoris: Secondary | ICD-10-CM | POA: Diagnosis not present

## 2020-01-02 DIAGNOSIS — E785 Hyperlipidemia, unspecified: Secondary | ICD-10-CM | POA: Diagnosis not present

## 2020-01-02 DIAGNOSIS — I10 Essential (primary) hypertension: Secondary | ICD-10-CM | POA: Diagnosis not present

## 2020-01-02 DIAGNOSIS — D649 Anemia, unspecified: Secondary | ICD-10-CM

## 2020-01-02 MED ORDER — CHLORTHALIDONE 25 MG PO TABS: ORAL_TABLET | ORAL | 1 refills | Status: DC

## 2020-01-02 MED ORDER — ENALAPRIL MALEATE 20 MG PO TABS: 10.0000 mg | ORAL_TABLET | Freq: Two times a day (BID) | ORAL | 1 refills | Status: DC

## 2020-01-02 MED ORDER — ATENOLOL 25 MG PO TABS: ORAL_TABLET | ORAL | 1 refills | Status: DC

## 2020-01-02 MED ORDER — NIFEDIPINE ER 60 MG PO TB24: ORAL_TABLET | ORAL | 1 refills | Status: DC

## 2020-01-02 NOTE — Progress Notes (Signed)
Office Visit    Patient Name: Andrea Hart Date of Encounter: 01/02/2020  Primary Care Provider:  Burnard Hawthorne, FNP Primary Cardiologist:  Kathlyn Sacramento, MD Electrophysiologist:  None   Chief Complaint    Andrea Hart is a 84 y.o. female with a hx of CAD with previous stent 2013, HTn, HLD, asthma, hypothyroidism, CVAx2 with  loop recorder to rule out atrial fibrillation presents today for follow up of BP.   Past Medical History    Past Medical History:  Diagnosis Date  . Asthma   . Coronary artery disease    PCI and stent placement in 2013 in [place] done for stable angina.  . Hyperlipidemia   . Hypertension   . Hypothyroidism   . Stroke ([place])    2017.    Past Surgical History:  Procedure Laterality Date  . CATARACT EXTRACTION Bilateral   . CORONARY ANGIOPLASTY  2013   1xStent [facility] [place].  Marland Kitchen HERNIA REPAIR  2016  . LOOP RECORDER IMPLANT    . REPLACEMENT TOTAL KNEE Bilateral 2017  . TOTAL HIP ARTHROPLASTY Right   . TOTAL VAGINAL HYSTERECTOMY      Allergies  No Known Allergies  History of Present Illness    Andrea Hart is a 84 y.o. female with a hx of CAD with previous stent 2013, HTn, HLD, asthma, hypothyroidism, CVAx2 with  loop recorder to rule out atrial fibrillation last seen 11/2018 by Dr. Fletcher Anon.  She brings very detailed notes to her office visit today.  She is present today with her daughter who she lives with.  Reports that Monday she ate some soup around 1 or 2 PM and about 8 or 9 PM she got very anxious, sweaty, shaking.  She had a Diet Coke and had some crackers and felt better about 30 minutes later.  We discussed that this is likely an episode of hypoglycemia.  She tells me that this happens to her in the morning as well and it felt similar.  We discussed the importance of eating small but regular meals.  Reassurance provided that this was not consistent with a heart attack or stroke.  Reports no chest pain, pressure,  tightness.  Reports no shortness of breath nor dyspnea on exertion. Reports a little bit of lightheadedness with position changes.  We discussed orthostatic hypotension precautions.  Has had both knees replaced. Works around [employer] and enjoys being active.  No formal exercise regimen.  To me her blood pressure has normally been well controlled.  She has been checking it recently she was asked to do so by her PCP.  January 26 blood pressure ranged 110/77 - 208/106 with 9 readings throughout the day.  Pressure January 27 141/107-198/111 with 6 readings.  Blood pressure January 28 149/81-178/101 with 5 readings.  We discussed that blood pressure naturally woke up and down throughout the day based on stress, eating, and drinking.  She does use an arm cuff but has not been checked for accuracy.  She does not routinely rest before checking her blood pressure and was educated to do so.  EKGs/Labs/Other Studies Reviewed:   The following studies were reviewed today:  EKG:  EKG is ordered today.  The ekg ordered today demonstrates sinus bradycardia with PVC, minimal voltage criteria for LVH, no acute ST/T wave changes  Recent Labs: 12/19/2019: ALT 15; BUN 20; Creatinine, Ser 1.37; Hemoglobin 11.6; Platelets 258.0; Potassium 4.8; Sodium 141; TSH 1.71  Recent Lipid Panel    Component  Value Date/Time   CHOL 183 12/19/2019 0957   TRIG 182.0 (H) 12/19/2019 0957   HDL 59.60 12/19/2019 0957   CHOLHDL 3 12/19/2019 0957   VLDL 36.4 12/19/2019 0957   LDLCALC 87 12/19/2019 0957    Home Medications   Current Meds  Medication Sig  . albuterol (VENTOLIN HFA) 108 (90 Base) MCG/ACT inhaler Inhale 2 puffs into the lungs every 4 (four) hours as needed for wheezing or shortness of breath.  Marland Kitchen aspirin EC 81 MG tablet Take 81 mg by mouth daily.  Marland Kitchen atenolol (TENORMIN) 25 MG tablet Take 1 tablet (25 mg) by mouth once daily in the morning  . cetirizine (ZYRTEC) 10 MG tablet Take 1 tablet (10 mg total) by mouth  daily.  . chlorthalidone (HYGROTON) 25 MG tablet Take 1 tablet (25 mg) by mouth once daily in the morning  . clopidogrel (PLAVIX) 75 MG tablet Take 1 tablet (75 mg total) by mouth daily.  . diclofenac Sodium (VOLTAREN) 1 % GEL Apply 4 g topically 4 (four) times daily.  . enalapril (VASOTEC) 20 MG tablet Take 0.5 tablets (10 mg total) by mouth 2 (two) times daily.  Marland Kitchen FeFum-FePoly-FA-B Cmp-C-Biot (INTEGRA PLUS) CAPS Take 1 capsule by mouth daily.  Marland Kitchen levothyroxine (SYNTHROID) 88 MCG tablet TAKE ONE TABLET BY MOUTH DAILY BEFORE BREAKFAST  . montelukast (SINGULAIR) 10 MG tablet Take 1 tablet (10 mg total) by mouth at bedtime.  . Multiple Vitamin (MULTIVITAMIN) capsule Take 1 capsule by mouth daily.  Marland Kitchen NIFEdipine (ADALAT CC) 60 MG 24 hr tablet Take 1 tablet (60 mg) by mouth once daily in the evening  . omeprazole (PRILOSEC) 40 MG capsule Take 1 capsule (40 mg total) by mouth 2 (two) times daily.  . potassium chloride (KLOR-CON) 10 MEQ tablet Take 2 tablets (20 mEq total) by mouth 2 (two) times daily.  . simvastatin (ZOCOR) 20 MG tablet Take 1 tablet (20 mg total) by mouth daily.  Marland Kitchen VITAMIN D PO Take by mouth daily.  . [DISCONTINUED] atenolol (TENORMIN) 25 MG tablet Take 1 tablet (25 mg total) by mouth daily.  . [DISCONTINUED] chlorthalidone (HYGROTON) 25 MG tablet Take 1 tablet (25 mg total) by mouth daily.  . [DISCONTINUED] enalapril (VASOTEC) 20 MG tablet Take 1 tablet (20 mg total) by mouth daily.  . [DISCONTINUED] NIFEdipine (ADALAT CC) 60 MG 24 hr tablet Take 1 tablet (60 mg total) by mouth daily.    Review of Systems      Review of Systems  Constitution: Negative for chills, fever and malaise/fatigue.  Cardiovascular: Negative for chest pain, dyspnea on exertion, leg swelling, near-syncope, orthopnea, palpitations and syncope.       (+) elevated blood pressures  Respiratory: Negative for cough, shortness of breath and wheezing.   Gastrointestinal: Negative for nausea and vomiting.    Neurological: Negative for dizziness, light-headedness and weakness.   All other systems reviewed and are otherwise negative except as noted above.  Physical Exam    VS:  BP 124/60 (BP Location: Left Arm, Patient Position: Sitting, Cuff Size: Normal)   Pulse (!) 57   Ht 5\' 2"  (1.575 m)   Wt 143 lb (64.9 kg)   SpO2 96%   BMI 26.16 kg/m  , BMI Body mass index is 26.16 kg/m. GEN: Well nourished, well developed, in no acute distress. HEENT: normal. Neck: Supple, no JVD, carotid bruits, or masses. Cardiac: RRR, no murmurs, rubs, or gallops. No clubbing, cyanosis, edema.  Radials/PT 2+ and equal bilaterally.  Respiratory:  Respirations regular  and unlabored, clear to auscultation bilaterally. GI: Soft, nontender, nondistended, BS + x 4. MS: No deformity or atrophy. Skin: Warm and dry, no rash. Neuro:  Strength and sensation are intact. Psych: Normal affect.  Accessory Clinical Findings    ECG personally reviewed by me today -  sinus bradycardia with PVC, minimal voltage criteria for LVH, no acute ST/T wave changes - no acute changes.  Assessment & Plan    1. HTN - Blood pressure well controlled today.  However she brings blood pressure log with readings as high as 281/106 and as low as 110/77 from home. Very labile throughout the day.  Tells me it is an arm cuff and she is checked her blood pressure with 2 arm cuffs that are both consistent, one of them being new.  Unfortunately did not bring her blood pressure cuff to the office today.  Hesitant to make significant changes in the setting of normal blood pressure in the office setting to avoid hypotension.  As such, today we will try to split her blood pressure medications throughout the day for optimization of control throughout the day. Her daughter helps her with her pill box.   Continue atenolol 25 mg daily. Will plan to increase to BID at upcoming office visit or switch to Coreg for better BP control.  Continue Chlorthalidone 25mg   in the morning, Nifedipine 60mg  in the evening, Enalapril we will split to have her take 10mg  in the AM and 10mg  in the PM.   2. HLD -presently on Simvastatin 20 mg daily.  Daughter tells me this was decreased from 40 mg after knee surgery a couple years ago.  Lipid panel 12/19/2019 with LDL 87.  Will likely need to increase to higher potency statin or add Zetia at future office visit to get her LDL less than 70 in setting of CAD, hx CVA.  3. CAD - s/p remote stent.  No anginal symptoms, no indication for ischemic evaluation at this time.  Continue GDMT of aspirin, beta-blocker, statin.  4. Hx CVA - Has ILR in place from outside hospital. Continue aspirin, statin.   Disposition: Follow up in 1 week(s) with Dr. Fletcher Anon or APP    Loel Dubonnet, NP 01/02/2020, 4:43 PM

## 2020-01-02 NOTE — Telephone Encounter (Signed)
Call pt and daugter I see she is seeing Dr Fletcher Anon this afternoon It is up to her however if she would like to defer coming her for BP check , I think that is reasonable. It is duplicative to have her coming here and also seeing him

## 2020-01-02 NOTE — Patient Instructions (Addendum)
Medication Instructions:  Your physician has recommended you make the following change in your medication:   Atenolol 25mg  daily in the morning   Chlorthalidone 25mg  (one tablet) daily in the morning  Enalapril 10mg  (half tablet) twice daily  Nifedipine 60 mg (one tablet) in the evening  *If you need a refill on your cardiac medications before your next appointment, please call your pharmacy*  Lab Work: None ordered today. I will discuss with Mable Paris, FNP when she wants your next labs done.   Testing/Procedures: You had an EKG today. It showed normal sinus rhythm with an occasional PVC which is an early beat in the bottom chamber of the heart. This is a common finding and not dangerous.   Follow-Up: At [facility], you and your health needs are our priority.  As part of our continuing mission to provide you with exceptional heart care, we have created designated Provider Care Teams.  These Care Teams include your primary Cardiologist (physician) and Advanced Practice Providers (APPs -  Physician Assistants and Nurse Practitioners) who all work together to provide you with the care you need, when you need it.  Your next appointment:   1 week(s)  The format for your next appointment:   In Person  Provider:    You may see Kathlyn Sacramento, MD or one of the following Advanced Practice Providers on your designated Care Team:    Murray Hodgkins, NP  Christell Faith, PA-C  Marrianne Mood, PA-C   Other Instructions  Keep a blood pressure log. Bring your blood pressure cuff to your next office visit so we can be sure it is accurate.   Check your blood pressure twice per day. Please check approximately 11 am and 8 pm. At least two hours after your morning and evening medications.

## 2020-01-06 ENCOUNTER — Ambulatory Visit: Payer: Medicare Other

## 2020-01-08 ENCOUNTER — Ambulatory Visit (INDEPENDENT_AMBULATORY_CARE_PROVIDER_SITE_OTHER): Payer: Medicare Other | Admitting: Nurse Practitioner

## 2020-01-08 ENCOUNTER — Ambulatory Visit (INDEPENDENT_AMBULATORY_CARE_PROVIDER_SITE_OTHER): Payer: Medicare Other

## 2020-01-08 ENCOUNTER — Other Ambulatory Visit: Payer: Self-pay

## 2020-01-08 ENCOUNTER — Encounter: Payer: Self-pay | Admitting: Nurse Practitioner

## 2020-01-08 VITALS — BP 138/60 | HR 52 | Ht 62.0 in | Wt 142.2 lb

## 2020-01-08 DIAGNOSIS — R55 Syncope and collapse: Secondary | ICD-10-CM

## 2020-01-08 DIAGNOSIS — I10 Essential (primary) hypertension: Secondary | ICD-10-CM

## 2020-01-08 DIAGNOSIS — I2510 Atherosclerotic heart disease of native coronary artery without angina pectoris: Secondary | ICD-10-CM

## 2020-01-08 DIAGNOSIS — E785 Hyperlipidemia, unspecified: Secondary | ICD-10-CM

## 2020-01-08 MED ORDER — ROSUVASTATIN CALCIUM 20 MG PO TABS: 20.0000 mg | ORAL_TABLET | Freq: Every day | ORAL | 3 refills | Status: DC

## 2020-01-08 MED ORDER — CARVEDILOL 3.125 MG PO TABS: 3.1250 mg | ORAL_TABLET | Freq: Two times a day (BID) | ORAL | 3 refills | Status: DC

## 2020-01-08 MED ORDER — ENALAPRIL MALEATE 10 MG PO TABS: 10.0000 mg | ORAL_TABLET | Freq: Two times a day (BID) | ORAL | 3 refills | Status: DC

## 2020-01-08 NOTE — Patient Instructions (Signed)
Medication Instructions:  1- STOP Zocor 2- STOP Atenolol 3- CHANGE Enalapril to Take 1 tablet (10 mg total) by mouth 2 (two) times daily 4- START Crestor Take 1 tablet (20 mg total) by mouth daily 5- START Coreg Take 1 tablet (3.125 mg total) by mouth 2 (two) times daily with a meal *If you need a refill on your cardiac medications before your next appointment, please call your pharmacy*  Lab Work: None ordered  If you have labs (blood work) drawn today and your tests are completely normal, you will receive your results only by: Marland Kitchen MyChart Message (if you have MyChart) OR . A paper copy in the mail If you have any lab test that is abnormal or we need to change your treatment, we will call you to review the results.  Testing/Procedures: 1- A zio monitor was placed today. It will remain on for 14 days. You will then return monitor and event diary in provided box. It takes 1-2 weeks for report to be downloaded and returned to [place]. We will call you with the results. If monitor falls of or has orange flashing light, please call Zio for further instructions.     Follow-Up: At [facility], you and your health needs are our priority.  As part of our continuing mission to provide you with exceptional heart care, we have created designated Provider Care Teams.  These Care Teams include your primary Cardiologist (physician) and Advanced Practice Providers (APPs -  Physician Assistants and Nurse Practitioners) who all work together to provide you with the care you need, when you need it.  Your next appointment:   1 week(s)  The format for your next appointment:   Virtual Visit   Provider:   Laurann Montana, NP  Other Instructions 2- Follow up in office in 1 month with Dr. Fletcher Anon or Laurann Montana, NP

## 2020-01-08 NOTE — Progress Notes (Signed)
Office Visit    Patient Name: Andrea Hart Date of Encounter: 01/08/2020  Primary Care Provider:  Burnard Hawthorne, FNP Primary Cardiologist:  Kathlyn Sacramento, MD  Chief Complaint    84 year old female with a history of coronary artery disease status post prior stenting in 2013, hypertension, hyperlipidemia, hypothyroidism, asthma, and prior stroke x2, who presents for follow-up related to hypertension.  Past Medical History    Past Medical History:  Diagnosis Date  . Asthma   . Coronary artery disease    a. 2013 s/p PCI and stent placement in [facility] done for stable angina.  . Hyperlipidemia   . Hypertension   . Hypothyroidism   . Stroke [facility])    a. 2017 - ILR did not show afib.   Past Surgical History:  Procedure Laterality Date  . CATARACT EXTRACTION Bilateral   . CORONARY ANGIOPLASTY  2013   1xStent [facility] [place].  Marland Kitchen HERNIA REPAIR  2016  . LOOP RECORDER IMPLANT    . REPLACEMENT TOTAL KNEE Bilateral 2017  . TOTAL HIP ARTHROPLASTY Right   . TOTAL VAGINAL HYSTERECTOMY      Allergies  No Known Allergies  History of Present Illness    84 year old female with a history of CAD status post prior stenting in 2013, hypertension, hyperlipidemia, hypothyroidism, asthma, and prior stroke.  As noted, she previously underwent stenting of an unknown vessel in [place] in 2013.  In 2017, she suffered a stroke.  An implantable loop recorder did not show any atrial fibrillation and she has been maintained on aspirin and Plavix ever since.  She was recently seen in clinic on January 29 due to an episode of sudden onset of diaphoresis, anxiety, and lightheadedness.  Thinking that maybe her blood sugar was low, she heard into her kitchen and ate some crackers and felt better within about 30 minutes.  When she was seen in follow-up, she reported labile blood pressures with a range of 110/77 to 208/106.  Blood pressure in the day was normal at  124/60.  Adjustments were made to her regimen including changing her enalapril from 20 mg daily to 10 mg twice daily.  Also, her nifedipine 60 mg was switched from every morning to every evening.  She has been tracking her blood pressure since then and with the exception of her 11 PM or midnight blood pressure, which is usually normal, pressures have been trending in the 150s to 170s throughout the day with heart rates in the 50s.  She did have 1 more episode of sudden onset of lightheadedness and anxiety without associated diaphoresis.  This lasted just a few seconds and resolve spontaneously.  She denies chest pain, dyspnea, palpitations, PND, orthopnea, syncope, edema, or early satiety.  Home Medications    Prior to Admission medications   Medication Sig Start Date End Date Taking? Authorizing Provider  albuterol (VENTOLIN HFA) 108 (90 Base) MCG/ACT inhaler Inhale 2 puffs into the lungs every 4 (four) hours as needed for wheezing or shortness of breath. 12/29/19  Yes Burnard Hawthorne, FNP  aspirin EC 81 MG tablet Take 81 mg by mouth daily.   Yes [provider]  atenolol (TENORMIN) 25 MG tablet Take 1 tablet (25 mg) by mouth once daily in the morning 01/02/20  Yes Loel Dubonnet, NP  cetirizine (ZYRTEC) 10 MG tablet Take 1 tablet (10 mg total) by mouth daily. 11/11/19  Yes Leone Haven, MD  chlorthalidone (HYGROTON) 25 MG tablet Take 1  tablet (25 mg) by mouth once daily in the morning 01/02/20  Yes Loel Dubonnet, NP  clopidogrel (PLAVIX) 75 MG tablet Take 1 tablet (75 mg total) by mouth daily. 12/29/19  Yes Arnett, Yvetta Coder, FNP  diclofenac Sodium (VOLTAREN) 1 % GEL Apply 4 g topically 4 (four) times daily. 12/29/19  Yes Burnard Hawthorne, FNP  enalapril (VASOTEC) 20 MG tablet Take 0.5 tablets (10 mg total) by mouth 2 (two) times daily. 01/02/20  Yes Loel Dubonnet, NP  FeFum-FePoly-FA-B Cmp-C-Biot (INTEGRA PLUS) CAPS Take 1 capsule by mouth daily. 12/29/19  Yes Burnard Hawthorne, FNP  levothyroxine (SYNTHROID) 88 MCG tablet TAKE ONE TABLET BY MOUTH DAILY BEFORE BREAKFAST 12/29/19  Yes Arnett, Yvetta Coder, FNP  montelukast (SINGULAIR) 10 MG tablet Take 1 tablet (10 mg total) by mouth at bedtime. 12/29/19  Yes Burnard Hawthorne, FNP  Multiple Vitamin (MULTIVITAMIN) capsule Take 1 capsule by mouth daily.   Yes [provider]  NIFEdipine (ADALAT CC) 60 MG 24 hr tablet Take 1 tablet (60 mg) by mouth once daily in the evening 01/02/20  Yes Loel Dubonnet, NP  omeprazole (PRILOSEC) 40 MG capsule Take 1 capsule (40 mg total) by mouth 2 (two) times daily. 12/29/19  Yes Arnett, Yvetta Coder, FNP  potassium chloride (KLOR-CON) 10 MEQ tablet Take 2 tablets (20 mEq total) by mouth 2 (two) times daily. 12/29/19  Yes Burnard Hawthorne, FNP  simvastatin (ZOCOR) 20 MG tablet Take 1 tablet (20 mg total) by mouth daily. 12/29/19  Yes Burnard Hawthorne, FNP  VITAMIN D PO Take by mouth daily.   Yes [provider]  meloxicam (MOBIC) 7.5 MG tablet TAKE ONE TABLET BY MOUTH TWICE A DAY 07/21/19  Yes Guse, Jacquelynn Cree, FNP    Review of Systems    Since her last visit, 1 additional episode of sudden onset of lightheadedness, which resolved in just a few seconds.  She denies chest pain, dyspnea, palpitations, PND, orthopnea, syncope, edema, or early satiety.  All other systems reviewed and are otherwise negative except as noted above.  Physical Exam    VS:  BP 138/60 (BP Location: Left Arm, Patient Position: Sitting, Cuff Size: Normal)   Pulse (!) 52   Ht 5\' 2"  (1.575 m)   Wt 142 lb 4 oz (64.5 kg)   BMI 26.02 kg/m  , BMI Body mass index is 26.02 kg/m. GEN: Well nourished, well developed, in no acute distress. HEENT: normal. Neck: Supple, no JVD, carotid bruits, or masses. Cardiac: RRR, bradycardic, no murmurs, rubs, or gallops. No clubbing, cyanosis, edema.  Radials/PT 2+ and equal bilaterally.  Respiratory:  Respirations regular and unlabored, clear to auscultation  bilaterally. GI: Soft, nontender, nondistended, BS + x 4. MS: no deformity or atrophy. Skin: warm and dry, no rash. Neuro:  Strength and sensation are intact. Psych: Normal affect.  Accessory Clinical Findings    ECG personally reviewed by me today -sinus bradycardia, 52, baseline artifact/nonspecific ST changes - no acute changes.  In the setting of bradycardia and baseline artifact, I did run a 12-lead rhythm strip there is no evidence of high-grade heart block.  Lab Results  Component Value Date   WBC 7.4 12/19/2019   HGB 11.6 (L) 12/19/2019   HCT 34.7 (L) 12/19/2019   MCV 91.4 12/19/2019   PLT 258.0 12/19/2019   Lab Results  Component Value Date   CREATININE 1.37 (H) 12/19/2019   BUN 20 12/19/2019   NA 141 12/19/2019   K  4.8 12/19/2019   CL 107 12/19/2019   CO2 24 12/19/2019   Lab Results  Component Value Date   ALT 15 12/19/2019   AST 21 12/19/2019   ALKPHOS 89 12/19/2019   BILITOT 0.4 12/19/2019   Lab Results  Component Value Date   CHOL 183 12/19/2019   HDL 59.60 12/19/2019   LDLCALC 87 12/19/2019   TRIG 182.0 (H) 12/19/2019   CHOLHDL 3 12/19/2019     Assessment & Plan    1.  Essential hypertension: Patient brought blood pressure records today and for the most part, she is running in the 150s to 170s at home.  With sinus bradycardia and a creatinine of 1.37 in January, I do not think atenolol is the best medication for her.  Going to switch this to carvedilol 3.125 mg twice daily and we may see an improved blood pressure effect with this.  Based on heart rates, we may be able to titrate this slightly but I suspect probably not.  Otherwise, continue enalapril 10 mg twice daily, chlorthalidone 25 mg daily, and nifedipine 60 mg every afternoon.  I will arrange for a follow-up phone visit next week to rediscuss blood pressures on carvedilol.  If titrating carvedilol further is not an option, could consider either titrating nifedipine or changing to amlodipine 10 mg  daily.  2.  Presyncope: Over the past few weeks, patient has had 2 episodes of presyncope.  The first was more significant and was associated with diaphoresis.  On both occasions, she had very abrupt onset and offset of symptoms.  We will go ahead and place a ZIO AT monitor to assess for bradycardia or tachyarrhythmias which might account for symptoms.  3.  Coronary artery disease: Status post stenting in 2013.  She has not been having any chest pain or dyspnea.  She remains on beta-blocker (switching to carvedilol), aspirin, Plavix, ACE inhibitor, and statin therapy (switching to rosuvastatin).  4.  Hyperlipidemia: Recent LDL of 87 on January 15 with normal LFTs.  As she is not at goal, we discussed potentially switching from simvastatin 20 to a more potent agent.  She is agreeable.  We will switch to rosuvastatin 20 mg daily.  Plan to follow-up lipids and LFTs in approximately 6 weeks.  5.  History of CVA: She remains on aspirin, statin, and Plavix.  Focusing on blood pressure control currently.  Prior implantable loop recorder did not show any evidence of A. fib.    6.  Disposition: Follow-up for blood pressure check phone visit next week.  We will plan to see her back in clinic in approximately a month after ZIO monitoring.  She will need follow-up lipids and LFTs in approximately 6 weeks.  Murray Hodgkins, NP 01/08/2020, 4:59 PM

## 2020-01-08 NOTE — Telephone Encounter (Signed)
Patient cancelled  the nurse visit and saw Dr. Fletcher Anon for BP check .  Aryahi Denzler,cma

## 2020-01-09 DIAGNOSIS — R55 Syncope and collapse: Secondary | ICD-10-CM | POA: Diagnosis not present

## 2020-01-15 ENCOUNTER — Telehealth: Payer: Medicare Other | Admitting: Family

## 2020-01-26 ENCOUNTER — Telehealth (INDEPENDENT_AMBULATORY_CARE_PROVIDER_SITE_OTHER): Payer: Medicare Other | Admitting: Family

## 2020-01-26 ENCOUNTER — Encounter: Payer: Self-pay | Admitting: Family

## 2020-01-26 VITALS — BP 140/96 | HR 86 | Ht 62.0 in | Wt 142.0 lb

## 2020-01-26 DIAGNOSIS — D649 Anemia, unspecified: Secondary | ICD-10-CM | POA: Diagnosis not present

## 2020-01-26 DIAGNOSIS — I10 Essential (primary) hypertension: Secondary | ICD-10-CM | POA: Diagnosis not present

## 2020-01-26 DIAGNOSIS — E876 Hypokalemia: Secondary | ICD-10-CM | POA: Diagnosis not present

## 2020-01-26 DIAGNOSIS — I2510 Atherosclerotic heart disease of native coronary artery without angina pectoris: Secondary | ICD-10-CM | POA: Diagnosis not present

## 2020-01-26 DIAGNOSIS — E785 Hyperlipidemia, unspecified: Secondary | ICD-10-CM

## 2020-01-26 DIAGNOSIS — R55 Syncope and collapse: Secondary | ICD-10-CM | POA: Diagnosis not present

## 2020-01-26 MED ORDER — POTASSIUM CHLORIDE ER 10 MEQ PO TBCR: 20.0000 meq | EXTENDED_RELEASE_TABLET | Freq: Two times a day (BID) | ORAL | 0 refills | Status: DC

## 2020-01-26 NOTE — Patient Instructions (Addendum)
Medication Instructions:  No medication changes today.   I have sent a refill of your potassium. I sent a 30 day supply so we can check your labs at your next office visit to be sure your potassium level is stable, then we can do further refills for larger quantity.   *If you need a refill on your cardiac medications before your next appointment, please call your pharmacy*  Lab Work: No lab work ordered today.  We will plan to collect labs at your upcoming office visit. You do not need to be fasting.   Testing/Procedures: None ordered today.   Follow-Up: At [facility], you and your health needs are our priority.  As part of our continuing mission to provide you with exceptional heart care, we have created designated Provider Care Teams.  These Care Teams include your primary Cardiologist (physician) and Advanced Practice Providers (APPs -  Physician Assistants and Nurse Practitioners) who all work together to provide you with the care you need, when you need it.  Your next appointment:   March 4th, 2021 with Dr. Fletcher Anon at 2:00 PM  Other Instructions Please bring your blood pressure cuff and log of blood pressures to your office visit.

## 2020-01-26 NOTE — Progress Notes (Signed)
Virtual Visit via Telephone Note   This visit type was conducted due to national recommendations for restrictions regarding the COVID-19 Pandemic (e.g. social distancing) in an effort to limit this patient's exposure and mitigate transmission in our community.  Due to her co-morbid illnesses, this patient is at least at moderate risk for complications without adequate follow up.  This format is felt to be most appropriate for this patient at this time.  The patient did not have access to video technology/had technical difficulties with video requiring transitioning to audio format only (telephone).  All issues noted in this document were discussed and addressed.  No physical exam could be performed with this format.  Please refer to the patient's chart for her  consent to telehealth for [facility].   Date:  01/26/2020   ID:  Andrea Hart, DOB Feb 28, 1936, MRN [id number]  Patient Location: Home Provider Location: Office  PCP:  Burnard Hawthorne, FNP  Cardiologist:  Kathlyn Sacramento, MD  Electrophysiologist:  None   Evaluation Performed:  Follow-Up Visit  Chief Complaint:  Hypertension  History of Present Illness:    Andrea Hart is a 84 y.o. female with a hx of CAD s/p stent of unknown vessel in [place] in 2013, HTN, HLD, asthma, hypothyroidism, CVAx2 with loop recorder to rule out atrial fibrillation, near-syncope. She was last seen 01/08/20 by Ignacia Bayley, NP.   Seen in clinic 01/02/20 with episode of diaphoresis, anxiety, lightheadedness. Felt better after eating some crackers. Noted labile blood pressure at this office visit and medication times were adjusted.   Seen in follow up 01/08/20 with BP typical 150s-170s. Noted another episode of lightheadedness and anxiety without diaphoresis. Her atenolol was transitioned to Carvedilol.  She reports feeling well. No recurrent pre-syncope episodes. No lightheadedness nor dizziness. Tells me she thinks these are all associated with not eating  enough or taking multiple medications without eating. Discussed importance of small, regular meals and adequate hydration. Preliminary review of ZIO shows infrequent PVC and short episodes of VT which were not triggered. She does report intermittent palpitations which are not bothersome.    No chest pain, pressure, tightness. No shortness of breath nor DOE.  Taking BP meds 9am and 9pm. BP ranging 121/81 - 194/103. Very labile readings. No lightheadedness, dizziness, headaches. She is checking her BP twice per day approximately 10a-11a and 11p-midnight.   She tells me she will bring BP cuff to next office visit. Concerned that her BP cuff is not accurate as readings are very labile and not consistent with what we get in the office.   The patient does not have symptoms concerning for COVID-19 infection (fever, chills, cough, or new shortness of breath).    Past Medical History:  Diagnosis Date  . Asthma   . Coronary artery disease    a. 2013 s/p PCI and stent placement in [facility] done for stable angina.  . Hyperlipidemia   . Hypertension   . Hypothyroidism   . Stroke [facility])    a. 2017 - ILR did not show afib.   Past Surgical History:  Procedure Laterality Date  . CATARACT EXTRACTION Bilateral   . CORONARY ANGIOPLASTY  2013   1xStent [facility] [place].  Marland Kitchen HERNIA REPAIR  2016  . LOOP RECORDER IMPLANT    . REPLACEMENT TOTAL KNEE Bilateral 2017  . TOTAL HIP ARTHROPLASTY Right   . TOTAL VAGINAL HYSTERECTOMY       No outpatient medications have been marked as taking for the 01/26/20  encounter (Appointment) with Loel Dubonnet, NP.     Allergies:   Patient has no known allergies.   Social History   Tobacco Use  . Smoking status: Former Research scientist (life sciences)  . Smokeless tobacco: Never Used  Substance Use Topics  . Alcohol use: Not Currently    Comment: Occasional  . Drug use: Never     Family Hx: The patient's family history includes Heart Problems in her brother,  father, and mother; Heart attack in her brother and mother.  ROS:   Please see the history of present illness.     Review of Systems  Constitution: Negative for chills, fever and malaise/fatigue.  Cardiovascular: Positive for palpitations. Negative for chest pain, dyspnea on exertion, leg swelling, near-syncope, orthopnea and syncope.  Respiratory: Negative for cough, shortness of breath and wheezing.   Gastrointestinal: Negative for nausea and vomiting.  Neurological: Negative for dizziness, light-headedness and weakness.   All other systems reviewed and are negative.   Labs/Other Tests and Data Reviewed:    EKG:  No ECG reviewed.  Recent Labs: 12/19/2019: ALT 15; BUN 20; Creatinine, Ser 1.37; Hemoglobin 11.6; Platelets 258.0; Potassium 4.8; Sodium 141; TSH 1.71   Recent Lipid Panel Lab Results  Component Value Date/Time   CHOL 183 12/19/2019 09:57 AM   TRIG 182.0 (H) 12/19/2019 09:57 AM   HDL 59.60 12/19/2019 09:57 AM   CHOLHDL 3 12/19/2019 09:57 AM   LDLCALC 87 12/19/2019 09:57 AM    Wt Readings from Last 3 Encounters:  01/08/20 142 lb 4 oz (64.5 kg)  01/02/20 143 lb (64.9 kg)  12/29/19 144 lb (65.3 kg)     Objective:    Vital Signs:  There were no vitals taken for this visit.   VITAL SIGNS:  reviewed  ASSESSMENT & PLAN:    1. HTN - BP remains elevated at home. Very labile readings. Suspicion her home BP cuff is not accurate, she was asked to bring her BP cuff to next office visit. Low suspicion we will be able to increase Coreg due to SB 52 bpm on most recent EKG. We discussed medication changes such as increasing Enalapril vs transition from Nifedipine to Amlodipine. She is hesitant to make additional changes at this time and prefers to check her BP cuff at next office visit first.   2. Presyncope - ZIO AT worn to assess for bradycardia or tachyarrhythmias. Prelimiary review shows PVC and short bursts of VT. No recurrent symptoms. Previous episodes seemed to be  associated with not eating recently, regular meals and hydration encouraged.   3. CAD - S/p prior stenting 2013. No anginal symptoms. GDMT aspirin, beta blocker, statin, Plavix.   4. HLD - 12/19/19 LFT normal and LDL 87. As such, switched to Rosuvastatin on 01/08/20. Repeat lipid/LFT approximately 02/19/20.  5. Hx CVA - Continue aspirin, statin, Plavix. Prior loop recorder with no evidence of atrial fib.   6. Anemia - Noted history. Previously had orders for labs at PCP. As we will be collecting BMET at next office visit I have discussed with her PCP and we will collect CBC and iron panel as well.  7. Hypokalemia - Likely due to chlorthalidone. K 4mEq BID refilled today. BMET at office visit next week.   COVID-19 Education: The signs and symptoms of COVID-19 were discussed with the patient and how to seek care for testing (follow up with PCP or arrange E-visit).  The importance of social distancing was discussed today.  Time:   Today, I have spent 15  minutes with the patient with telehealth technology discussing the above problems.     Medication Adjustments/Labs and Tests Ordered: Current medicines are reviewed at length with the patient today.  Concerns regarding medicines are outlined above.   Tests Ordered: No orders of the defined types were placed in this encounter.   Medication Changes: No orders of the defined types were placed in this encounter.   Follow Up:  Follow up 02/05/20 with Dr. Fletcher Anon as previously scheduled.   Signed, Loel Dubonnet, NP  01/26/2020 1:23 PM    [facility]

## 2020-01-27 DIAGNOSIS — I639 Cerebral infarction, unspecified: Secondary | ICD-10-CM | POA: Diagnosis not present

## 2020-01-28 ENCOUNTER — Other Ambulatory Visit: Payer: Self-pay

## 2020-02-05 ENCOUNTER — Ambulatory Visit (INDEPENDENT_AMBULATORY_CARE_PROVIDER_SITE_OTHER): Payer: Medicare Other | Admitting: Cardiovascular Disease

## 2020-02-05 ENCOUNTER — Encounter: Payer: Self-pay | Admitting: Cardiovascular Disease

## 2020-02-05 ENCOUNTER — Other Ambulatory Visit: Payer: Self-pay

## 2020-02-05 ENCOUNTER — Other Ambulatory Visit
Admission: RE | Admit: 2020-02-05 | Discharge: 2020-02-05 | Disposition: A | Discharge status: Home / Self Care | Payer: Medicare Other | Source: Ambulatory Visit | Attending: Family | Admitting: Family

## 2020-02-05 VITALS — BP 168/104 | HR 79 | Ht 62.0 in | Wt 139.0 lb

## 2020-02-05 DIAGNOSIS — E876 Hypokalemia: Secondary | ICD-10-CM | POA: Diagnosis not present

## 2020-02-05 DIAGNOSIS — D649 Anemia, unspecified: Secondary | ICD-10-CM | POA: Insufficient documentation

## 2020-02-05 DIAGNOSIS — E785 Hyperlipidemia, unspecified: Secondary | ICD-10-CM | POA: Diagnosis not present

## 2020-02-05 DIAGNOSIS — I10 Essential (primary) hypertension: Secondary | ICD-10-CM

## 2020-02-05 DIAGNOSIS — I493 Ventricular premature depolarization: Secondary | ICD-10-CM | POA: Diagnosis not present

## 2020-02-05 DIAGNOSIS — I2510 Atherosclerotic heart disease of native coronary artery without angina pectoris: Secondary | ICD-10-CM

## 2020-02-05 LAB — CBC
HCT: 33.7 % — ABNORMAL LOW (ref 36.0–46.0)
Hemoglobin: 11 g/dL — ABNORMAL LOW (ref 12.0–15.0)
MCH: 30.5 pg (ref 26.0–34.0)
MCHC: 32.6 g/dL (ref 30.0–36.0)
MCV: 93.4 fL (ref 80.0–100.0)
Platelets: 277 10*3/uL (ref 150–400)
RBC: 3.61 MIL/uL — ABNORMAL LOW (ref 3.87–5.11)
RDW: 12.8 % (ref 11.5–15.5)
WBC: 6.8 10*3/uL (ref 4.0–10.5)
nRBC: 0 % (ref 0.0–0.2)

## 2020-02-05 LAB — IRON AND TIBC
Iron: 82 ug/dL (ref 28–170)
Saturation Ratios: 25 % (ref 10.4–31.8)
TIBC: 335 ug/dL (ref 250–450)
UIBC: 253 ug/dL

## 2020-02-05 LAB — BASIC METABOLIC PANEL
Anion gap: 6 (ref 5–15)
BUN: 22 mg/dL (ref 8–23)
CO2: 25 mmol/L (ref 22–32)
Calcium: 9.4 mg/dL (ref 8.9–10.3)
Chloride: 108 mmol/L (ref 98–111)
Creatinine, Ser: 1.3 mg/dL — ABNORMAL HIGH (ref 0.44–1.00)
GFR calc Af Amer: 45 mL/min — ABNORMAL LOW (ref >60)
GFR calc non Af Amer: 38 mL/min — ABNORMAL LOW (ref >60)
Glucose, Bld: 112 mg/dL — ABNORMAL HIGH (ref 70–99)
Potassium: 5 mmol/L (ref 3.5–5.1)
Sodium: 139 mmol/L (ref 135–145)

## 2020-02-05 LAB — FERRITIN: Ferritin: 134 ng/mL (ref 11–307)

## 2020-02-05 MED ORDER — CARVEDILOL 6.25 MG PO TABS: 6.2500 mg | ORAL_TABLET | Freq: Two times a day (BID) | ORAL | 2 refills | Status: DC

## 2020-02-05 NOTE — Progress Notes (Signed)
Cardiology Office Note   Date:  02/05/2020   ID:  Andrea Hart, DOB Dec 11, 1935, MRN [id number]  PCP:  Burnard Hawthorne, FNP  Cardiologist:   Kathlyn Sacramento, MD   Chief Complaint  Patient presents with  . office visit    1 month F/U; Meds verbally reviewed with patient.      History of Present Illness: Andrea Hart is a 84 y.o. female who is here today for follow-up visit regarding coronary artery disease.    She moved from [place] in 2019.  She has known history of coronary artery disease with previous stent placement in 2013 done for stable angina.  She has chronic medical conditions that include asthma, hypertension, hyperlipidemia, hypothyroidism and stroke twice.  She had a loop recorder done for detection of atrial fibrillation.  She is not a smoker and she has family history for coronary artery disease.  She was seen in January for an episode of sudden diaphoresis, anxiety and lightheadedness.  It was thought to be due to low blood sugar.  She did have labile hypertension after that. Atenolol was switched to carvedilol.  Due to presyncope, she had a 14-day monitor which showed 1 short run of SVT and frequent PVCs with an overall burden of 10%. She has been doing very well with no chest pain, shortness of breath or palpitations.  She has not had any recurrent episodes of presyncope.  Past Medical History:  Diagnosis Date  . Asthma   . Coronary artery disease    a. 2013 s/p PCI and stent placement in [facility] done for stable angina.  . Hyperlipidemia   . Hypertension   . Hypothyroidism   . Stroke [facility])    a. 2017 - ILR did not show afib.    Past Surgical History:  Procedure Laterality Date  . CATARACT EXTRACTION Bilateral   . CORONARY ANGIOPLASTY  2013   1xStent [facility] [place].  Marland Kitchen HERNIA REPAIR  2016  . LOOP RECORDER IMPLANT    . REPLACEMENT TOTAL KNEE Bilateral 2017  . TOTAL HIP ARTHROPLASTY Right   . TOTAL VAGINAL HYSTERECTOMY        Current Outpatient Medications  Medication Sig Dispense Refill  . albuterol (VENTOLIN HFA) 108 (90 Base) MCG/ACT inhaler Inhale 2 puffs into the lungs every 4 (four) hours as needed for wheezing or shortness of breath. 6.7 g 2  . aspirin EC 81 MG tablet Take 81 mg by mouth daily.    . carvedilol (COREG) 3.125 MG tablet Take 1 tablet (3.125 mg total) by mouth 2 (two) times daily with a meal. 180 tablet 3  . cetirizine (ZYRTEC) 10 MG tablet Take 1 tablet (10 mg total) by mouth daily. 30 tablet 0  . chlorthalidone (HYGROTON) 25 MG tablet Take 1 tablet (25 mg) by mouth once daily in the morning 90 tablet 1  . clopidogrel (PLAVIX) 75 MG tablet Take 1 tablet (75 mg total) by mouth daily. 90 tablet 1  . diclofenac Sodium (VOLTAREN) 1 % GEL Apply 4 g topically 4 (four) times daily. 50 g 1  . enalapril (VASOTEC) 10 MG tablet Take 1 tablet (10 mg total) by mouth 2 (two) times daily. 90 tablet 3  . FeFum-FePoly-FA-B Cmp-C-Biot (INTEGRA PLUS) CAPS Take 1 capsule by mouth daily. 90 capsule 1  . levothyroxine (SYNTHROID) 88 MCG tablet TAKE ONE TABLET BY MOUTH DAILY BEFORE BREAKFAST 90 tablet 1  . montelukast (SINGULAIR) 10 MG tablet Take 1 tablet (10 mg total) by  mouth at bedtime. 90 tablet 1  . Multiple Vitamin (MULTIVITAMIN) capsule Take 1 capsule by mouth daily.    Marland Kitchen NIFEdipine (ADALAT CC) 60 MG 24 hr tablet Take 1 tablet (60 mg) by mouth once daily in the evening 90 tablet 1  . omeprazole (PRILOSEC) 40 MG capsule Take 1 capsule (40 mg total) by mouth 2 (two) times daily. 120 capsule 2  . potassium chloride (KLOR-CON) 10 MEQ tablet Take 2 tablets (20 mEq total) by mouth 2 (two) times daily. 120 tablet 0  . rosuvastatin (CRESTOR) 20 MG tablet Take 1 tablet (20 mg total) by mouth daily. 90 tablet 3  . VITAMIN D PO Take by mouth daily.     No current facility-administered medications for this visit.    Allergies:   Patient has no known allergies.    Social History:  The patient  reports that she  has quit smoking. She has never used smokeless tobacco. She reports previous alcohol use. She reports that she does not use drugs.   Family History:  The patient's family history includes Heart Problems in her brother, father, and mother; Heart attack in her brother and mother.    ROS:  Please see the history of present illness.   Otherwise, review of systems are positive for none.   All other systems are reviewed and negative.    PHYSICAL EXAM: VS:  BP (!) 168/104 (BP Location: Left Arm, Patient Position: Sitting, Cuff Size: Normal)   Pulse 79   Ht 5\' 2"  (1.575 m)   Wt 139 lb (63 kg)   SpO2 99%   BMI 25.42 kg/m  , BMI Body mass index is 25.42 kg/m. GEN: Well nourished, well developed, in no acute distress  HEENT: normal  Neck: no JVD, carotid bruits, or masses Cardiac: RRR; no rubs, or gallops,no edema .  1 out of 6 systolic murmur in the aortic area Respiratory:  clear to auscultation bilaterally, normal work of breathing GI: soft, nontender, nondistended, + BS MS: no deformity or atrophy  Skin: warm and dry, no rash Neuro:  Strength and sensation are intact Psych: euthymic mood, full affect   EKG:  EKG is ordered today. The ekg ordered today demonstrates normal sinus rhythm with nonspecific T wave changes.  No PVCs.   Recent Labs: 12/19/2019: ALT 15; BUN 20; Creatinine, Ser 1.37; Hemoglobin 11.6; Platelets 258.0; Potassium 4.8; Sodium 141; TSH 1.71    Lipid Panel    Component Value Date/Time   CHOL 183 12/19/2019 0957   TRIG 182.0 (H) 12/19/2019 0957   HDL 59.60 12/19/2019 0957   CHOLHDL 3 12/19/2019 0957   VLDL 36.4 12/19/2019 0957   LDLCALC 87 12/19/2019 0957      Wt Readings from Last 3 Encounters:  02/05/20 139 lb (63 kg)  01/26/20 142 lb (64.4 kg)  01/08/20 142 lb 4 oz (64.5 kg)       PAD Screen 11/12/2018  Previous PAD dx? No  Previous surgical procedure? No  Pain with walking? No  Feet/toe relief with dangling? No  Painful, non-healing ulcers? No   Extremities discolored? No      ASSESSMENT AND PLAN:  1.  Coronary artery disease involving native coronary arteries without angina: She is overall doing well with no anginal symptoms.  Continue medical therapy.  2.  Previous stroke: She has a loop recorder in place with no evidence of atrial fibrillation.  This was monitored via Baldo Ash at some point but she had it done 3 years ago.  I do not think we will need to continue monitoring this.  3.  Hyperlipidemia: Simvastatin was switched to rosuvastatin.  4.  Essential hypertension: Blood pressure continues to be elevated although it is labile at home with lower readings in the evening.  I increased carvedilol to 6.25 mg twice daily.  5.  PVCs: These were noted on the monitor.  She reports that she had PVCs all her life and she does not appear to be symptomatic.  The burden on the monitor was high at 10%.  However, I am not entirely sure that is accurate at the present time as I do not see any evidence of PVCs on her EKG or by physical exam.   Disposition:   FU with me in 4 months  Signed,  Kathlyn Sacramento, MD  02/05/2020 2:19 PM    [place]

## 2020-02-05 NOTE — Addendum Note (Signed)
Addended by: Santiago Bur on: 02/05/2020 02:57 PM   Modules accepted: Orders

## 2020-02-05 NOTE — Patient Instructions (Signed)
Medication Instructions:  Your physician has recommended you make the following change in your medication:   INCREASE Carvedilol to 6.25 mg twice daily. An Rx has been sent to your pharmacy.  *If you need a refill on your cardiac medications before your next appointment, please call your pharmacy*   Lab Work: None ordered If you have labs (blood work) drawn today and your tests are completely normal, you will receive your results only by: Marland Kitchen MyChart Message (if you have MyChart) OR . A paper copy in the mail If you have any lab test that is abnormal or we need to change your treatment, we will call you to review the results.   Testing/Procedures: None ordered   Follow-Up: At [facility], you and your health needs are our priority.  As part of our continuing mission to provide you with exceptional heart care, we have created designated Provider Care Teams.  These Care Teams include your primary Cardiologist (physician) and Advanced Practice Providers (APPs -  Physician Assistants and Nurse Practitioners) who all work together to provide you with the care you need, when you need it.  We recommend signing up for the patient portal called "MyChart".  Sign up information is provided on this After Visit Summary.  MyChart is used to connect with patients for Virtual Visits (Telemedicine).  Patients are able to view lab/test results, encounter notes, upcoming appointments, etc.  Non-urgent messages can be sent to your provider as well.   To learn more about what you can do with MyChart, go to [email or web address].    Your next appointment:   4 month(s)  The format for your next appointment:   In Person  Provider:    You may see Kathlyn Sacramento, MD or one of the following Advanced Practice Providers on your designated Care Team:    Murray Hodgkins, NP  Christell Faith, PA-C  Marrianne Mood, PA-C    Other Instructions N/A

## 2020-02-09 ENCOUNTER — Other Ambulatory Visit: Payer: Self-pay | Admitting: Family

## 2020-02-09 DIAGNOSIS — D649 Anemia, unspecified: Secondary | ICD-10-CM

## 2020-02-10 ENCOUNTER — Other Ambulatory Visit: Payer: Self-pay | Admitting: Family

## 2020-02-10 DIAGNOSIS — N189 Chronic kidney disease, unspecified: Secondary | ICD-10-CM

## 2020-02-11 ENCOUNTER — Telehealth: Payer: Self-pay | Admitting: Family

## 2020-02-11 NOTE — Telephone Encounter (Signed)
Pt daughter called wanting to talk about the results her mother was given yesterday

## 2020-02-11 NOTE — Telephone Encounter (Signed)
I spoke to patient's daughter per DPR to reassure her that her mom's kidney disease just needed to be monitored. Her mom didn't fully understand all I went over yesterday & was nervous about her kidney function. I told her that referral was being placed & she should here about an appointment. Pt's daughter verbalized understanding.

## 2020-02-22 ENCOUNTER — Other Ambulatory Visit: Payer: Self-pay | Admitting: Family

## 2020-02-22 DIAGNOSIS — I10 Essential (primary) hypertension: Secondary | ICD-10-CM

## 2020-02-26 ENCOUNTER — Telehealth: Payer: Self-pay | Admitting: Family

## 2020-02-26 NOTE — Telephone Encounter (Signed)
Left message for patient to call back and schedule Medicare Annual Wellness Visit (AWV) either virtually or audio only.  No hx of AWV; please schedule at anytime with Denisa O'Brien-Blaney at [place]   

## 2020-03-18 ENCOUNTER — Encounter: Payer: Self-pay | Admitting: Family

## 2020-03-19 ENCOUNTER — Other Ambulatory Visit: Payer: Self-pay | Admitting: Family

## 2020-03-19 ENCOUNTER — Telehealth: Payer: Self-pay

## 2020-03-19 DIAGNOSIS — G8929 Other chronic pain: Secondary | ICD-10-CM

## 2020-03-19 NOTE — Telephone Encounter (Signed)
Close  

## 2020-03-19 NOTE — Telephone Encounter (Signed)
LM for Candace to call back.

## 2020-03-19 NOTE — Telephone Encounter (Signed)
Candace returned your call

## 2020-03-22 ENCOUNTER — Telehealth: Payer: Self-pay

## 2020-03-22 NOTE — Telephone Encounter (Signed)
LM with daughter regarding mychart message to please call back.

## 2020-03-24 ENCOUNTER — Other Ambulatory Visit: Payer: Self-pay | Admitting: Family

## 2020-03-24 DIAGNOSIS — M25512 Pain in left shoulder: Secondary | ICD-10-CM

## 2020-03-24 DIAGNOSIS — G8929 Other chronic pain: Secondary | ICD-10-CM

## 2020-03-24 MED ORDER — LIDOCAINE 5 % EX PTCH: 1.0000 | MEDICATED_PATCH | CUTANEOUS | 0 refills | Status: DC

## 2020-03-28 ENCOUNTER — Other Ambulatory Visit: Payer: Self-pay | Admitting: Family

## 2020-03-28 DIAGNOSIS — I10 Essential (primary) hypertension: Secondary | ICD-10-CM

## 2020-04-05 ENCOUNTER — Ambulatory Visit: Payer: Medicare Other | Admitting: Family

## 2020-04-09 DIAGNOSIS — M47812 Spondylosis without myelopathy or radiculopathy, cervical region: Secondary | ICD-10-CM | POA: Diagnosis not present

## 2020-04-09 DIAGNOSIS — M542 Cervicalgia: Secondary | ICD-10-CM | POA: Diagnosis not present

## 2020-04-09 DIAGNOSIS — M25512 Pain in left shoulder: Secondary | ICD-10-CM | POA: Diagnosis not present

## 2020-04-09 DIAGNOSIS — M19012 Primary osteoarthritis, left shoulder: Secondary | ICD-10-CM | POA: Diagnosis not present

## 2020-04-19 ENCOUNTER — Other Ambulatory Visit: Payer: Self-pay | Admitting: Nephrology

## 2020-04-19 DIAGNOSIS — R809 Proteinuria, unspecified: Secondary | ICD-10-CM | POA: Diagnosis not present

## 2020-04-19 DIAGNOSIS — N1832 Chronic kidney disease, stage 3b: Secondary | ICD-10-CM

## 2020-04-19 DIAGNOSIS — I10 Essential (primary) hypertension: Secondary | ICD-10-CM | POA: Diagnosis not present

## 2020-04-23 DIAGNOSIS — G8929 Other chronic pain: Secondary | ICD-10-CM | POA: Diagnosis not present

## 2020-04-23 DIAGNOSIS — M25512 Pain in left shoulder: Secondary | ICD-10-CM | POA: Diagnosis not present

## 2020-04-23 DIAGNOSIS — M19012 Primary osteoarthritis, left shoulder: Secondary | ICD-10-CM | POA: Diagnosis not present

## 2020-04-29 ENCOUNTER — Other Ambulatory Visit: Payer: Self-pay

## 2020-04-29 ENCOUNTER — Ambulatory Visit
Admission: RE | Admit: 2020-04-29 | Discharge: 2020-04-29 | Disposition: A | Discharge status: Home / Self Care | Payer: Medicare Other | Source: Ambulatory Visit | Attending: Nephrology | Admitting: Nephrology

## 2020-04-29 DIAGNOSIS — N1832 Chronic kidney disease, stage 3b: Secondary | ICD-10-CM | POA: Insufficient documentation

## 2020-04-29 DIAGNOSIS — N189 Chronic kidney disease, unspecified: Secondary | ICD-10-CM | POA: Diagnosis not present

## 2020-04-29 IMAGING — US US RENAL
1 series · 14 of 25 positions shown · non-contrast
Comparison: None.

CLINICAL DATA: Chronic kidney disease.

EXAM:
RENAL / URINARY TRACT ULTRASOUND COMPLETE

[Series 1: us renal · 0.22mm/px · 14 of 36 slices shown]
[im 1/36]
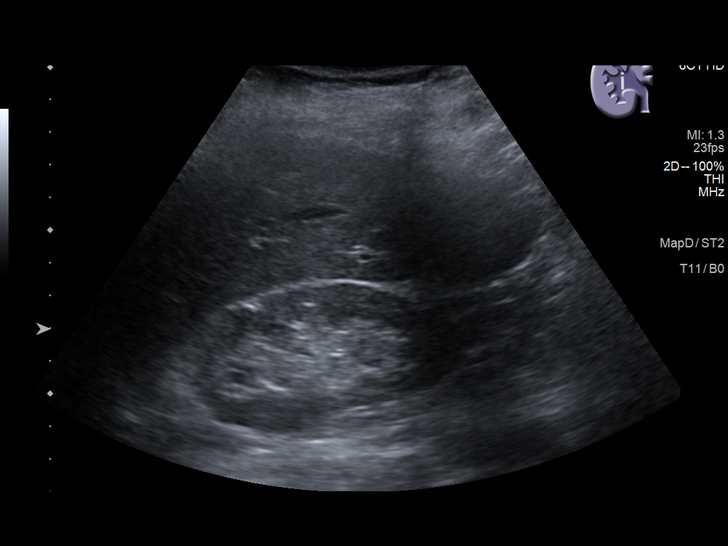
[im 3/36]
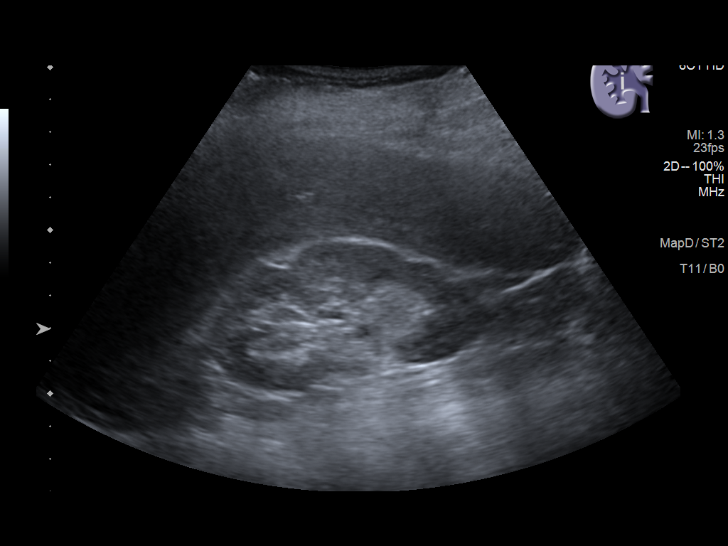
[im 6/36]
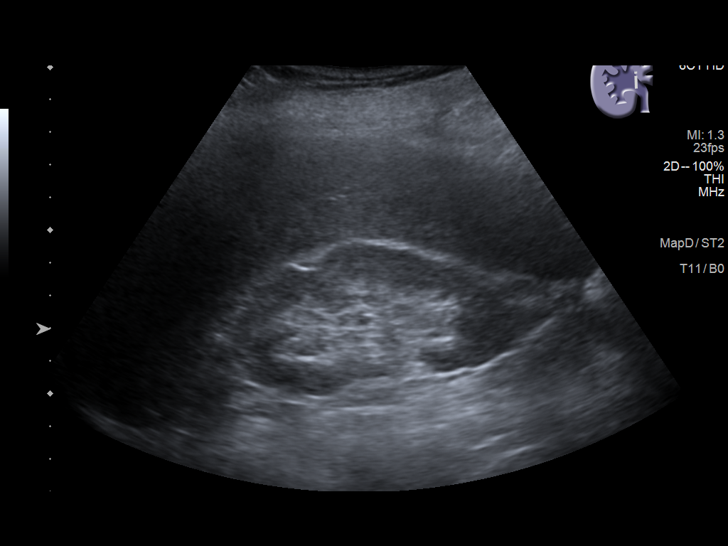
[im 9/36]
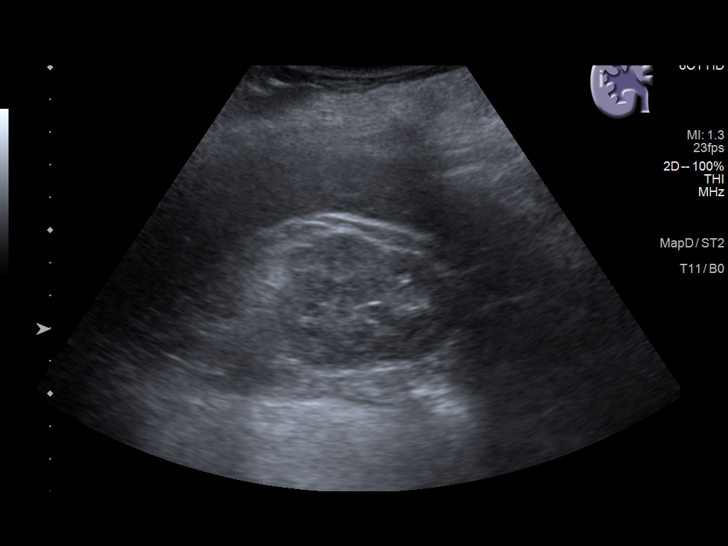
[im 12/36]
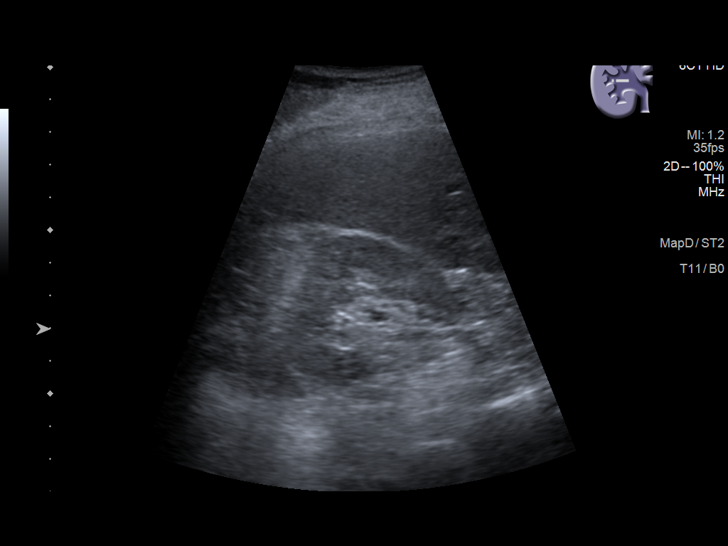
[im 14/36]
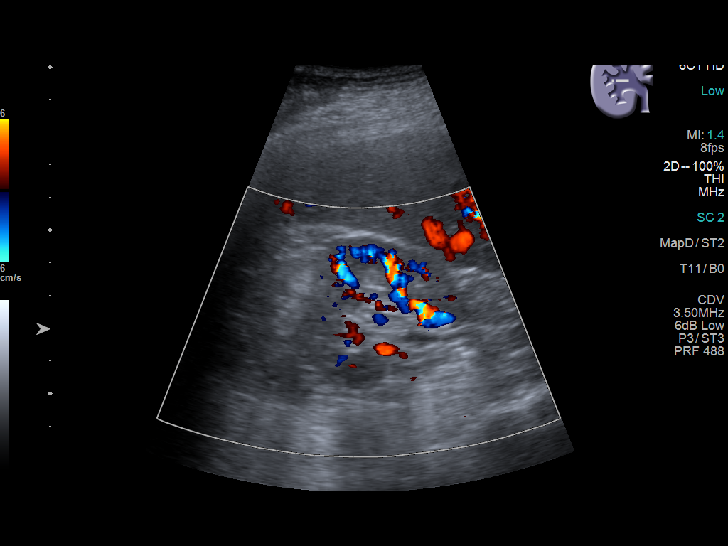
[im 17/36]
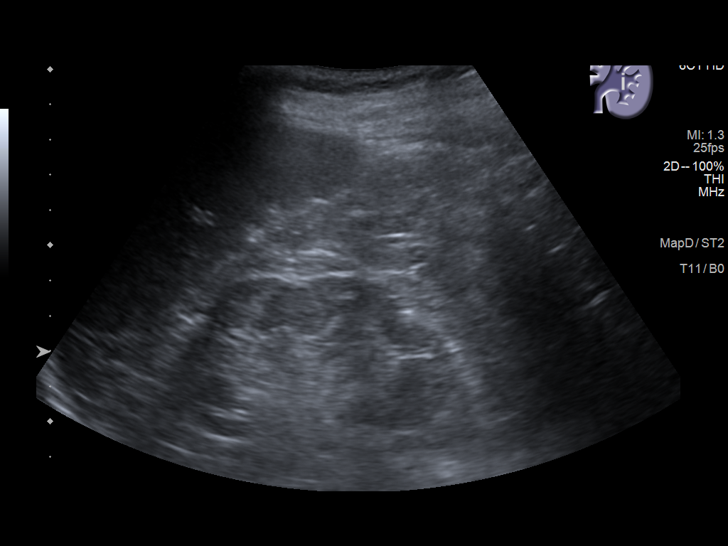
[im 19/36]
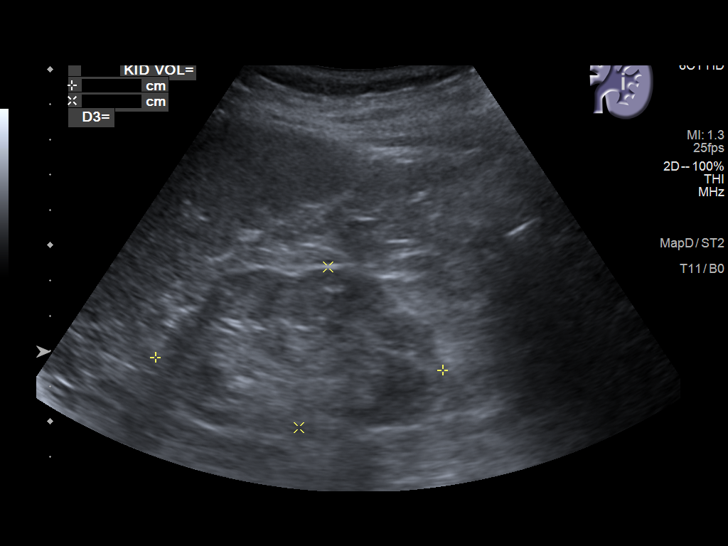
[im 22/36]
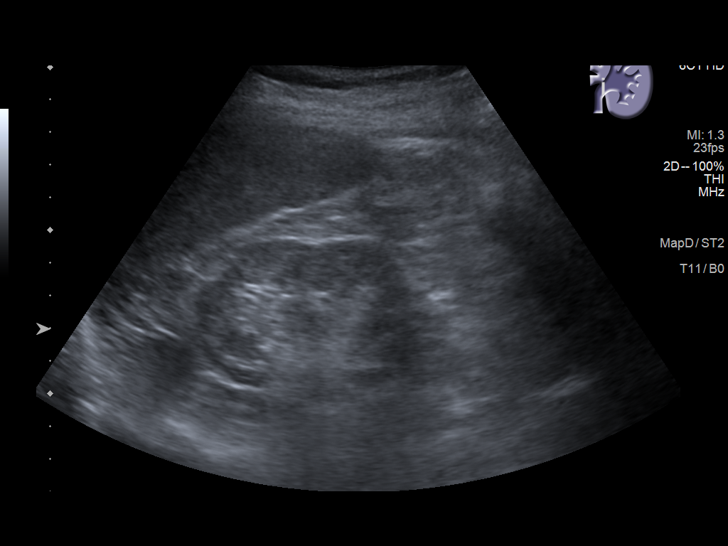
[im 24/36]
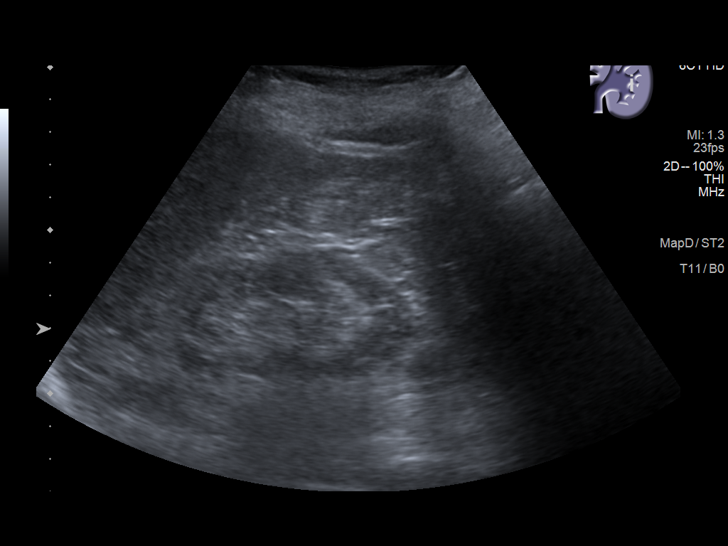
[im 27/36]
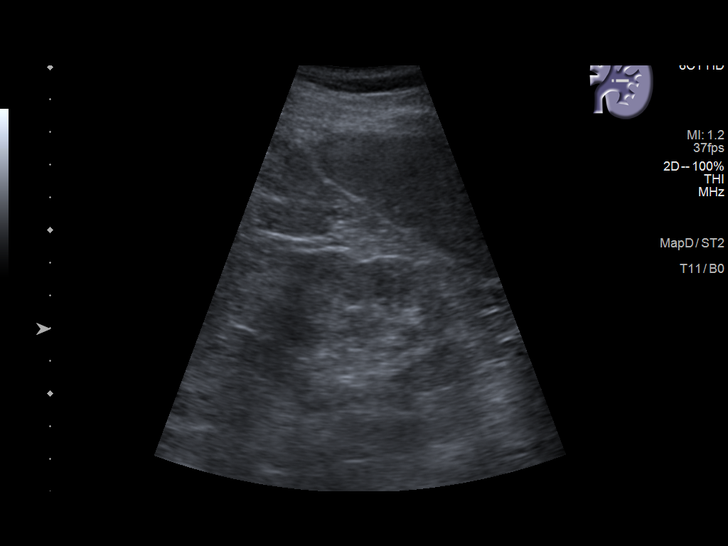
[im 30/36]
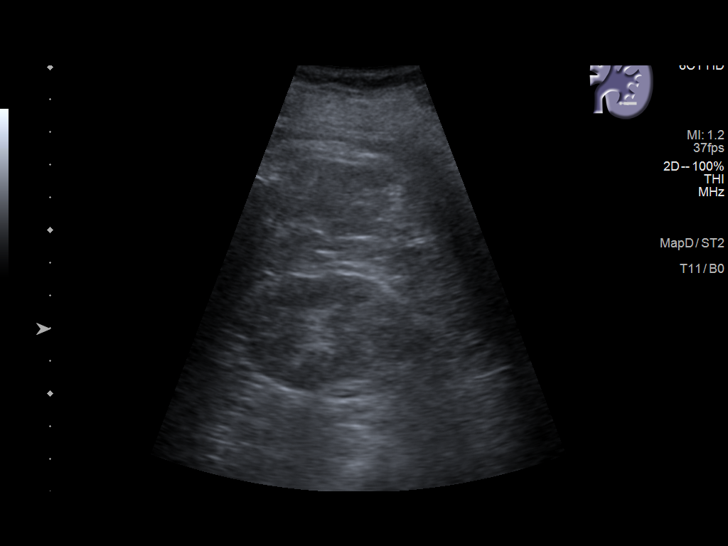
[im 33/36]
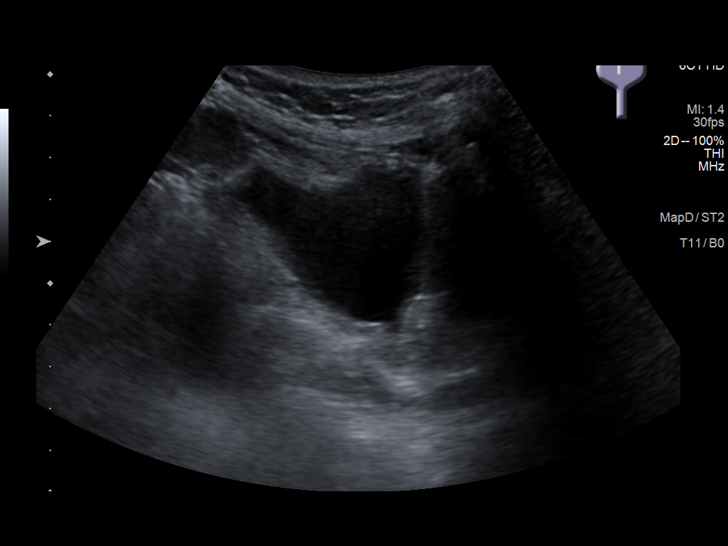
[im 36/36]
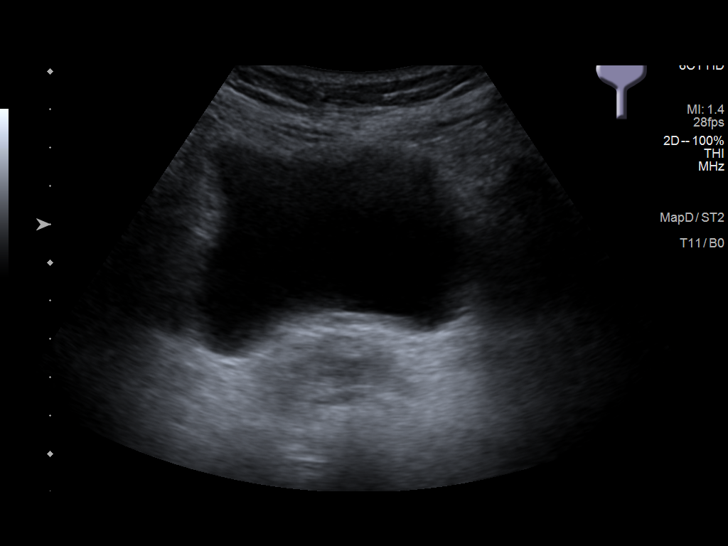

[14 of 25 positions shown; findings below may reference images not displayed]

FINDINGS: Right Kidney:

Renal measurements: 8.7 x 4.3 x 4.4 cm = volume: 84 mL. Increased
cortical echogenicity. No hydronephrosis.

Left Kidney:

Renal measurements: 8.2 x 4.6 x 4.4 cm = volume: 87 mL. Increased
cortical echogenicity. No hydronephrosis.

Bladder:

Appears normal for degree of bladder distention.

Other:

None.
IMPRESSION: Increased echogenicity of both kidneys suggest medical renal
disease. No hydronephrosis.

## 2020-05-07 DIAGNOSIS — M7051 Other bursitis of knee, right knee: Secondary | ICD-10-CM | POA: Diagnosis not present

## 2020-05-07 DIAGNOSIS — Z96651 Presence of right artificial knee joint: Secondary | ICD-10-CM | POA: Diagnosis not present

## 2020-05-07 DIAGNOSIS — M25561 Pain in right knee: Secondary | ICD-10-CM | POA: Diagnosis not present

## 2020-06-09 ENCOUNTER — Ambulatory Visit: Payer: Medicare Other | Admitting: Family Medicine

## 2020-06-10 ENCOUNTER — Other Ambulatory Visit: Payer: Self-pay

## 2020-06-10 ENCOUNTER — Ambulatory Visit (INDEPENDENT_AMBULATORY_CARE_PROVIDER_SITE_OTHER): Payer: Medicare Other | Admitting: Cardiovascular Disease

## 2020-06-10 ENCOUNTER — Encounter: Payer: Self-pay | Admitting: Cardiovascular Disease

## 2020-06-10 VITALS — BP 184/90 | HR 69 | Ht 62.0 in | Wt 140.1 lb

## 2020-06-10 DIAGNOSIS — I10 Essential (primary) hypertension: Secondary | ICD-10-CM | POA: Diagnosis not present

## 2020-06-10 DIAGNOSIS — I2510 Atherosclerotic heart disease of native coronary artery without angina pectoris: Secondary | ICD-10-CM | POA: Diagnosis not present

## 2020-06-10 DIAGNOSIS — Z9861 Coronary angioplasty status: Secondary | ICD-10-CM

## 2020-06-10 DIAGNOSIS — E039 Hypothyroidism, unspecified: Secondary | ICD-10-CM | POA: Diagnosis not present

## 2020-06-10 DIAGNOSIS — J4520 Mild intermittent asthma, uncomplicated: Secondary | ICD-10-CM

## 2020-06-10 DIAGNOSIS — I493 Ventricular premature depolarization: Secondary | ICD-10-CM

## 2020-06-10 MED ORDER — AMLODIPINE BESYLATE 10 MG PO TABS: 10.0000 mg | ORAL_TABLET | Freq: Every day | ORAL | 1 refills | Status: DC

## 2020-06-10 MED ORDER — ENALAPRIL MALEATE 10 MG PO TABS: 10.0000 mg | ORAL_TABLET | Freq: Two times a day (BID) | ORAL | 1 refills | Status: DC

## 2020-06-10 MED ORDER — ROSUVASTATIN CALCIUM 20 MG PO TABS: 20.0000 mg | ORAL_TABLET | Freq: Every day | ORAL | 1 refills | Status: DC

## 2020-06-10 MED ORDER — CARVEDILOL 6.25 MG PO TABS: 6.2500 mg | ORAL_TABLET | Freq: Two times a day (BID) | ORAL | 1 refills | Status: DC

## 2020-06-10 MED ORDER — MONTELUKAST SODIUM 10 MG PO TABS: 10.0000 mg | ORAL_TABLET | Freq: Every day | ORAL | 1 refills | Status: DC

## 2020-06-10 MED ORDER — POTASSIUM CHLORIDE ER 10 MEQ PO TBCR: 20.0000 meq | EXTENDED_RELEASE_TABLET | Freq: Two times a day (BID) | ORAL | 3 refills | Status: DC

## 2020-06-10 MED ORDER — CLOPIDOGREL BISULFATE 75 MG PO TABS: 75.0000 mg | ORAL_TABLET | Freq: Every day | ORAL | 1 refills | Status: DC

## 2020-06-10 MED ORDER — ALBUTEROL SULFATE HFA 108 (90 BASE) MCG/ACT IN AERS: 2.0000 | INHALATION_SPRAY | RESPIRATORY_TRACT | 1 refills | Status: DC | PRN

## 2020-06-10 MED ORDER — LEVOTHYROXINE SODIUM 88 MCG PO TABS: ORAL_TABLET | ORAL | 1 refills | Status: DC

## 2020-06-10 MED ORDER — CHLORTHALIDONE 25 MG PO TABS: ORAL_TABLET | ORAL | 1 refills | Status: DC

## 2020-06-10 NOTE — Progress Notes (Signed)
Cardiology Office Note   Date:  06/10/2020   ID:  Andrea Hart, DOB 1936/09/06, MRN [id number]  PCP:  Burnard Hawthorne, FNP  Cardiologist:   Kathlyn Sacramento, MD   Chief Complaint  Patient presents with  . office visit    Pt has been fatigue. Meds verbally reviewed w/ pt.      History of Present Illness: Andrea Hart is a 84 y.o. female who is here today for follow-up visit regarding coronary artery disease.    She moved from [place] in 2019.  She has known history of coronary artery disease with previous stent placement in 2013 done for stable angina.  She has chronic medical conditions that include asthma, hypertension, hyperlipidemia, hypothyroidism and stroke twice.  She had a loop recorder done for detection of atrial fibrillation.  She is not a smoker and she has family history for coronary artery disease.  She was seen in January for an episode of sudden diaphoresis, anxiety and lightheadedness.  It was thought to be due to low blood sugar.  She did have labile hypertension after that. Atenolol was switched to carvedilol.  Due to presyncope, she had a 14-day monitor which showed 1 short run of SVT and frequent PVCs with an overall burden of 10%. She has been dealing with significantly elevated blood pressure.  During last visit, I increased carvedilol to 6.25 mg twice daily.  Her blood pressure continues to be extremely high at home occasionally reaching 951 systolic.  That is in spite of taking carvedilol, chlorthalidone, enalapril and nifedipine.  In addition, her labs showed stage IIIb chronic kidney disease which has been gradually worsening over the last year.  She denies any chest pain or shortness of breath.    Past Medical History:  Diagnosis Date  . Asthma   . Coronary artery disease    a. 2013 s/p PCI and stent placement in [facility] done for stable angina.  . Hyperlipidemia   . Hypertension   . Hypothyroidism   . Stroke [facility])    a. 2017 - ILR  did not show afib.    Past Surgical History:  Procedure Laterality Date  . CATARACT EXTRACTION Bilateral   . CORONARY ANGIOPLASTY  2013   1xStent [facility] [place] .  Marland Kitchen HERNIA REPAIR  2016  . LOOP RECORDER IMPLANT    . REPLACEMENT TOTAL KNEE Bilateral 2017  . TOTAL HIP ARTHROPLASTY Right   . TOTAL VAGINAL HYSTERECTOMY       Current Outpatient Medications  Medication Sig Dispense Refill  . albuterol (VENTOLIN HFA) 108 (90 Base) MCG/ACT inhaler Inhale 2 puffs into the lungs every 4 (four) hours as needed for wheezing or shortness of breath. 6.7 g 2  . aspirin EC 81 MG tablet Take 81 mg by mouth daily.    . carvedilol (COREG) 6.25 MG tablet Take 1 tablet (6.25 mg total) by mouth 2 (two) times daily with a meal. 180 tablet 2  . cetirizine (ZYRTEC) 10 MG tablet Take 1 tablet (10 mg total) by mouth daily. 30 tablet 0  . chlorthalidone (HYGROTON) 25 MG tablet Take 1 tablet (25 mg) by mouth once daily in the morning 90 tablet 1  . clopidogrel (PLAVIX) 75 MG tablet Take 1 tablet (75 mg total) by mouth daily. 90 tablet 1  . diclofenac Sodium (VOLTAREN) 1 % GEL Apply 4 g topically 4 (four) times daily. 50 g 1  . enalapril (VASOTEC) 10 MG tablet Take 1 tablet (10 mg total) by  mouth 2 (two) times daily. 90 tablet 3  . FeFum-FePoly-FA-B Cmp-C-Biot (INTEGRA PLUS) CAPS Take 1 capsule by mouth daily. 90 capsule 1  . levothyroxine (SYNTHROID) 88 MCG tablet TAKE ONE TABLET BY MOUTH DAILY BEFORE BREAKFAST 90 tablet 1  . lidocaine (LIDODERM) 5 % Place 1 patch onto the skin daily. Remove & Discard patch within 12 hours. 30 patch 0  . montelukast (SINGULAIR) 10 MG tablet Take 1 tablet (10 mg total) by mouth at bedtime. 90 tablet 1  . Multiple Vitamin (MULTIVITAMIN) capsule Take 1 capsule by mouth daily.    Marland Kitchen omeprazole (PRILOSEC) 40 MG capsule Take 1 capsule (40 mg total) by mouth 2 (two) times daily. 120 capsule 2  . potassium chloride (KLOR-CON) 10 MEQ tablet TAKE TWO TABLETS BY MOUTH TWICE A DAY 120  tablet 0  . rosuvastatin (CRESTOR) 20 MG tablet Take 1 tablet (20 mg total) by mouth daily. 90 tablet 3  . VITAMIN D PO Take by mouth daily.     No current facility-administered medications for this visit.    Allergies:   Patient has no known allergies.    Social History:  The patient  reports that she has quit smoking. She has never used smokeless tobacco. She reports previous alcohol use. She reports that she does not use drugs.   Family History:  The patient's family history includes Heart Problems in her brother, father, and mother; Heart attack in her brother and mother.    ROS:  Please see the history of present illness.   Otherwise, review of systems are positive for none.   All other systems are reviewed and negative.    PHYSICAL EXAM: VS:  BP (!) 184/90 (BP Location: Left Arm, Patient Position: Sitting, Cuff Size: Normal)   Pulse 69   Ht 5\' 2"  (1.575 m)   Wt 140 lb 2 oz (63.6 kg)   SpO2 98%   BMI 25.63 kg/m  , BMI Body mass index is 25.63 kg/m. GEN: Well nourished, well developed, in no acute distress  HEENT: normal  Neck: no JVD, carotid bruits, or masses Cardiac: RRR; no rubs, or gallops,no edema .  1 / 6 systolic murmur in the aortic area Respiratory:  clear to auscultation bilaterally, normal work of breathing GI: soft, nontender, nondistended, + BS MS: no deformity or atrophy  Skin: warm and dry, no rash Neuro:  Strength and sensation are intact Psych: euthymic mood, full affect   EKG:  EKG is ordered today. The ekg ordered today demonstrates normal sinus rhythm with nonspecific ST and T wave changes..   Recent Labs: 12/19/2019: ALT 15; TSH 1.71 02/05/2020: BUN 22; Creatinine, Ser 1.30; Hemoglobin 11.0; Platelets 277; Potassium 5.0; Sodium 139    Lipid Panel    Component Value Date/Time   CHOL 183 12/19/2019 0957   TRIG 182.0 (H) 12/19/2019 0957   HDL 59.60 12/19/2019 0957   CHOLHDL 3 12/19/2019 0957   VLDL 36.4 12/19/2019 0957   LDLCALC 87  12/19/2019 0957      Wt Readings from Last 3 Encounters:  06/10/20 140 lb 2 oz (63.6 kg)  02/05/20 139 lb (63 kg)  01/26/20 142 lb (64.4 kg)       PAD Screen 11/12/2018  Previous PAD dx? No  Previous surgical procedure? No  Pain with walking? No  Feet/toe relief with dangling? No  Painful, non-healing ulcers? No  Extremities discolored? No      ASSESSMENT AND PLAN:  1.  Coronary artery disease involving native coronary arteries  without angina: She is overall doing well with no anginal symptoms.  Continue medical therapy.  2.  Previous stroke: She has a loop recorder in place with no evidence of atrial fibrillation.    3.  Hyperlipidemia: Simvastatin was switched to rosuvastatin.  4.  Essential hypertension: Blood pressure continues to be elevated in spite of 4 different antihypertensive medications.  She had renal ultrasound done recently which was unremarkable although kidney size was somewhat on the low side bilaterally.  I think it is important to exclude underlying renal artery stenosis as a culprit for recent worsening of blood pressure control.  I requested renal artery duplex.  I switch nifedipine to amlodipine 10 mg daily.  5.  PVCs: Well controlled with carvedilol with no evidence of premature beats by physical exam or on EKG.   Disposition:   FU with me in 1 months  Signed,  Kathlyn Sacramento, MD  06/10/2020 3:09 PM    [place]

## 2020-06-10 NOTE — Patient Instructions (Signed)
Medication Instructions:  Your physician recommends that you continue on your current medications as directed. Please refer to the Current Medication list given to you today.  Your medications have been refilled today.  Your non-cardiac medications have been refilled as a courtesy. Future refill request will need to be sent to your new primary care physician  *If you need a refill on your cardiac medications before your next appointment, please call your pharmacy*   Lab Work: None ordered If you have labs (blood work) drawn today and your tests are completely normal, you will receive your results only by: Marland Kitchen MyChart Message (if you have MyChart) OR . A paper copy in the mail If you have any lab test that is abnormal or we need to change your treatment, we will call you to review the results.   Testing/Procedures: Your physician has requested that you have a renal artery duplex. During this test, an ultrasound is used to evaluate blood flow to the kidneys. Allow one hour for this exam. Do not eat after midnight the day before and avoid carbonated beverages. Take your medications as you usually do.     Follow-Up: At [facility], you and your health needs are our priority.  As part of our continuing mission to provide you with exceptional heart care, we have created designated Provider Care Teams.  These Care Teams include your primary Cardiologist (physician) and Advanced Practice Providers (APPs -  Physician Assistants and Nurse Practitioners) who all work together to provide you with the care you need, when you need it.  We recommend signing up for the patient portal called "MyChart".  Sign up information is provided on this After Visit Summary.  MyChart is used to connect with patients for Virtual Visits (Telemedicine).  Patients are able to view lab/test results, encounter notes, upcoming appointments, etc.  Non-urgent messages can be sent to your provider as well.   To learn more  about what you can do with MyChart, go to [email or web address].    Your next appointment:   4 week(s)  The format for your next appointment:   In Person  Provider:    You may see Kathlyn Sacramento, MD or one of the following Advanced Practice Providers on your designated Care Team:    Murray Hodgkins, NP  Christell Faith, PA-C  Marrianne Mood, PA-C    Other Instructions N/A

## 2020-06-24 DIAGNOSIS — G8929 Other chronic pain: Secondary | ICD-10-CM | POA: Diagnosis not present

## 2020-06-24 DIAGNOSIS — M25561 Pain in right knee: Secondary | ICD-10-CM | POA: Diagnosis not present

## 2020-06-24 DIAGNOSIS — M25562 Pain in left knee: Secondary | ICD-10-CM | POA: Diagnosis not present

## 2020-07-16 ENCOUNTER — Ambulatory Visit (INDEPENDENT_AMBULATORY_CARE_PROVIDER_SITE_OTHER): Payer: Medicare Other

## 2020-07-16 ENCOUNTER — Other Ambulatory Visit: Payer: Self-pay

## 2020-07-16 DIAGNOSIS — I10 Essential (primary) hypertension: Secondary | ICD-10-CM | POA: Diagnosis not present

## 2020-07-19 ENCOUNTER — Ambulatory Visit (INDEPENDENT_AMBULATORY_CARE_PROVIDER_SITE_OTHER): Payer: Medicare Other | Admitting: Family

## 2020-07-19 ENCOUNTER — Other Ambulatory Visit: Payer: Self-pay

## 2020-07-19 ENCOUNTER — Encounter: Payer: Self-pay | Admitting: Family

## 2020-07-19 VITALS — BP 144/70 | HR 72 | Ht 62.0 in | Wt 140.0 lb

## 2020-07-19 DIAGNOSIS — E785 Hyperlipidemia, unspecified: Secondary | ICD-10-CM

## 2020-07-19 DIAGNOSIS — I10 Essential (primary) hypertension: Secondary | ICD-10-CM | POA: Diagnosis not present

## 2020-07-19 DIAGNOSIS — I25118 Atherosclerotic heart disease of native coronary artery with other forms of angina pectoris: Secondary | ICD-10-CM | POA: Diagnosis not present

## 2020-07-19 MED ORDER — CARVEDILOL 12.5 MG PO TABS: 12.5000 mg | ORAL_TABLET | Freq: Two times a day (BID) | ORAL | 1 refills | Status: DC

## 2020-07-19 NOTE — Progress Notes (Signed)
Office Visit    Patient Name: Andrea Hart Date of Encounter: 07/19/2020  Primary Care Provider:  Burnard Hawthorne, FNP Primary Cardiologist:  Kathlyn Sacramento, MD Electrophysiologist:  None   Chief Complaint    Andrea Hart is a 84 y.o. female with a hx of CAD with previous stent 2013, HTn, HLD, asthma, hypothyroidism, CVAx2 with  loop recorder to rule out atrial fibrillation presents today for follow up of BP.   Past Medical History    Past Medical History:  Diagnosis Date  . Asthma   . Coronary artery disease    a. 2013 s/p PCI and stent placement in [facility] done for stable angina.  . Hyperlipidemia   . Hypertension   . Hypothyroidism   . Stroke [facility])    a. 2017 - ILR did not show afib.   Past Surgical History:  Procedure Laterality Date  . CATARACT EXTRACTION Bilateral   . CORONARY ANGIOPLASTY  2013   1xStent [facility] [place].  Marland Kitchen HERNIA REPAIR  2016  . LOOP RECORDER IMPLANT    . REPLACEMENT TOTAL KNEE Bilateral 2017  . TOTAL HIP ARTHROPLASTY Right   . TOTAL VAGINAL HYSTERECTOMY      Allergies  No Known Allergies  History of Present Illness    Andrea Hart is a 84 y.o. female with a hx of CAD with previous stent 2013, HTn, HLD, asthma, hypothyroidism, CVAx2 with  loop recorder to rule out atrial fibrillation last seen 06/10/20 by Dr. Fletcher Anon.  Lorenda Cahill January 2021 with episode of diaphoresis, anxiety, and lightheadedness thought to be low blood sugar. Previously her Atenolol has been switched to Carvedilol. She wore a 14 day monitor due to presyncope with 1 run of SVT and frequent PVCs with burden 10%. Her coreg was increased to 6.25mg  twice daily.  Very pleasant lady who lives with her daughter. Multiple visits with difficulty controlling BP despite 4 antihypertensive agents. When last seen by Dr. Fletcher Anon 06/10/20 she was recommended to switch her nifedipine to amlodipine and obtain renal artery duplex. Renal duplex 07/16/20 without any stenosis.    Present today with her daughter for follow-up.  Reports her blood pressures have improved dramatically.  Her blood pressure has not been higher than 151 systolic which is a marked improvement.  She checks her blood pressure at least 2 hours after her medications and again in the evening.  Reports findings are consistent with systolics [id number].   She has been able to go back to her church and has been very much enjoying this.  She attributes some of her lowered blood pressure to being able to get to spend time with community and friends.  Reports no shortness of breath nor dyspnea on exertion. Reports no chest pain, pressure, or tightness. No edema, orthopnea, PND. Reports no palpitations.  EKGs/Labs/Other Studies Reviewed:   The following studies were reviewed today:  EKG:  EKG is ordered today.  The ekg ordered today demonstrates sinus bradycardia with PVC, minimal voltage criteria for LVH, no acute ST/T wave changes  Recent Labs: 12/19/2019: ALT 15; TSH 1.71 02/05/2020: BUN 22; Creatinine, Ser 1.30; Hemoglobin 11.0; Platelets 277; Potassium 5.0; Sodium 139  Recent Lipid Panel    Component Value Date/Time   CHOL 183 12/19/2019 0957   TRIG 182.0 (H) 12/19/2019 0957   HDL 59.60 12/19/2019 0957   CHOLHDL 3 12/19/2019 0957   VLDL 36.4 12/19/2019 0957   LDLCALC 87 12/19/2019 0957    Home Medications   Current Meds  Medication Sig  .  albuterol (VENTOLIN HFA) 108 (90 Base) MCG/ACT inhaler Inhale 2 puffs into the lungs every 4 (four) hours as needed for wheezing or shortness of breath.  Marland Kitchen amLODipine (NORVASC) 10 MG tablet Take 1 tablet (10 mg total) by mouth daily.  Marland Kitchen aspirin EC 81 MG tablet Take 81 mg by mouth daily.  . carvedilol (COREG) 6.25 MG tablet Take 1 tablet (6.25 mg total) by mouth 2 (two) times daily with a meal.  . cetirizine (ZYRTEC) 10 MG tablet Take 1 tablet (10 mg total) by mouth daily.  . chlorthalidone (HYGROTON) 25 MG tablet Take 1 tablet (25 mg) by mouth once daily  in the morning  . clopidogrel (PLAVIX) 75 MG tablet Take 1 tablet (75 mg total) by mouth daily.  . diclofenac Sodium (VOLTAREN) 1 % GEL Apply 4 g topically 4 (four) times daily.  . enalapril (VASOTEC) 10 MG tablet Take 1 tablet (10 mg total) by mouth 2 (two) times daily.  Marland Kitchen FeFum-FePoly-FA-B Cmp-C-Biot (INTEGRA PLUS) CAPS Take 1 capsule by mouth daily.  Marland Kitchen levothyroxine (SYNTHROID) 88 MCG tablet TAKE ONE TABLET BY MOUTH DAILY BEFORE BREAKFAST  . lidocaine (LIDODERM) 5 % Place 1 patch onto the skin daily. Remove & Discard patch within 12 hours.  . montelukast (SINGULAIR) 10 MG tablet Take 1 tablet (10 mg total) by mouth at bedtime.  . Multiple Vitamin (MULTIVITAMIN) capsule Take 1 capsule by mouth daily.  Marland Kitchen omeprazole (PRILOSEC) 40 MG capsule Take 1 capsule (40 mg total) by mouth 2 (two) times daily.  . potassium chloride (KLOR-CON) 10 MEQ tablet Take 2 tablets (20 mEq total) by mouth 2 (two) times daily.  . rosuvastatin (CRESTOR) 20 MG tablet Take 1 tablet (20 mg total) by mouth daily.  Marland Kitchen VITAMIN D PO Take by mouth daily.    Review of Systems      Review of Systems  Constitutional: Negative for chills, fever and malaise/fatigue.  Cardiovascular: Negative for chest pain, dyspnea on exertion, leg swelling, near-syncope, orthopnea, palpitations and syncope.  Respiratory: Negative for cough, shortness of breath and wheezing.   Gastrointestinal: Negative for nausea and vomiting.  Neurological: Negative for dizziness, light-headedness and weakness.   All other systems reviewed and are otherwise negative except as noted above.  Physical Exam    VS:  BP (!) 144/70 (BP Location: Left Arm, Patient Position: Sitting, Cuff Size: Normal)   Pulse 72   Ht 5\' 2"  (1.575 m)   Wt 140 lb (63.5 kg)   SpO2 96%   BMI 25.61 kg/m  , BMI Body mass index is 25.61 kg/m. GEN: Well nourished, well developed, in no acute distress. HEENT: normal. Neck: Supple, no JVD, carotid bruits, or masses. Cardiac: RRR, no  murmurs, rubs, or gallops. No clubbing, cyanosis, edema.  Radials/PT 2+ and equal bilaterally.  Respiratory:  Respirations regular and unlabored, clear to auscultation bilaterally. GI: Soft, nontender, nondistended, BS + x 4. MS: No deformity or atrophy. Skin: Warm and dry, no rash. Neuro:  Strength and sensation are intact. Psych: Normal affect.  Assessment & Plan    1. HTN -recent renal artery duplex no evidence of stenosis.  BP control markedly improved compared to prior though not yet at goal.  Increase Coreg to 12.5 mg twice daily.  Continue amlodipine 10 mg daily, chlorthalidone 25 mg daily, enalapril 10 mg daily.  2. HLD, LDL goal <70 - She was transitioned to Crestor in July. Continue Crestor 20mg  daily.  Will need repeat labs approximately in September.  3. CAD - s/p  remote stent.  No anginal symptoms, no indication for ischemic evaluation at this time.  Continue GDMT of aspirin, beta-blocker, statin.  4. Hx CVA - Has ILR in place from outside hospital. No evidence of atrial fib. Continue aspirin, statin.   Disposition: Follow up in 2 month(s) with Dr. Fletcher Anon or APP    Loel Dubonnet, NP 07/19/2020, 3:15 PM

## 2020-07-19 NOTE — Patient Instructions (Signed)
Medication Instructions:  Your physician has recommended you make the following change in your medication:   START Carvedilol (Coreg) 12.5mg  twice daily  *If you need a refill on your cardiac medications before your next appointment, please call your pharmacy*  Lab Work: No lab work today  Testing/Procedures: Your renal artery ultrasound shows no stenosis or blockages of your kidney arteries.  Follow-Up: At [facility], you and your health needs are our priority.  As part of our continuing mission to provide you with exceptional heart care, we have created designated Provider Care Teams.  These Care Teams include your primary Cardiologist (physician) and Advanced Practice Providers (APPs -  Physician Assistants and Nurse Practitioners) who all work together to provide you with the care you need, when you need it.  We recommend signing up for the patient portal called "MyChart".  Sign up information is provided on this After Visit Summary.  MyChart is used to connect with patients for Virtual Visits (Telemedicine).  Patients are able to view lab/test results, encounter notes, upcoming appointments, etc.  Non-urgent messages can be sent to your provider as well.   To learn more about what you can do with MyChart, go to [email or web address].    Your next appointment:   2 month(s)  The format for your next appointment:   In Person  Provider:   You may see Kathlyn Sacramento, MD or one of the following Advanced Practice Providers on your designated Care Team:    Murray Hodgkins, NP  Christell Faith, PA-C  Marrianne Mood, PA-C  Laurann [place], NP

## 2020-07-26 ENCOUNTER — Ambulatory Visit: Payer: Medicare Other | Admitting: Adult Health

## 2020-08-02 ENCOUNTER — Telehealth: Payer: Self-pay | Admitting: Family

## 2020-08-02 DIAGNOSIS — M65812 Other synovitis and tenosynovitis, left shoulder: Secondary | ICD-10-CM | POA: Diagnosis not present

## 2020-08-02 DIAGNOSIS — M7522 Bicipital tendinitis, left shoulder: Secondary | ICD-10-CM | POA: Diagnosis not present

## 2020-08-02 NOTE — Telephone Encounter (Signed)
Patient's daughter Alphia Moh is calling to request if the patient's medication could be refilled she had a new patient appt with Sharyn Lull and it got canceled due to [place] [facility] being out. Patient's daughter is requesting a CB to be rescheduled as a new patient when Sharyn Lull returns back to the office. CB- [phone]

## 2020-08-02 NOTE — Telephone Encounter (Deleted)
Medication: omeprazole (PRILOSEC) 40 MG capsule [[id number]] , FeFum-FePoly-FA-B C, mp-C-Biot (INTEGRA PLUS) CAPS [[id number]] , amLODipine (NORVASC) 10 MG tablet [[id number]] , chlorthalidone (HYGROTON) 25 MG tablet [[id number], ] , clopidogrel (PLAVIX) 75 MG tablet [[id number]] , enalapril (VASOTEC) 10 MG tablet [[id number]] , levothyroxine (SYNTHROID) 88 MCG tablet [[id number]] , montelukast (SINGULAIR) 10 MG tablet [[id number]] , potassium chloride (KLOR-CON) 10 MEQ tablet [[id number]] ,   Has the patient contacted their pharmacy? YES (Agent: If no, request that the patient contact the pharmacy for the refill.) (Agent: If yes, when and what did the pharmacy advise?)  Preferred Pharmacy (with phone number or street name): [facility], [place]  Phone:  [phone] Fax:  [phone]     Agent: Please be advised that RX refills may take up to 3 business days. We ask that you follow-up with your pharmacy.

## 2020-08-02 NOTE — Telephone Encounter (Signed)
Medication: omeprazole (PRILOSEC) 40 MG capsule [[id number]] , FeFum-FePoly-FA-B C, mp-C-Biot (INTEGRA PLUS) CAPS [[id number]] , amLODipine (NORVASC) 10 MG tablet [[id number]] , chlorthalidone (HYGROTON) 25 MG tablet [[id number], ] , clopidogrel (PLAVIX) 75 MG tablet [[id number]] , enalapril (VASOTEC) 10 MG tablet [[id number]] , levothyroxine (SYNTHROID) 88 MCG tablet [[id number]] , montelukast (SINGULAIR) 10 MG tablet [[id number]] , potassium chloride (KLOR-CON) 10 MEQ tablet [[id number]]     Has the patient contacted their pharmacy? YES (Agent: If no, request that the patient contact the pharmacy for the refill.) (Agent: If yes, when and what did the pharmacy advise?)  Preferred Pharmacy (with phone number or street name): [place], [place]  Phone:  [phone] Fax:  [phone]     Agent: Please be advised that RX refills may take up to 3 business days. We ask that you follow-up with your pharmacy.

## 2020-08-05 ENCOUNTER — Ambulatory Visit: Payer: Medicare Other | Admitting: Adult Health

## 2020-08-10 DIAGNOSIS — M67312 Transient synovitis, left shoulder: Secondary | ICD-10-CM | POA: Diagnosis not present

## 2020-08-13 ENCOUNTER — Ambulatory Visit (INDEPENDENT_AMBULATORY_CARE_PROVIDER_SITE_OTHER): Payer: Medicare Other | Admitting: Family Medicine

## 2020-08-13 ENCOUNTER — Other Ambulatory Visit: Payer: Self-pay

## 2020-08-13 ENCOUNTER — Encounter: Payer: Self-pay | Admitting: Family Medicine

## 2020-08-13 VITALS — BP 143/83 | HR 59 | Temp 97.8°F | Resp 16 | Ht 59.0 in | Wt 139.6 lb

## 2020-08-13 DIAGNOSIS — D649 Anemia, unspecified: Secondary | ICD-10-CM | POA: Diagnosis not present

## 2020-08-13 DIAGNOSIS — Z9861 Coronary angioplasty status: Secondary | ICD-10-CM | POA: Diagnosis not present

## 2020-08-13 DIAGNOSIS — E559 Vitamin D deficiency, unspecified: Secondary | ICD-10-CM

## 2020-08-13 DIAGNOSIS — K219 Gastro-esophageal reflux disease without esophagitis: Secondary | ICD-10-CM | POA: Diagnosis not present

## 2020-08-13 DIAGNOSIS — I2510 Atherosclerotic heart disease of native coronary artery without angina pectoris: Secondary | ICD-10-CM | POA: Diagnosis not present

## 2020-08-13 DIAGNOSIS — R2681 Unsteadiness on feet: Secondary | ICD-10-CM | POA: Diagnosis not present

## 2020-08-13 DIAGNOSIS — J4520 Mild intermittent asthma, uncomplicated: Secondary | ICD-10-CM

## 2020-08-13 DIAGNOSIS — I10 Essential (primary) hypertension: Secondary | ICD-10-CM

## 2020-08-13 DIAGNOSIS — N189 Chronic kidney disease, unspecified: Secondary | ICD-10-CM

## 2020-08-13 DIAGNOSIS — E785 Hyperlipidemia, unspecified: Secondary | ICD-10-CM

## 2020-08-13 DIAGNOSIS — E039 Hypothyroidism, unspecified: Secondary | ICD-10-CM

## 2020-08-13 MED ORDER — CARVEDILOL 12.5 MG PO TABS: 12.5000 mg | ORAL_TABLET | Freq: Two times a day (BID) | ORAL | 1 refills | Status: DC

## 2020-08-13 MED ORDER — LEVOTHYROXINE SODIUM 88 MCG PO TABS: ORAL_TABLET | ORAL | 1 refills | Status: DC

## 2020-08-13 MED ORDER — ALBUTEROL SULFATE HFA 108 (90 BASE) MCG/ACT IN AERS: 2.0000 | INHALATION_SPRAY | RESPIRATORY_TRACT | 1 refills | Status: DC | PRN

## 2020-08-13 MED ORDER — POTASSIUM CHLORIDE ER 10 MEQ PO TBCR: 20.0000 meq | EXTENDED_RELEASE_TABLET | Freq: Two times a day (BID) | ORAL | 3 refills | Status: DC

## 2020-08-13 MED ORDER — CHLORTHALIDONE 25 MG PO TABS: ORAL_TABLET | ORAL | 1 refills | Status: DC

## 2020-08-13 MED ORDER — FLUTICASONE-SALMETEROL 500-50 MCG/DOSE IN AEPB: 1.0000 | INHALATION_SPRAY | Freq: Two times a day (BID) | RESPIRATORY_TRACT | 3 refills | Status: DC

## 2020-08-13 MED ORDER — CLOPIDOGREL BISULFATE 75 MG PO TABS: 75.0000 mg | ORAL_TABLET | Freq: Every day | ORAL | 1 refills | Status: DC

## 2020-08-13 MED ORDER — OMEPRAZOLE 40 MG PO CPDR: 40.0000 mg | DELAYED_RELEASE_CAPSULE | Freq: Two times a day (BID) | ORAL | 2 refills | Status: DC

## 2020-08-13 MED ORDER — ROSUVASTATIN CALCIUM 20 MG PO TABS: 20.0000 mg | ORAL_TABLET | Freq: Every day | ORAL | 1 refills | Status: DC

## 2020-08-13 MED ORDER — ENALAPRIL MALEATE 10 MG PO TABS: 10.0000 mg | ORAL_TABLET | Freq: Two times a day (BID) | ORAL | 1 refills | Status: DC

## 2020-08-13 MED ORDER — AMLODIPINE BESYLATE 10 MG PO TABS: 10.0000 mg | ORAL_TABLET | Freq: Every day | ORAL | 1 refills | Status: DC

## 2020-08-13 MED ORDER — MONTELUKAST SODIUM 10 MG PO TABS: 10.0000 mg | ORAL_TABLET | Freq: Every day | ORAL | 1 refills | Status: DC

## 2020-08-13 NOTE — Progress Notes (Signed)
New patient visit  I,Sulibeya S Dimas,acting as a scribe for Lavon Paganini, MD.,have documented all relevant documentation on the behalf of Lavon Paganini, MD,as directed by  Lavon Paganini, MD while in the presence of Lavon Paganini, MD.   Patient: Andrea Hart   DOB: [birth year]   84 y.o. Female  MRN: [id number] Visit Date: 08/13/2020  Today's healthcare provider: Lavon Paganini, MD   Chief Complaint  Patient presents with  . Hyperlipidemia  . Hypertension  . Hypothyroidism  . New Patient (Initial Visit)  . Gait Problem   Subjective    Andrea Hart is a 84 y.o. female who presents today as a new patient to establish care. Patient transferring from [facility]. Patient being followed by Dr. Fletcher Anon, [facility] cardiologist for CAD. Chronic kidney disease stage 3 by Dr. Murlean Iba, nephrology.   Patient C/O unbalance gait x several months. Patient denies any dizziness, room spinning, or falling.  HPI  Hypertension, follow-up  BP Readings from Last 3 Encounters:  08/13/20 (!) 143/83  07/19/20 (!) 144/70  06/10/20 (!) 184/90   Wt Readings from Last 3 Encounters:  08/13/20 139 lb 9.6 oz (63.3 kg)  07/19/20 140 lb (63.5 kg)  06/10/20 140 lb 2 oz (63.6 kg)     She was last seen for hypertension 6 months ago.  BP at that visit was 168104. Management since that visit includes no changes.  She reports excellent compliance with treatment. She is not having side effects.  She is following a Regular diet. She is not exercising. She does not smoke.  Use of agents associated with hypertension: none.   Outside blood pressures are stable. Symptoms: No chest pain No chest pressure  No palpitations No syncope  No dyspnea No orthopnea  No paroxysmal nocturnal dyspnea No lower extremity edema   Pertinent labs: Lab Results  Component Value Date   CHOL 183 12/19/2019   HDL 59.60 12/19/2019   LDLCALC 87 12/19/2019   TRIG 182.0 (H) 12/19/2019    CHOLHDL 3 12/19/2019   Lab Results  Component Value Date   NA 139 02/05/2020   K 5.0 02/05/2020   CREATININE 1.30 (H) 02/05/2020   GFRNONAA 38 (L) 02/05/2020   GFRAA 45 (L) 02/05/2020   GLUCOSE 112 (H) 02/05/2020     The ASCVD Risk score Mikey Bussing DC Jr., et al., 2013) failed to calculate for the following reasons:   The 2013 ASCVD risk score is only valid for ages 50 to 65   ---------------------------------------------------------------------------------------------------   Past Medical History:  Diagnosis Date  . Arthritis   . Asthma   . Coronary artery disease    a. 2013 s/p PCI and stent placement in [facility] done for stable angina.  . Hyperlipidemia   . Hypertension   . Hypothyroidism   . Stroke [facility])    a. 2017 - ILR did not show afib.   Past Surgical History:  Procedure Laterality Date  . APPENDECTOMY    . CATARACT EXTRACTION Bilateral   . CHOLECYSTECTOMY    . CORONARY ANGIOPLASTY  2013   1xStent [facility].  Marland Kitchen EYE SURGERY    . HAND SURGERY Right   . LOOP RECORDER IMPLANT    . NISSEN FUNDOPLICATION  0737  . REPLACEMENT TOTAL KNEE Bilateral 2017  . TOTAL HIP ARTHROPLASTY Right   . TOTAL VAGINAL HYSTERECTOMY     Family Status  Relation Name Status  . Mother  Deceased  . Father  Deceased  . Brother  Deceased  . Neg Hx  (Not Specified)   Family History  Problem Relation Age of Onset  . Heart Problems Mother   . Heart attack Mother   . Heart Problems Father   . Heart attack Brother   . Heart Problems Brother   . Colon cancer Neg Hx   . Breast cancer Neg Hx    Social History   Socioeconomic History  . Marital status: Unknown    Spouse name: Not on file  . Number of children: 2  . Years of education: Not on file  . Highest education level: Not on file  Occupational History    Comment: retired Science writer  Tobacco Use  . Smoking status: Never Smoker  . Smokeless tobacco: Never Used  Vaping Use  . Vaping Use: Never used    Substance and Sexual Activity  . Alcohol use: Not Currently    Comment: Occasional  . Drug use: Never  . Sexual activity: Never    Birth control/protection: Surgical  Other Topics Concern  . Not on file  Social History Narrative   Moved here in 2019 , had been living in [place].    Born here, raised daughters here.    Lives with daughter now, [place].   Lost one daughter in car wreck.    Social Determinants of Health   Financial Resource Strain:   . Difficulty of Paying Living Expenses: Not on file  Food Insecurity:   . Worried About Charity fundraiser in the Last Year: Not on file  . Ran Out of Food in the Last Year: Not on file  Transportation Needs:   . Lack of Transportation (Medical): Not on file  . Lack of Transportation (Non-Medical): Not on file  Physical Activity:   . Days of Exercise per Week: Not on file  . Minutes of Exercise per Session: Not on file  Stress:   . Feeling of Stress : Not on file  Social Connections:   . Frequency of Communication with Friends and Family: Not on file  . Frequency of Social Gatherings with Friends and Family: Not on file  . Attends Religious Services: Not on file  . Active Member of Clubs or Organizations: Not on file  . Attends Archivist Meetings: Not on file  . Marital Status: Not on file   Outpatient Medications Prior to Visit  Medication Sig  . APPLE CIDER VINEGAR PO Take 2 tablets by mouth daily.  Marland Kitchen aspirin EC 81 MG tablet Take 81 mg by mouth daily.  . cetirizine (ZYRTEC) 10 MG tablet Take 1 tablet (10 mg total) by mouth daily.  Marland Kitchen FeFum-FePoly-FA-B Cmp-C-Biot (INTEGRA PLUS) CAPS Take 1 capsule by mouth daily.  . Multiple Vitamin (MULTIVITAMIN) capsule Take 1 capsule by mouth daily.  . Turmeric 500 MG TABS Take 1,000 mg by mouth daily.  Marland Kitchen VITAMIN D PO Take 2,000 Units by mouth daily.   Marland Kitchen zinc gluconate 50 MG tablet Take 50 mg by mouth daily.  . [DISCONTINUED] albuterol (VENTOLIN HFA) 108 (90 Base) MCG/ACT  inhaler Inhale 2 puffs into the lungs every 4 (four) hours as needed for wheezing or shortness of breath.  . [DISCONTINUED] amLODipine (NORVASC) 10 MG tablet Take 1 tablet (10 mg total) by mouth daily.  . [DISCONTINUED] carvedilol (COREG) 12.5 MG tablet Take 1 tablet (12.5 mg total) by mouth 2 (two) times daily.  . [DISCONTINUED] chlorthalidone (HYGROTON) 25 MG tablet Take 1 tablet (25 mg) by mouth once daily in the morning  . [  DISCONTINUED] clopidogrel (PLAVIX) 75 MG tablet Take 1 tablet (75 mg total) by mouth daily.  . [DISCONTINUED] enalapril (VASOTEC) 10 MG tablet Take 1 tablet (10 mg total) by mouth 2 (two) times daily.  . [DISCONTINUED] Fluticasone-Salmeterol (ADVAIR) 500-50 MCG/DOSE AEPB Inhale 1 puff into the lungs 2 (two) times daily.  . [DISCONTINUED] levothyroxine (SYNTHROID) 88 MCG tablet TAKE ONE TABLET BY MOUTH DAILY BEFORE BREAKFAST  . [DISCONTINUED] montelukast (SINGULAIR) 10 MG tablet Take 1 tablet (10 mg total) by mouth at bedtime.  . [DISCONTINUED] omeprazole (PRILOSEC) 40 MG capsule Take 1 capsule (40 mg total) by mouth 2 (two) times daily.  . [DISCONTINUED] potassium chloride (KLOR-CON) 10 MEQ tablet Take 2 tablets (20 mEq total) by mouth 2 (two) times daily.  . [DISCONTINUED] rosuvastatin (CRESTOR) 20 MG tablet Take 1 tablet (20 mg total) by mouth daily.  . [DISCONTINUED] diclofenac Sodium (VOLTAREN) 1 % GEL Apply 4 g topically 4 (four) times daily.  . [DISCONTINUED] lidocaine (LIDODERM) 5 % Place 1 patch onto the skin daily. Remove & Discard patch within 12 hours.   No facility-administered medications prior to visit.   Allergies  Allergen Reactions  . Lidocaine Rash     There is no immunization history on file for this patient.  Health Maintenance  Topic Date Due  . COVID-19 Vaccine (1) Never done  . TETANUS/TDAP  Never done  . DEXA SCAN  Never done  . PNA vac Low Risk Adult (1 of 2 - PCV13) Never done  . INFLUENZA VACCINE  07/04/2020    Patient Care  Team: Virginia Crews, MD as PCP - General (Family Medicine) Wellington Hampshire, MD as PCP - Cardiology (Cardiology)  Review of Systems  Constitutional: Negative.   HENT: Negative.   Eyes: Negative.   Respiratory: Negative.   Cardiovascular: Negative.   Endocrine: Negative.   Genitourinary: Negative.   Musculoskeletal: Positive for arthralgias.  Skin: Negative.   Allergic/Immunologic: Negative.   Neurological: Negative.   Hematological: Negative.   Psychiatric/Behavioral: Negative.     Last CBC Lab Results  Component Value Date   WBC 6.8 02/05/2020   HGB 11.0 (L) 02/05/2020   HCT 33.7 (L) 02/05/2020   MCV 93.4 02/05/2020   MCH 30.5 02/05/2020   RDW 12.8 02/05/2020   PLT 277 16/96/7893   Last metabolic panel Lab Results  Component Value Date   GLUCOSE 112 (H) 02/05/2020   NA 139 02/05/2020   K 5.0 02/05/2020   CL 108 02/05/2020   CO2 25 02/05/2020   BUN 22 02/05/2020   CREATININE 1.30 (H) 02/05/2020   GFRNONAA 38 (L) 02/05/2020   GFRAA 45 (L) 02/05/2020   CALCIUM 9.4 02/05/2020   PROT 7.3 12/19/2019   ALBUMIN 4.3 12/19/2019   BILITOT 0.4 12/19/2019   ALKPHOS 89 12/19/2019   AST 21 12/19/2019   ALT 15 12/19/2019   ANIONGAP 6 02/05/2020   Last lipids Lab Results  Component Value Date   CHOL 183 12/19/2019   HDL 59.60 12/19/2019   LDLCALC 87 12/19/2019   TRIG 182.0 (H) 12/19/2019   CHOLHDL 3 12/19/2019   Last hemoglobin A1c No results found for: HGBA1C Last thyroid functions Lab Results  Component Value Date   TSH 1.71 12/19/2019   T4TOTAL 10.5 10/28/2018   Last vitamin D No results found for: 25OHVITD2, 25OHVITD3, VD25OH Last vitamin B12 and Folate No results found for: VITAMINB12, FOLATE    Objective    BP (!) 143/83 (BP Location: Left Arm, Patient Position: Sitting, Cuff Size:  Normal)   Pulse (!) 59   Temp 97.8 F (36.6 C) (Oral)   Resp 16   Ht 4\' 11"  (1.499 m)   Wt 139 lb 9.6 oz (63.3 kg)   BMI 28.20 kg/m  Physical Exam Vitals  reviewed.  Constitutional:      General: She is not in acute distress.    Appearance: Normal appearance. She is well-developed and normal weight. She is not diaphoretic.  HENT:     Head: Normocephalic and atraumatic.  Eyes:     General: No scleral icterus.    Conjunctiva/sclera: Conjunctivae normal.  Neck:     Thyroid: No thyromegaly.  Cardiovascular:     Rate and Rhythm: Normal rate and regular rhythm.     Pulses: Normal pulses.     Heart sounds: Normal heart sounds. No murmur heard.   Pulmonary:     Effort: Pulmonary effort is normal. No respiratory distress.     Breath sounds: Normal breath sounds. No wheezing, rhonchi or rales.  Abdominal:     General: There is no distension.     Palpations: Abdomen is soft.     Tenderness: There is no abdominal tenderness.  Musculoskeletal:     Cervical back: Neck supple.     Right lower leg: No edema.     Left lower leg: No edema.  Lymphadenopathy:     Cervical: No cervical adenopathy.  Skin:    General: Skin is warm and dry.     Findings: No rash.  Neurological:     Mental Status: She is alert and oriented to person, place, and time. Mental status is at baseline.     Sensory: No sensory deficit.     Motor: No weakness.     Coordination: Coordination normal.     Gait: Gait abnormal (slow, wide based gait, unsteady appearing).  Psychiatric:        Mood and Affect: Mood normal.        Behavior: Behavior normal.     Depression Screen PHQ 2/9 Scores 08/13/2020 12/29/2019 04/29/2019 07/29/2018  PHQ - 2 Score 0 0 0 0  PHQ- 9 Score 0 0 0 -   No results found for any visits on 08/13/20.  Assessment & Plan      Problem List Items Addressed This Visit      Cardiovascular and Mediastinum   Essential hypertension - Primary    Also followed by cardiology Blood pressure slightly elevated today No change to medications currently Recheck metabolic panel At follow-up, will reassess blood pressure control      Relevant Medications    rosuvastatin (CRESTOR) 20 MG tablet   potassium chloride (KLOR-CON) 10 MEQ tablet   enalapril (VASOTEC) 10 MG tablet   clopidogrel (PLAVIX) 75 MG tablet   chlorthalidone (HYGROTON) 25 MG tablet   carvedilol (COREG) 12.5 MG tablet   amLODipine (NORVASC) 10 MG tablet   Other Relevant Orders   Comprehensive metabolic panel   CAD (coronary artery disease)    S/p stent placement Continue DAPT Followed by Cardiology      Relevant Medications   rosuvastatin (CRESTOR) 20 MG tablet   enalapril (VASOTEC) 10 MG tablet   chlorthalidone (HYGROTON) 25 MG tablet   carvedilol (COREG) 12.5 MG tablet   amLODipine (NORVASC) 10 MG tablet   Other Relevant Orders   Comprehensive metabolic panel   Lipid Panel With LDL/HDL Ratio     Respiratory   Mild intermittent asthma    Chronic and well-controlled Not needing rescue inhaler often  Will continue controller at current dose      Relevant Medications   montelukast (SINGULAIR) 10 MG tablet   Fluticasone-Salmeterol (ADVAIR) 500-50 MCG/DOSE AEPB   albuterol (VENTOLIN HFA) 108 (90 Base) MCG/ACT inhaler     Digestive   Gastroesophageal reflux disease without esophagitis    Chronic and stable chronic and stable Continue PPI      Relevant Medications   omeprazole (PRILOSEC) 40 MG capsule     Endocrine   Hypothyroidism    Previously well controlled Continue Synthroid at current dose  Recheck TSH and adjust Synthroid as indicated        Relevant Medications   levothyroxine (SYNTHROID) 88 MCG tablet   carvedilol (COREG) 12.5 MG tablet   Other Relevant Orders   TSH   VITAMIN D 25 Hydroxy (Vit-D Deficiency, Fractures)     Genitourinary   CKD (chronic kidney disease)    Followed followed by nephrology Continue ACE/ARB Avoid nephrotoxic medications Recheck metabolic panel      Relevant Orders   VITAMIN D 25 Hydroxy (Vit-D Deficiency, Fractures)     Other   Anemia    Chronic iron deficiency anemia On iron supplement Recheck CBC and  iron panel Denies any bleeding today, but may need GI work-up      Relevant Orders   CBC with Differential/Platelet   VITAMIN D 25 Hydroxy (Vit-D Deficiency, Fractures)   Hyperlipidemia    Continues continue statin  recheck FLP and CMP Goal LDL less than 70 Followed by cardiology       Relevant Medications   rosuvastatin (CRESTOR) 20 MG tablet   enalapril (VASOTEC) 10 MG tablet   chlorthalidone (HYGROTON) 25 MG tablet   carvedilol (COREG) 12.5 MG tablet   amLODipine (NORVASC) 10 MG tablet   Other Relevant Orders   Lipid Panel With LDL/HDL Ratio   H/O coronary angioplasty    As above, continue dual antiplatelet therapy Followed by cardiology      Relevant Medications   clopidogrel (PLAVIX) 75 MG tablet   Unsteady gait when walking    Reports worsening problem over the last few years She is status post bilateral knee replacement and hip replacement She has an unsteady slow and wide-based gait She does not seem to have any inner ear pathology, vision problems, neuropathy Suspect PT is the best option for her to improve her strength and give gait training We will order home health PT      Relevant Orders   Ambulatory referral to Home Health   Vitamin D deficiency    Continue supplement Recheck vitamin D level      Relevant Orders   VITAMIN D 25 Hydroxy (Vit-D Deficiency, Fractures)       Return in about 3 months (around 11/12/2020) for CPE, AWV.     I, Lavon Paganini, MD, have reviewed all documentation for this visit. The documentation on 08/16/20 for the exam, diagnosis, procedures, and orders are all accurate and complete.   Ramel Tobon, Dionne Bucy, MD, MPH [facility]

## 2020-08-13 NOTE — Patient Instructions (Signed)
Hypothyroidism  Hypothyroidism is when the thyroid gland does not make enough of certain hormones (it is underactive). The thyroid gland is a small gland located in the lower front part of the neck, just in front of the windpipe (trachea). This gland makes hormones that help control how the body uses food for energy (metabolism) as well as how the heart and brain function. These hormones also play a role in keeping your bones strong. When the thyroid is underactive, it produces too little of the hormones thyroxine (T4) and triiodothyronine (T3). What are the causes? This condition may be caused by:  Hashimoto's disease. This is a disease in which the body's disease-fighting system (immune system) attacks the thyroid gland. This is the most common cause.  Viral infections.  Pregnancy.  Certain medicines.  Birth defects.  Past radiation treatments to the head or neck for cancer.  Past treatment with radioactive iodine.  Past exposure to radiation in the environment.  Past surgical removal of part or all of the thyroid.  Problems with a gland in the center of the brain (pituitary gland).  Lack of enough iodine in the diet. What increases the risk? You are more likely to develop this condition if:  You are female.  You have a family history of thyroid conditions.  You use a medicine called lithium.  You take medicines that affect the immune system (immunosuppressants). What are the signs or symptoms? Symptoms of this condition include:  Feeling as though you have no energy (lethargy).  Not being able to tolerate cold.  Weight gain that is not explained by a change in diet or exercise habits.  Lack of appetite.  Dry skin.  Coarse hair.  Menstrual irregularity.  Slowing of thought processes.  Constipation.  Sadness or depression. How is this diagnosed? This condition may be diagnosed based on:  Your symptoms, your medical history, and a physical exam.  Blood  tests. You may also have imaging tests, such as an ultrasound or MRI. How is this treated? This condition is treated with medicine that replaces the thyroid hormones that your body does not make. After you begin treatment, it may take several weeks for symptoms to go away. Follow these instructions at home:  Take over-the-counter and prescription medicines only as told by your health care provider.  If you start taking any new medicines, tell your health care provider.  Keep all follow-up visits as told by your health care provider. This is important. ? As your condition improves, your dosage of thyroid hormone medicine may change. ? You will need to have blood tests regularly so that your health care provider can monitor your condition. Contact a health care provider if:  Your symptoms do not get better with treatment.  You are taking thyroid replacement medicine and you: ? Sweat a lot. ? Have tremors. ? Feel anxious. ? Lose weight rapidly. ? Cannot tolerate heat. ? Have emotional swings. ? Have diarrhea. ? Feel weak. Get help right away if you have:  Chest pain.  An irregular heartbeat.  A rapid heartbeat.  Difficulty breathing. Summary  Hypothyroidism is when the thyroid gland does not make enough of certain hormones (it is underactive).  When the thyroid is underactive, it produces too little of the hormones thyroxine (T4) and triiodothyronine (T3).  The most common cause is Hashimoto's disease, a disease in which the body's disease-fighting system (immune system) attacks the thyroid gland. The condition can also be caused by viral infections, medicine, pregnancy, or past  radiation treatment to the head or neck.  Symptoms may include weight gain, dry skin, constipation, feeling as though you do not have energy, and not being able to tolerate cold.  This condition is treated with medicine to replace the thyroid hormones that your body does not make. This information  is not intended to replace advice given to you by your health care provider. Make sure you discuss any questions you have with your health care provider. Document Revised: 11/02/2017 Document Reviewed: 10/31/2017 Elsevier Patient Education  [place]. High Cholesterol  High cholesterol is a condition in which the blood has high levels of a white, waxy, fat-like substance (cholesterol). The human body needs small amounts of cholesterol. The liver makes all the cholesterol that the body needs. Extra (excess) cholesterol comes from the food that we eat. Cholesterol is carried from the liver by the blood through the blood vessels. If you have high cholesterol, deposits (plaques) may build up on the walls of your blood vessels (arteries). Plaques make the arteries narrower and stiffer. Cholesterol plaques increase your risk for heart attack and stroke. Work with your health care provider to keep your cholesterol levels in a healthy range. What increases the risk? This condition is more likely to develop in people who:  Eat foods that are high in animal fat (saturated fat) or cholesterol.  Are overweight.  Are not getting enough exercise.  Have a family history of high cholesterol. What are the signs or symptoms? There are no symptoms of this condition. How is this diagnosed? This condition may be diagnosed from the results of a blood test.  If you are older than age 62, your health care provider may check your cholesterol every 4-6 years.  You may be checked more often if you already have high cholesterol or other risk factors for heart disease. The blood test for cholesterol measures:  "Bad" cholesterol (LDL cholesterol). This is the main type of cholesterol that causes heart disease. The desired level for LDL is less than 100.  "Good" cholesterol (HDL cholesterol). This type helps to protect against heart disease by cleaning the arteries and carrying the LDL away. The desired level  for HDL is 60 or higher.  Triglycerides. These are fats that the body can store or burn for energy. The desired number for triglycerides is lower than 150.  Total cholesterol. This is a measure of the total amount of cholesterol in your blood, including LDL cholesterol, HDL cholesterol, and triglycerides. A healthy number is less than 200. How is this treated? This condition is treated with diet changes, lifestyle changes, and medicines. Diet changes  This may include eating more whole grains, fruits, vegetables, nuts, and fish.  This may also include cutting back on red meat and foods that have a lot of added sugar. Lifestyle changes  Changes may include getting at least 40 minutes of aerobic exercise 3 times a week. Aerobic exercises include walking, biking, and swimming. Aerobic exercise along with a healthy diet can help you maintain a healthy weight.  Changes may also include quitting smoking. Medicines  Medicines are usually given if diet and lifestyle changes have failed to reduce your cholesterol to healthy levels.  Your health care provider may prescribe a statin medicine. Statin medicines have been shown to reduce cholesterol, which can reduce the risk of heart disease. Follow these instructions at home: Eating and drinking If told by your health care provider:  Eat chicken (without skin), fish, veal, shellfish, ground [place] breast,  and round or loin cuts of red meat.  Do not eat fried foods or fatty meats, such as hot dogs and salami.  Eat plenty of fruits, such as apples.  Eat plenty of vegetables, such as broccoli, potatoes, and carrots.  Eat beans, peas, and lentils.  Eat grains such as barley, rice, couscous, and bulgur wheat.  Eat pasta without cream sauces.  Use skim or nonfat milk, and eat low-fat or nonfat yogurt and cheeses.  Do not eat or drink whole milk, cream, ice cream, egg yolks, or hard cheeses.  Do not eat stick margarine or tub margarines that  contain trans fats (also called partially hydrogenated oils).  Do not eat saturated tropical oils, such as coconut oil and palm oil.  Do not eat cakes, cookies, crackers, or other baked goods that contain trans fats.  General instructions  Exercise as directed by your health care provider. Increase your activity level with activities such as gardening, walking, and taking the stairs.  Take over-the-counter and prescription medicines only as told by your health care provider.  Do not use any products that contain nicotine or tobacco, such as cigarettes and e-cigarettes. If you need help quitting, ask your health care provider.  Keep all follow-up visits as told by your health care provider. This is important. Contact a health care provider if:  You are struggling to maintain a healthy diet or weight.  You need help to start on an exercise program.  You need help to stop smoking. Get help right away if:  You have chest pain.  You have trouble breathing. This information is not intended to replace advice given to you by your health care provider. Make sure you discuss any questions you have with your health care provider. Document Revised: 11/23/2017 Document Reviewed: 05/20/2016 Elsevier Patient Education  [place]. Hypertension, Adult High blood pressure (hypertension) is when the force of blood pumping through the arteries is too strong. The arteries are the blood vessels that carry blood from the heart throughout the body. Hypertension forces the heart to work harder to pump blood and may cause arteries to become narrow or stiff. Untreated or uncontrolled hypertension can cause a heart attack, heart failure, a stroke, kidney disease, and other problems. A blood pressure reading consists of a higher number over a lower number. Ideally, your blood pressure should be below 120/80. The first ("top") number is called the systolic pressure. It is a measure of the pressure in your  arteries as your heart beats. The second ("bottom") number is called the diastolic pressure. It is a measure of the pressure in your arteries as the heart relaxes. What are the causes? The exact cause of this condition is not known. There are some conditions that result in or are related to high blood pressure. What increases the risk? Some risk factors for high blood pressure are under your control. The following factors may make you more likely to develop this condition:  Smoking.  Having type 2 diabetes mellitus, high cholesterol, or both.  Not getting enough exercise or physical activity.  Being overweight.  Having too much fat, sugar, calories, or salt (sodium) in your diet.  Drinking too much alcohol. Some risk factors for high blood pressure may be difficult or impossible to change. Some of these factors include:  Having chronic kidney disease.  Having a family history of high blood pressure.  Age. Risk increases with age.  Race. You may be at higher risk if you are African  American.  Gender. Men are at higher risk than women before age 41. After age 4, women are at higher risk than men.  Having obstructive sleep apnea.  Stress. What are the signs or symptoms? High blood pressure may not cause symptoms. Very high blood pressure (hypertensive crisis) may cause:  Headache.  Anxiety.  Shortness of breath.  Nosebleed.  Nausea and vomiting.  Vision changes.  Severe chest pain.  Seizures. How is this diagnosed? This condition is diagnosed by measuring your blood pressure while you are seated, with your arm resting on a flat surface, your legs uncrossed, and your feet flat on the floor. The cuff of the blood pressure monitor will be placed directly against the skin of your upper arm at the level of your heart. It should be measured at least twice using the same arm. Certain conditions can cause a difference in blood pressure between your right and left  arms. Certain factors can cause blood pressure readings to be lower or higher than normal for a short period of time:  When your blood pressure is higher when you are in a health care provider's office than when you are at home, this is called white coat hypertension. Most people with this condition do not need medicines.  When your blood pressure is higher at home than when you are in a health care provider's office, this is called masked hypertension. Most people with this condition may need medicines to control blood pressure. If you have a high blood pressure reading during one visit or you have normal blood pressure with other risk factors, you may be asked to:  Return on a different day to have your blood pressure checked again.  Monitor your blood pressure at home for 1 week or longer. If you are diagnosed with hypertension, you may have other blood or imaging tests to help your health care provider understand your overall risk for other conditions. How is this treated? This condition is treated by making healthy lifestyle changes, such as eating healthy foods, exercising more, and reducing your alcohol intake. Your health care provider may prescribe medicine if lifestyle changes are not enough to get your blood pressure under control, and if:  Your systolic blood pressure is above 130.  Your diastolic blood pressure is above 80. Your personal target blood pressure may vary depending on your medical conditions, your age, and other factors. Follow these instructions at home: Eating and drinking   Eat a diet that is high in fiber and potassium, and low in sodium, added sugar, and fat. An example eating plan is called the DASH (Dietary Approaches to Stop Hypertension) diet. To eat this way: ? Eat plenty of fresh fruits and vegetables. Try to fill one half of your plate at each meal with fruits and vegetables. ? Eat whole grains, such as whole-wheat pasta, brown rice, or whole-grain  bread. Fill about one fourth of your plate with whole grains. ? Eat or drink low-fat dairy products, such as skim milk or low-fat yogurt. ? Avoid fatty cuts of meat, processed or cured meats, and poultry with skin. Fill about one fourth of your plate with lean proteins, such as fish, chicken without skin, beans, eggs, or tofu. ? Avoid pre-made and processed foods. These tend to be higher in sodium, added sugar, and fat.  Reduce your daily sodium intake. Most people with hypertension should eat less than 1,500 mg of sodium a day.  Do not drink alcohol if: ? Your health care provider  tells you not to drink. ? You are pregnant, may be pregnant, or are planning to become pregnant.  If you drink alcohol: ? Limit how much you use to:  0-1 drink a day for women.  0-2 drinks a day for men. ? Be aware of how much alcohol is in your drink. In the [place] one drink equals one 12 oz bottle of beer (355 mL), one 5 oz glass of wine (148 mL), or one 1 oz glass of hard liquor (44 mL). Lifestyle   Work with your health care provider to maintain a healthy body weight or to lose weight. Ask what an ideal weight is for you.  Get at least 30 minutes of exercise most days of the week. Activities may include walking, swimming, or biking.  Include exercise to strengthen your muscles (resistance exercise), such as Pilates or lifting weights, as part of your weekly exercise routine. Try to do these types of exercises for 30 minutes at least 3 days a week.  Do not use any products that contain nicotine or tobacco, such as cigarettes, e-cigarettes, and chewing tobacco. If you need help quitting, ask your health care provider.  Monitor your blood pressure at home as told by your health care provider.  Keep all follow-up visits as told by your health care provider. This is important. Medicines  Take over-the-counter and prescription medicines only as told by your health care provider. Follow directions carefully.  Blood pressure medicines must be taken as prescribed.  Do not skip doses of blood pressure medicine. Doing this puts you at risk for problems and can make the medicine less effective.  Ask your health care provider about side effects or reactions to medicines that you should watch for. Contact a health care provider if you:  Think you are having a reaction to a medicine you are taking.  Have headaches that keep coming back (recurring).  Feel dizzy.  Have swelling in your ankles.  Have trouble with your vision. Get help right away if you:  Develop a severe headache or confusion.  Have unusual weakness or numbness.  Feel faint.  Have severe pain in your chest or abdomen.  Vomit repeatedly.  Have trouble breathing. Summary  Hypertension is when the force of blood pumping through your arteries is too strong. If this condition is not controlled, it may put you at risk for serious complications.  Your personal target blood pressure may vary depending on your medical conditions, your age, and other factors. For most people, a normal blood pressure is less than 120/80.  Hypertension is treated with lifestyle changes, medicines, or a combination of both. Lifestyle changes include losing weight, eating a healthy, low-sodium diet, exercising more, and limiting alcohol. This information is not intended to replace advice given to you by your health care provider. Make sure you discuss any questions you have with your health care provider. Document Revised: 07/31/2018 Document Reviewed: 07/31/2018 Elsevier Patient Education  2020 [employer].

## 2020-08-16 DIAGNOSIS — M1990 Unspecified osteoarthritis, unspecified site: Secondary | ICD-10-CM | POA: Diagnosis not present

## 2020-08-16 DIAGNOSIS — E785 Hyperlipidemia, unspecified: Secondary | ICD-10-CM | POA: Diagnosis not present

## 2020-08-16 DIAGNOSIS — I2510 Atherosclerotic heart disease of native coronary artery without angina pectoris: Secondary | ICD-10-CM | POA: Diagnosis not present

## 2020-08-16 DIAGNOSIS — I10 Essential (primary) hypertension: Secondary | ICD-10-CM | POA: Diagnosis not present

## 2020-08-16 DIAGNOSIS — D649 Anemia, unspecified: Secondary | ICD-10-CM | POA: Diagnosis not present

## 2020-08-16 DIAGNOSIS — E559 Vitamin D deficiency, unspecified: Secondary | ICD-10-CM | POA: Diagnosis not present

## 2020-08-16 DIAGNOSIS — N189 Chronic kidney disease, unspecified: Secondary | ICD-10-CM | POA: Diagnosis not present

## 2020-08-16 DIAGNOSIS — E039 Hypothyroidism, unspecified: Secondary | ICD-10-CM | POA: Diagnosis not present

## 2020-08-16 NOTE — Assessment & Plan Note (Signed)
Reports worsening problem over the last few years She is status post bilateral knee replacement and hip replacement She has an unsteady slow and wide-based gait She does not seem to have any inner ear pathology, vision problems, neuropathy Suspect PT is the best option for her to improve her strength and give gait training We will order home health PT

## 2020-08-16 NOTE — Assessment & Plan Note (Signed)
Continues continue statin  recheck FLP and CMP Goal LDL less than 70 Followed by cardiology

## 2020-08-16 NOTE — Assessment & Plan Note (Signed)
Previously well controlled Continue Synthroid at current dose  Recheck TSH and adjust Synthroid as indicated   

## 2020-08-16 NOTE — Assessment & Plan Note (Signed)
Chronic and stable chronic and stable Continue PPI

## 2020-08-16 NOTE — Assessment & Plan Note (Signed)
Also followed by cardiology Blood pressure slightly elevated today No change to medications currently Recheck metabolic panel At follow-up, will reassess blood pressure control

## 2020-08-16 NOTE — Assessment & Plan Note (Signed)
S/p stent placement Continue DAPT Followed by Cardiology

## 2020-08-16 NOTE — Assessment & Plan Note (Signed)
Chronic and well-controlled Not needing rescue inhaler often Will continue controller at current dose

## 2020-08-16 NOTE — Assessment & Plan Note (Signed)
Continue supplement Recheck vitamin D level 

## 2020-08-16 NOTE — Assessment & Plan Note (Signed)
Chronic iron deficiency anemia On iron supplement Recheck CBC and iron panel Denies any bleeding today, but may need GI work-up

## 2020-08-16 NOTE — Assessment & Plan Note (Signed)
Followed followed by nephrology Continue ACE/ARB Avoid nephrotoxic medications Recheck metabolic panel

## 2020-08-16 NOTE — Assessment & Plan Note (Signed)
As above, continue dual antiplatelet therapy Followed by cardiology

## 2020-08-17 ENCOUNTER — Telehealth: Payer: Self-pay

## 2020-08-17 LAB — TSH: TSH: 0.546 u[IU]/mL (ref 0.450–4.500)

## 2020-08-17 LAB — CBC WITH DIFFERENTIAL/PLATELET
Basophils Absolute: 0.1 10*3/uL (ref 0.0–0.2)
Basos: 1 %
EOS (ABSOLUTE): 0.5 10*3/uL — ABNORMAL HIGH (ref 0.0–0.4)
Eos: 7 %
Hematocrit: 30.2 % — ABNORMAL LOW (ref 34.0–46.6)
Hemoglobin: 10.2 g/dL — ABNORMAL LOW (ref 11.1–15.9)
Immature Grans (Abs): 0 10*3/uL (ref 0.0–0.1)
Immature Granulocytes: 0 %
Lymphocytes Absolute: 1 10*3/uL (ref 0.7–3.1)
Lymphs: 15 %
MCH: 30.9 pg (ref 26.6–33.0)
MCHC: 33.8 g/dL (ref 31.5–35.7)
MCV: 92 fL (ref 79–97)
Monocytes Absolute: 0.8 10*3/uL (ref 0.1–0.9)
Monocytes: 11 %
Neutrophils Absolute: 4.6 10*3/uL (ref 1.4–7.0)
Neutrophils: 66 %
Platelets: 245 10*3/uL (ref 150–450)
RBC: 3.3 x10E6/uL — ABNORMAL LOW (ref 3.77–5.28)
RDW: 12.3 % (ref 11.7–15.4)
WBC: 7 10*3/uL (ref 3.4–10.8)

## 2020-08-17 LAB — COMPREHENSIVE METABOLIC PANEL
ALT: 18 IU/L (ref 0–32)
AST: 23 IU/L (ref 0–40)
Albumin/Globulin Ratio: 1.9 (ref 1.2–2.2)
Albumin: 4.2 g/dL (ref 3.6–4.6)
Alkaline Phosphatase: 78 IU/L (ref 44–121)
BUN/Creatinine Ratio: 15 (ref 12–28)
BUN: 17 mg/dL (ref 8–27)
Bilirubin Total: 0.3 mg/dL (ref 0.0–1.2)
CO2: 19 mmol/L — ABNORMAL LOW (ref 20–29)
Calcium: 9.5 mg/dL (ref 8.7–10.3)
Chloride: 104 mmol/L (ref 96–106)
Creatinine, Ser: 1.12 mg/dL — ABNORMAL HIGH (ref 0.57–1.00)
GFR calc Af Amer: 53 mL/min/{1.73_m2} — ABNORMAL LOW (ref >59)
GFR calc non Af Amer: 46 mL/min/{1.73_m2} — ABNORMAL LOW (ref >59)
Globulin, Total: 2.2 g/dL (ref 1.5–4.5)
Glucose: 114 mg/dL — ABNORMAL HIGH (ref 65–99)
Potassium: 4.4 mmol/L (ref 3.5–5.2)
Sodium: 138 mmol/L (ref 134–144)
Total Protein: 6.4 g/dL (ref 6.0–8.5)

## 2020-08-17 LAB — LIPID PANEL WITH LDL/HDL RATIO
Cholesterol, Total: 145 mg/dL (ref 100–199)
HDL: 60 mg/dL (ref >39)
LDL Chol Calc (NIH): 65 mg/dL (ref 0–99)
LDL/HDL Ratio: 1.1 ratio (ref 0.0–3.2)
Triglycerides: 111 mg/dL (ref 0–149)
VLDL Cholesterol Cal: 20 mg/dL (ref 5–40)

## 2020-08-17 LAB — VITAMIN D 25 HYDROXY (VIT D DEFICIENCY, FRACTURES): Vit D, 25-Hydroxy: 52.9 ng/mL (ref 30.0–100.0)

## 2020-08-17 NOTE — Telephone Encounter (Signed)
Patient and daughter advised as below. Patient denies any bleeding. Patient has follow up with nephrology next month. Add on lab faxed to [facility].

## 2020-08-17 NOTE — Telephone Encounter (Signed)
-----   Message from Virginia Crews, MD sent at 08/17/2020  8:17 AM EDT ----- Normal labs, except for slight worsening of anemia.  Should discuss with Nephrology whether this could be anemia of chronic kidney disease.  Ensure no active bleeding.  Please add on iron panel with the lab, CMAs##

## 2020-08-17 NOTE — Telephone Encounter (Signed)
Copied from [place] [id number]. Topic: General - Other >> Aug 16, 2020  2:23 PM Oneta Rack wrote: Osvaldo Human name: Jeani Hawking  Relation to pt: PT from [facility]  Call back number: [phone] option 2   Reason for call: requesting PT orders for  1x 1 2x 5 1x 3 and OT evaluation

## 2020-08-17 NOTE — Telephone Encounter (Signed)
Ok for verbals 

## 2020-08-17 NOTE — Telephone Encounter (Signed)
Left message advising Andrea Hart with [facility] of the verbal orders.   Thanks,   -Mickel Baas

## 2020-08-18 LAB — IRON: Iron: 56 ug/dL (ref 27–139)

## 2020-08-18 LAB — SPECIMEN STATUS REPORT

## 2020-08-19 ENCOUNTER — Telehealth: Payer: Self-pay

## 2020-08-19 DIAGNOSIS — R2681 Unsteadiness on feet: Secondary | ICD-10-CM

## 2020-08-19 NOTE — Telephone Encounter (Signed)
Copied from [place] [id number]. Topic: General - Inquiry >> Aug 19, 2020  3:57 PM Greggory Keen D wrote: Reason for CRM: Pt called saying she went to the PT as Dr. B requested and they think her walking issue is not with with balance due to walking abilities.  They think it's "inner ear/head" type of problem.  She wants to be referred to some one for balance.  She also stated that the office called to add on more lab work from her visit with Dr. B and she has not heard anything back about the lab results  CB#  [phone]

## 2020-08-20 NOTE — Telephone Encounter (Signed)
Patient did not want therapy until after seeing specialist.  Referral to ENT placed.

## 2020-08-20 NOTE — Telephone Encounter (Signed)
Can change PT referral to be for vestibular PT or call [facility] and change.  See result note

## 2020-09-06 ENCOUNTER — Other Ambulatory Visit: Payer: Self-pay | Admitting: Family

## 2020-09-06 DIAGNOSIS — D649 Anemia, unspecified: Secondary | ICD-10-CM

## 2020-09-10 DIAGNOSIS — H903 Sensorineural hearing loss, bilateral: Secondary | ICD-10-CM | POA: Diagnosis not present

## 2020-09-10 DIAGNOSIS — R260 Ataxic gait: Secondary | ICD-10-CM | POA: Diagnosis not present

## 2020-09-16 DIAGNOSIS — M19012 Primary osteoarthritis, left shoulder: Secondary | ICD-10-CM | POA: Diagnosis not present

## 2020-10-04 ENCOUNTER — Ambulatory Visit: Payer: Medicare Other | Admitting: Family

## 2020-10-05 ENCOUNTER — Ambulatory Visit: Payer: Medicare Other | Admitting: Family

## 2020-10-19 DIAGNOSIS — N1832 Chronic kidney disease, stage 3b: Secondary | ICD-10-CM | POA: Diagnosis not present

## 2020-10-19 DIAGNOSIS — R809 Proteinuria, unspecified: Secondary | ICD-10-CM | POA: Diagnosis not present

## 2020-10-19 DIAGNOSIS — I10 Essential (primary) hypertension: Secondary | ICD-10-CM | POA: Diagnosis not present

## 2020-10-26 ENCOUNTER — Other Ambulatory Visit: Payer: Self-pay | Admitting: Family Medicine

## 2020-10-26 DIAGNOSIS — E039 Hypothyroidism, unspecified: Secondary | ICD-10-CM

## 2020-10-26 DIAGNOSIS — J4520 Mild intermittent asthma, uncomplicated: Secondary | ICD-10-CM

## 2020-11-05 DIAGNOSIS — E538 Deficiency of other specified B group vitamins: Secondary | ICD-10-CM | POA: Diagnosis not present

## 2020-11-05 DIAGNOSIS — E531 Pyridoxine deficiency: Secondary | ICD-10-CM | POA: Diagnosis not present

## 2020-11-05 DIAGNOSIS — R7309 Other abnormal glucose: Secondary | ICD-10-CM | POA: Diagnosis not present

## 2020-11-05 DIAGNOSIS — R0989 Other specified symptoms and signs involving the circulatory and respiratory systems: Secondary | ICD-10-CM | POA: Diagnosis not present

## 2020-11-05 DIAGNOSIS — R2681 Unsteadiness on feet: Secondary | ICD-10-CM | POA: Diagnosis not present

## 2020-11-05 DIAGNOSIS — E519 Thiamine deficiency, unspecified: Secondary | ICD-10-CM | POA: Diagnosis not present

## 2020-11-05 DIAGNOSIS — R202 Paresthesia of skin: Secondary | ICD-10-CM | POA: Diagnosis not present

## 2020-11-05 DIAGNOSIS — R200 Anesthesia of skin: Secondary | ICD-10-CM | POA: Diagnosis not present

## 2020-11-05 DIAGNOSIS — Z8673 Personal history of transient ischemic attack (TIA), and cerebral infarction without residual deficits: Secondary | ICD-10-CM | POA: Diagnosis not present

## 2020-11-05 DIAGNOSIS — E559 Vitamin D deficiency, unspecified: Secondary | ICD-10-CM | POA: Diagnosis not present

## 2020-11-08 ENCOUNTER — Other Ambulatory Visit: Payer: Self-pay | Admitting: Neurology

## 2020-11-08 DIAGNOSIS — R0989 Other specified symptoms and signs involving the circulatory and respiratory systems: Secondary | ICD-10-CM

## 2020-11-08 DIAGNOSIS — R2681 Unsteadiness on feet: Secondary | ICD-10-CM

## 2020-11-19 ENCOUNTER — Ambulatory Visit
Admission: RE | Admit: 2020-11-19 | Discharge: 2020-11-19 | Disposition: A | Discharge status: Home / Self Care | Payer: Medicare Other | Source: Ambulatory Visit | Attending: Neurology | Admitting: Neurology

## 2020-11-19 ENCOUNTER — Other Ambulatory Visit: Payer: Self-pay

## 2020-11-19 DIAGNOSIS — R2681 Unsteadiness on feet: Secondary | ICD-10-CM

## 2020-11-19 DIAGNOSIS — I6782 Cerebral ischemia: Secondary | ICD-10-CM | POA: Diagnosis not present

## 2020-11-19 DIAGNOSIS — M47812 Spondylosis without myelopathy or radiculopathy, cervical region: Secondary | ICD-10-CM | POA: Diagnosis not present

## 2020-11-19 DIAGNOSIS — I771 Stricture of artery: Secondary | ICD-10-CM | POA: Diagnosis not present

## 2020-11-19 DIAGNOSIS — M5021 Other cervical disc displacement,  high cervical region: Secondary | ICD-10-CM | POA: Diagnosis not present

## 2020-11-19 DIAGNOSIS — R2689 Other abnormalities of gait and mobility: Secondary | ICD-10-CM | POA: Diagnosis not present

## 2020-11-19 DIAGNOSIS — M50223 Other cervical disc displacement at C6-C7 level: Secondary | ICD-10-CM | POA: Diagnosis not present

## 2020-11-19 DIAGNOSIS — R0989 Other specified symptoms and signs involving the circulatory and respiratory systems: Secondary | ICD-10-CM | POA: Insufficient documentation

## 2020-11-19 DIAGNOSIS — I6523 Occlusion and stenosis of bilateral carotid arteries: Secondary | ICD-10-CM | POA: Diagnosis not present

## 2020-11-19 DIAGNOSIS — E782 Mixed hyperlipidemia: Secondary | ICD-10-CM | POA: Diagnosis not present

## 2020-11-19 DIAGNOSIS — I10 Essential (primary) hypertension: Secondary | ICD-10-CM | POA: Diagnosis not present

## 2020-11-19 DIAGNOSIS — J3489 Other specified disorders of nose and nasal sinuses: Secondary | ICD-10-CM | POA: Diagnosis not present

## 2020-11-19 DIAGNOSIS — J320 Chronic maxillary sinusitis: Secondary | ICD-10-CM | POA: Diagnosis not present

## 2020-11-19 DIAGNOSIS — M4319 Spondylolisthesis, multiple sites in spine: Secondary | ICD-10-CM | POA: Diagnosis not present

## 2020-11-19 IMAGING — MR MR HEAD WO/W CM
8 of 10 series · 33 of 48 positions shown · IV contrast (gadavist)
Comparison: None.

CLINICAL DATA: Balance disturbance.

EXAM:
MRI HEAD WITHOUT AND WITH CONTRAST
TECHNIQUE: Multiplanar, multiecho pulse sequences of the brain and surrounding
structures were obtained without and with intravenous contrast.
CONTRAST:  6mL GADAVIST GADOBUTROL 1 MMOL/ML IV SOLN

[Series 5: ax dwi_tracew · axial · 3.0mm · 0.60mm/px · z∈[-122,-14]mm · 6 of 48 slices shown]
[im 1/48]
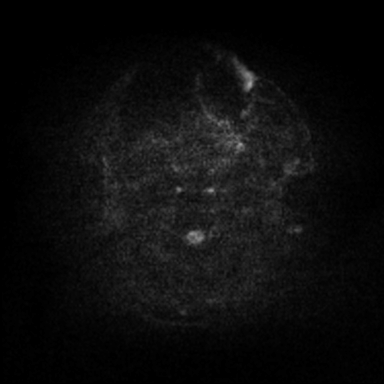
[im 7/48]
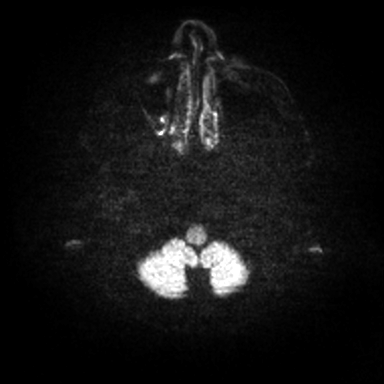
[im 14/48]
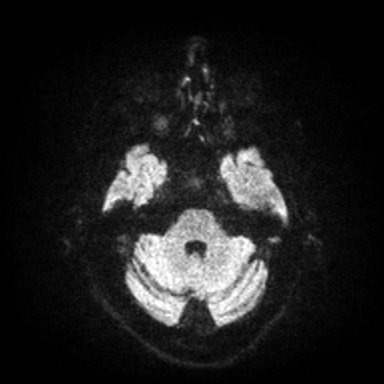
[im 21/48]
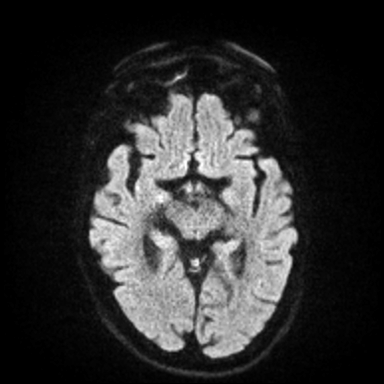
[im 27/48]
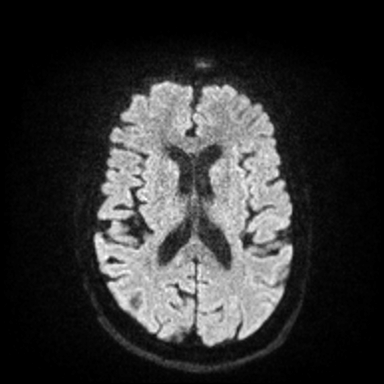
[im 34/48]
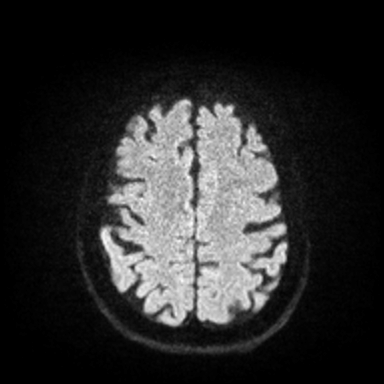

[Series 11: T2 · sagittal · 3.0mm · 0.62mm/px · 3 of 15 slices shown (1 of 2)]
[im 1/15]
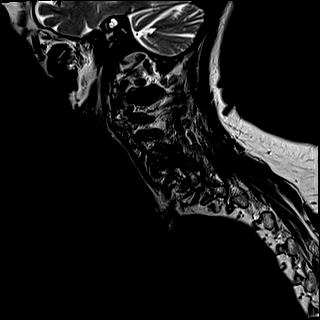
[im 8/15]
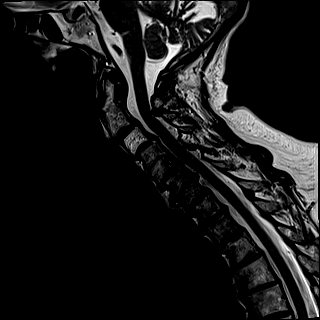
[im 15/15]
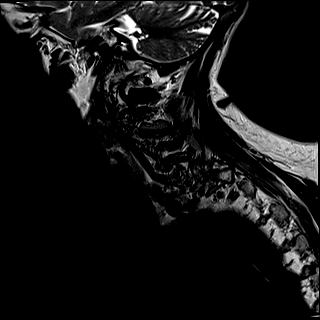

[Series 12: FLAIR · sagittal · 3.0mm · 0.78mm/px · 3 of 15 slices shown]
[im 1/15]
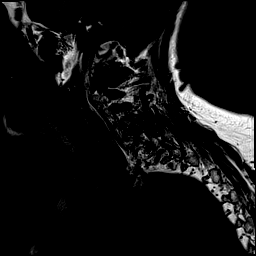
[im 8/15]
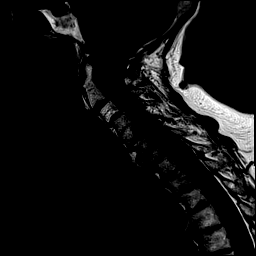
[im 15/15]
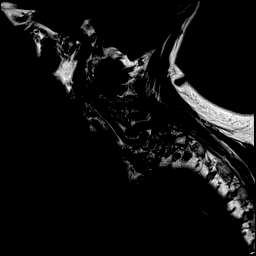

[Series 13: STIR · sagittal · 3.0mm · 0.62mm/px · 3 of 15 slices shown]
[im 1/15]
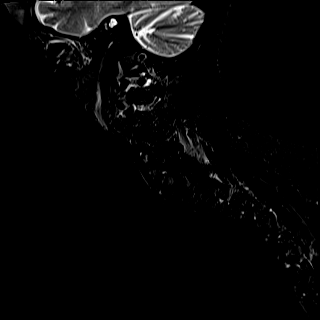
[im 8/15]
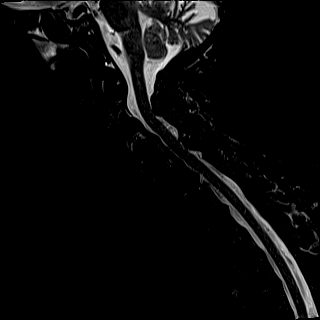
[im 15/15]
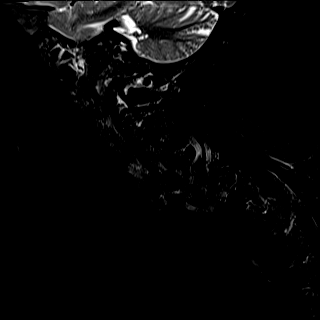

[Series 14: T2 · axial · 3.0mm · 0.70mm/px · z∈[-232,-161]mm · 5 of 25 slices shown (2 of 2)]
[im 1/25]
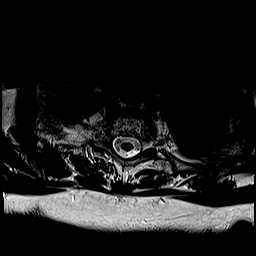
[im 7/25]
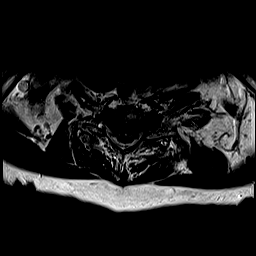
[im 13/25]
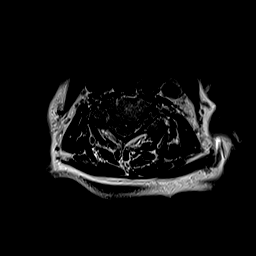
[im 19/25]
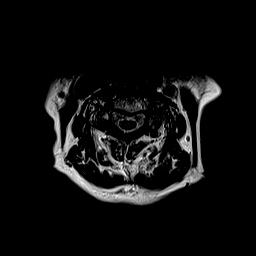
[im 25/25]
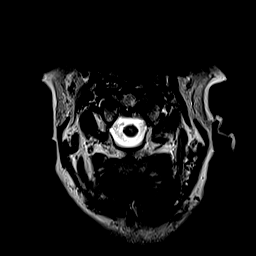

[Series 16: T1 · axial · non-contrast · 3.0mm · 0.35mm/px · z∈[-232,-161]mm · 5 of 25 slices shown]
[im 1/25]
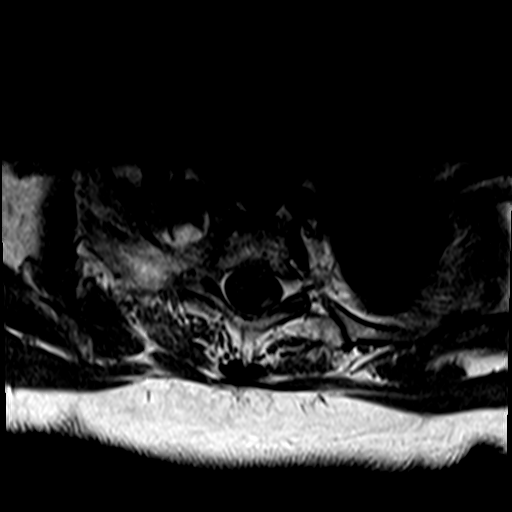
[im 7/25]
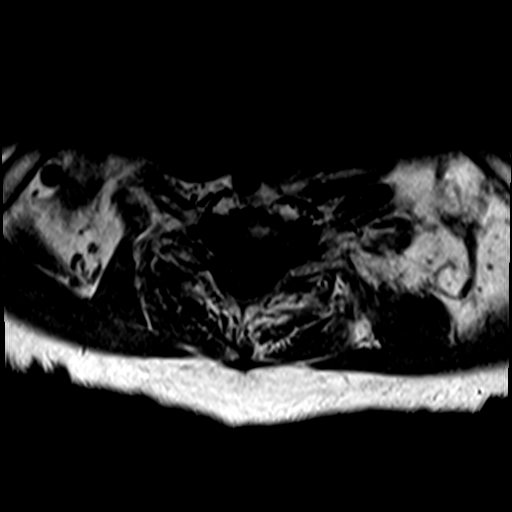
[im 13/25]
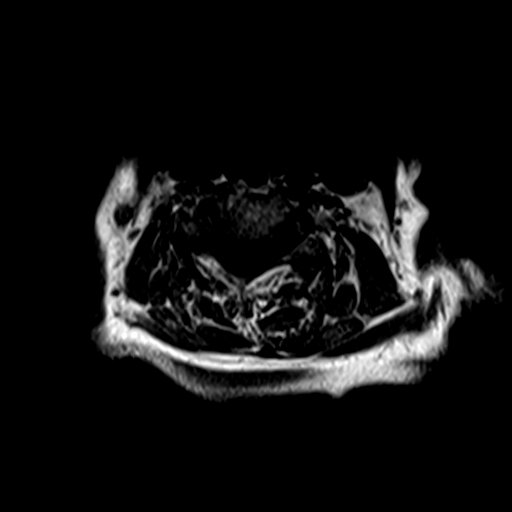
[im 19/25]
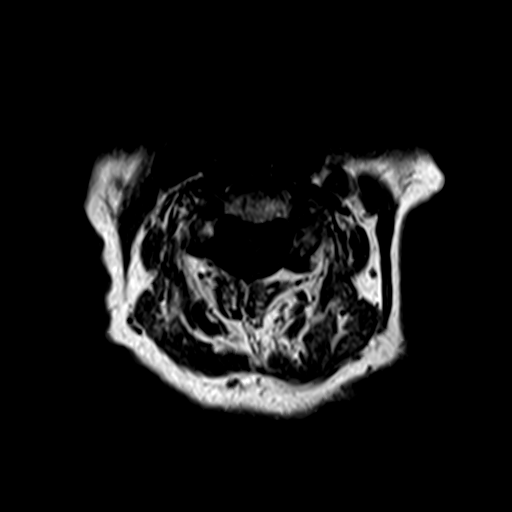
[im 25/25]
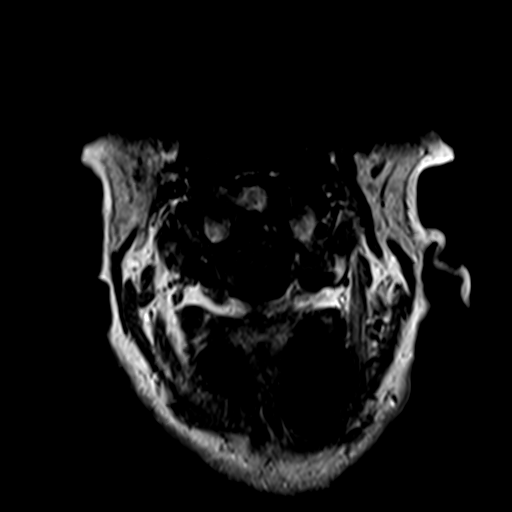

[Series 17: FLAIR fat-sat post-contrast · sagittal · 3.0mm · 0.78mm/px · 3 of 15 slices shown]
[im 1/15]
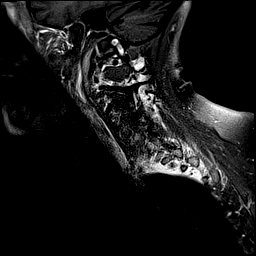
[im 8/15]
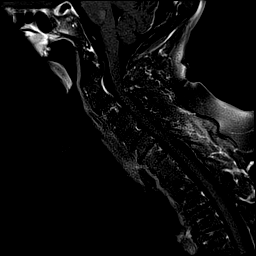
[im 15/15]
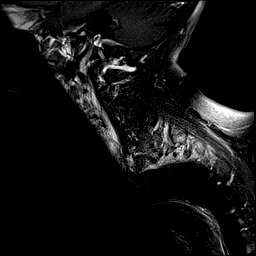

[Series 18: T1 post-contrast · axial · 3.0mm · 0.35mm/px · z∈[-232,-161]mm · 5 of 25 slices shown]
[im 1/25]
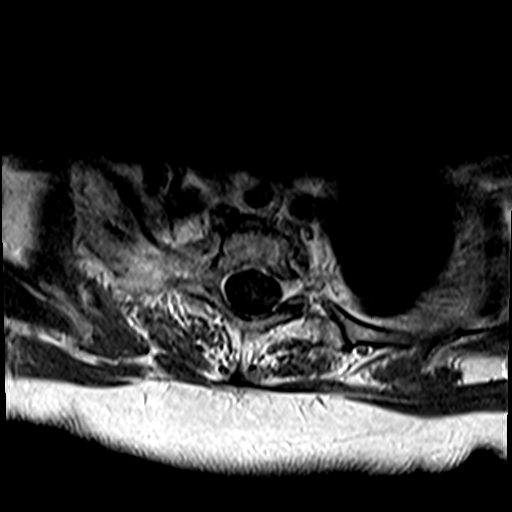
[im 7/25]
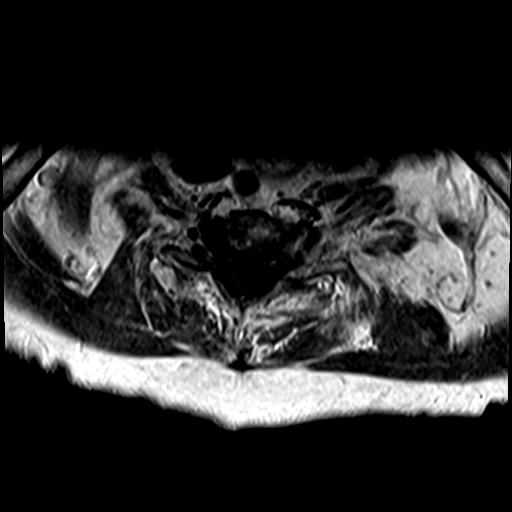
[im 13/25]
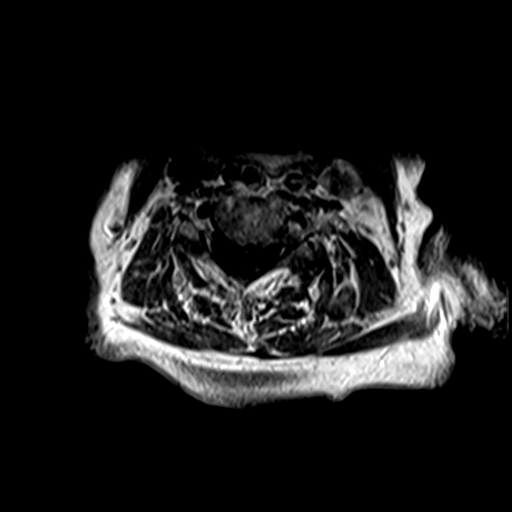
[im 19/25]
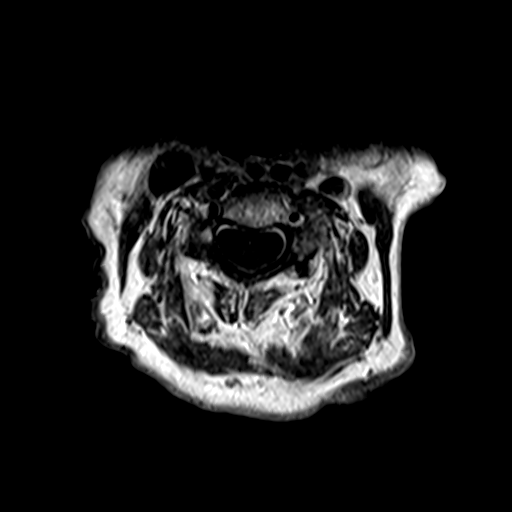
[im 25/25]
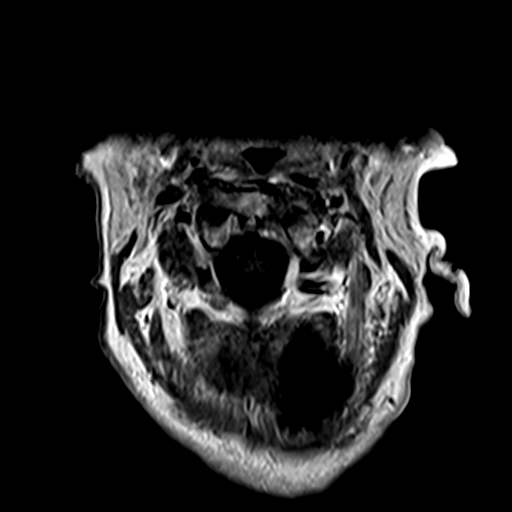

[33 of 48 positions shown; findings below may reference images not displayed]

FINDINGS: Brain: Diffusion imaging does not show any acute or subacute
infarction. Mild chronic small-vessel change affects the dorsal
pons. No focal cerebellar finding. Old small vessel infarction of
the left thalamus. Moderate chronic small-vessel ischemic changes of
the hemispheric deep and subcortical white matter. No cortical or
large vessel territory infarction. No mass lesion, hemorrhage,
hydrocephalus or extra-axial collection. After contrast
administration, no abnormal enhancement occurs.

Vascular: Major vessels at the base of the brain show flow.

Skull and upper cervical spine: Negative

Sinuses/Orbits: Clear except for mild mucosal thickening of the
maxillary sinuses and right frontal sinus. Orbits negative.

Other: No fluid in the middle ears or mastoids.
IMPRESSION: No acute or reversible finding. Moderate chronic small-vessel
ischemic changes throughout the brain as outlined above.

## 2020-11-19 IMAGING — US US CAROTID DUPLEX BILAT
1 series · 13 of 24 positions shown · non-contrast
Comparison: None.

CLINICAL DATA: Carotid bruit, bilateral. Hyperlipidemia,
hypertension, coronary artery disease.

EXAM:
BILATERAL CAROTID DUPLEX ULTRASOUND
TECHNIQUE: Gray scale imaging, color Doppler and duplex ultrasound were
performed of bilateral carotid and vertebral arteries in the neck.

[Series 1: us carotid bilateral · 13 of 62 slices shown]
[im 1/62]
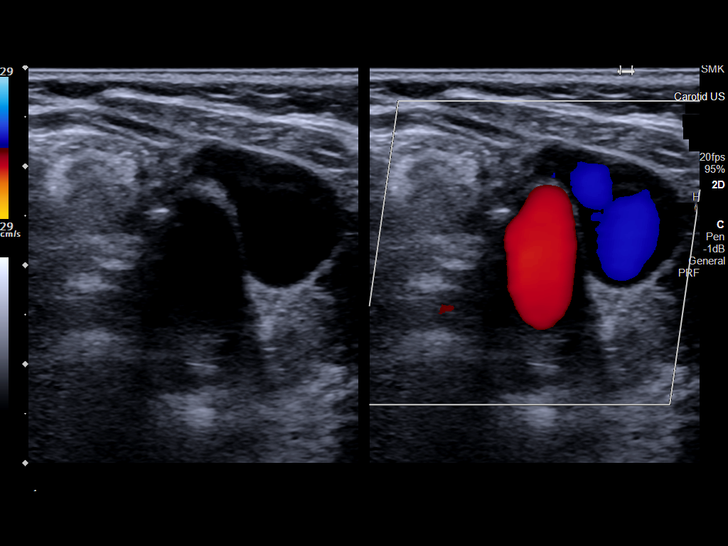
[im 6/62]
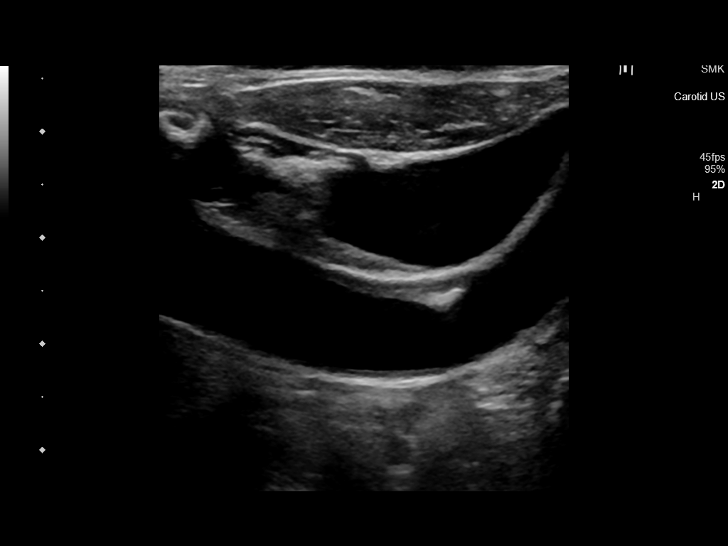
[im 11/62]
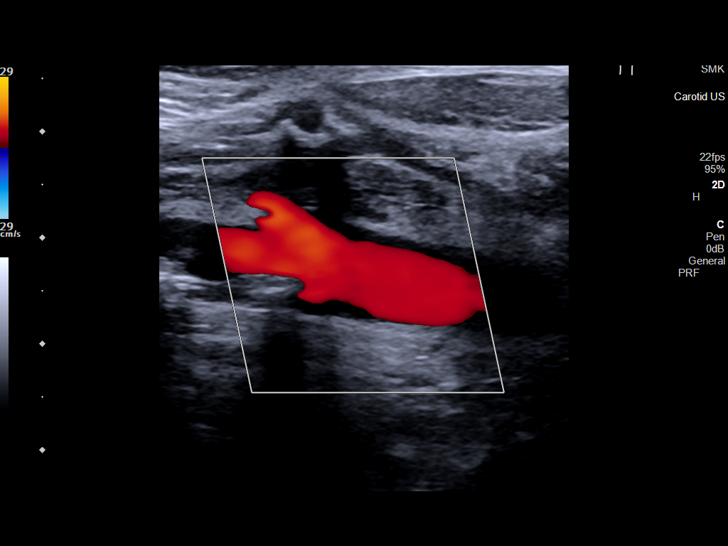
[im 16/62]
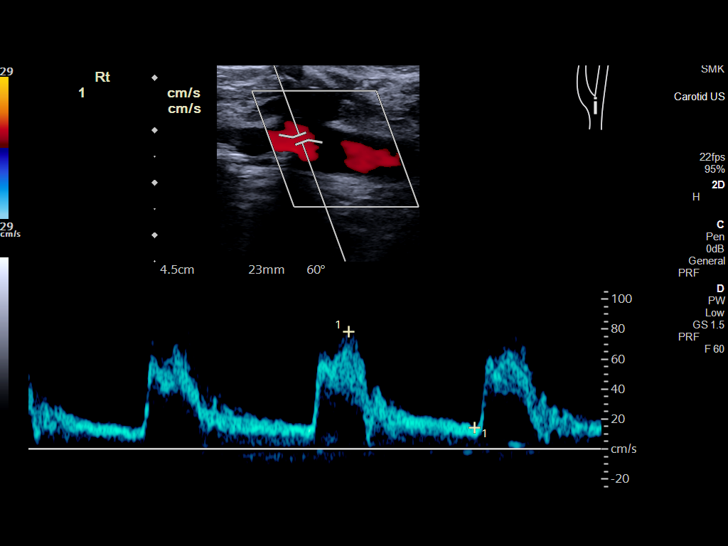
[im 22/62]
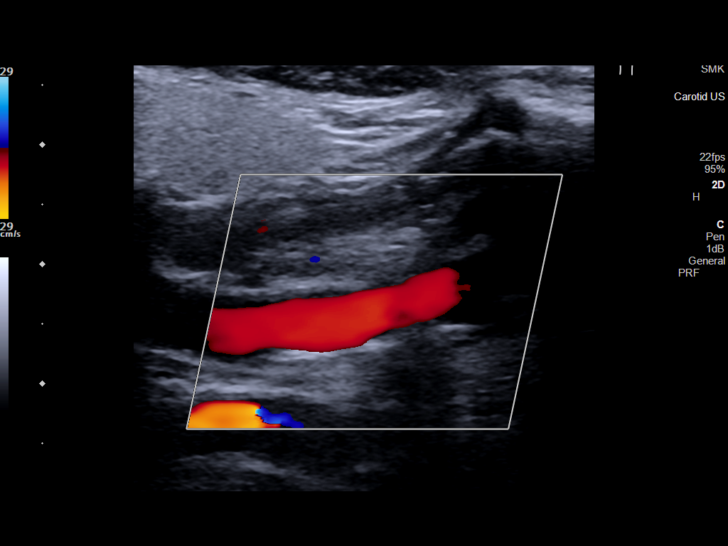
[im 27/62]
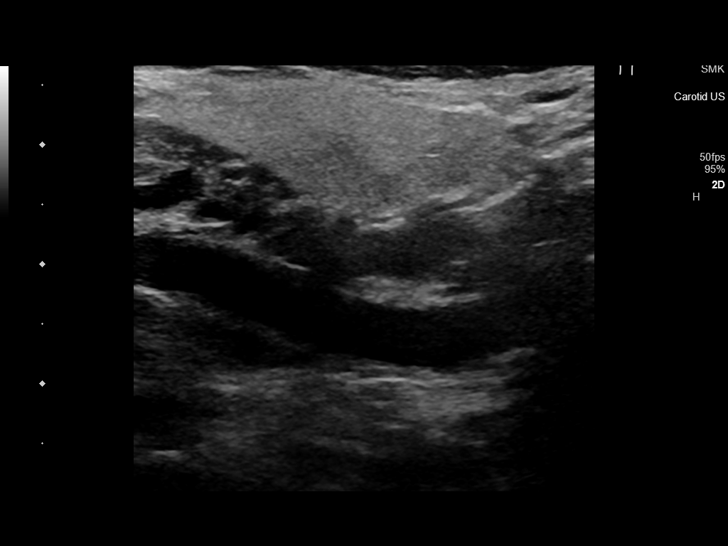
[im 32/62]
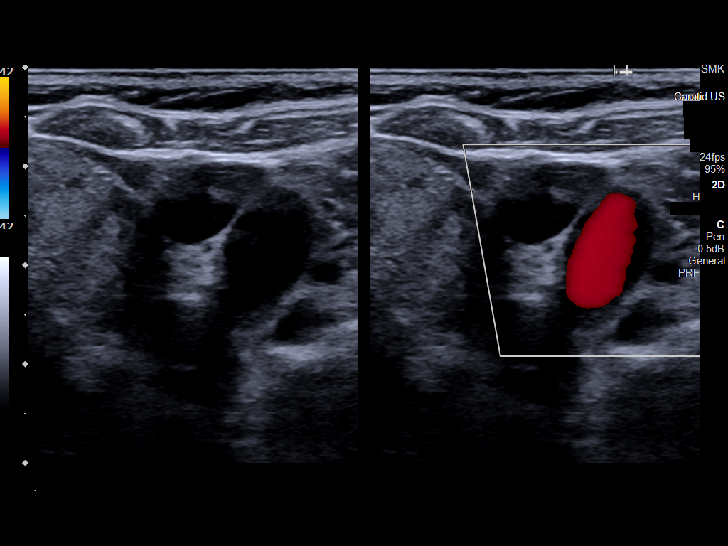
[im 35/62]
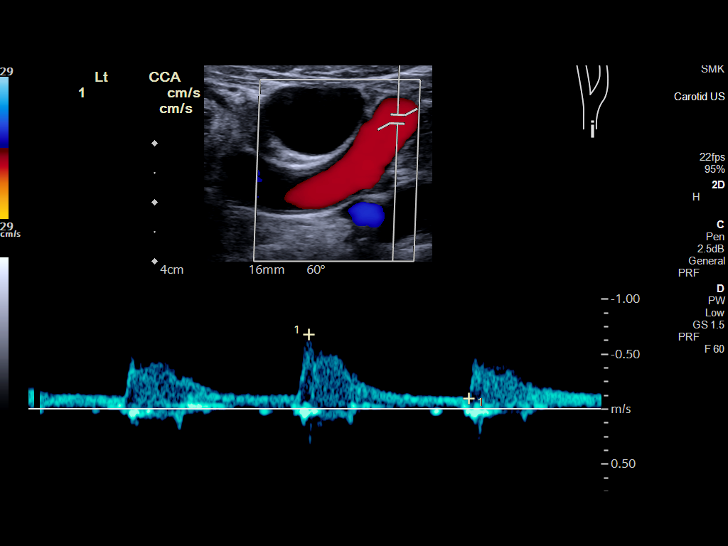
[im 40/62]
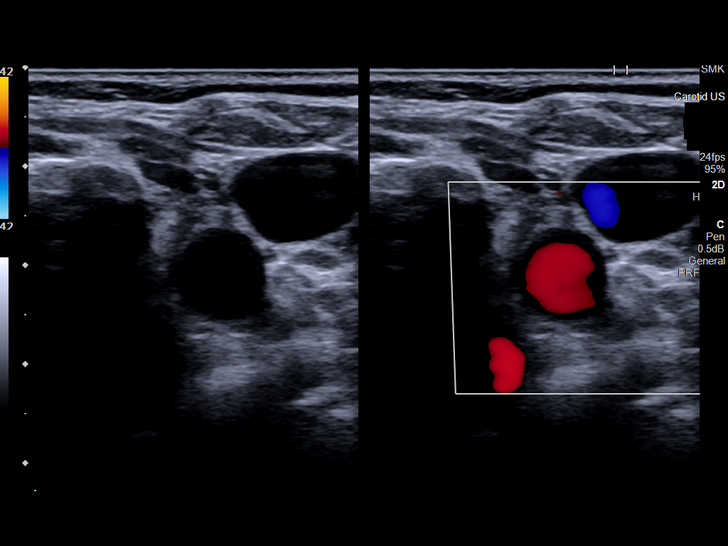
[im 46/62]
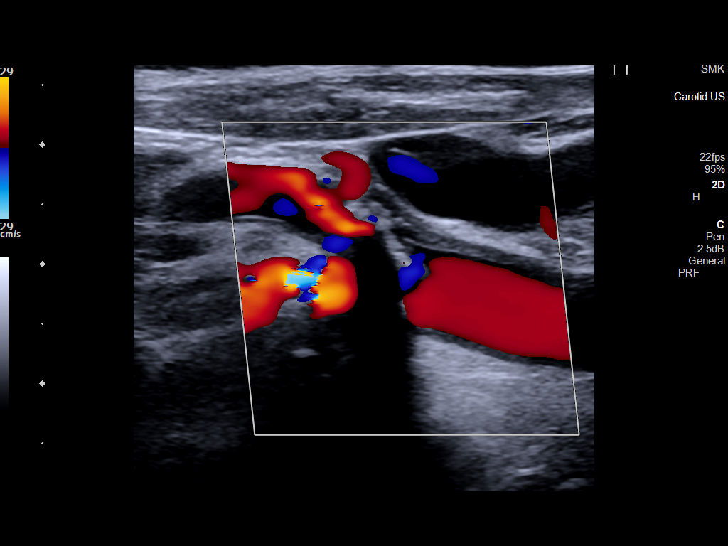
[im 51/62]
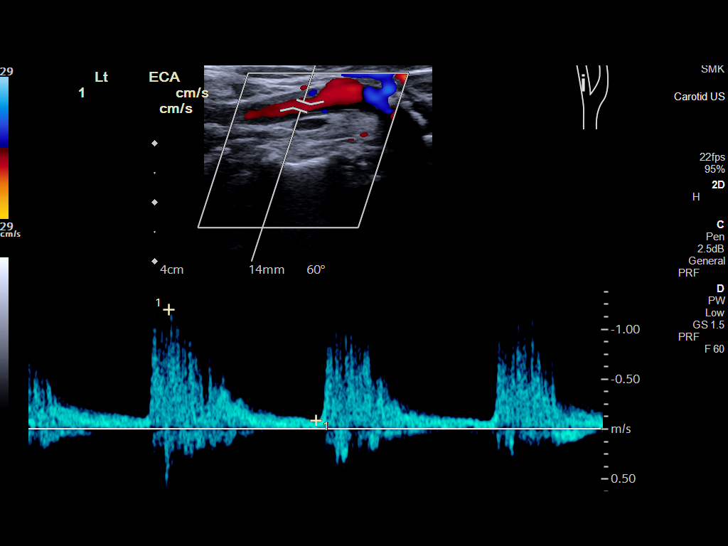
[im 56/62]
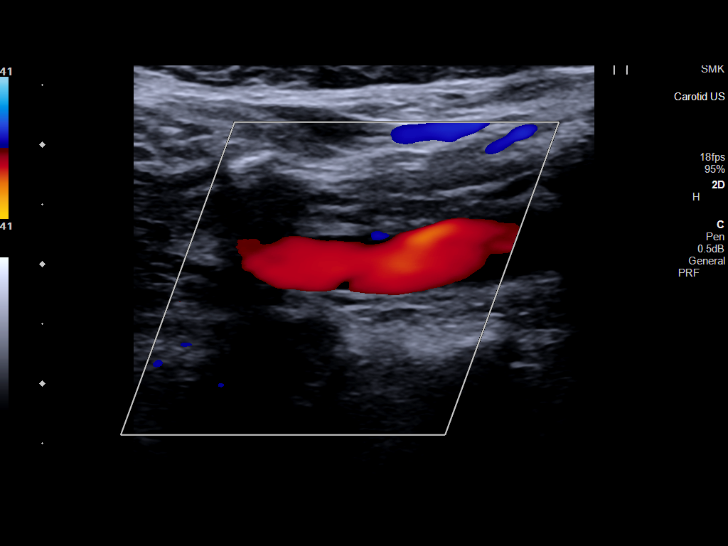
[im 62/62]
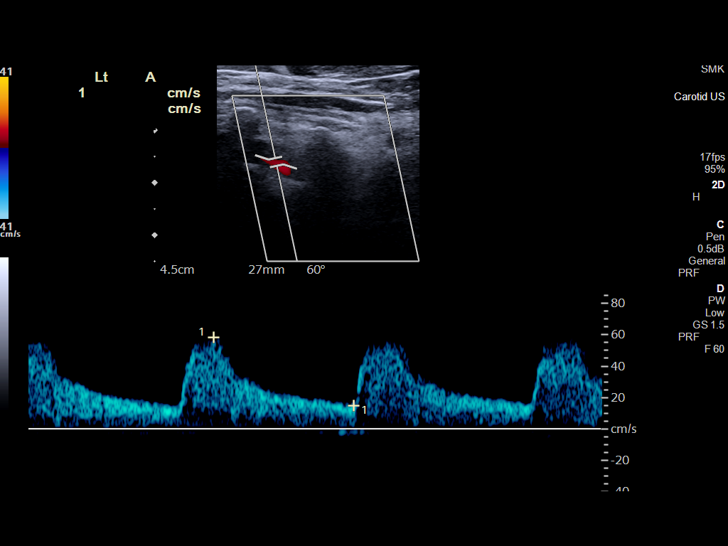

[13 of 24 positions shown; findings below may reference images not displayed]

FINDINGS: Criteria: Quantification of carotid stenosis is based on velocity
parameters that correlate the residual internal carotid diameter
with NASCET-based stenosis levels, using the diameter of the distal
internal carotid lumen as the denominator for stenosis measurement.

The following velocity measurements were obtained:

RIGHT

ICA: 78/20 cm/sec

CCA: 71/11 cm/sec

SYSTOLIC ICA/CCA RATIO:

ECA: 85 cm/sec

LEFT

ICA: 209/61 cm/sec

CCA: 43/11 cm/sec

SYSTOLIC ICA/CCA RATIO:

ECA: 120 cm/sec

RIGHT CAROTID ARTERY: Moderate tortuosity. Eccentric calcified
plaque in the mid common carotid artery without stenosis. Eccentric
calcified plaque in the bulb and proximal ICA resulting in mild
stenosis. Normal waveforms and color Doppler signal.

RIGHT VERTEBRAL ARTERY:  Normal flow direction and waveform.

LEFT CAROTID ARTERY: Mild tortuosity. Calcified plaque in the bulb
resulting in some areas of distal acoustic shadowing. Elevated peak
systolic velocities in the mid ICA. Normal color Doppler signal.

LEFT VERTEBRAL ARTERY:  Normal flow direction and waveform.
IMPRESSION: 1. Bilateral carotid bifurcation plaque with less than 50% diameter
stenosis on the right, estimated 50-69% diameter stenosis left ICA.
2.  Antegrade bilateral vertebral arterial flow.

## 2020-11-19 IMAGING — MR MR HEAD WO/W CM
11 series · 48 of 48 positions shown · IV contrast (gadavist)
Comparison: None.

CLINICAL DATA: Balance disturbance.

EXAM:
MRI HEAD WITHOUT AND WITH CONTRAST
TECHNIQUE: Multiplanar, multiecho pulse sequences of the brain and surrounding
structures were obtained without and with intravenous contrast.
CONTRAST:  6mL GADAVIST GADOBUTROL 1 MMOL/ML IV SOLN

[Series 5: cor dwi_tracew · coronal · 5.0mm · 1.31mm/px · 2 of 38 slices shown]
[im 1/38]
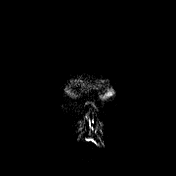
[im 38/38]
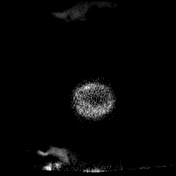

[Series 6: cor dwi_adc · coronal · 5.0mm · 1.31mm/px · 3 of 38 slices shown]
[im 1/38]
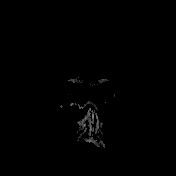
[im 19/38]
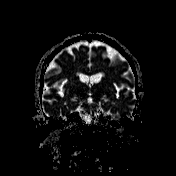
[im 38/38]
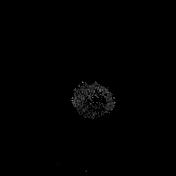

[Series 7: T1 · sagittal · 5.0mm · 0.62mm/px · 1 of 21 slices shown (1 of 2)]
[im 1/21]
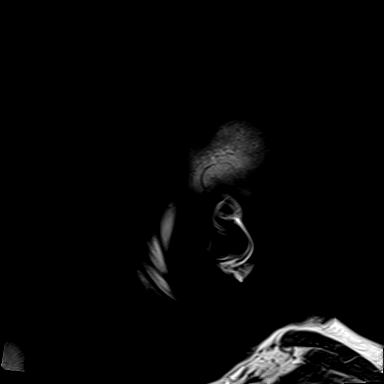

[Series 8: T2 · axial · 5.0mm · 0.53mm/px · z∈[-115,+27]mm · 2 of 25 slices shown]
[im 1/25]
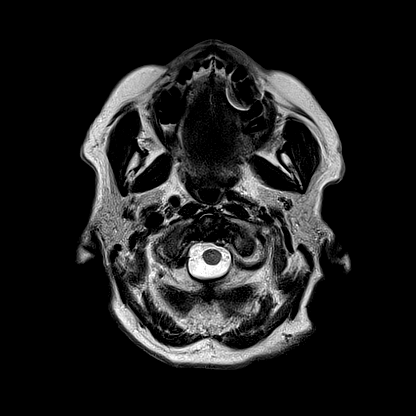
[im 25/25]
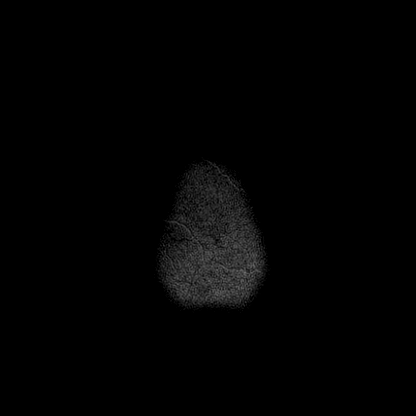

[Series 10: pha_images · axial · 3.0mm · 0.90mm/px · z∈[-121,+31]mm · 4 of 51 slices shown]
[im 1/51]
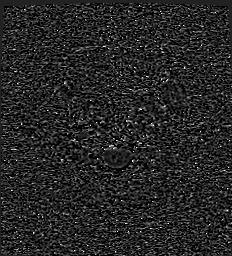
[im 17/51]
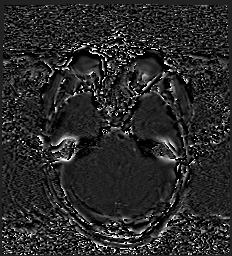
[im 34/51]
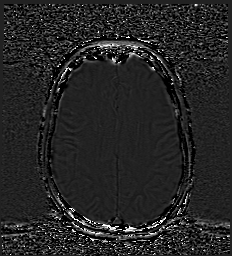
[im 51/51]
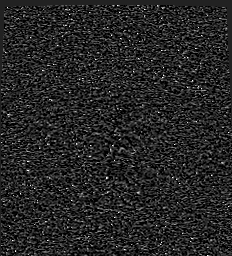

[Series 11: swi_images · axial · 3.0mm · 0.90mm/px · z∈[-121,+31]mm · 4 of 52 slices shown]
[im 1/52]
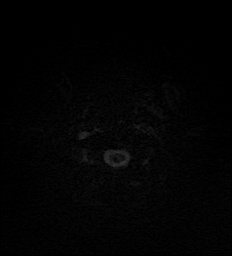
[im 18/52]
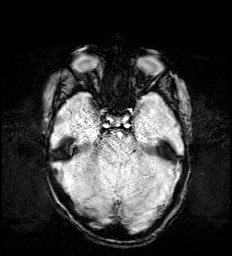
[im 35/52]
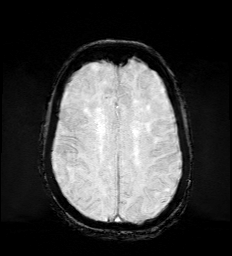
[im 52/52]
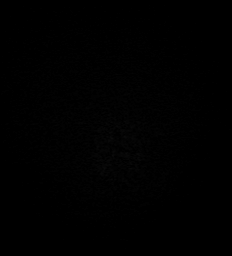

[Series 13: FLAIR · axial · 3.0mm · 0.53mm/px · z∈[-124,+36]mm · 4 of 55 slices shown]
[im 1/55]
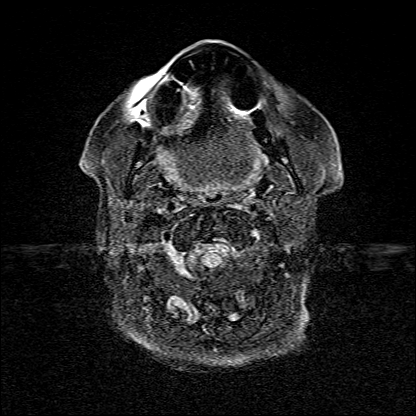
[im 19/55]
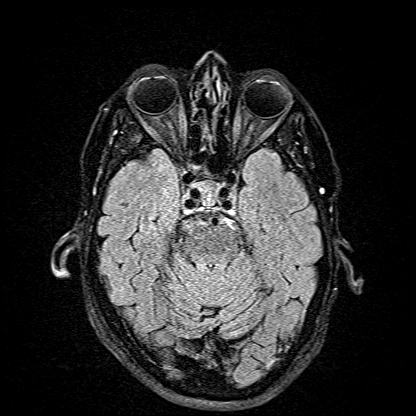
[im 37/55]
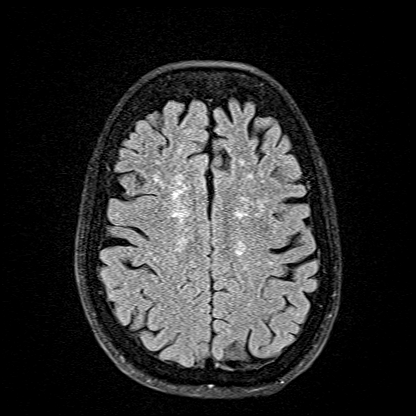
[im 55/55]
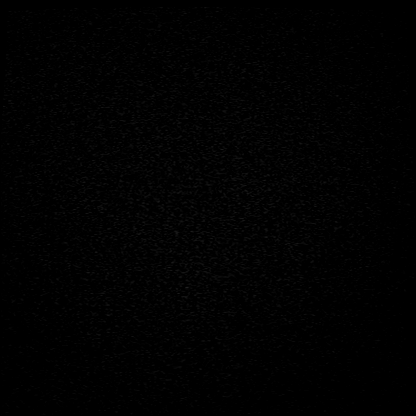

[Series 14: T1 · axial · 1.0mm · 0.98mm/px · z∈[-133,+41]mm · 12 of 175 slices shown (2 of 2)]
[im 1/175]
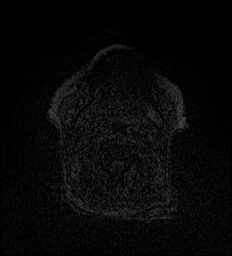
[im 16/175]
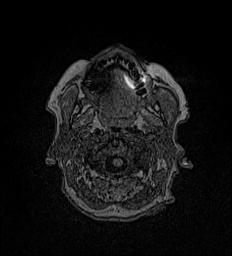
[im 32/175]
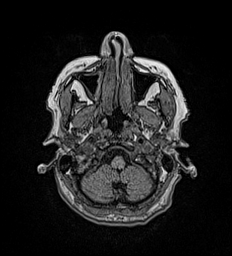
[im 48/175]
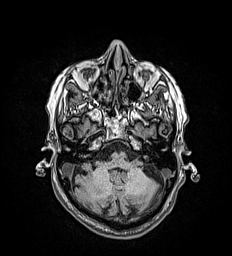
[im 64/175]
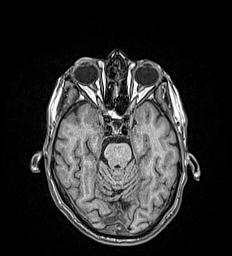
[im 80/175]
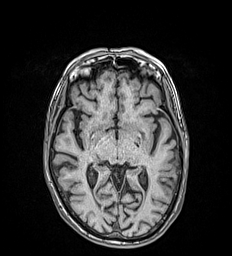
[im 95/175]
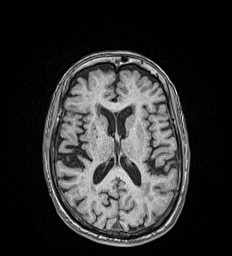
[im 111/175]
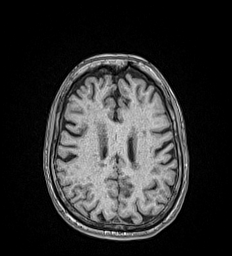
[im 127/175]
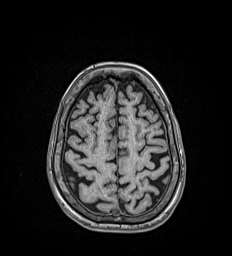
[im 143/175]
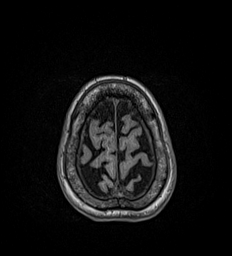
[im 159/175]
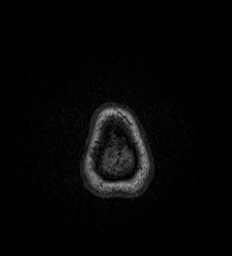
[im 175/175]
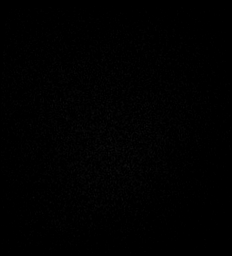

[Series 15: T2 post-contrast · coronal · 5.0mm · 0.57mm/px · 2 of 29 slices shown]
[im 1/29]
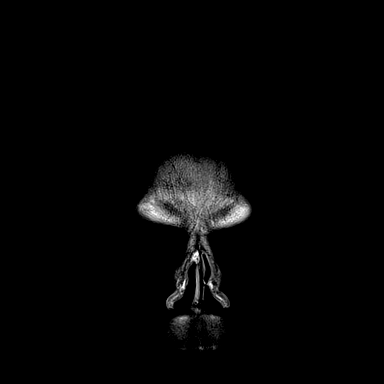
[im 29/29]
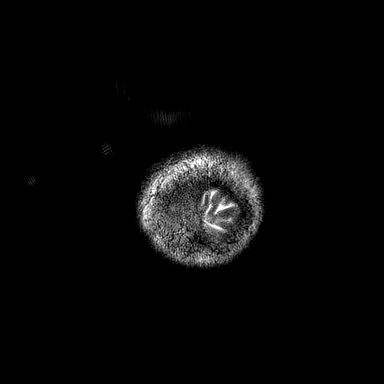

[Series 16: T1 post-contrast · axial · 1.0mm · 0.98mm/px · z∈[-133,+41]mm · 12 of 175 slices shown (1 of 2)]
[im 1/175]
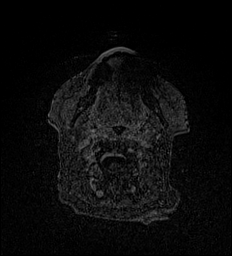
[im 16/175]
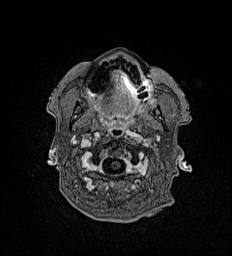
[im 32/175]
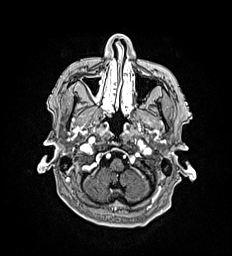
[im 48/175]
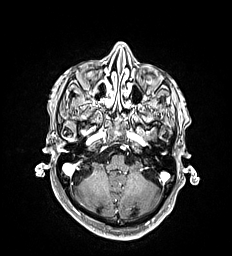
[im 64/175]
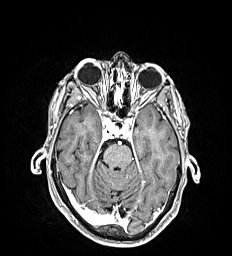
[im 80/175]
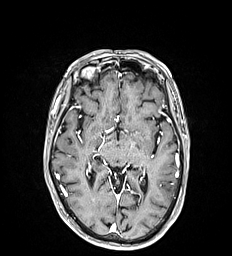
[im 95/175]
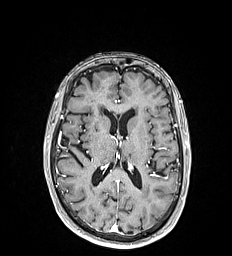
[im 111/175]
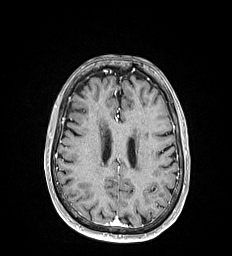
[im 127/175]
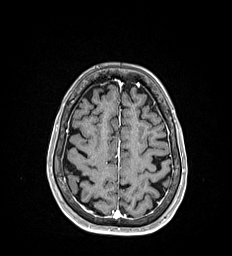
[im 143/175]
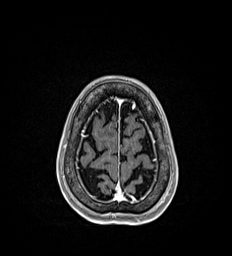
[im 159/175]
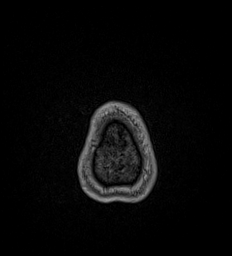
[im 175/175]
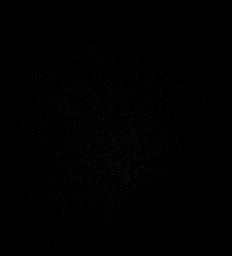

[Series 17: T1 post-contrast · coronal · 5.0mm · 0.57mm/px · 2 of 29 slices shown (2 of 2)]
[im 1/29]
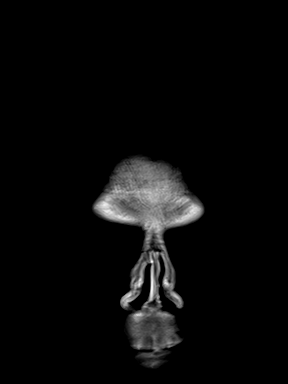
[im 29/29]
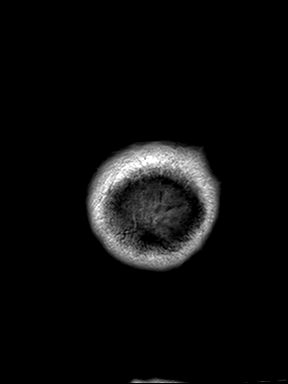

[48 of 48 positions shown; findings below may reference images not displayed]

FINDINGS: Brain: Diffusion imaging does not show any acute or subacute
infarction. Mild chronic small-vessel change affects the dorsal
pons. No focal cerebellar finding. Old small vessel infarction of
the left thalamus. Moderate chronic small-vessel ischemic changes of
the hemispheric deep and subcortical white matter. No cortical or
large vessel territory infarction. No mass lesion, hemorrhage,
hydrocephalus or extra-axial collection. After contrast
administration, no abnormal enhancement occurs.

Vascular: Major vessels at the base of the brain show flow.

Skull and upper cervical spine: Negative

Sinuses/Orbits: Clear except for mild mucosal thickening of the
maxillary sinuses and right frontal sinus. Orbits negative.

Other: No fluid in the middle ears or mastoids.
IMPRESSION: No acute or reversible finding. Moderate chronic small-vessel
ischemic changes throughout the brain as outlined above.

## 2020-11-19 MED ORDER — GADOBUTROL 1 MMOL/ML IV SOLN
6.0000 mL | Freq: Once | INTRAVENOUS | Status: AC | PRN
  Administered 2020-11-19: 6 mL via INTRAVENOUS

## 2020-11-22 ENCOUNTER — Other Ambulatory Visit: Payer: Self-pay | Admitting: Family

## 2020-11-22 DIAGNOSIS — D649 Anemia, unspecified: Secondary | ICD-10-CM

## 2020-11-22 NOTE — Progress Notes (Signed)
Subjective:   Andrea Hart is a 84 y.o. female who presents for an Initial Medicare Annual Wellness Visit.  I connected with Andrea Hart today by telephone and verified that I am speaking with the correct person using two identifiers. Location patient: home Location provider: work Persons participating in the virtual visit: patient, provider.   I discussed the limitations, risks, security and privacy concerns of performing an evaluation and management service by telephone and the availability of in person appointments. I also discussed with the patient that there may be a patient responsible charge related to this service. The patient expressed understanding and verbally consented to this telephonic visit.    Interactive audio and video telecommunications were attempted between this provider and patient, however failed, due to patient having technical difficulties OR patient did not have access to video capability.  We continued and completed visit with audio only.   Review of Systems    N/A  Cardiac Risk Factors include: advanced age (>53men, >90 women);dyslipidemia;hypertension     Objective:    There were no vitals filed for this visit. There is no height or weight on file to calculate BMI.  Advanced Directives 11/23/2020  Does Patient Have a Medical Advance Directive? Yes  Type of Paramedic of [place];Living will  Copy of Colp in Chart? No - copy requested    Current Medications (verified) Outpatient Encounter Medications as of 11/23/2020  Medication Sig  . albuterol (VENTOLIN HFA) 108 (90 Base) MCG/ACT inhaler Inhale 2 puffs into the lungs every 4 (four) hours as needed for wheezing or shortness of breath.  Marland Kitchen amLODipine (NORVASC) 10 MG tablet Take 1 tablet (10 mg total) by mouth daily.  . APPLE CIDER VINEGAR PO Take 2 tablets by mouth daily.  Marland Kitchen aspirin EC 81 MG tablet Take 81 mg by mouth daily.  Marland Kitchen atenolol (TENORMIN) 25 MG  tablet atenolol 25 mg tablet  . Bromfenac Sodium (PROLENSA) 0.07 % SOLN Prolensa 0.07 % eye drops  . budesonide-formoterol (SYMBICORT) 160-4.5 MCG/ACT inhaler Symbicort 160 mcg-4.5 mcg/actuation HFA aerosol inhaler  . bupivacaine (MARCAINE) 0.5 % injection bupivacaine HCl 0.5 % (5 mg/mL) injection solution  Take 4 mL by injection route.  . carvedilol (COREG) 12.5 MG tablet Take 1 tablet (12.5 mg total) by mouth 2 (two) times daily.  . celecoxib (CELEBREX) 200 MG capsule Celebrex 200 mg capsule  . cetirizine (ZYRTEC) 10 MG tablet Take 1 tablet (10 mg total) by mouth daily.  . chlorthalidone (HYGROTON) 25 MG tablet Take 1 tablet (25 mg) by mouth once daily in the morning  . clonazePAM (KLONOPIN) 0.5 MG tablet 0.5 mg 2 (two) times daily as needed  . clopidogrel (PLAVIX) 75 MG tablet Take 1 tablet (75 mg total) by mouth daily.  . diclofenac Sodium (VOLTAREN) 1 % GEL Voltaren 1 % topical gel  . enalapril (VASOTEC) 10 MG tablet Take 1 tablet (10 mg total) by mouth 2 (two) times daily.  Marland Kitchen esomeprazole (NEXIUM) 40 MG capsule Nexium 40 mg capsule,delayed release  . ezetimibe-simvastatin (VYTORIN) 10-40 MG tablet Vytorin 10 mg-40 mg tablet  . FeFum-FePoly-FA-B Cmp-C-Biot (INTEGRA PLUS) CAPS TAKE ONE CAPSULE BY MOUTH DAILY  . Fluticasone-Salmeterol (ADVAIR) 500-50 MCG/DOSE AEPB Inhale 1 puff into the lungs 2 (two) times daily.  . furosemide (LASIX) 20 MG tablet furosemide 20 mg tablet  . levothyroxine (SYNTHROID) 88 MCG tablet TAKE ONE TABLET BY MOUTH DAILY BEFORE BREAKFAST  . montelukast (SINGULAIR) 10 MG tablet TAKE ONE TABLET BY MOUTH  AT BEDTIME  . Multiple Vitamin (MULTIVITAMIN) capsule Take 1 capsule by mouth daily.  Marland Kitchen omeprazole (PRILOSEC) 40 MG capsule Take 1 capsule (40 mg total) by mouth 2 (two) times daily.  . potassium chloride (KLOR-CON) 10 MEQ tablet Take 2 tablets (20 mEq total) by mouth 2 (two) times daily.  . rosuvastatin (CRESTOR) 20 MG tablet Take 1 tablet (20 mg total) by mouth daily.   . sertraline (ZOLOFT) 100 MG tablet 100 mg once daily  . simvastatin (ZOCOR) 40 MG tablet 40 mg nightly  . ticagrelor (BRILINTA) 90 MG TABS tablet 90 mg once  . Turmeric 500 MG TABS Take 1,000 mg by mouth daily.  Marland Kitchen VITAMIN D PO Take 2,000 Units by mouth daily.   Marland Kitchen zinc gluconate 50 MG tablet Take 50 mg by mouth daily.  . [DISCONTINUED] FeFum-FePoly-FA-B Cmp-C-Biot (INTEGRA PLUS) CAPS TAKE ONE CAPSULE BY MOUTH DAILY  . [DISCONTINUED] meloxicam (MOBIC) 7.5 MG tablet TAKE ONE TABLET BY MOUTH TWICE A DAY   No facility-administered encounter medications on file as of 11/23/2020.    Allergies (verified) Lidocaine   History: Past Medical History:  Diagnosis Date  . Arthritis   . Asthma   . Coronary artery disease    a. 2013 s/p PCI and stent placement in [facility] done for stable angina.  . Hyperlipidemia   . Hypertension   . Hypothyroidism   . Stroke [facility])    a. 2017 - ILR did not show afib.   Past Surgical History:  Procedure Laterality Date  . APPENDECTOMY    . CATARACT EXTRACTION Bilateral   . CHOLECYSTECTOMY    . CORONARY ANGIOPLASTY  2013   1xStent [facility] [place] .  Marland Kitchen EYE SURGERY    . HAND SURGERY Right   . LOOP RECORDER IMPLANT    . NISSEN FUNDOPLICATION  2536  . REPLACEMENT TOTAL KNEE Bilateral 2017  . TOTAL HIP ARTHROPLASTY Right   . TOTAL VAGINAL HYSTERECTOMY     Family History  Problem Relation Age of Onset  . Heart Problems Mother   . Heart attack Mother   . Heart Problems Father   . Heart attack Brother   . Heart Problems Brother   . Colon cancer Neg Hx   . Breast cancer Neg Hx    Social History   Socioeconomic History  . Marital status: Widowed    Spouse name: Not on file  . Number of children: 2  . Years of education: Not on file  . Highest education level: Some college, no degree  Occupational History    Comment: retired Science writer  Tobacco Use  . Smoking status: Never Smoker  . Smokeless tobacco: Never Used  Vaping Use  .  Vaping Use: Never used  Substance and Sexual Activity  . Alcohol use: Not Currently    Comment: Occasional  . Drug use: Never  . Sexual activity: Never    Birth control/protection: Surgical  Other Topics Concern  . Not on file  Social History Narrative   Moved here in 2019 , had been living in [place].    Born here, raised daughters here.    Lives with daughter now, [place].   Lost one daughter in car wreck.    Social Determinants of Health   Financial Resource Strain: Low Risk   . Difficulty of Paying Living Expenses: Not hard at all  Food Insecurity: No Food Insecurity  . Worried About Charity fundraiser in the Last Year: Never true  . Ran Out of  Food in the Last Year: Never true  Transportation Needs: No Transportation Needs  . Lack of Transportation (Medical): No  . Lack of Transportation (Non-Medical): No  Physical Activity: Inactive  . Days of Exercise per Week: 0 days  . Minutes of Exercise per Session: 0 min  Stress: No Stress Concern Present  . Feeling of Stress : Not at all  Social Connections: Moderately Integrated  . Frequency of Communication with Friends and Family: Three times a week  . Frequency of Social Gatherings with Friends and Family: Not on file  . Attends Religious Services: More than 4 times per year  . Active Member of Clubs or Organizations: Yes  . Attends Archivist Meetings: More than 4 times per year  . Marital Status: Widowed    Tobacco Counseling Counseling given: Not Answered   Clinical Intake:  Pre-visit preparation completed: Yes  Pain : No/denies pain     Nutritional Risks: None Diabetes: No  How often do you need to have someone help you when you read instructions, pamphlets, or other written materials from your doctor or pharmacy?: 1 - Never  Diabetic? No  Interpreter Needed?: No  Information entered by :: [facility], LPN   Activities of Daily Living In your present state of health, do you have any  difficulty performing the following activities: 11/23/2020 08/13/2020  Hearing? Y Y  Comment Does not wear hearing aids but does have hearing loss. -  Vision? N N  Difficulty concentrating or making decisions? N N  Walking or climbing stairs? N N  Dressing or bathing? N N  Doing errands, shopping? N N  Preparing Food and eating ? N -  Using the Toilet? N -  In the past six months, have you accidently leaked urine? N -  Do you have problems with loss of bowel control? N -  Managing your Medications? N -  Managing your Finances? N -  Housekeeping or managing your Housekeeping? N -  Some recent data might be hidden    Patient Care Team: Bacigalupo, Dionne Bucy, MD as PCP - General (Family Medicine) Wellington Hampshire, MD as PCP - Cardiology (Cardiology) Vladimir Crofts, MD as Consulting Physician (Neurology) Murlean Iba, MD (Nephrology) Clyde Canterbury, MD as Referring Physician (Otolaryngology) Urbano Heir as Physician Assistant (Orthopedic Surgery)  Indicate any recent Medical Services you may have received from other than [facility] providers in the past year (date may be approximate).     Assessment:   This is a routine wellness examination for [place].  Hearing/Vision screen No exam data present  Dietary issues and exercise activities discussed: Current Exercise Habits: The patient does not participate in regular exercise at present, Exercise limited by: orthopedic condition(s)  Goals   None    Depression Screen PHQ 2/9 Scores 08/13/2020 12/29/2019 04/29/2019 07/29/2018  PHQ - 2 Score 0 0 0 0  PHQ- 9 Score 0 0 0 -    Fall Risk Fall Risk  11/23/2020 08/13/2020 12/29/2019 04/29/2019 07/29/2018  Falls in the past year? 0 0 0 0 No  Number falls in past yr: 0 0 0 0 -  Injury with Fall? 0 0 0 0 -  Risk for fall due to : - Impaired balance/gait - - -  Follow up - Falls evaluation completed;Falls prevention discussed Falls evaluation completed Falls evaluation completed -     FALL RISK PREVENTION PERTAINING TO THE HOME:  Any stairs in or around the home? Yes  If so, are there  any without handrails? No  Home free of loose throw rugs in walkways, pet beds, electrical cords, etc? Yes  Adequate lighting in your home to reduce risk of falls? Yes   ASSISTIVE DEVICES UTILIZED TO PREVENT FALLS:  Life alert? No  Use of a cane, walker or w/c? No  Grab bars in the bathroom? Yes  Shower chair or bench in shower? No  Elevated toilet seat or a handicapped toilet? No   Cognitive Function: Normal cognitive status assessed by observation by this Nurse Health Advisor. No abnormalities found.          Immunizations Immunization History  Administered Date(s) Administered  . Influenza, High Dose Seasonal PF 09/02/2020  . PFIZER SARS-COV-2 Vaccination 08/03/2020, 09/02/2020    TDAP status: Due, Education has been provided regarding the importance of this vaccine. Advised may receive this vaccine at local pharmacy or Health Dept. Aware to provide a copy of the vaccination record if obtained from local pharmacy or Health Dept. Verbalized acceptance and understanding.  Flu Vaccine status: Up to date  Pneumococcal vaccine status: Due, Education has been provided regarding the importance of this vaccine. Advised may receive this vaccine at local pharmacy or Health Dept. Aware to provide a copy of the vaccination record if obtained from local pharmacy or Health Dept. Verbalized acceptance and understanding.  Covid-19 vaccine status: Completed vaccines  Qualifies for Shingles Vaccine? Yes   Zostavax completed: Pt is unsure. Reequested records to up date chart.   Shingrix Completed?: Yes. Pt is unsure. Reequested records to up date chart.   Screening Tests Health Maintenance  Topic Date Due  . TETANUS/TDAP  Never done  . DEXA SCAN  Never done  . PNA vac Low Risk Adult (1 of 2 - PCV13) Never done  . COVID-19 Vaccine (3 - Booster for Pfizer series) 03/02/2021  .  INFLUENZA VACCINE  Completed    Health Maintenance  Health Maintenance Due  Topic Date Due  . TETANUS/TDAP  Never done  . DEXA SCAN  Never done  . PNA vac Low Risk Adult (1 of 2 - PCV13) Never done    Colorectal cancer screening: No longer required.   Mammogram status: No longer required due to age.  Bone Density status: Completed per patient. Pt is unsure of results. Requested records.   Lung Cancer Screening: (Low Dose CT Chest recommended if Age 94-80 years, 30 pack-year currently smoking OR have quit w/in 15years.) does not qualify.   Additional Screening:  Vision Screening: Recommended annual ophthalmology exams for early detection of glaucoma and other disorders of the eye. Is the patient up to date with their annual eye exam?  Yes  as of 08/2020 Who is the provider or what is the name of the office in which the patient attends annual eye exams? Pt is unsure of provider but was completed in [facility]. If pt is not established with a provider, would they like to be referred to a provider to establish care? No .   Dental Screening: Recommended annual dental exams for proper oral hygiene  Community Resource Referral / Chronic Care Management: CRR required this visit?  No   CCM required this visit?  No      Plan:     I have personally reviewed and noted the following in the patient's chart:   . Medical and social history . Use of alcohol, tobacco or illicit drugs  . Current medications and supplements . Functional ability and status . Nutritional status . Physical activity .  Advanced directives . List of other physicians . Hospitalizations, surgeries, and ER visits in previous 12 months . Vitals . Screenings to include cognitive, depression, and falls . Referrals and appointments  In addition, I have reviewed and discussed with patient certain preventive protocols, quality metrics, and best practice recommendations. A written personalized care plan for  preventive services as well as general preventive health recommendations were provided to patient.     Jaleiah Asay Maria Stein, [place]   [id number]   Nurse Notes: Pt states she has completed a DEXA scan and received all vaccines that are due. Requested previous records to up date chart.

## 2020-11-23 ENCOUNTER — Ambulatory Visit (INDEPENDENT_AMBULATORY_CARE_PROVIDER_SITE_OTHER): Payer: Medicare Other

## 2020-11-23 ENCOUNTER — Ambulatory Visit: Payer: Medicare Other | Admitting: Family Medicine

## 2020-11-23 ENCOUNTER — Other Ambulatory Visit: Payer: Self-pay

## 2020-11-23 DIAGNOSIS — Z0000 Encounter for general adult medical examination without abnormal findings: Secondary | ICD-10-CM

## 2020-11-23 NOTE — Patient Instructions (Signed)
Andrea Hart , Thank you for taking time to come for your Medicare Wellness Visit. I appreciate your ongoing commitment to your health goals. Please review the following plan we discussed and let me know if I can assist you in the future.   Screening recommendations/referrals: Colonoscopy: No longer required.  Mammogram: No longer required.  Bone Density: Completed at previous PCP per pt. Requested records.  Recommended yearly ophthalmology/optometry visit for glaucoma screening and checkup Recommended yearly dental visit for hygiene and checkup  Vaccinations: Influenza vaccine: Done 08/2020 Pneumococcal vaccine: Completed at previous PCP per pt. Requested records.  Tdap vaccine: Currently due, declined receiving. Shingles vaccine: Shingrix discussed. Please contact your pharmacy for coverage information.     Advanced directives: Please bring a copy of your POA (Power of Attorney) and/or Living Will to your next appointment.   Conditions/risks identified: Declined setting a yearly goal or making any diet changes.   Next appointment: 11/29/21 @ 2:00 PM for an AWV. Declined scheduling a follow up with PCP. Patient to have daughter call in to schedule follow up apt.   Preventive Care 4 Years and Older, Female Preventive care refers to lifestyle choices and visits with your health care provider that can promote health and wellness. What does preventive care include?  A yearly physical exam. This is also called an annual well check.  Dental exams once or twice a year.  Routine eye exams. Ask your health care provider how often you should have your eyes checked.  Personal lifestyle choices, including:  Daily care of your teeth and gums.  Regular physical activity.  Eating a healthy diet.  Avoiding tobacco and drug use.  Limiting alcohol use.  Practicing safe sex.  Taking low-dose aspirin every day.  Taking vitamin and mineral supplements as recommended by your health care  provider. What happens during an annual well check? The services and screenings done by your health care provider during your annual well check will depend on your age, overall health, lifestyle risk factors, and family history of disease. Counseling  Your health care provider may ask you questions about your:  Alcohol use.  Tobacco use.  Drug use.  Emotional well-being.  Home and relationship well-being.  Sexual activity.  Eating habits.  History of falls.  Memory and ability to understand (cognition).  Work and work Statistician.  Reproductive health. Screening  You may have the following tests or measurements:  Height, weight, and BMI.  Blood pressure.  Lipid and cholesterol levels. These may be checked every 5 years, or more frequently if you are over 40 years old.  Skin check.  Lung cancer screening. You may have this screening every year starting at age 58 if you have a 30-pack-year history of smoking and currently smoke or have quit within the past 15 years.  Fecal occult blood test (FOBT) of the stool. You may have this test every year starting at age 69.  Flexible sigmoidoscopy or colonoscopy. You may have a sigmoidoscopy every 5 years or a colonoscopy every 10 years starting at age 64.  Hepatitis C blood test.  Hepatitis B blood test.  Sexually transmitted disease (STD) testing.  Diabetes screening. This is done by checking your blood sugar (glucose) after you have not eaten for a while (fasting). You may have this done every 1-3 years.  Bone density scan. This is done to screen for osteoporosis. You may have this done starting at age 6.  Mammogram. This may be done every 1-2 years. Talk to your  health care provider about how often you should have regular mammograms. Talk with your health care provider about your test results, treatment options, and if necessary, the need for more tests. Vaccines  Your health care provider may recommend certain  vaccines, such as:  Influenza vaccine. This is recommended every year.  Tetanus, diphtheria, and acellular pertussis (Tdap, Td) vaccine. You may need a Td booster every 10 years.  Zoster vaccine. You may need this after age 64.  Pneumococcal 13-valent conjugate (PCV13) vaccine. One dose is recommended after age 9.  Pneumococcal polysaccharide (PPSV23) vaccine. One dose is recommended after age 60. Talk to your health care provider about which screenings and vaccines you need and how often you need them. This information is not intended to replace advice given to you by your health care provider. Make sure you discuss any questions you have with your health care provider. Document Released: 12/17/2015 Document Revised: 08/09/2016 Document Reviewed: 09/21/2015 Elsevier Interactive Patient Education  2017 [place] Prevention in the Home Falls can cause injuries. They can happen to people of all ages. There are many things you can do to make your home safe and to help prevent falls. What can I do on the outside of my home?  Regularly fix the edges of walkways and driveways and fix any cracks.  Remove anything that might make you trip as you walk through a door, such as a raised step or threshold.  Trim any bushes or trees on the path to your home.  Use bright outdoor lighting.  Clear any walking paths of anything that might make someone trip, such as rocks or tools.  Regularly check to see if handrails are loose or broken. Make sure that both sides of any steps have handrails.  Any raised decks and porches should have guardrails on the edges.  Have any leaves, snow, or ice cleared regularly.  Use sand or salt on walking paths during winter.  Clean up any spills in your garage right away. This includes oil or grease spills. What can I do in the bathroom?  Use night lights.  Install grab bars by the toilet and in the tub and shower. Do not use towel bars as grab  bars.  Use non-skid mats or decals in the tub or shower.  If you need to sit down in the shower, use a plastic, non-slip stool.  Keep the floor dry. Clean up any water that spills on the floor as soon as it happens.  Remove soap buildup in the tub or shower regularly.  Attach bath mats securely with double-sided non-slip rug tape.  Do not have throw rugs and other things on the floor that can make you trip. What can I do in the bedroom?  Use night lights.  Make sure that you have a light by your bed that is easy to reach.  Do not use any sheets or blankets that are too big for your bed. They should not hang down onto the floor.  Have a firm chair that has side arms. You can use this for support while you get dressed.  Do not have throw rugs and other things on the floor that can make you trip. What can I do in the kitchen?  Clean up any spills right away.  Avoid walking on wet floors.  Keep items that you use a lot in easy-to-reach places.  If you need to reach something above you, use a strong step stool that has a  grab bar.  Keep electrical cords out of the way.  Do not use floor polish or wax that makes floors slippery. If you must use wax, use non-skid floor wax.  Do not have throw rugs and other things on the floor that can make you trip. What can I do with my stairs?  Do not leave any items on the stairs.  Make sure that there are handrails on both sides of the stairs and use them. Fix handrails that are broken or loose. Make sure that handrails are as long as the stairways.  Check any carpeting to make sure that it is firmly attached to the stairs. Fix any carpet that is loose or worn.  Avoid having throw rugs at the top or bottom of the stairs. If you do have throw rugs, attach them to the floor with carpet tape.  Make sure that you have a light switch at the top of the stairs and the bottom of the stairs. If you do not have them, ask someone to add them for  you. What else can I do to help prevent falls?  Wear shoes that:  Do not have high heels.  Have rubber bottoms.  Are comfortable and fit you well.  Are closed at the toe. Do not wear sandals.  If you use a stepladder:  Make sure that it is fully opened. Do not climb a closed stepladder.  Make sure that both sides of the stepladder are locked into place.  Ask someone to hold it for you, if possible.  Clearly mark and make sure that you can see:  Any grab bars or handrails.  First and last steps.  Where the edge of each step is.  Use tools that help you move around (mobility aids) if they are needed. These include:  Canes.  Walkers.  Scooters.  Crutches.  Turn on the lights when you go into a dark area. Replace any light bulbs as soon as they burn out.  Set up your furniture so you have a clear path. Avoid moving your furniture around.  If any of your floors are uneven, fix them.  If there are any pets around you, be aware of where they are.  Review your medicines with your doctor. Some medicines can make you feel dizzy. This can increase your chance of falling. Ask your doctor what other things that you can do to help prevent falls. This information is not intended to replace advice given to you by your health care provider. Make sure you discuss any questions you have with your health care provider. Document Released: 09/16/2009 Document Revised: 04/27/2016 Document Reviewed: 12/25/2014 Elsevier Interactive Patient Education  2017 [employer].

## 2021-01-04 ENCOUNTER — Other Ambulatory Visit: Payer: Self-pay

## 2021-01-04 ENCOUNTER — Ambulatory Visit (INDEPENDENT_AMBULATORY_CARE_PROVIDER_SITE_OTHER): Payer: Medicare Other | Admitting: Family Medicine

## 2021-01-04 VITALS — BP 151/81 | HR 56 | Temp 98.2°F | Ht 62.0 in | Wt 143.0 lb

## 2021-01-04 DIAGNOSIS — J4520 Mild intermittent asthma, uncomplicated: Secondary | ICD-10-CM

## 2021-01-04 DIAGNOSIS — I2510 Atherosclerotic heart disease of native coronary artery without angina pectoris: Secondary | ICD-10-CM | POA: Diagnosis not present

## 2021-01-04 DIAGNOSIS — Z9861 Coronary angioplasty status: Secondary | ICD-10-CM | POA: Diagnosis not present

## 2021-01-04 DIAGNOSIS — E039 Hypothyroidism, unspecified: Secondary | ICD-10-CM

## 2021-01-04 DIAGNOSIS — D509 Iron deficiency anemia, unspecified: Secondary | ICD-10-CM | POA: Diagnosis not present

## 2021-01-04 DIAGNOSIS — N1832 Chronic kidney disease, stage 3b: Secondary | ICD-10-CM

## 2021-01-04 DIAGNOSIS — E785 Hyperlipidemia, unspecified: Secondary | ICD-10-CM

## 2021-01-04 DIAGNOSIS — K219 Gastro-esophageal reflux disease without esophagitis: Secondary | ICD-10-CM

## 2021-01-04 DIAGNOSIS — I10 Essential (primary) hypertension: Secondary | ICD-10-CM

## 2021-01-04 MED ORDER — ENALAPRIL MALEATE 10 MG PO TABS: 10.0000 mg | ORAL_TABLET | Freq: Two times a day (BID) | ORAL | 1 refills | Status: DC

## 2021-01-04 MED ORDER — FLUTICASONE-SALMETEROL 500-50 MCG/DOSE IN AEPB: 1.0000 | INHALATION_SPRAY | Freq: Two times a day (BID) | RESPIRATORY_TRACT | 3 refills | Status: DC

## 2021-01-04 MED ORDER — ROSUVASTATIN CALCIUM 20 MG PO TABS: 20.0000 mg | ORAL_TABLET | Freq: Every day | ORAL | 1 refills | Status: DC

## 2021-01-04 MED ORDER — MONTELUKAST SODIUM 10 MG PO TABS
10.0000 mg | ORAL_TABLET | Freq: Every day | ORAL | 3 refills | Status: DC
Start: 2021-01-04 — End: 2021-12-21

## 2021-01-04 MED ORDER — AMLODIPINE BESYLATE 10 MG PO TABS: 10.0000 mg | ORAL_TABLET | Freq: Every day | ORAL | 3 refills | Status: DC

## 2021-01-04 MED ORDER — ALBUTEROL SULFATE HFA 108 (90 BASE) MCG/ACT IN AERS: 2.0000 | INHALATION_SPRAY | RESPIRATORY_TRACT | 1 refills | Status: DC | PRN

## 2021-01-04 MED ORDER — OMEPRAZOLE 40 MG PO CPDR
40.0000 mg | DELAYED_RELEASE_CAPSULE | Freq: Two times a day (BID) | ORAL | 3 refills | Status: DC
Start: 2021-01-04 — End: 2021-12-21

## 2021-01-04 MED ORDER — LEVOTHYROXINE SODIUM 88 MCG PO TABS: 88.0000 ug | ORAL_TABLET | Freq: Every day | ORAL | 1 refills | Status: DC

## 2021-01-04 MED ORDER — CLOPIDOGREL BISULFATE 75 MG PO TABS: 75.0000 mg | ORAL_TABLET | Freq: Every day | ORAL | 1 refills | Status: DC

## 2021-01-04 MED ORDER — CARVEDILOL 12.5 MG PO TABS: 12.5000 mg | ORAL_TABLET | Freq: Two times a day (BID) | ORAL | 1 refills | Status: DC

## 2021-01-04 MED ORDER — POTASSIUM CHLORIDE ER 10 MEQ PO TBCR: 20.0000 meq | EXTENDED_RELEASE_TABLET | Freq: Two times a day (BID) | ORAL | 3 refills | Status: DC

## 2021-01-04 MED ORDER — CETIRIZINE HCL 10 MG PO TABS
10.0000 mg | ORAL_TABLET | Freq: Every day | ORAL | 3 refills | Status: DC
Start: 2021-01-04 — End: 2022-01-23

## 2021-01-04 MED ORDER — CHLORTHALIDONE 25 MG PO TABS: ORAL_TABLET | ORAL | 1 refills | Status: DC

## 2021-01-04 NOTE — Progress Notes (Signed)
Established Patient Office Visit  Subjective:  Patient ID: Andrea Hart, female    DOB: Oct 16, 1936  Age: 85 y.o. MRN: [id number]  CC:  Chief Complaint  Patient presents with  . Hypertension  . Hyperlipidemia    HPI Andrea Hart presents for mandatory prescription office visit.  Home BPs in 140s/80s. Sees Cardiology q42m  Past Medical History:  Diagnosis Date  . Arthritis   . Asthma   . Coronary artery disease    a. 2013 s/p PCI and stent placement in [facility] [place] done for stable angina.  . Hyperlipidemia   . Hypertension   . Hypothyroidism   . Stroke [facility])    a. 2017 - ILR did not show afib.    Past Surgical History:  Procedure Laterality Date  . APPENDECTOMY    . CATARACT EXTRACTION Bilateral   . CHOLECYSTECTOMY    . CORONARY ANGIOPLASTY  2013   1xStent [facility] [place].  Marland Kitchen EYE SURGERY    . HAND SURGERY Right   . LOOP RECORDER IMPLANT    . NISSEN FUNDOPLICATION  [id number]  . REPLACEMENT TOTAL KNEE Bilateral 2017  . TOTAL HIP ARTHROPLASTY Right   . TOTAL VAGINAL HYSTERECTOMY      Family History  Problem Relation Age of Onset  . Heart Problems Mother   . Heart attack Mother   . Heart Problems Father   . Heart attack Brother   . Heart Problems Brother   . Colon cancer Neg Hx   . Breast cancer Neg Hx     Social History   Socioeconomic History  . Marital status: Widowed    Spouse name: Not on file  . Number of children: 2  . Years of education: Not on file  . Highest education level: Some college, no degree  Occupational History    Comment: retired Science writer  Tobacco Use  . Smoking status: Never Smoker  . Smokeless tobacco: Never Used  Vaping Use  . Vaping Use: Never used  Substance and Sexual Activity  . Alcohol use: Not Currently    Comment: Occasional  . Drug use: Never  . Sexual activity: Never    Birth control/protection: Surgical  Other Topics Concern  . Not on file  Social History Narrative   Moved here in 2019 , had been  living in [place].    Born here, raised daughters here.    Lives with daughter now, Hart.   Lost one daughter in car wreck.    Social Determinants of Health   Financial Resource Strain: Low Risk   . Difficulty of Paying Living Expenses: Not hard at all  Food Insecurity: No Food Insecurity  . Worried About Charity fundraiser in the Last Year: Never true  . Ran Out of Food in the Last Year: Never true  Transportation Needs: No Transportation Needs  . Lack of Transportation (Medical): No  . Lack of Transportation (Non-Medical): No  Physical Activity: Inactive  . Days of Exercise per Week: 0 days  . Minutes of Exercise per Session: 0 min  Stress: No Stress Concern Present  . Feeling of Stress : Not at all  Social Connections: Moderately Integrated  . Frequency of Communication with Friends and Family: Three times a week  . Frequency of Social Gatherings with Friends and Family: Not on file  . Attends Religious Services: More than 4 times per year  . Active Member of Clubs or Organizations: Yes  . Attends Archivist Meetings: More than 4 times  per year  . Marital Status: Widowed  Intimate Partner Violence: Not At Risk  . Fear of Current or Ex-Partner: No  . Emotionally Abused: No  . Physically Abused: No  . Sexually Abused: No    Outpatient Medications Prior to Visit  Medication Sig Dispense Refill  . aspirin EC 81 MG tablet Take 81 mg by mouth daily.    . Turmeric 500 MG TABS Take 1,000 mg by mouth daily.    Marland Kitchen albuterol (VENTOLIN HFA) 108 (90 Base) MCG/ACT inhaler Inhale 2 puffs into the lungs every 4 (four) hours as needed for wheezing or shortness of breath. 6.7 g 1  . carvedilol (COREG) 12.5 MG tablet Take 1 tablet (12.5 mg total) by mouth 2 (two) times daily. 180 tablet 1  . cetirizine (ZYRTEC) 10 MG tablet Take 1 tablet (10 mg total) by mouth daily. 30 tablet 0  . chlorthalidone (HYGROTON) 25 MG tablet Take 1 tablet (25 mg) by mouth once daily in the morning  90 tablet 1  . clopidogrel (PLAVIX) 75 MG tablet Take 1 tablet (75 mg total) by mouth daily. 90 tablet 1  . enalapril (VASOTEC) 10 MG tablet Take 1 tablet (10 mg total) by mouth 2 (two) times daily. 180 tablet 1  . FeFum-FePoly-FA-B Cmp-C-Biot (INTEGRA PLUS) CAPS TAKE ONE CAPSULE BY MOUTH DAILY 30 capsule 1  . Fluticasone-Salmeterol (ADVAIR) 500-50 MCG/DOSE AEPB Inhale 1 puff into the lungs 2 (two) times daily. 60 each 3  . levothyroxine (SYNTHROID) 88 MCG tablet TAKE ONE TABLET BY MOUTH DAILY BEFORE BREAKFAST 90 tablet 1  . montelukast (SINGULAIR) 10 MG tablet TAKE ONE TABLET BY MOUTH AT BEDTIME 90 tablet 1  . omeprazole (PRILOSEC) 40 MG capsule Take 1 capsule (40 mg total) by mouth 2 (two) times daily. 120 capsule 2  . potassium chloride (KLOR-CON) 10 MEQ tablet Take 2 tablets (20 mEq total) by mouth 2 (two) times daily. 120 tablet 3  . amLODipine (NORVASC) 10 MG tablet Take 1 tablet (10 mg total) by mouth daily. 180 tablet 1  . APPLE CIDER VINEGAR PO Take 2 tablets by mouth daily. (Patient not taking: Reported on 01/04/2021)    . atenolol (TENORMIN) 25 MG tablet atenolol 25 mg tablet (Patient not taking: Reported on 01/04/2021)    . Bromfenac Sodium (PROLENSA) 0.07 % SOLN Prolensa 0.07 % eye drops (Patient not taking: Reported on 01/04/2021)    . budesonide-formoterol (SYMBICORT) 160-4.5 MCG/ACT inhaler Symbicort 160 mcg-4.5 mcg/actuation HFA aerosol inhaler (Patient not taking: Reported on 01/04/2021)    . bupivacaine (MARCAINE) 0.5 % injection bupivacaine HCl 0.5 % (5 mg/mL) injection solution  Take 4 mL by injection route. (Patient not taking: Reported on 01/04/2021)    . celecoxib (CELEBREX) 200 MG capsule Celebrex 200 mg capsule (Patient not taking: Reported on 01/04/2021)    . clonazePAM (KLONOPIN) 0.5 MG tablet 0.5 mg 2 (two) times daily as needed (Patient not taking: Reported on 01/04/2021)    . diclofenac Sodium (VOLTAREN) 1 % GEL Voltaren 1 % topical gel (Patient not taking: Reported on 01/04/2021)     . esomeprazole (NEXIUM) 40 MG capsule Nexium 40 mg capsule,delayed release (Patient not taking: Reported on 01/04/2021)    . ezetimibe-simvastatin (VYTORIN) 10-40 MG tablet Vytorin 10 mg-40 mg tablet (Patient not taking: Reported on 01/04/2021)    . furosemide (LASIX) 20 MG tablet furosemide 20 mg tablet (Patient not taking: Reported on 01/04/2021)    . Multiple Vitamin (MULTIVITAMIN) capsule Take 1 capsule by mouth daily. (Patient not taking:  Reported on 01/04/2021)    . rosuvastatin (CRESTOR) 20 MG tablet Take 1 tablet (20 mg total) by mouth daily. 90 tablet 1  . sertraline (ZOLOFT) 100 MG tablet 100 mg once daily (Patient not taking: Reported on 01/04/2021)    . simvastatin (ZOCOR) 40 MG tablet 40 mg nightly (Patient not taking: Reported on 01/04/2021)    . ticagrelor (BRILINTA) 90 MG TABS tablet 90 mg once (Patient not taking: Reported on 01/04/2021)    . VITAMIN D PO Take 2,000 Units by mouth daily.  (Patient not taking: Reported on 01/04/2021)    . zinc gluconate 50 MG tablet Take 50 mg by mouth daily. (Patient not taking: Reported on 01/04/2021)     No facility-administered medications prior to visit.    Allergies  Allergen Reactions  . Lidocaine Rash    ROS Review of Systems  Constitutional: Negative.   HENT: Negative.   Eyes: Negative.   Respiratory: Negative.   Cardiovascular: Negative.   Gastrointestinal: Negative.   Endocrine: Negative.   Genitourinary: Negative.   Musculoskeletal: Negative.   Skin: Negative.   Allergic/Immunologic: Negative.   Neurological: Negative.   Hematological: Negative.   Psychiatric/Behavioral: Negative.       Objective:    Physical Exam Constitutional:      General: She is not in acute distress.    Appearance: Normal appearance. She is not diaphoretic.  HENT:     Head: Normocephalic and atraumatic.  Eyes:     General: No scleral icterus.    Conjunctiva/sclera: Conjunctivae normal.  Cardiovascular:     Rate and Rhythm: Normal rate and regular  rhythm.     Heart sounds: Normal heart sounds.  Pulmonary:     Effort: Pulmonary effort is normal. No respiratory distress.     Breath sounds: Normal breath sounds. No wheezing.  Abdominal:     General: There is no distension.     Palpations: Abdomen is soft.     Tenderness: There is no abdominal tenderness.  Musculoskeletal:     Right lower leg: No edema.     Left lower leg: No edema.  Neurological:     Mental Status: She is alert and oriented to person, place, and time. Mental status is at baseline.  Psychiatric:        Mood and Affect: Mood normal.        Behavior: Behavior normal.     BP (!) 151/81 (BP Location: Left Arm, Patient Position: Sitting, Cuff Size: Large)   Pulse (!) 56   Temp 98.2 F (36.8 C) (Oral)   Ht 5\' 2"  (1.575 m)   Wt 143 lb (64.9 kg)   BMI 26.16 kg/m  Wt Readings from Last 3 Encounters:  01/04/21 143 lb (64.9 kg)  08/13/20 139 lb 9.6 oz (63.3 kg)  07/19/20 140 lb (63.5 kg)     Health Maintenance Due  Topic Date Due  . TETANUS/TDAP  Never done  . DEXA SCAN  Never done  . PNA vac Low Risk Adult (1 of 2 - PCV13) Never done    There are no preventive care reminders to display for this patient.  Lab Results  Component Value Date   TSH 0.651 01/04/2021   Lab Results  Component Value Date   WBC 7.7 01/04/2021   HGB 11.0 (L) 01/04/2021   HCT 31.6 (L) 01/04/2021   MCV 88 01/04/2021   PLT 263 01/04/2021   Lab Results  Component Value Date   NA 139 01/04/2021   K 4.6  01/04/2021   CO2 22 01/04/2021   GLUCOSE 110 (H) 01/04/2021   BUN 25 01/04/2021   CREATININE 1.19 (H) 01/04/2021   BILITOT 0.3 08/16/2020   ALKPHOS 78 08/16/2020   AST 23 08/16/2020   ALT 18 08/16/2020   PROT 6.4 08/16/2020   ALBUMIN 4.3 01/04/2021   CALCIUM 9.5 01/04/2021   ANIONGAP 6 02/05/2020   GFR 36.97 (L) 12/19/2019   Lab Results  Component Value Date   CHOL 145 08/16/2020   Lab Results  Component Value Date   HDL 60 08/16/2020   Lab Results  Component  Value Date   LDLCALC 65 08/16/2020   Lab Results  Component Value Date   TRIG 111 08/16/2020   Lab Results  Component Value Date   CHOLHDL 3 12/19/2019   No results found for: HGBA1C    Assessment & Plan:   Problem List Items Addressed This Visit      Cardiovascular and Mediastinum   Essential hypertension    Slightly elevated today, but well controlled on home readings Also followed by cardiology Recheck metabolic panel No medication changes F/u in 6 months      Relevant Medications   potassium chloride (KLOR-CON) 10 MEQ tablet   rosuvastatin (CRESTOR) 20 MG tablet   enalapril (VASOTEC) 10 MG tablet   clopidogrel (PLAVIX) 75 MG tablet   chlorthalidone (HYGROTON) 25 MG tablet   carvedilol (COREG) 12.5 MG tablet   amLODipine (NORVASC) 10 MG tablet   Other Relevant Orders   Renal Function Panel (Completed)   CAD (coronary artery disease)    S/p stent placement Continue DAPT Followed by cardiology      Relevant Medications   rosuvastatin (CRESTOR) 20 MG tablet   enalapril (VASOTEC) 10 MG tablet   chlorthalidone (HYGROTON) 25 MG tablet   carvedilol (COREG) 12.5 MG tablet   amLODipine (NORVASC) 10 MG tablet     Respiratory   Mild intermittent asthma    Chronic and well controlled No changes to medications      Relevant Medications   montelukast (SINGULAIR) 10 MG tablet   Fluticasone-Salmeterol (ADVAIR) 500-50 MCG/DOSE AEPB   albuterol (VENTOLIN HFA) 108 (90 Base) MCG/ACT inhaler     Digestive   Gastroesophageal reflux disease without esophagitis    Chronic and stable Continue PPI - despite risks, she will likely continue this given h/o esophagitis      Relevant Medications   omeprazole (PRILOSEC) 40 MG capsule     Endocrine   Hypothyroidism    Previously well controlled Continue Synthroid at current dose  Recheck TSH and adjust Synthroid as indicated        Relevant Medications   levothyroxine (SYNTHROID) 88 MCG tablet   carvedilol (COREG)  12.5 MG tablet   Other Relevant Orders   TSH (Completed)     Genitourinary   CKD (chronic kidney disease) - Primary    Followed by Nephrology conitnue ACEi Avoid nephrotoxic meds Recheck metabolic panel      Relevant Orders   Renal Function Panel (Completed)   CBC (Completed)   Fe+TIBC+Fer (Completed)     Other   Anemia    Chronic IDA Stopped iron supplement Recheck CBC and iron panel      Relevant Orders   CBC (Completed)   Fe+TIBC+Fer (Completed)   Hyperlipidemia    Previously well controlled Continue statin Repeat FLP and CMP Goal LDL < 70 Followed by cardiology also      Relevant Medications   rosuvastatin (CRESTOR) 20 MG tablet  enalapril (VASOTEC) 10 MG tablet   chlorthalidone (HYGROTON) 25 MG tablet   carvedilol (COREG) 12.5 MG tablet   amLODipine (NORVASC) 10 MG tablet   H/O coronary angioplasty   Relevant Medications   clopidogrel (PLAVIX) 75 MG tablet      Follow-up: Return in about 6 months (around 07/04/2021) for chronic disease f/u.    I, Lavon Paganini, MD, have reviewed all documentation for this visit. The documentation on 01/06/21 for the exam, diagnosis, procedures, and orders are all accurate and complete.   Andrea Hart, Dionne Bucy, MD, MPH [facility]

## 2021-01-05 LAB — IRON,TIBC AND FERRITIN PANEL
Ferritin: 180 ng/mL — ABNORMAL HIGH (ref 15–150)
Iron Saturation: 23 % (ref 15–55)
Iron: 72 ug/dL (ref 27–139)
Total Iron Binding Capacity: 311 ug/dL (ref 250–450)
UIBC: 239 ug/dL (ref 118–369)

## 2021-01-05 LAB — RENAL FUNCTION PANEL
Albumin: 4.3 g/dL (ref 3.6–4.6)
BUN/Creatinine Ratio: 21 (ref 12–28)
BUN: 25 mg/dL (ref 8–27)
CO2: 22 mmol/L (ref 20–29)
Calcium: 9.5 mg/dL (ref 8.7–10.3)
Chloride: 104 mmol/L (ref 96–106)
Creatinine, Ser: 1.19 mg/dL — ABNORMAL HIGH (ref 0.57–1.00)
GFR calc Af Amer: 49 mL/min/{1.73_m2} — ABNORMAL LOW (ref >59)
GFR calc non Af Amer: 43 mL/min/{1.73_m2} — ABNORMAL LOW (ref >59)
Glucose: 110 mg/dL — ABNORMAL HIGH (ref 65–99)
Phosphorus: 4.2 mg/dL (ref 3.0–4.3)
Potassium: 4.6 mmol/L (ref 3.5–5.2)
Sodium: 139 mmol/L (ref 134–144)

## 2021-01-05 LAB — CBC
Hematocrit: 31.6 % — ABNORMAL LOW (ref 34.0–46.6)
Hemoglobin: 11 g/dL — ABNORMAL LOW (ref 11.1–15.9)
MCH: 30.6 pg (ref 26.6–33.0)
MCHC: 34.8 g/dL (ref 31.5–35.7)
MCV: 88 fL (ref 79–97)
Platelets: 263 10*3/uL (ref 150–450)
RBC: 3.59 x10E6/uL — ABNORMAL LOW (ref 3.77–5.28)
RDW: 11.6 % — ABNORMAL LOW (ref 11.7–15.4)
WBC: 7.7 10*3/uL (ref 3.4–10.8)

## 2021-01-05 LAB — TSH: TSH: 0.651 u[IU]/mL (ref 0.450–4.500)

## 2021-01-06 DIAGNOSIS — M67312 Transient synovitis, left shoulder: Secondary | ICD-10-CM | POA: Diagnosis not present

## 2021-01-06 NOTE — Assessment & Plan Note (Signed)
Previously well controlled Continue Synthroid at current dose  Recheck TSH and adjust Synthroid as indicated   

## 2021-01-06 NOTE — Assessment & Plan Note (Signed)
Slightly elevated today, but well controlled on home readings Also followed by cardiology Recheck metabolic panel No medication changes F/u in 6 months

## 2021-01-06 NOTE — Assessment & Plan Note (Signed)
Followed by Nephrology conitnue ACEi Avoid nephrotoxic meds Recheck metabolic panel

## 2021-01-06 NOTE — Assessment & Plan Note (Signed)
Previously well controlled Continue statin Repeat FLP and CMP Goal LDL < 70 Followed by cardiology also

## 2021-01-06 NOTE — Assessment & Plan Note (Signed)
Chronic IDA Stopped iron supplement Recheck CBC and iron panel

## 2021-01-06 NOTE — Assessment & Plan Note (Signed)
Chronic and stable Continue PPI - despite risks, she will likely continue this given h/o esophagitis

## 2021-01-06 NOTE — Assessment & Plan Note (Signed)
Chronic and well controlled No changes to medications

## 2021-01-06 NOTE — Assessment & Plan Note (Signed)
S/p stent placement Continue DAPT Followed by cardiology

## 2021-01-07 ENCOUNTER — Telehealth: Payer: Self-pay

## 2021-01-07 NOTE — Telephone Encounter (Signed)
-----   Message from Virginia Crews, MD sent at 01/05/2021  4:06 PM EST ----- Stable kidney function and anemia.  Iron levels are normal, but would recommend rechecking in 3 months after she has been off of the supplement longer.  Normal thyroid function.

## 2021-02-02 ENCOUNTER — Encounter: Payer: Self-pay | Admitting: Emergency Medicine

## 2021-02-02 ENCOUNTER — Emergency Department: Payer: Medicare Other

## 2021-02-02 ENCOUNTER — Other Ambulatory Visit: Payer: Self-pay

## 2021-02-02 ENCOUNTER — Inpatient Hospital Stay
Admission: EM | Admit: 2021-02-02 | Discharge: 2021-02-02 | DRG: 378 | Discharge status: Against Medical Advice | Payer: Medicare Other | Attending: Family Medicine | Admitting: Family Medicine

## 2021-02-02 DIAGNOSIS — J9811 Atelectasis: Secondary | ICD-10-CM | POA: Diagnosis not present

## 2021-02-02 DIAGNOSIS — Z79890 Hormone replacement therapy (postmenopausal): Secondary | ICD-10-CM

## 2021-02-02 DIAGNOSIS — K5791 Diverticulosis of intestine, part unspecified, without perforation or abscess with bleeding: Principal | ICD-10-CM | POA: Diagnosis present

## 2021-02-02 DIAGNOSIS — Z96641 Presence of right artificial hip joint: Secondary | ICD-10-CM | POA: Diagnosis present

## 2021-02-02 DIAGNOSIS — Z7902 Long term (current) use of antithrombotics/antiplatelets: Secondary | ICD-10-CM | POA: Diagnosis not present

## 2021-02-02 DIAGNOSIS — K625 Hemorrhage of anus and rectum: Secondary | ICD-10-CM

## 2021-02-02 DIAGNOSIS — I2510 Atherosclerotic heart disease of native coronary artery without angina pectoris: Secondary | ICD-10-CM | POA: Diagnosis present

## 2021-02-02 DIAGNOSIS — Z8673 Personal history of transient ischemic attack (TIA), and cerebral infarction without residual deficits: Secondary | ICD-10-CM | POA: Diagnosis not present

## 2021-02-02 DIAGNOSIS — I10 Essential (primary) hypertension: Secondary | ICD-10-CM | POA: Diagnosis not present

## 2021-02-02 DIAGNOSIS — Z96653 Presence of artificial knee joint, bilateral: Secondary | ICD-10-CM | POA: Diagnosis present

## 2021-02-02 DIAGNOSIS — Z20822 Contact with and (suspected) exposure to covid-19: Secondary | ICD-10-CM | POA: Diagnosis present

## 2021-02-02 DIAGNOSIS — D62 Acute posthemorrhagic anemia: Secondary | ICD-10-CM | POA: Diagnosis not present

## 2021-02-02 DIAGNOSIS — K922 Gastrointestinal hemorrhage, unspecified: Secondary | ICD-10-CM | POA: Diagnosis not present

## 2021-02-02 DIAGNOSIS — I517 Cardiomegaly: Secondary | ICD-10-CM | POA: Diagnosis not present

## 2021-02-02 DIAGNOSIS — Z8249 Family history of ischemic heart disease and other diseases of the circulatory system: Secondary | ICD-10-CM

## 2021-02-02 DIAGNOSIS — E039 Hypothyroidism, unspecified: Secondary | ICD-10-CM | POA: Diagnosis present

## 2021-02-02 DIAGNOSIS — N183 Chronic kidney disease, stage 3 unspecified: Secondary | ICD-10-CM | POA: Diagnosis present

## 2021-02-02 DIAGNOSIS — I1310 Hypertensive heart and chronic kidney disease without heart failure, with stage 1 through stage 4 chronic kidney disease, or unspecified chronic kidney disease: Secondary | ICD-10-CM | POA: Diagnosis present

## 2021-02-02 DIAGNOSIS — R001 Bradycardia, unspecified: Secondary | ICD-10-CM | POA: Diagnosis not present

## 2021-02-02 DIAGNOSIS — K921 Melena: Secondary | ICD-10-CM | POA: Diagnosis not present

## 2021-02-02 DIAGNOSIS — E785 Hyperlipidemia, unspecified: Secondary | ICD-10-CM | POA: Diagnosis present

## 2021-02-02 DIAGNOSIS — Z955 Presence of coronary angioplasty implant and graft: Secondary | ICD-10-CM | POA: Diagnosis not present

## 2021-02-02 DIAGNOSIS — Z79899 Other long term (current) drug therapy: Secondary | ICD-10-CM

## 2021-02-02 DIAGNOSIS — Z7951 Long term (current) use of inhaled steroids: Secondary | ICD-10-CM | POA: Diagnosis not present

## 2021-02-02 DIAGNOSIS — J45909 Unspecified asthma, uncomplicated: Secondary | ICD-10-CM | POA: Diagnosis present

## 2021-02-02 DIAGNOSIS — Z7982 Long term (current) use of aspirin: Secondary | ICD-10-CM

## 2021-02-02 HISTORY — DX: Gastrointestinal hemorrhage, unspecified: K92.2

## 2021-02-02 LAB — CBC WITH DIFFERENTIAL/PLATELET
Abs Immature Granulocytes: 0.06 10*3/uL (ref 0.00–0.07)
Basophils Absolute: 0.1 10*3/uL (ref 0.0–0.1)
Basophils Relative: 1 %
Eosinophils Absolute: 0.4 10*3/uL (ref 0.0–0.5)
Eosinophils Relative: 6 %
HCT: 27.5 % — ABNORMAL LOW (ref 36.0–46.0)
Hemoglobin: 8.9 g/dL — ABNORMAL LOW (ref 12.0–15.0)
Immature Granulocytes: 1 %
Lymphocytes Relative: 24 %
Lymphs Abs: 1.4 10*3/uL (ref 0.7–4.0)
MCH: 30.3 pg (ref 26.0–34.0)
MCHC: 32.4 g/dL (ref 30.0–36.0)
MCV: 93.5 fL (ref 80.0–100.0)
Monocytes Absolute: 0.6 10*3/uL (ref 0.1–1.0)
Monocytes Relative: 11 %
Neutro Abs: 3.2 10*3/uL (ref 1.7–7.7)
Neutrophils Relative %: 57 %
Platelets: 275 10*3/uL (ref 150–400)
RBC: 2.94 MIL/uL — ABNORMAL LOW (ref 3.87–5.11)
RDW: 12.3 % (ref 11.5–15.5)
WBC: 5.7 10*3/uL (ref 4.0–10.5)
nRBC: 0 % (ref 0.0–0.2)

## 2021-02-02 LAB — URINALYSIS, COMPLETE (UACMP) WITH MICROSCOPIC
Bilirubin Urine: NEGATIVE
Glucose, UA: NEGATIVE mg/dL
Ketones, ur: NEGATIVE mg/dL
Nitrite: NEGATIVE
Protein, ur: NEGATIVE mg/dL
Specific Gravity, Urine: 1.011 (ref 1.005–1.030)
pH: 5 (ref 5.0–8.0)

## 2021-02-02 LAB — COMPREHENSIVE METABOLIC PANEL
ALT: 11 U/L (ref 0–44)
AST: 16 U/L (ref 15–41)
Albumin: 3.7 g/dL (ref 3.5–5.0)
Alkaline Phosphatase: 60 U/L (ref 38–126)
Anion gap: 8 (ref 5–15)
BUN: 28 mg/dL — ABNORMAL HIGH (ref 8–23)
CO2: 19 mmol/L — ABNORMAL LOW (ref 22–32)
Calcium: 8.8 mg/dL — ABNORMAL LOW (ref 8.9–10.3)
Chloride: 109 mmol/L (ref 98–111)
Creatinine, Ser: 1.27 mg/dL — ABNORMAL HIGH (ref 0.44–1.00)
GFR, Estimated: 42 mL/min — ABNORMAL LOW (ref >60)
Glucose, Bld: 137 mg/dL — ABNORMAL HIGH (ref 70–99)
Potassium: 4.5 mmol/L (ref 3.5–5.1)
Sodium: 136 mmol/L (ref 135–145)
Total Bilirubin: 0.5 mg/dL (ref 0.3–1.2)
Total Protein: 6.5 g/dL (ref 6.5–8.1)

## 2021-02-02 LAB — HEMOGLOBIN AND HEMATOCRIT, BLOOD
HCT: 26.3 % — ABNORMAL LOW (ref 36.0–46.0)
HCT: 22.7 % — ABNORMAL LOW (ref 36.0–46.0)
Hemoglobin: 8.6 g/dL — ABNORMAL LOW (ref 12.0–15.0)
Hemoglobin: 7.4 g/dL — ABNORMAL LOW (ref 12.0–15.0)

## 2021-02-02 LAB — TROPONIN I (HIGH SENSITIVITY): Troponin I (High Sensitivity): 9 ng/L (ref <18)

## 2021-02-02 LAB — RESP PANEL BY RT-PCR (FLU A&B, COVID) ARPGX2
Influenza A by PCR: NEGATIVE
Influenza B by PCR: NEGATIVE
SARS Coronavirus 2 by RT PCR: NEGATIVE

## 2021-02-02 LAB — LIPASE, BLOOD: Lipase: 30 U/L (ref 11–51)

## 2021-02-02 IMAGING — DX DG CHEST 1V PORT
1 series · 1 of 1 positions shown · non-contrast
Comparison: No prior.

CLINICAL DATA: GI bleed.

EXAM:
PORTABLE CHEST 1 VIEW

[chest ap]
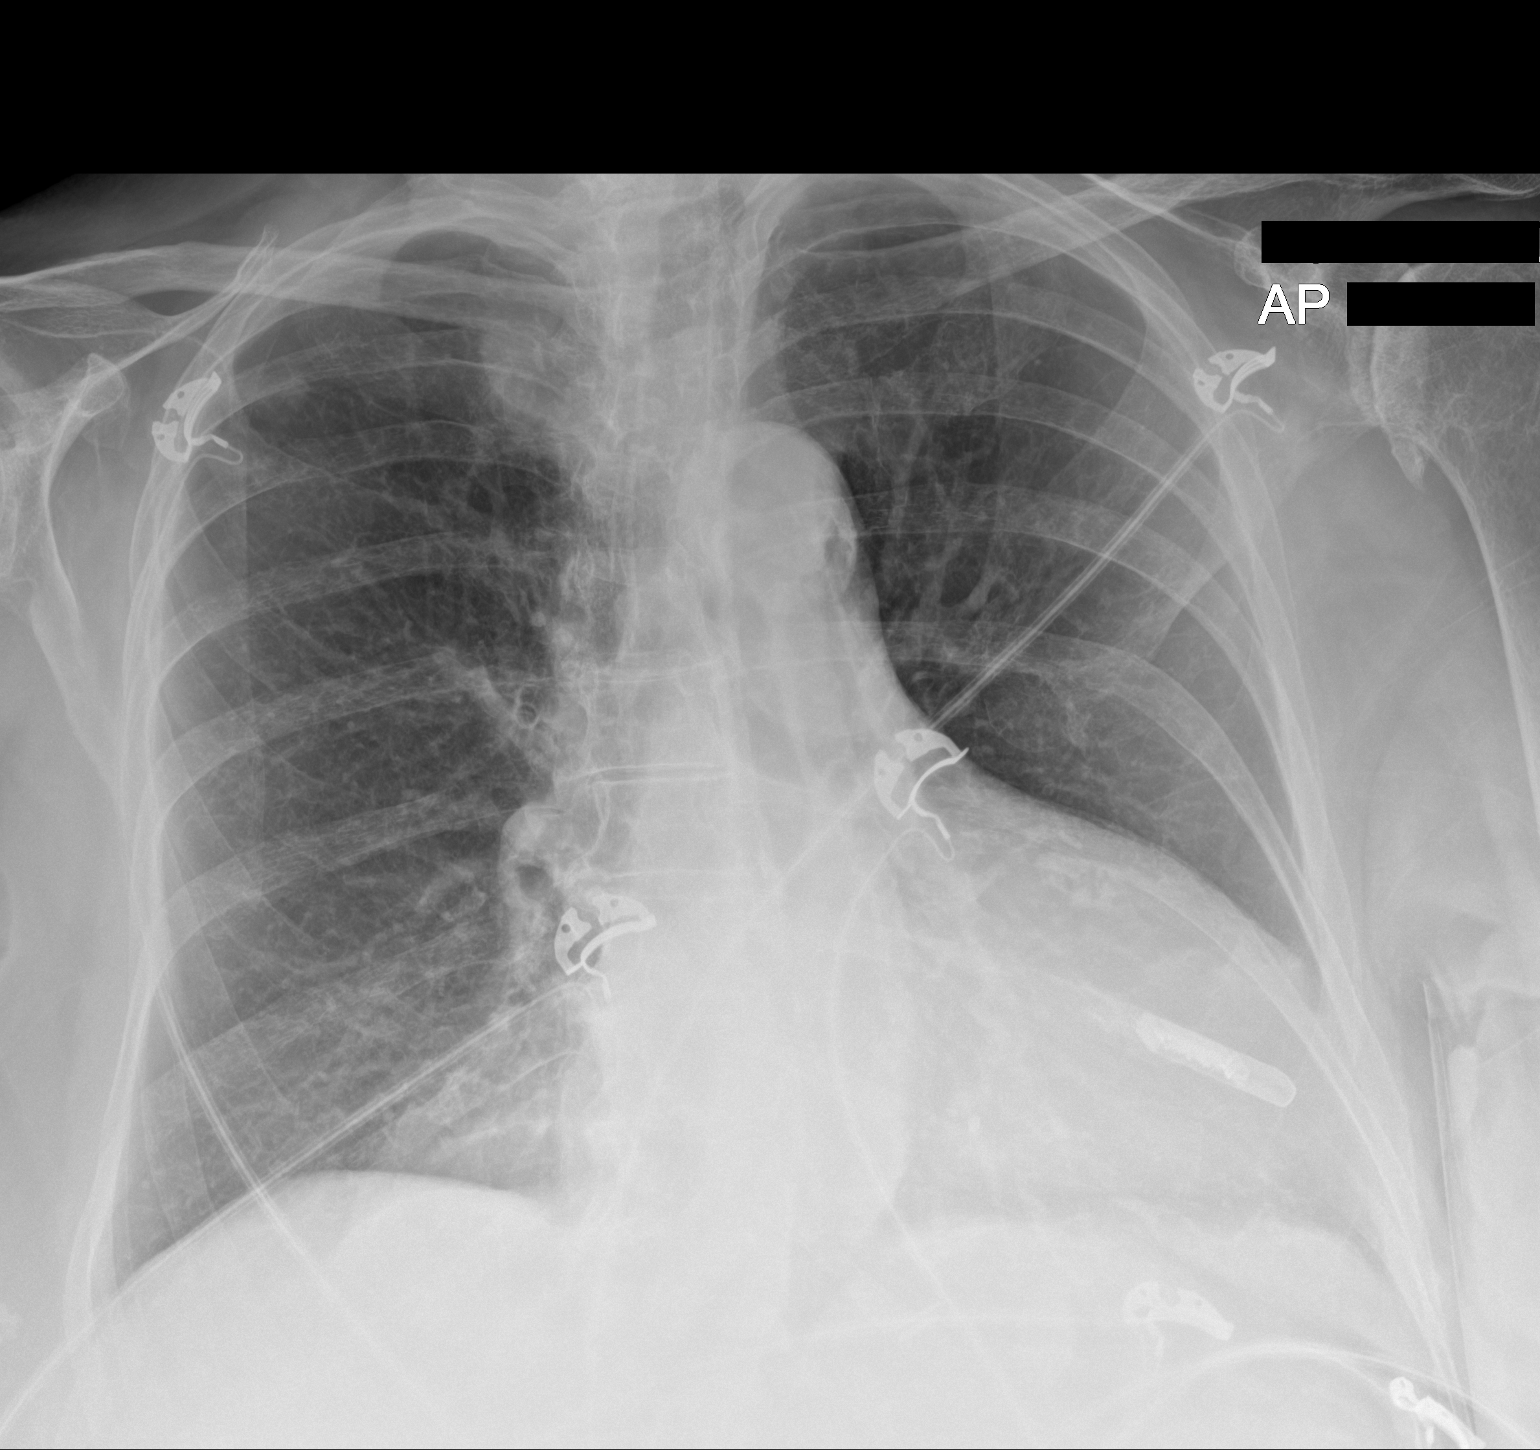

[1 of 1 positions shown; findings below may reference images not displayed]

FINDINGS: Mild prominence of the right upper mediastinum, most likely
prominent great vessels. Hilar structures normal. Cardiac monitor
device noted. Cardiomegaly. No pulmonary venous congestion.
Prominent epicardial fat pad. Mild bibasilar atelectasis. No pleural
effusion or pneumothorax. Degenerative changes thoracic spine.
IMPRESSION: 1. Cardiomegaly. No pulmonary venous congestion.
2.  Mild bibasilar atelectasis.

## 2021-02-02 MED ORDER — AMLODIPINE BESYLATE 10 MG PO TABS
10.0000 mg | ORAL_TABLET | Freq: Every day | ORAL | Status: DC
Start: 2021-02-02 — End: 2021-02-03
  Administered 2021-02-02: 10 mg via ORAL
  Filled 2021-02-02: qty 1

## 2021-02-02 MED ORDER — MOMETASONE FURO-FORMOTEROL FUM 200-5 MCG/ACT IN AERO
2.0000 | INHALATION_SPRAY | Freq: Two times a day (BID) | RESPIRATORY_TRACT | Status: DC
  Filled 2021-02-02 (×2): qty 8.8

## 2021-02-02 MED ORDER — CARVEDILOL 12.5 MG PO TABS
12.5000 mg | ORAL_TABLET | Freq: Two times a day (BID) | ORAL | Status: DC
Start: 2021-02-02 — End: 2021-02-03
  Administered 2021-02-02: 12.5 mg via ORAL
  Filled 2021-02-02: qty 1

## 2021-02-02 MED ORDER — ROSUVASTATIN CALCIUM 10 MG PO TABS
20.0000 mg | ORAL_TABLET | Freq: Every day | ORAL | Status: DC
  Administered 2021-02-02: 20 mg via ORAL
  Filled 2021-02-02: qty 2
  Filled 2021-02-02: qty 1

## 2021-02-02 MED ORDER — SODIUM CHLORIDE 0.9 % IV BOLUS
1000.0000 mL | Freq: Once | INTRAVENOUS | Status: AC
  Administered 2021-02-02: 1000 mL via INTRAVENOUS

## 2021-02-02 MED ORDER — ONDANSETRON HCL 4 MG PO TABS: 4.0000 mg | ORAL_TABLET | Freq: Four times a day (QID) | ORAL | Status: DC | PRN

## 2021-02-02 MED ORDER — ONDANSETRON HCL 4 MG/2ML IJ SOLN: 4.0000 mg | Freq: Four times a day (QID) | INTRAMUSCULAR | Status: DC | PRN

## 2021-02-02 MED ORDER — MONTELUKAST SODIUM 10 MG PO TABS
10.0000 mg | ORAL_TABLET | Freq: Every day | ORAL | Status: DC
  Filled 2021-02-02: qty 1

## 2021-02-02 MED ORDER — ACETAMINOPHEN 650 MG RE SUPP: 650.0000 mg | Freq: Four times a day (QID) | RECTAL | Status: DC | PRN

## 2021-02-02 MED ORDER — LEVOTHYROXINE SODIUM 88 MCG PO TABS
88.0000 ug | ORAL_TABLET | Freq: Every day | ORAL | Status: DC
  Filled 2021-02-02: qty 1

## 2021-02-02 MED ORDER — ALBUTEROL SULFATE HFA 108 (90 BASE) MCG/ACT IN AERS
2.0000 | INHALATION_SPRAY | RESPIRATORY_TRACT | Status: DC | PRN
  Filled 2021-02-02: qty 6.7

## 2021-02-02 MED ORDER — LORATADINE 10 MG PO TABS
10.0000 mg | ORAL_TABLET | Freq: Every day | ORAL | Status: DC
  Administered 2021-02-02: 10 mg via ORAL
  Filled 2021-02-02: qty 1

## 2021-02-02 MED ORDER — ACETAMINOPHEN 325 MG PO TABS: 650.0000 mg | ORAL_TABLET | Freq: Four times a day (QID) | ORAL | Status: DC | PRN

## 2021-02-02 MED ORDER — SODIUM CHLORIDE 0.9 % IV SOLN: INTRAVENOUS | Status: DC

## 2021-02-02 MED ORDER — PANTOPRAZOLE SODIUM 40 MG PO TBEC
40.0000 mg | DELAYED_RELEASE_TABLET | Freq: Every day | ORAL | Status: DC
  Administered 2021-02-02: 40 mg via ORAL
  Filled 2021-02-02: qty 1

## 2021-02-02 NOTE — ED Notes (Signed)
Pt ambulatory to ED room 9 from waiting room.

## 2021-02-02 NOTE — H&P (Signed)
History and Physical    Andrea Hart DOB: 01-26-1936 DOA: 02/02/2021  PCP: Virginia Crews, MD   Patient coming from: Home  I have personally briefly reviewed patient's old medical records in [place]  Chief Complaint: Bright red blood per rectum  HPI: Andrea Hart is a 85 y.o. female with medical history significant for hypertension, hypothyroidism, history of coronary artery disease status post stent angioplasty, history of asthma who presents to the emergency room via private vehicle for evaluation of multiple episodes of rectal bleeding.  Patient denies having any prior episodes of rectal bleeding and states that she had woken up in the early hours of the morning to use the bathroom and all she passed was bright red blood with clots.  She had several more episodes which were concerning to her and prompted her visit to the ER.  She denies having any abdominal pain.  She denies NSAID use.  She is on aspirin and Plavix. She denied feeling dizzy or lightheaded.  She denied having any shortness of breath, no chest pain, no nausea, no vomiting, no palpitations, no diaphoresis, no urinary frequency, no nocturia, no dysuria, no headache, no fever, no chills, no focal deficits or blurred vision. Labs show sodium 136, potassium 4.5, chloride 109, bicarb 19, glucose 137, BUN 28, creatinine 1.27, calcium 8.8, alkaline phosphatase 60, albumin 3.7, lipase 30, AST 16, ALT 11, total protein 6.5, troponin 9, white count 5.7, hemoglobin 8.9 g/dl down from 11 g/dl 1 month ago, MCV 27.5, RDW 12.3, platelet count 275 Chest x-ray reviewed by me shows cardiomegaly. No pulmonary venous congestion. Mild bibasilar atelectasis. Twelve-lead EKG reviewed by me shows sinus rhythm   ED Course: Patient is an 85 year old Caucasian female who presents to the emergency room for evaluation of multiple episodes of painless rectal bleeding.  Patient is on aspirin and Plavix for her history of coronary  artery disease.  She has had a 2 g drop in her H&H and will be admitted to the hospital for further evaluation.   Review of Systems: As per HPI otherwise all other systems reviewed and negative.    Past Medical History:  Diagnosis Date  . Arthritis   . Asthma   . Coronary artery disease    a. 2013 s/p PCI and stent placement in [facility] done for stable angina.  . Hyperlipidemia   . Hypertension   . Hypothyroidism   . Stroke [facility])    a. 2017 - ILR did not show afib.    Past Surgical History:  Procedure Laterality Date  . APPENDECTOMY    . CATARACT EXTRACTION Bilateral   . CHOLECYSTECTOMY    . CORONARY ANGIOPLASTY  2013   1xStent [facility] [place].  Marland Kitchen EYE SURGERY    . HAND SURGERY Right   . LOOP RECORDER IMPLANT    . NISSEN FUNDOPLICATION  8756  . REPLACEMENT TOTAL KNEE Bilateral 2017  . TOTAL HIP ARTHROPLASTY Right   . TOTAL VAGINAL HYSTERECTOMY       reports that she has never smoked. She has never used smokeless tobacco. She reports previous alcohol use. She reports that she does not use drugs.  Allergies  Allergen Reactions  . Lidocaine Rash    Family History  Problem Relation Age of Onset  . Heart Problems Mother   . Heart attack Mother   . Heart Problems Father   . Heart attack Brother   . Heart Problems Brother   . Colon cancer Neg Hx   .  Breast cancer Neg Hx       Prior to Admission medications   Medication Sig Start Date End Date Taking? Authorizing Provider  albuterol (VENTOLIN HFA) 108 (90 Base) MCG/ACT inhaler Inhale 2 puffs into the lungs every 4 (four) hours as needed for wheezing or shortness of breath. 01/04/21  Yes Bacigalupo, Dionne Bucy, MD  amLODipine (NORVASC) 10 MG tablet Take 1 tablet (10 mg total) by mouth daily. 01/04/21 04/04/21 Yes Bacigalupo, Dionne Bucy, MD  aspirin EC 81 MG tablet Take 81 mg by mouth daily.   Yes [provider]  carvedilol (COREG) 12.5 MG tablet Take 1 tablet (12.5 mg total) by mouth 2 (two) times  daily. 01/04/21 07/03/21 Yes Bacigalupo, Dionne Bucy, MD  cetirizine (ZYRTEC) 10 MG tablet Take 1 tablet (10 mg total) by mouth daily. 01/04/21  Yes Bacigalupo, Dionne Bucy, MD  chlorthalidone (HYGROTON) 25 MG tablet Take 1 tablet (25 mg) by mouth once daily in the morning 01/04/21  Yes Bacigalupo, Dionne Bucy, MD  clopidogrel (PLAVIX) 75 MG tablet Take 1 tablet (75 mg total) by mouth daily. 01/04/21  Yes Bacigalupo, Dionne Bucy, MD  enalapril (VASOTEC) 10 MG tablet Take 1 tablet (10 mg total) by mouth 2 (two) times daily. 01/04/21  Yes Bacigalupo, Dionne Bucy, MD  Fluticasone-Salmeterol (ADVAIR) 500-50 MCG/DOSE AEPB Inhale 1 puff into the lungs 2 (two) times daily. 01/04/21  Yes Bacigalupo, Dionne Bucy, MD  levothyroxine (SYNTHROID) 88 MCG tablet Take 1 tablet (88 mcg total) by mouth daily before breakfast. 01/04/21  Yes Bacigalupo, Dionne Bucy, MD  montelukast (SINGULAIR) 10 MG tablet Take 1 tablet (10 mg total) by mouth at bedtime. 01/04/21  Yes Bacigalupo, Dionne Bucy, MD  omeprazole (PRILOSEC) 40 MG capsule Take 1 capsule (40 mg total) by mouth 2 (two) times daily. 01/04/21  Yes Bacigalupo, Dionne Bucy, MD  potassium chloride (KLOR-CON) 10 MEQ tablet Take 2 tablets (20 mEq total) by mouth 2 (two) times daily. 01/04/21  Yes Bacigalupo, Dionne Bucy, MD  rosuvastatin (CRESTOR) 20 MG tablet Take 1 tablet (20 mg total) by mouth daily. 01/04/21 04/04/21 Yes Bacigalupo, Dionne Bucy, MD  Turmeric 500 MG TABS Take 1,000 mg by mouth daily.   Yes [provider]    Physical Exam: Vitals:   02/02/21 0303 02/02/21 0305 02/02/21 0531 02/02/21 0930  BP: (!) 154/77  (!) 149/69 116/65  Pulse: 60  (!) 57 64  Resp: 18  16 20   Temp: 97.7 F (36.5 C)     TempSrc: Oral     SpO2: 99%  98% 100%  Weight:  63 kg    Height:  5\' 2"  (1.575 m)       Vitals:   02/02/21 0303 02/02/21 0305 02/02/21 0531 02/02/21 0930  BP: (!) 154/77  (!) 149/69 116/65  Pulse: 60  (!) 57 64  Resp: 18  16 20   Temp: 97.7 F (36.5 C)     TempSrc: Oral     SpO2: 99%  98% 100%   Weight:  63 kg    Height:  5\' 2"  (1.575 m)        Constitutional: Alert and oriented x 3 . Not in any apparent distress HEENT:      Head: Normocephalic and atraumatic.         Eyes: PERLA, EOMI, Conjunctivae are normal. Sclera is non-icteric.       Mouth/Throat: Mucous membranes are moist.       Neck: Supple with no signs of meningismus. Cardiovascular: Regular rate and rhythm.  No murmurs, gallops, or rubs. 2+ symmetrical distal pulses are present . No JVD. No LE edema Respiratory: Respiratory effort normal .Lungs sounds clear bilaterally. No wheezes, crackles, or rhonchi.  Gastrointestinal: Soft, non tender, and non distended with positive bowel sounds.  Genitourinary: No CVA tenderness. Musculoskeletal: Nontender with normal range of motion in all extremities. No cyanosis, or erythema of extremities. Neurologic:  Face is symmetric. Moving all extremities. No gross focal neurologic deficits  Skin: Skin is warm, dry.  No rash or ulcers Psychiatric: Mood and affect are normal   Labs on Admission: I have personally reviewed following labs and imaging studies  CBC: Recent Labs  Lab 02/02/21 0308  WBC 5.7  NEUTROABS 3.2  HGB 8.9*  HCT 27.5*  MCV 93.5  PLT 527   Basic Metabolic Panel: Recent Labs  Lab 02/02/21 0308  NA 136  K 4.5  CL 109  CO2 19*  GLUCOSE 137*  BUN 28*  CREATININE 1.27*  CALCIUM 8.8*   GFR: Estimated Creatinine Clearance: 29.8 mL/min (A) (by C-G formula based on SCr of 1.27 mg/dL (H)). Liver Function Tests: Recent Labs  Lab 02/02/21 0308  AST 16  ALT 11  ALKPHOS 60  BILITOT 0.5  PROT 6.5  ALBUMIN 3.7   Recent Labs  Lab 02/02/21 0308  LIPASE 30   No results for input(s): AMMONIA in the last 168 hours. Coagulation Profile: No results for input(s): INR, PROTIME in the last 168 hours. Cardiac Enzymes: No results for input(s): CKTOTAL, CKMB, CKMBINDEX, TROPONINI in the last 168 hours. BNP (last 3 results) No results for input(s): PROBNP  in the last 8760 hours. HbA1C: No results for input(s): HGBA1C in the last 72 hours. CBG: No results for input(s): GLUCAP in the last 168 hours. Lipid Profile: No results for input(s): CHOL, HDL, LDLCALC, TRIG, CHOLHDL, LDLDIRECT in the last 72 hours. Thyroid Function Tests: No results for input(s): TSH, T4TOTAL, FREET4, T3FREE, THYROIDAB in the last 72 hours. Anemia Panel: No results for input(s): VITAMINB12, FOLATE, FERRITIN, TIBC, IRON, RETICCTPCT in the last 72 hours. Urine analysis:    Component Value Date/Time   COLORURINE YELLOW (A) 02/02/2021 0830   APPEARANCEUR CLOUDY (A) 02/02/2021 0830   LABSPEC 1.011 02/02/2021 0830   PHURINE 5.0 02/02/2021 0830   GLUCOSEU NEGATIVE 02/02/2021 0830   HGBUR LARGE (A) 02/02/2021 0830   BILIRUBINUR NEGATIVE 02/02/2021 0830   KETONESUR NEGATIVE 02/02/2021 0830   PROTEINUR NEGATIVE 02/02/2021 0830   NITRITE NEGATIVE 02/02/2021 0830   LEUKOCYTESUR LARGE (A) 02/02/2021 0830    Radiological Exams on Admission: DG Chest Port 1 View  Result Date: 02/02/2021 CLINICAL DATA:  GI bleed. EXAM: PORTABLE CHEST 1 VIEW COMPARISON:  No prior. FINDINGS: Mild prominence of the right upper mediastinum, most likely prominent great vessels. Hilar structures normal. Cardiac monitor device noted. Cardiomegaly. No pulmonary venous congestion. Prominent epicardial fat pad. Mild bibasilar atelectasis. No pleural effusion or pneumothorax. Degenerative changes thoracic spine. IMPRESSION: 1. Cardiomegaly. No pulmonary venous congestion. 2.  Mild bibasilar atelectasis. Electronically Signed   By: [place]   On: 02/02/2021 06:01     Assessment/Plan Principal Problem:   Acute blood loss anemia Active Problems:   Hypothyroidism   Essential hypertension   CAD (coronary artery disease)   CKD (chronic kidney disease), stage III (HCC)   GI bleeding     Acute blood loss anemia From gastrointestinal sources Patient presents with multiple episodes of painless  bright red blood per rectum Patient has a baseline hemoglobin of 11g/dl and  on admission today it is 8.9g/dl Obtain serial H&H and transfuse as needed Hold aspirin and Plavix We will request GI consult   Hypothyroidism Continue Synthroid   Essential hypertension with complications of stage III chronic kidney disease Continue carvedilol and amlodipine Renal function is stable   Coronary artery disease Hold aspirin and Plavix due to acute blood loss anemia Continue statins   DVT prophylaxis: SCD Code Status: full code Family Communication: Greater than 50% of time was spent discussing patient's condition and plan of care with her and her daughter at the bedside.  All questions and concerns have been addressed.  They verbalized understanding and agree with the plan. Disposition Plan: Back to previous home environment Consults called: Gastroenterology Status: Inpatient.  The medical decision making for this patient was of high complexity and patient is at high risk for clinical deterioration during this hospitalization.    Collier Bullock MD [facility]     02/02/2021, 10:43 AM

## 2021-02-02 NOTE — ED Notes (Signed)
Attempted to call pt's daughter at this time at pt's request, call forwarded to voicemail

## 2021-02-02 NOTE — Consult Note (Signed)
GI Inpatient Consult Note  Reason for Consult: Painless Hematochezia    Attending Requesting Consult: Dr. Eugenie Norrie, MD  History of Present Illness: Andrea Hart is a 85 y.o. female seen for evaluation of lower GI bleeding 2/2 painless hematochezia at the request of Dr. Eugenie Norrie. Pt has a PMH of HTN, HLD, hypothyroidism, CAD s/p PCI and stent placement 2013, Hx of CVA, chronic antiplatelet therapy with aspirin and Plavix, and hx of asthma. Pt presented to the ED early this morning for chief complaint of painless hematochezia. She reports she went to bed last night around midnight in her usual state of health and feeling fine. She was awoken at 12:30AM with the urge to have a BM and noticed she just passed bright red blood with clots. This occurred a total of 3 times and was bright red with clots each time. She let her daughter know and immediately went to the ED. Upon presentation to the ED, she had relatively stable vital signs. Blood work showed a decrease in her hemoglobin from 11 to 8.9. Patient does take both baby aspirin and Plavix for chronic antiplatelet therapy for hx of CAD s/p stenting and Hx of CVA. She has been stable overnight with no further bleeding episodes. She denies any history of GI bleeding. She denies any abdominal pain, cramping, changes in bowel habits, diarrhea, constipation, or rectal pain. She denies any recent medication changes in changes to her overall health. She denies any personal hx of colon polyps. She reports last colonoscopy was performed >3 years ago in [place], [place] and doesn't remember anything being abnormal. She denies any known family history of colorectal adenocarcinoma or advanced adenomas. She denies any loss of appetite or unintentional weight loss. She denies any frequent use of NSAIDs other than baby aspirin. She feels fine today with no other specific complaints.    Past Medical History:  Past Medical History:  Diagnosis Date  . Arthritis   . Asthma    . Coronary artery disease    a. 2013 s/p PCI and stent placement in [facility] done for stable angina.  . Hyperlipidemia   . Hypertension   . Hypothyroidism   . Stroke [facility])    a. 2017 - ILR did not show afib.    Problem List: Patient Active Problem List   Diagnosis Date Noted  . GI bleeding 02/02/2021  . Acute blood loss anemia 02/02/2021  . Unsteady gait when walking 08/13/2020  . Vitamin D deficiency 08/13/2020  . CAD (coronary artery disease) 12/29/2019  . CKD (chronic kidney disease), stage III ([place]) 12/29/2019  . Post-menopausal 07/29/2018  . H/O coronary angioplasty 07/29/2018  . Osteoarthritis 07/29/2018  . Post-nasal drip 07/29/2018  . Gastroesophageal reflux disease without esophagitis 07/29/2018  . Hypothyroidism 05/20/2018  . Anemia 05/20/2018  . Essential hypertension 05/20/2018  . Mild intermittent asthma 05/20/2018  . Hyperlipidemia 05/20/2018  . Dry eye syndrome, bilateral 08/09/2017  . Pseudophakia, both eyes 06/29/2017  . PVD (posterior vitreous detachment), right 06/29/2017  . Macular retinal edema 06/26/2017    Past Surgical History: Past Surgical History:  Procedure Laterality Date  . APPENDECTOMY    . CATARACT EXTRACTION Bilateral   . CHOLECYSTECTOMY    . CORONARY ANGIOPLASTY  2013   1xStent [facility] [place].  Marland Kitchen EYE SURGERY    . HAND SURGERY Right   . LOOP RECORDER IMPLANT    . NISSEN FUNDOPLICATION  7169  . REPLACEMENT TOTAL KNEE Bilateral 2017  . TOTAL HIP ARTHROPLASTY Right   .  TOTAL VAGINAL HYSTERECTOMY      Allergies: Allergies  Allergen Reactions  . Lidocaine Rash    Home Medications: Medications Prior to Admission  Medication Sig Dispense Refill Last Dose  . albuterol (VENTOLIN HFA) 108 (90 Base) MCG/ACT inhaler Inhale 2 puffs into the lungs every 4 (four) hours as needed for wheezing or shortness of breath. 6.7 g 1 prn at prn  . amLODipine (NORVASC) 10 MG tablet Take 1 tablet (10 mg total) by mouth daily. 90  tablet 3 Unknown at Unknown  . aspirin EC 81 MG tablet Take 81 mg by mouth daily.   02/01/2021 at 2130  . carvedilol (COREG) 12.5 MG tablet Take 1 tablet (12.5 mg total) by mouth 2 (two) times daily. 180 tablet 1 02/01/2021 at 2130  . cetirizine (ZYRTEC) 10 MG tablet Take 1 tablet (10 mg total) by mouth daily. 90 tablet 3 02/01/2021 at 2130  . chlorthalidone (HYGROTON) 25 MG tablet Take 1 tablet (25 mg) by mouth once daily in the morning 90 tablet 1 02/01/2021 at 0930  . clopidogrel (PLAVIX) 75 MG tablet Take 1 tablet (75 mg total) by mouth daily. 90 tablet 1 02/01/2021 at 0930  . enalapril (VASOTEC) 10 MG tablet Take 1 tablet (10 mg total) by mouth 2 (two) times daily. 180 tablet 1 02/01/2021 at 2130  . Fluticasone-Salmeterol (ADVAIR) 500-50 MCG/DOSE AEPB Inhale 1 puff into the lungs 2 (two) times daily. 180 each 3 prn at prn  . levothyroxine (SYNTHROID) 88 MCG tablet Take 1 tablet (88 mcg total) by mouth daily before breakfast. 90 tablet 1 02/01/2021 at 0900  . montelukast (SINGULAIR) 10 MG tablet Take 1 tablet (10 mg total) by mouth at bedtime. 90 tablet 3 02/01/2021 at 2130  . omeprazole (PRILOSEC) 40 MG capsule Take 1 capsule (40 mg total) by mouth 2 (two) times daily. 180 capsule 3 02/01/2021 at 2130  . potassium chloride (KLOR-CON) 10 MEQ tablet Take 2 tablets (20 mEq total) by mouth 2 (two) times daily. 360 tablet 3 02/01/2021 at 2130  . rosuvastatin (CRESTOR) 20 MG tablet Take 1 tablet (20 mg total) by mouth daily. 90 tablet 1 02/01/2021 at 0900  . Turmeric 500 MG TABS Take 1,000 mg by mouth daily.   02/01/2021 at [place] medication reconciliation was completed with the patient.   Scheduled Inpatient Medications:   . amLODipine  10 mg Oral Daily  . carvedilol  12.5 mg Oral BID  . [START ON 02/03/2021] levothyroxine  88 mcg Oral QAC breakfast  . loratadine  10 mg Oral Daily  . mometasone-formoterol  2 puff Inhalation BID  . montelukast  10 mg Oral QHS  . pantoprazole  40 mg Oral Daily  . rosuvastatin  20  mg Oral Daily    Continuous Inpatient Infusions:   . sodium chloride 100 mL/hr at 02/02/21 1306    PRN Inpatient Medications:  acetaminophen **OR** acetaminophen, albuterol, ondansetron **OR** ondansetron (ZOFRAN) IV  Family History: family history includes Heart Problems in her brother, father, and mother; Heart attack in her brother and mother.  The patient's family history is negative for inflammatory bowel disorders, GI malignancy, or solid organ transplantation.  Social History:   reports that she has never smoked. She has never used smokeless tobacco. She reports previous alcohol use. She reports that she does not use drugs. The patient denies ETOH, tobacco, or drug use.   Review of Systems: Constitutional: Weight is stable.  Eyes: No changes in vision. ENT: No oral lesions, sore  throat.  GI: see HPI.  Heme/Lymph: No easy bruising.  CV: No chest pain.  GU: No hematuria.  Integumentary: No rashes.  Neuro: No headaches.  Psych: No depression/anxiety.  Endocrine: No heat/cold intolerance.  Allergic/Immunologic: No urticaria.  Resp: No cough, SOB.  Musculoskeletal: No joint swelling.    Physical Examination: BP (!) 138/54 (BP Location: Left Arm)   Pulse (!) 59   Temp 98.2 F (36.8 C)   Resp 18   Ht 5\' 2"  (1.575 m)   Wt 63 kg   SpO2 99%   BMI 25.42 kg/m  Gen: NAD, alert and oriented x 4 HEENT: PEERLA, EOMI, Neck: supple, no JVD or thyromegaly Chest: CTA bilaterally, no wheezes, crackles, or other adventitious sounds CV: RRR, no m/g/c/r Abd: soft, NT, ND, +BS in all four quadrants; no HSM, guarding, ridigity, or rebound tenderness Ext: no edema, well perfused with 2+ pulses, Skin: no rash or lesions noted Lymph: no LAD  Data: Lab Results  Component Value Date   WBC 5.7 02/02/2021   HGB 8.6 (L) 02/02/2021   HCT 26.3 (L) 02/02/2021   MCV 93.5 02/02/2021   PLT 275 02/02/2021   Recent Labs  Lab 02/02/21 0308 02/02/21 1310  HGB 8.9* 8.6*   Lab Results   Component Value Date   NA 136 02/02/2021   K 4.5 02/02/2021   CL 109 02/02/2021   CO2 19 (L) 02/02/2021   BUN 28 (H) 02/02/2021   CREATININE 1.27 (H) 02/02/2021   Lab Results  Component Value Date   ALT 11 02/02/2021   AST 16 02/02/2021   ALKPHOS 60 02/02/2021   BILITOT 0.5 02/02/2021   No results for input(s): APTT, INR, PTT in the last 168 hours. Assessment/Plan:  85 y/o Caucasian female with a PMH of HTN, HLD, hypothyroidism, CAD s/p PCI and stent placement 2013, Hx of CVA, chronic antiplatelet therapy with aspirin and Plavix, and hx of asthma presented to the [facility] ED this morning for chief complaint of three episodes of painless hematochezia   1. Acute LGI bleeding/Painless hematochezia -Clinical presentation most consistent with acute diverticular bleeding. Differential also includes anal outlet etiology from internal hemorrhoids, proctitis, stercoral changes, mass lesion, IBD, ischemic colitis, other -2-gram drop in hemoglobin from baseline -H&H stable currently. No indication for transfusion. -No further episodes of hematochezia since admission  2. Acute post hemorrhagic anemia  3. Chronic antiplatelet therapy - last dose Plavix yesterday  COVID-19 Test: NEGATIVE  Recommendations:  - H&H stable currently. Repeat Hgb 8.6. Continue to monitor H&H closely and transfuse for hemoglobin <7.0. - No further episodes of hematochezia since admission - Patient likely suffered an acute, self-limited diverticular bleed - Hold Plavix and aspirin. No NSAIDs. - Attempt to obtain previous colonoscopy procedure/path reports - Consider colonoscopy when clinically feasible, ideally after Plavix has been held for at least 48 hours - If evidence of active GI bleeding, would recommend ordering a tagged RBC scan - Following along with you   Thank you for the consult. Please call with questions or concerns.  Reeves Forth [facility]  Gastroenterology [phone] (Cell)

## 2021-02-02 NOTE — ED Notes (Signed)
X-ray at bedside

## 2021-02-02 NOTE — Progress Notes (Signed)
This RN delivered [facility] paperwork to the patient. The patient completely educated. All outstanding questions resolved. All physicians involved in care aware of patients leaving AMA. Patient signed [place] paperwork. Placed in chart. L arm PIV removed. Cannula intact. Pt tolerated well. All belongings packed and in tow. Daughter to retrieve patient to discharge to private residence.

## 2021-02-02 NOTE — ED Triage Notes (Signed)
Patient ambulatory to triage with steady gait, without difficulty or distress noted; pt reports 3 episodes of rectal bleeding tonight; denies any c/o, denies any hx of same; st she does have hemorrhoids

## 2021-02-02 NOTE — Plan of Care (Signed)
  Problem: Education: Goal: Knowledge of General Education information will improve Description: Including pain rating scale, medication(s)/side effects and non-pharmacologic comfort measures 02/02/2021 1327 by Cristela Blue, RN Outcome: Progressing 02/02/2021 1327 by Cristela Blue, RN Outcome: Progressing   Problem: Health Behavior/Discharge Planning: Goal: Ability to manage health-related needs will improve 02/02/2021 1327 by Cristela Blue, RN Outcome: Progressing 02/02/2021 1327 by Cristela Blue, RN Outcome: Progressing   Problem: Clinical Measurements: Goal: Ability to maintain clinical measurements within normal limits will improve 02/02/2021 1327 by Cristela Blue, RN Outcome: Progressing 02/02/2021 1327 by Cristela Blue, RN Outcome: Progressing Goal: Will remain free from infection 02/02/2021 1327 by Cristela Blue, RN Outcome: Progressing 02/02/2021 1327 by Cristela Blue, RN Outcome: Progressing Goal: Diagnostic test results will improve 02/02/2021 1327 by Cristela Blue, RN Outcome: Progressing 02/02/2021 1327 by Cristela Blue, RN Outcome: Progressing Goal: Respiratory complications will improve 02/02/2021 1327 by Cristela Blue, RN Outcome: Progressing 02/02/2021 1327 by Cristela Blue, RN Outcome: Progressing Goal: Cardiovascular complication will be avoided 02/02/2021 1327 by Cristela Blue, RN Outcome: Progressing 02/02/2021 1327 by Cristela Blue, RN Outcome: Progressing   Problem: Activity: Goal: Risk for activity intolerance will decrease 02/02/2021 1327 by Cristela Blue, RN Outcome: Progressing 02/02/2021 1327 by Cristela Blue, RN Outcome: Progressing   Problem: Nutrition: Goal: Adequate nutrition will be maintained 02/02/2021 1327 by Cristela Blue, RN Outcome: Progressing 02/02/2021 1327 by Cristela Blue, RN Outcome: Progressing   Problem: Coping: Goal: Level of anxiety will decrease 02/02/2021 1327 by Cristela Blue, RN Outcome: Progressing 02/02/2021 1327 by  Cristela Blue, RN Outcome: Progressing   Problem: Elimination: Goal: Will not experience complications related to bowel motility 02/02/2021 1327 by Cristela Blue, RN Outcome: Progressing 02/02/2021 1327 by Cristela Blue, RN Outcome: Progressing Goal: Will not experience complications related to urinary retention 02/02/2021 1327 by Cristela Blue, RN Outcome: Progressing 02/02/2021 1327 by Cristela Blue, RN Outcome: Progressing   Problem: Pain Managment: Goal: General experience of comfort will improve 02/02/2021 1327 by Cristela Blue, RN Outcome: Progressing 02/02/2021 1327 by Cristela Blue, RN Outcome: Progressing   Problem: Safety: Goal: Ability to remain free from injury will improve 02/02/2021 1327 by Cristela Blue, RN Outcome: Progressing 02/02/2021 1327 by Cristela Blue, RN Outcome: Progressing   Problem: Skin Integrity: Goal: Risk for impaired skin integrity will decrease 02/02/2021 1327 by Cristela Blue, RN Outcome: Progressing 02/02/2021 1327 by Cristela Blue, RN Outcome: Progressing

## 2021-02-02 NOTE — ED Provider Notes (Signed)
[facility] Emergency Department Provider Note   ____________________________________________   Event Date/Time   First MD Initiated Contact with Patient 02/02/21 [phone]     (approximate)  I have reviewed the triage vital signs and the nursing notes.   HISTORY  Chief Complaint Rectal Bleeding    HPI Andrea Hart is a 85 y.o. female who presents to the ED from home with a chief complaint of rectal bleeding.  Patient awoke tonight and had 3 episodes of rectal bleeding with dark blood and clots.  Denies history of same.  History of hemorrhoids and takes Plavix.  Denies associated abdominal pain, nausea, vomiting, diarrhea or dizziness.  Denies recent fever, cough, chest pain or shortness of breath.  Patient is vaccinated and boosted against COVID-19.  Last colonoscopy approximately 2 to 3 years ago which patient reports was unremarkable.     Past Medical History:  Diagnosis Date  . Arthritis   . Asthma   . Coronary artery disease    a. 2013 s/p PCI and stent placement in [facility] done for stable angina.  . Hyperlipidemia   . Hypertension   . Hypothyroidism   . Stroke [facility])    a. 2017 - ILR did not show afib.    Patient Active Problem List   Diagnosis Date Noted  . GI bleeding 02/02/2021  . Unsteady gait when walking 08/13/2020  . Vitamin D deficiency 08/13/2020  . CAD (coronary artery disease) 12/29/2019  . CKD (chronic kidney disease) 12/29/2019  . Post-menopausal 07/29/2018  . H/O coronary angioplasty 07/29/2018  . Osteoarthritis 07/29/2018  . Post-nasal drip 07/29/2018  . Gastroesophageal reflux disease without esophagitis 07/29/2018  . Hypothyroidism 05/20/2018  . Anemia 05/20/2018  . Essential hypertension 05/20/2018  . Mild intermittent asthma 05/20/2018  . Hyperlipidemia 05/20/2018  . Dry eye syndrome, bilateral 08/09/2017  . Pseudophakia, both eyes 06/29/2017  . PVD (posterior vitreous detachment), right 06/29/2017   . Macular retinal edema 06/26/2017    Past Surgical History:  Procedure Laterality Date  . APPENDECTOMY    . CATARACT EXTRACTION Bilateral   . CHOLECYSTECTOMY    . CORONARY ANGIOPLASTY  2013   1xStent [facility] [place].  Marland Kitchen EYE SURGERY    . HAND SURGERY Right   . LOOP RECORDER IMPLANT    . NISSEN FUNDOPLICATION  8295  . REPLACEMENT TOTAL KNEE Bilateral 2017  . TOTAL HIP ARTHROPLASTY Right   . TOTAL VAGINAL HYSTERECTOMY      Prior to Admission medications   Medication Sig Start Date End Date Taking? Authorizing Provider  albuterol (VENTOLIN HFA) 108 (90 Base) MCG/ACT inhaler Inhale 2 puffs into the lungs every 4 (four) hours as needed for wheezing or shortness of breath. 01/04/21   Virginia Crews, MD  amLODipine (NORVASC) 10 MG tablet Take 1 tablet (10 mg total) by mouth daily. 01/04/21 04/04/21  Virginia Crews, MD  aspirin EC 81 MG tablet Take 81 mg by mouth daily.    [provider]  carvedilol (COREG) 12.5 MG tablet Take 1 tablet (12.5 mg total) by mouth 2 (two) times daily. 01/04/21 07/03/21  Virginia Crews, MD  cetirizine (ZYRTEC) 10 MG tablet Take 1 tablet (10 mg total) by mouth daily. 01/04/21   Virginia Crews, MD  chlorthalidone (HYGROTON) 25 MG tablet Take 1 tablet (25 mg) by mouth once daily in the morning 01/04/21   Virginia Crews, MD  clopidogrel (PLAVIX) 75 MG tablet Take 1 tablet (75 mg total) by mouth daily.  01/04/21   Virginia Crews, MD  enalapril (VASOTEC) 10 MG tablet Take 1 tablet (10 mg total) by mouth 2 (two) times daily. 01/04/21   Virginia Crews, MD  Fluticasone-Salmeterol (ADVAIR) 500-50 MCG/DOSE AEPB Inhale 1 puff into the lungs 2 (two) times daily. 01/04/21   Virginia Crews, MD  levothyroxine (SYNTHROID) 88 MCG tablet Take 1 tablet (88 mcg total) by mouth daily before breakfast. 01/04/21   Bacigalupo, Dionne Bucy, MD  montelukast (SINGULAIR) 10 MG tablet Take 1 tablet (10 mg total) by mouth at bedtime. 01/04/21   Virginia Crews, MD  omeprazole (PRILOSEC) 40 MG capsule Take 1 capsule (40 mg total) by mouth 2 (two) times daily. 01/04/21   Bacigalupo, Dionne Bucy, MD  potassium chloride (KLOR-CON) 10 MEQ tablet Take 2 tablets (20 mEq total) by mouth 2 (two) times daily. 01/04/21   Virginia Crews, MD  rosuvastatin (CRESTOR) 20 MG tablet Take 1 tablet (20 mg total) by mouth daily. 01/04/21 04/04/21  Virginia Crews, MD  Turmeric 500 MG TABS Take 1,000 mg by mouth daily.    [provider]    Allergies Lidocaine  Family History  Problem Relation Age of Onset  . Heart Problems Mother   . Heart attack Mother   . Heart Problems Father   . Heart attack Brother   . Heart Problems Brother   . Colon cancer Neg Hx   . Breast cancer Neg Hx     Social History Social History   Tobacco Use  . Smoking status: Never Smoker  . Smokeless tobacco: Never Used  Vaping Use  . Vaping Use: Never used  Substance Use Topics  . Alcohol use: Not Currently    Comment: Occasional  . Drug use: Never    Review of Systems  Constitutional: No fever/chills Eyes: No visual changes. ENT: No sore throat. Cardiovascular: Denies chest pain. Respiratory: Denies shortness of breath. Gastrointestinal: Positive for bloody stools.  No abdominal pain.  No nausea, no vomiting.  No diarrhea.  No constipation. Genitourinary: Negative for dysuria. Musculoskeletal: Negative for back pain. Skin: Negative for rash. Neurological: Negative for headaches, focal weakness or numbness.   ____________________________________________   PHYSICAL EXAM:  VITAL SIGNS: ED Triage Vitals  Enc Vitals Group     BP 02/02/21 0303 (!) 154/77     Pulse Rate 02/02/21 0303 60     Resp 02/02/21 0303 18     Temp 02/02/21 0303 97.7 F (36.5 C)     Temp Source 02/02/21 0303 Oral     SpO2 02/02/21 0303 99 %     Weight 02/02/21 0305 139 lb (63 kg)     Height 02/02/21 0305 5\' 2"  (1.575 m)     Head Circumference --      Peak Flow --       Pain Score 02/02/21 0304 0     Pain Loc --      Pain Edu? --      Excl. in [place]? --     Constitutional: Alert and oriented.  Elderly appearing and in no acute distress. Eyes: Conjunctivae are normal. PERRL. EOMI. Head: Atraumatic. Nose: No congestion/rhinnorhea. Mouth/Throat: Mucous membranes are mildly dry. Neck: No stridor.   Cardiovascular: Normal rate, regular rhythm. Grossly normal heart sounds.  Good peripheral circulation. Respiratory: Normal respiratory effort.  No retractions. Lungs CTAB. Gastrointestinal: Soft and nontender to light or deep palpation. No distention. No abdominal bruits. No CVA tenderness. Rectal: External hemorrhoid.  Gross hematochezia. Musculoskeletal: No  lower extremity tenderness nor edema.  No joint effusions. Neurologic:  Normal speech and language. No gross focal neurologic deficits are appreciated. No gait instability. Skin:  Skin is warm, dry and intact. No rash noted. Psychiatric: Mood and affect are normal. Speech and behavior are normal.  ____________________________________________   LABS (all labs ordered are listed, but only abnormal results are displayed)  Labs Reviewed  CBC WITH DIFFERENTIAL/PLATELET - Abnormal; Notable for the following components:      Result Value   RBC 2.94 (*)    Hemoglobin 8.9 (*)    HCT 27.5 (*)    All other components within normal limits  COMPREHENSIVE METABOLIC PANEL - Abnormal; Notable for the following components:   CO2 19 (*)    Glucose, Bld 137 (*)    BUN 28 (*)    Creatinine, Ser 1.27 (*)    Calcium 8.8 (*)    GFR, Estimated 42 (*)    All other components within normal limits  RESP PANEL BY RT-PCR (FLU A&B, COVID) ARPGX2  LIPASE, BLOOD  URINALYSIS, COMPLETE (UACMP) WITH MICROSCOPIC  TYPE AND SCREEN  TROPONIN I (HIGH SENSITIVITY)   ____________________________________________  EKG  ED ECG REPORT I, Jakell Trusty J, the attending physician, personally viewed and interpreted this ECG.   Date:  02/02/2021  EKG Time: 0550  Rate: 56  Rhythm: sinus bradycardia  Axis: Normal  Intervals:none  ST&T Change: Nonspecific  ____________________________________________  RADIOLOGY I, Syd Newsome J, personally viewed and evaluated these images (plain radiographs) as part of my medical decision making, as well as reviewing the written report by the radiologist.  ED MD interpretation: Cardiomegaly  Official radiology report(s): DG Chest Port 1 View  Result Date: 02/02/2021 CLINICAL DATA:  GI bleed. EXAM: PORTABLE CHEST 1 VIEW COMPARISON:  No prior. FINDINGS: Mild prominence of the right upper mediastinum, most likely prominent great vessels. Hilar structures normal. Cardiac monitor device noted. Cardiomegaly. No pulmonary venous congestion. Prominent epicardial fat pad. Mild bibasilar atelectasis. No pleural effusion or pneumothorax. Degenerative changes thoracic spine. IMPRESSION: 1. Cardiomegaly. No pulmonary venous congestion. 2.  Mild bibasilar atelectasis. Electronically Signed   By: Marcello Moores  Register   On: 02/02/2021 06:01    ____________________________________________   PROCEDURES  Procedure(s) performed (including Critical Care):  .1-3 Lead EKG Interpretation Performed by: Paulette Blanch, MD Authorized by: Paulette Blanch, MD     Interpretation: normal     ECG rate:  60   ECG rate assessment: normal     Rhythm: sinus rhythm     Ectopy: none     Conduction: normal   Comments:     Patient placed on cardiac monitor to evaluate for arrhythmias     ____________________________________________   INITIAL IMPRESSION / ASSESSMENT AND PLAN / ED COURSE  As part of my medical decision making, I reviewed the following data within the [place] notes reviewed and incorporated, Labs reviewed, EKG interpreted, Old chart reviewed, Radiograph reviewed, Discussed with admitting physician and Notes from prior ED visits     85 year old female presenting with rectal  bleeding on Plavix. Differential diagnosis includes, but is not limited to diverticulitis, bowel obstruction, colitis, etc.  Laboratory results remarkable for drop in H/H since 4 weeks ago - 11/31.6 to 8.9/27.5 and mild elevation in BUN/creatinine.  Type and screen obtained.  Will administer IV fluids, keep patient n.p.o.  And discuss with hospitalist services for admission.     ____________________________________________   FINAL CLINICAL IMPRESSION(S) / ED DIAGNOSES  Final diagnoses:  Rectal bleeding  Hematochezia     ED Discharge Orders    None      *Please note:  Braylon Lemmons was evaluated in Emergency Department on 02/02/2021 for the symptoms described in the history of present illness. She was evaluated in the context of the global COVID-19 pandemic, which necessitated consideration that the patient might be at risk for infection with the SARS-CoV-2 virus that causes COVID-19. Institutional protocols and algorithms that pertain to the evaluation of patients at risk for COVID-19 are in a state of rapid change based on information released by regulatory bodies including the CDC and federal and state organizations. These policies and algorithms were followed during the patient's care in the ED.  Some ED evaluations and interventions may be delayed as a result of limited staffing during and the pandemic.*   Note:  This document was prepared using Dragon voice recognition software and may include unintentional dictation errors.   Paulette Blanch, MD 02/02/21 [phone]

## 2021-02-03 LAB — BPAM RBC
Blood Product Expiration Date: 202203182359
Blood Product Expiration Date: 202203222359
Unit Type and Rh: 5100
Unit Type and Rh: 5100

## 2021-02-03 LAB — TYPE AND SCREEN
ABO/RH(D): A POS
Antibody Screen: POSITIVE
Unit division: 0
Unit division: 0

## 2021-02-04 DIAGNOSIS — Z7902 Long term (current) use of antithrombotics/antiplatelets: Secondary | ICD-10-CM | POA: Diagnosis not present

## 2021-02-04 DIAGNOSIS — K5731 Diverticulosis of large intestine without perforation or abscess with bleeding: Secondary | ICD-10-CM | POA: Diagnosis not present

## 2021-02-04 DIAGNOSIS — K921 Melena: Secondary | ICD-10-CM | POA: Diagnosis not present

## 2021-02-04 DIAGNOSIS — K922 Gastrointestinal hemorrhage, unspecified: Secondary | ICD-10-CM | POA: Diagnosis not present

## 2021-02-10 ENCOUNTER — Encounter: Payer: Medicare Other | Admitting: Obstetrics and Gynecology

## 2021-03-01 ENCOUNTER — Other Ambulatory Visit: Payer: Self-pay | Admitting: Cardiovascular Disease

## 2021-03-01 ENCOUNTER — Other Ambulatory Visit: Payer: Self-pay | Admitting: Family Medicine

## 2021-03-01 DIAGNOSIS — E039 Hypothyroidism, unspecified: Secondary | ICD-10-CM

## 2021-03-01 DIAGNOSIS — K219 Gastro-esophageal reflux disease without esophagitis: Secondary | ICD-10-CM

## 2021-03-08 DIAGNOSIS — E538 Deficiency of other specified B group vitamins: Secondary | ICD-10-CM | POA: Diagnosis not present

## 2021-03-08 DIAGNOSIS — R790 Abnormal level of blood mineral: Secondary | ICD-10-CM | POA: Diagnosis not present

## 2021-03-08 DIAGNOSIS — Z79899 Other long term (current) drug therapy: Secondary | ICD-10-CM | POA: Diagnosis not present

## 2021-04-04 ENCOUNTER — Other Ambulatory Visit: Admission: RE | Admit: 2021-04-04 | Payer: Medicare Other | Source: Ambulatory Visit

## 2021-04-05 ENCOUNTER — Encounter: Payer: Self-pay | Admitting: Internal Medicine

## 2021-04-06 ENCOUNTER — Ambulatory Visit: Payer: Medicare Other | Admitting: Certified Registered Nurse Anesthetist

## 2021-04-06 ENCOUNTER — Ambulatory Visit
Admission: RE | Admit: 2021-04-06 | Discharge: 2021-04-06 | Disposition: A | Discharge status: Home / Self Care | Payer: Medicare Other | Source: Ambulatory Visit | Attending: Internal Medicine | Admitting: Internal Medicine

## 2021-04-06 ENCOUNTER — Encounter: Payer: Self-pay | Admitting: Internal Medicine

## 2021-04-06 ENCOUNTER — Encounter
Admission: RE | Disposition: A | Discharge status: Home / Self Care | Payer: Self-pay | Source: Ambulatory Visit | Attending: Internal Medicine

## 2021-04-06 DIAGNOSIS — Z79899 Other long term (current) drug therapy: Secondary | ICD-10-CM | POA: Insufficient documentation

## 2021-04-06 DIAGNOSIS — Z7951 Long term (current) use of inhaled steroids: Secondary | ICD-10-CM | POA: Diagnosis not present

## 2021-04-06 DIAGNOSIS — K5730 Diverticulosis of large intestine without perforation or abscess without bleeding: Secondary | ICD-10-CM | POA: Insufficient documentation

## 2021-04-06 DIAGNOSIS — D62 Acute posthemorrhagic anemia: Secondary | ICD-10-CM | POA: Insufficient documentation

## 2021-04-06 DIAGNOSIS — D123 Benign neoplasm of transverse colon: Secondary | ICD-10-CM | POA: Diagnosis not present

## 2021-04-06 DIAGNOSIS — Z79890 Hormone replacement therapy (postmenopausal): Secondary | ICD-10-CM | POA: Diagnosis not present

## 2021-04-06 DIAGNOSIS — Z7982 Long term (current) use of aspirin: Secondary | ICD-10-CM | POA: Insufficient documentation

## 2021-04-06 DIAGNOSIS — D126 Benign neoplasm of colon, unspecified: Secondary | ICD-10-CM | POA: Diagnosis not present

## 2021-04-06 DIAGNOSIS — Z884 Allergy status to anesthetic agent status: Secondary | ICD-10-CM | POA: Insufficient documentation

## 2021-04-06 DIAGNOSIS — K640 First degree hemorrhoids: Secondary | ICD-10-CM | POA: Diagnosis not present

## 2021-04-06 DIAGNOSIS — K921 Melena: Secondary | ICD-10-CM | POA: Diagnosis not present

## 2021-04-06 DIAGNOSIS — K648 Other hemorrhoids: Secondary | ICD-10-CM | POA: Diagnosis not present

## 2021-04-06 DIAGNOSIS — K635 Polyp of colon: Secondary | ICD-10-CM | POA: Diagnosis not present

## 2021-04-06 DIAGNOSIS — E785 Hyperlipidemia, unspecified: Secondary | ICD-10-CM | POA: Diagnosis not present

## 2021-04-06 HISTORY — DX: Unspecified abdominal hernia without obstruction or gangrene: K46.9

## 2021-04-06 HISTORY — PX: COLONOSCOPY WITH PROPOFOL: SHX5780

## 2021-04-06 LAB — HM COLONOSCOPY

## 2021-04-06 SURGERY — COLONOSCOPY WITH PROPOFOL
Anesthesia: General

## 2021-04-06 MED ORDER — PROPOFOL 500 MG/50ML IV EMUL
INTRAVENOUS | Status: DC | PRN
  Administered 2021-04-06: 150 ug/kg/min via INTRAVENOUS

## 2021-04-06 MED ORDER — SODIUM CHLORIDE 0.9 % IV SOLN: INTRAVENOUS | Status: DC

## 2021-04-06 MED ORDER — PROPOFOL 10 MG/ML IV BOLUS
INTRAVENOUS | Status: DC | PRN
  Administered 2021-04-06: 40 mg via INTRAVENOUS

## 2021-04-06 NOTE — Transfer of Care (Signed)
Immediate Anesthesia Transfer of Care Note  Patient: Andrea Hart  Procedure(s) Performed: COLONOSCOPY WITH PROPOFOL (N/A )  Patient Location: PACU  Anesthesia Type:General  Level of Consciousness: awake and alert   Airway & Oxygen Therapy: Patient Spontanous Breathing  Post-op Assessment: Report given to RN and Post -op Vital signs reviewed and stable  Post vital signs: Reviewed and stable  Last Vitals:  Vitals Value Taken Time  BP 122/92 04/06/21 1147  Temp    Pulse 68 04/06/21 1147  Resp 21 04/06/21 1147  SpO2 98 % 04/06/21 1147  Vitals shown include unvalidated device data.  Last Pain:  Vitals:   04/06/21 1026  TempSrc: Temporal  PainSc: 0-No pain         Complications: No complications documented.

## 2021-04-06 NOTE — Interval H&P Note (Signed)
History and Physical Interval Note:  04/06/2021 10:50 AM  [place]  has presented today for surgery, with the diagnosis of LOWER GI BLEED HEMATOCHEZIA DIVERTICULOSIS LRG INTEST W HEMORRHAGE.  The various methods of treatment have been discussed with the patient and family. After consideration of risks, benefits and other options for treatment, the patient has consented to  Procedure(s): COLONOSCOPY WITH PROPOFOL (N/A) as a surgical intervention.  The patient's history has been reviewed, patient examined, no change in status, stable for surgery.  I have reviewed the patient's chart and labs.  Questions were answered to the patient's satisfaction.     Andrea, Hart

## 2021-04-06 NOTE — Op Note (Signed)
[facility] Gastroenterology Patient Name: Andrea Hart Procedure Date: 04/06/2021 11:18 AM MRN: [id number] Account #: [id number] Date of Birth: 03/16/1936 Admit Type: Outpatient Age: 85 Room: [facility] ENDO ROOM 2 Gender: Female Note Status: Finalized Procedure:             Colonoscopy Indications:           Hematochezia, Acute post hemorrhagic anemia Providers:             Benay Pike. [name] MD, MD Referring MD:          Dionne Bucy. Bacigalupo (Referring MD) Medicines:             Propofol per Anesthesia Complications:         No immediate complications. Procedure:             Pre-Anesthesia Assessment:                        - The risks and benefits of the procedure and the                         sedation options and risks were discussed with the                         patient. All questions were answered and informed                         consent was obtained.                        - Patient identification and proposed procedure were                         verified prior to the procedure by the nurse. The                         procedure was verified in the procedure room.                        - ASA Grade Assessment: III - A patient with severe                         systemic disease.                        - After reviewing the risks and benefits, the patient                         was deemed in satisfactory condition to undergo the                         procedure.                        After obtaining informed consent, the colonoscope was                         passed under direct vision. Throughout the procedure,                         the patient's blood pressure, pulse,  and oxygen                         saturations were monitored continuously. The                         Colonoscope was introduced through the anus and                         advanced to the the cecum, identified by appendiceal                         orifice and ileocecal valve.  The colonoscopy was                         performed without difficulty. The patient tolerated                         the procedure well. The quality of the bowel                         preparation was good. The ileocecal valve, appendiceal                         orifice, and rectum were photographed. Findings:      The perianal and digital rectal examinations were normal. Pertinent       negatives include normal sphincter tone and no palpable rectal lesions.      Non-bleeding internal hemorrhoids were found during retroflexion. The       hemorrhoids were Grade I (internal hemorrhoids that do not prolapse).      Multiple small and large-mouthed diverticula were found in the sigmoid       colon. There was no evidence of diverticular bleeding.      A 5 mm polyp was found in the splenic flexure. The polyp was sessile.       The polyp was removed with a jumbo cold forceps. Resection and retrieval       were complete.      The exam was otherwise without abnormality. Impression:            - Non-bleeding internal hemorrhoids.                        - Moderate diverticulosis in the sigmoid colon. There                         was no evidence of diverticular bleeding.                        - One 5 mm polyp at the splenic flexure, removed with                         a jumbo cold forceps. Resected and retrieved.                        - The examination was otherwise normal. Recommendation:        - Patient has a contact number available for  emergencies. The signs and symptoms of potential                         delayed complications were discussed with the patient.                         Return to normal activities tomorrow. Written                         discharge instructions were provided to the patient.                        - Resume previous diet.                        - Continue present medications.                        - If polyps are benign or  adenomatous without                         dysplasia, I will advise NO further colonoscopy due to                         advanced age and/or severe comorbidity.                        - You do NOT require further colon cancer screening                         measures (Annual stool testing (i.e. hemoccult, FIT,                         cologuard), sigmoidoscopy, colonoscopy or CT                         colonography). You should share this recommendation                         with your Primary Care provider.                        - Return to GI clinic PRN.                        - The findings and recommendations were discussed with                         the patient. Procedure Code(s):     --- Professional ---                        [phone], Colonoscopy, flexible; with biopsy, single or                         multiple Diagnosis Code(s):     --- Professional ---                        K57.30, Diverticulosis of large intestine without  perforation or abscess without bleeding                        D62, Acute posthemorrhagic anemia                        K92.1, Melena (includes Hematochezia)                        K63.5, Polyp of colon                        K64.0, First degree hemorrhoids CPT copyright 2019 American Medical Association. All rights reserved. The codes documented in this report are preliminary and upon coder review may  be revised to meet current compliance requirements. Efrain Sella MD, MD 04/06/2021 11:47:53 AM This report has been signed electronically. Number of Addenda: 0 Note Initiated On: 04/06/2021 11:18 AM Scope Withdrawal Time: 0 hours 6 minutes 3 seconds  Total Procedure Duration: 0 hours 13 minutes 48 seconds  Estimated Blood Loss:  Estimated blood loss: none.      [facility]

## 2021-04-06 NOTE — Anesthesia Procedure Notes (Signed)
Performed by: Demetrius Charity, CRNA Pre-anesthesia Checklist: Patient identified, Emergency Drugs available, Suction available, Patient being monitored and Timeout performed Patient Re-evaluated:Patient Re-evaluated prior to induction Oxygen Delivery Method: Nasal cannula Placement Confirmation: CO2 detector and positive ETCO2

## 2021-04-06 NOTE — Anesthesia Preprocedure Evaluation (Signed)
Anesthesia Evaluation  Patient identified by MRN, date of birth, ID band Patient awake    Reviewed: Allergy & Precautions, H&P , NPO status , Patient's Chart, lab work & pertinent test results, reviewed documented beta blocker date and time   History of Anesthesia Complications Negative for: history of anesthetic complications  Airway Mallampati: III  TM Distance: >3 FB Neck ROM: full    Dental  (+) Dental Advidsory Given, Caps, Teeth Intact, Missing   Pulmonary neg shortness of breath, asthma , neg COPD, neg recent URI,    Pulmonary exam normal breath sounds clear to auscultation       Cardiovascular Exercise Tolerance: Good hypertension, (-) angina+ CAD and + Cardiac Stents  (-) Past MI Normal cardiovascular exam(-) dysrhythmias (-) Valvular Problems/Murmurs Rhythm:regular Rate:Normal     Neuro/Psych neg Seizures CVA, No Residual Symptoms negative psych ROS   GI/Hepatic Neg liver ROS, GERD  ,  Endo/Other  neg diabetesHypothyroidism   Renal/GU CRFRenal disease  negative genitourinary   Musculoskeletal   Abdominal   Peds  Hematology negative hematology ROS (+)   Anesthesia Other Findings Past Medical History: No date: Abdominal hernia No date: Arthritis No date: Asthma No date: Coronary artery disease     Comment:  a. 2013 s/p PCI and stent placement in [place] done for stable angina. No date: Hyperlipidemia No date: Hypertension No date: Hypothyroidism No date: Stroke [facility])     Comment:  a. 2017 - ILR did not show afib.   Reproductive/Obstetrics negative OB ROS                             Anesthesia Physical Anesthesia Plan  ASA: III  Anesthesia Plan: General   Post-op Pain Management:    Induction: Intravenous  PONV Risk Score and Plan: 3 and TIVA and Propofol infusion  Airway Management Planned: Natural Airway and Nasal  Cannula  Additional Equipment:   Intra-op Plan:   Post-operative Plan:   Informed Consent: I have reviewed the patients History and Physical, chart, labs and discussed the procedure including the risks, benefits and alternatives for the proposed anesthesia with the patient or authorized representative who has indicated his/her understanding and acceptance.     Dental Advisory Given  Plan Discussed with: Anesthesiologist, CRNA and Surgeon  Anesthesia Plan Comments:         Anesthesia Quick Evaluation

## 2021-04-06 NOTE — Anesthesia Postprocedure Evaluation (Signed)
Anesthesia Post Note  Patient: Andrea Hart  Procedure(s) Performed: COLONOSCOPY WITH PROPOFOL (N/A )  Patient location during evaluation: Endoscopy Anesthesia Type: General Level of consciousness: awake and alert Pain management: pain level controlled Vital Signs Assessment: post-procedure vital signs reviewed and stable Respiratory status: spontaneous breathing, nonlabored ventilation, respiratory function stable and patient connected to nasal cannula oxygen Cardiovascular status: blood pressure returned to baseline and stable Postop Assessment: no apparent nausea or vomiting Anesthetic complications: no   No complications documented.   Last Vitals:  Vitals:   04/06/21 1200 04/06/21 1205  BP: 94/78 (!) 159/75  Pulse: 65 68  Resp: 16 16  Temp:    SpO2: 98% 98%    Last Pain:  Vitals:   04/06/21 1205  TempSrc:   PainSc: 0-No pain                 Martha Clan

## 2021-04-06 NOTE — H&P (Signed)
Outpatient short stay form Pre-procedure 04/06/2021 10:48 AM Andrea Hart K. Andrea Hart, M.D.  Primary Physician: Andrea Hart, M.D.  Reason for visit:    History of present illness:  Ms. Mill presents to the clinic for chief complaint of [facility] hospital follow-up for painless hematochezia/LGI bleed. She was seen earlier this week as a consult about 48 hours ago for acute painless hematochezia. Her daughter presents to the clinic with her. Patient left AMA because she didn't want to be in the hospital any longer. She was upset because she had urinated in her pad and didn't feel like the nurses were very attentive to her concerns. She initially presented to the ED early Wednesday morning after experiencing three episodes of painless hematochezia in a span of 1.5 hours. She arrived to the ED and was found to have drop in hemoglobin from 11 one month ago to 8.9. She had hemoglobin of 7.4 before leaving AMA. She did not receive any blood products in the hospital. There was no recurrence of hematochezia since her presentation to the hospital. There was discussion in the hospital about potential colonoscopy versus observation, but patient wanted to follow-up as an outpatient instead. She reports since leaving the hospital she feels completely fine. She denies any fatigue, shortness of breath, nausea, lightheadedness, or dizziness. Her last BM was yesterday around noon and was brown with just a few specks of red. She denies any history of GI bleeding. She has continue to hold her Plavix since leaving the hospital. She denies any abdominal pain, diarrhea, constipation, or rectal pain. She does have history of left-sided moderate to severe diverticulosis. Last colonoscopy 4-5 years ago performed for indications of diarrhea/IDA by Dr. 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 She completed budesonide therapy. She is having formed BMs now  with no overt diarrhea or urgency. She denies any changes to her appetite. Weight is stable.        Current Facility-Administered Medications:  .  0.9 %  sodium chloride infusion, , Intravenous, Continuous, [place], Benay Pike, MD, Last Rate: 20 mL/hr at 04/06/21 1042, New Bag at 04/06/21 1042  Medications Prior to Admission  Medication Sig Dispense Refill Last Dose  . aspirin EC 81 MG tablet Take 81 mg by mouth daily.   04/06/2021 at Unknown time  . carvedilol (COREG) 12.5 MG tablet Take 1 tablet (12.5 mg total) by mouth 2 (two) times daily. 180 tablet 1 04/06/2021 at Unknown time  . cetirizine (ZYRTEC) 10 MG tablet Take 1 tablet (10 mg total) by mouth daily. 90 tablet 3 04/06/2021 at Unknown time  . chlorthalidone (HYGROTON) 25 MG tablet Take 1 tablet (25 mg) by mouth once daily in the morning 90 tablet 1 04/06/2021 at Unknown time  . enalapril (VASOTEC) 10 MG tablet Take 1 tablet (10 mg total) by mouth 2 (two) times daily. 180 tablet 1 04/06/2021 at Unknown time  . levothyroxine (SYNTHROID) 88 MCG tablet Take 1 tablet (88 mcg total) by mouth daily before breakfast. 90 tablet 1 04/06/2021 at Unknown time  . montelukast (SINGULAIR) 10 MG tablet Take 1 tablet (10 mg total) by mouth at bedtime. 90 tablet 3 04/06/2021 at Unknown time  . omeprazole (PRILOSEC) 40 MG capsule Take 1 capsule (40 mg total) by mouth 2 (two) times daily. 180 capsule 3 04/06/2021 at Unknown time  . potassium chloride (KLOR-CON) 10 MEQ tablet Take 2 tablets (20 mEq total) by mouth 2 (two) times daily. 360 tablet 3  04/06/2021 at Unknown time  . albuterol (VENTOLIN HFA) 108 (90 Base) MCG/ACT inhaler Inhale 2 puffs into the lungs every 4 (four) hours as needed for wheezing or shortness of breath. 6.7 g 1   . amLODipine (NORVASC) 10 MG tablet Take 1 tablet (10 mg total) by mouth daily. 90 tablet 3   . clopidogrel (PLAVIX) 75 MG tablet Take 1 tablet (75 mg total) by mouth daily. 90 tablet 1 03/30/2021  . Fluticasone-Salmeterol (ADVAIR) 500-50  MCG/DOSE AEPB Inhale 1 puff into the lungs 2 (two) times daily. 180 each 3   . rosuvastatin (CRESTOR) 20 MG tablet Take 1 tablet (20 mg total) by mouth daily. 90 tablet 1   . Turmeric 500 MG TABS Take 1,000 mg by mouth daily.        Allergies  Allergen Reactions  . Lidocaine Rash     Past Medical History:  Diagnosis Date  . Abdominal hernia   . Arthritis   . Asthma   . Coronary artery disease    a. 2013 s/p PCI and stent placement in [facility] done for stable angina.  . Hyperlipidemia   . Hypertension   . Hypothyroidism   . Stroke [facility])    a. 2017 - ILR did not show afib.    Review of systems:  Otherwise negative.    Physical Exam  Gen: Alert, oriented. Appears stated age.  HEENT: [place]/AT. PERRLA. Lungs: CTA, no wheezes. CV: RR nl S1, S2. Abd: soft, benign, no masses. BS+ Ext: No edema. Pulses 2+    Planned procedures: Proceed with colonoscopy. The patient understands the nature of the planned procedure, indications, risks, alternatives and potential complications including but not limited to bleeding, infection, perforation, damage to internal organs and possible oversedation/side effects from anesthesia. The patient agrees and gives consent to proceed.  Please refer to procedure notes for findings, recommendations and patient disposition/instructions.     Andrea Hart K. Andrea Hart, M.D. Gastroenterology 04/06/2021  10:48 AM

## 2021-04-07 ENCOUNTER — Encounter: Payer: Self-pay | Admitting: Internal Medicine

## 2021-04-07 LAB — SURGICAL PATHOLOGY

## 2021-04-14 DIAGNOSIS — M67312 Transient synovitis, left shoulder: Secondary | ICD-10-CM | POA: Diagnosis not present

## 2021-04-20 ENCOUNTER — Encounter: Payer: Self-pay | Admitting: Family Medicine

## 2021-04-21 DIAGNOSIS — Z96651 Presence of right artificial knee joint: Secondary | ICD-10-CM | POA: Diagnosis not present

## 2021-04-22 ENCOUNTER — Encounter: Payer: Self-pay | Admitting: Nurse Practitioner

## 2021-04-22 ENCOUNTER — Other Ambulatory Visit: Payer: Self-pay

## 2021-04-22 ENCOUNTER — Ambulatory Visit (INDEPENDENT_AMBULATORY_CARE_PROVIDER_SITE_OTHER): Payer: Medicare Other | Admitting: Nurse Practitioner

## 2021-04-22 VITALS — BP 138/62 | HR 64 | Ht 62.0 in | Wt 142.0 lb

## 2021-04-22 DIAGNOSIS — E785 Hyperlipidemia, unspecified: Secondary | ICD-10-CM

## 2021-04-22 DIAGNOSIS — I10 Essential (primary) hypertension: Secondary | ICD-10-CM | POA: Diagnosis not present

## 2021-04-22 DIAGNOSIS — I2510 Atherosclerotic heart disease of native coronary artery without angina pectoris: Secondary | ICD-10-CM

## 2021-04-22 MED ORDER — VALSARTAN 160 MG PO TABS: 160.0000 mg | ORAL_TABLET | Freq: Every day | ORAL | 5 refills | Status: DC

## 2021-04-22 NOTE — Patient Instructions (Signed)
Medication Instructions:  Your physician has recommended you make the following change in your medication:   STOP Enalapril.  START Valsartan 160 mg daily. An Rx has been sent to your pharmacy.   *If you need a refill on your cardiac medications before your next appointment, please call your pharmacy*   Lab Work: None ordered If you have labs (blood work) drawn today and your tests are completely normal, you will receive your results only by: Marland Kitchen MyChart Message (if you have MyChart) OR . A paper copy in the mail If you have any lab test that is abnormal or we need to change your treatment, we will call you to review the results.   Testing/Procedures: None ordered   Follow-Up: At [facility], you and your health needs are our priority.  As part of our continuing mission to provide you with exceptional heart care, we have created designated Provider Care Teams.  These Care Teams include your primary Cardiologist (physician) and Advanced Practice Providers (APPs -  Physician Assistants and Nurse Practitioners) who all work together to provide you with the care you need, when you need it.  We recommend signing up for the patient portal called "MyChart".  Sign up information is provided on this After Visit Summary.  MyChart is used to connect with patients for Virtual Visits (Telemedicine).  Patients are able to view lab/test results, encounter notes, upcoming appointments, etc.  Non-urgent messages can be sent to your provider as well.   To learn more about what you can do with MyChart, go to [email or web address].    Your next appointment:   3 month(s)  The format for your next appointment:   In Person  Provider:   You may see Kathlyn Sacramento, MD or one of the following Advanced Practice Providers on your designated Care Team:    Murray Hodgkins, NP  Christell Faith, PA-C  Marrianne Mood, PA-C  Cadence Dimmitt, [place]  Laurann Montana, NP    Other Instructions Your  physician has requested that you regularly monitor and record your blood pressure readings at home. Please use the same machine at the same time of day to check your readings and record them. Please call the office in 2 weeks to provide an update of your blood pressure readings.

## 2021-04-22 NOTE — Progress Notes (Signed)
Office Visit    Patient Name: Andrea Hart Date of Encounter: 04/22/2021  Primary Care Provider:  Virginia Crews, MD Primary Cardiologist:  Kathlyn Sacramento, MD  Chief Complaint    85 year old female with a history of CAD status post prior stenting in 2013, hypertension, hyperlipidemia, hypothyroidism, PVCs, asthma, and prior stroke x2, who presents for follow-up of CAD and hypertension.  Past Medical History    Past Medical History:  Diagnosis Date  . Abdominal hernia   . Arthritis   . Asthma   . Carotid arterial disease ([place])    a. 11/2020 Carotid U/S: Less than 78% RICA, 29-56% LICA.  Marland Kitchen Coronary artery disease    a. 2013 s/p PCI and stent placement in [facility] done for stable angina.  . Hyperlipidemia   . Hypertension   . Hypothyroidism   . PVC's (premature ventricular contractions)    a. 01/2020 Zio: RSR, 70, frequent PVCs with 10.5% burden.  . Stroke ([place])    a. 2017 - ILR did not show afib.   Past Surgical History:  Procedure Laterality Date  . ABDOMINAL HYSTERECTOMY    . APPENDECTOMY    . CATARACT EXTRACTION Bilateral   . CHOLECYSTECTOMY    . COLONOSCOPY WITH PROPOFOL N/A 04/06/2021   Procedure: COLONOSCOPY WITH PROPOFOL;  Surgeon: Toledo, Benay Pike, MD;  Location: [facility] ENDOSCOPY;  Service: Gastroenterology;  Laterality: N/A;  . CORONARY ANGIOPLASTY  2013   1xStent [facility] [place].  Marland Kitchen EYE SURGERY    . HAND SURGERY Right   . LOOP RECORDER IMPLANT    . NISSEN FUNDOPLICATION  2130  . REPLACEMENT TOTAL KNEE Bilateral 2017  . TOTAL HIP ARTHROPLASTY Right   . TOTAL VAGINAL HYSTERECTOMY      Allergies  Allergies  Allergen Reactions  . Lidocaine Rash    History of Present Illness    85 year old female with the above past medical history including CAD status post prior stenting in 2013, hypertension, hyperlipidemia, PVCs, hypothyroidism, asthma, and prior stroke x2.  As noted, she previously underwent stenting of an unknown vessel in  [place], [place] in 2013.  In 2017, she suffered a stroke.  Implantable loop recorder did not show any atrial fibrillation and she has been maintained on aspirin and Plavix ever since.  In February 2017, she complained of presyncope and underwent event monitoring.  This showed frequent PVCs with a 10% burden.  At follow-up, she said she had a long history of PVCs however she was not having any PVCs that particular day.  She has been maintained on beta-blocker therapy.  Blood pressure has been difficult to control resulting in multiple visits in early 2021.  Renal artery duplex in August 2021 was without any stenosis.  She was last seen in cardiology clinic in August 2021, at which time her carvedilol dose was increased to 12.5 mg twice daily in the setting of ongoing hypertension.  She was otherwise feeling well.  Carotid ultrasound in December 2021 showed moderate, nonobstructive bilateral disease-left greater than right.  She was admitted in March with rectal bleeding.  H&H was stable.  She was seen by GI and it was felt that presentation was most consistent with acute diverticular bleeding.  She was advised to hold Plavix and aspirin with recommendation for colonoscopy which was subsequently performed as an outpatient on May 4, and showed nonbleeding internal hemorrhoids, moderate diverticulosis without evidence of diverticular bleeding, and a 5 mm polyp.  Follow-up H&H in the outpatient setting was  stable.  She has since resumed aspirin and Plavix, and has been feeling well.  Blood pressures at home and been trending in the 140s to 170s, which bothers her.  She notes that on days that she is active, blood pressures are usually lower and she feels better.  She denies chest pain, dyspnea, palpitations, PND, orthopnea, dizziness, syncope, edema, or early satiety.  Home Medications    Prior to Admission medications   Medication Sig Start Date End Date Taking? Authorizing Provider  albuterol  (VENTOLIN HFA) 108 (90 Base) MCG/ACT inhaler Inhale 2 puffs into the lungs every 4 (four) hours as needed for wheezing or shortness of breath. 01/04/21   Virginia Crews, MD  amLODipine (NORVASC) 10 MG tablet Take 1 tablet (10 mg total) by mouth daily. 01/04/21 04/04/21  Virginia Crews, MD  aspirin EC 81 MG tablet Take 81 mg by mouth daily.    [provider]  carvedilol (COREG) 12.5 MG tablet Take 1 tablet (12.5 mg total) by mouth 2 (two) times daily. 01/04/21 07/03/21  Virginia Crews, MD  cetirizine (ZYRTEC) 10 MG tablet Take 1 tablet (10 mg total) by mouth daily. 01/04/21   Virginia Crews, MD  chlorthalidone (HYGROTON) 25 MG tablet Take 1 tablet (25 mg) by mouth once daily in the morning 01/04/21   Virginia Crews, MD  clopidogrel (PLAVIX) 75 MG tablet Take 1 tablet (75 mg total) by mouth daily. 01/04/21   Virginia Crews, MD  enalapril (VASOTEC) 10 MG tablet Take 1 tablet (10 mg total) by mouth 2 (two) times daily. 01/04/21   Virginia Crews, MD  Fluticasone-Salmeterol (ADVAIR) 500-50 MCG/DOSE AEPB Inhale 1 puff into the lungs 2 (two) times daily. 01/04/21   Virginia Crews, MD  levothyroxine (SYNTHROID) 88 MCG tablet Take 1 tablet (88 mcg total) by mouth daily before breakfast. 01/04/21   Bacigalupo, Dionne Bucy, MD  montelukast (SINGULAIR) 10 MG tablet Take 1 tablet (10 mg total) by mouth at bedtime. 01/04/21   Virginia Crews, MD  omeprazole (PRILOSEC) 40 MG capsule Take 1 capsule (40 mg total) by mouth 2 (two) times daily. 01/04/21   Bacigalupo, Dionne Bucy, MD  potassium chloride (KLOR-CON) 10 MEQ tablet Take 2 tablets (20 mEq total) by mouth 2 (two) times daily. 01/04/21   Virginia Crews, MD  rosuvastatin (CRESTOR) 20 MG tablet Take 1 tablet (20 mg total) by mouth daily. 01/04/21 04/04/21  Virginia Crews, MD  Turmeric 500 MG TABS Take 1,000 mg by mouth daily.    [provider]    Review of Systems    She denies chest pain, palpitations, dyspnea,  pnd, orthopnea, n, v, dizziness, syncope, edema, weight gain, or early satiety.  All other systems reviewed and are otherwise negative except as noted above.  Physical Exam    VS:  BP 138/62   Pulse 64   Ht 5\' 2"  (1.575 m)   Wt 142 lb (64.4 kg)   BMI 25.97 kg/m  , BMI Body mass index is 25.97 kg/m. GEN: Well nourished, well developed, in no acute distress. HEENT: normal. Neck: Supple, no JVD, carotid bruits, or masses. Cardiac: RRR, no murmurs, rubs, or gallops. No clubbing, cyanosis, edema.  Radials 2+//PT 1+ and equal bilaterally.  Respiratory:  Respirations regular and unlabored, clear to auscultation bilaterally. GI: Soft, nontender, nondistended, BS + x 4. MS: no deformity or atrophy. Skin: warm and dry, no rash. Neuro:  Strength and sensation are intact. Psych: Normal affect.  Accessory Clinical Findings     Labs dated March 08, 2021 [facility] Everywhere):  Hemoglobin 10.1, hematocrit 31.8, WBC 6.4, platelets 277 Ferritin 54  Lab Results  Component Value Date   CREATININE 1.27 (H) 02/02/2021   BUN 28 (H) 02/02/2021   NA 136 02/02/2021   K 4.5 02/02/2021   CL 109 02/02/2021   CO2 19 (L) 02/02/2021   Lab Results  Component Value Date   ALT 11 02/02/2021   AST 16 02/02/2021   ALKPHOS 60 02/02/2021   BILITOT 0.5 02/02/2021   Lab Results  Component Value Date   CHOL 145 08/16/2020   HDL 60 08/16/2020   LDLCALC 65 08/16/2020   TRIG 111 08/16/2020   CHOLHDL 3 12/19/2019     Assessment & Plan    1.  Coronary artery disease: Status post remote stenting.  No angina or dyspnea.  Continue aspirin, statin, and beta-blocker therapy.  2.  Essential hypertension: Blood pressure 138/62 today, which is very good for her.  She says she never sees numbers like this at home, as she typically runs in the 140s to 170s.  Given her at home recordings, rather than adding 1/5 agent, I am going to switch her enalapril to valsartan 160 mg daily.  I have asked her to follow her blood  pressures over the next 2 weeks and contact us with trends.  I suspect we will need to titrate valsartan to 320 mg daily at some point.  She notes that she has follow-up with nephrology next week with labs planned at that time.  3.  Hyperlipidemia: LDL of 65 last September with normal LFTs this March.  Continue statin therapy.  4.  GI bleed/iron deficiency anemia: Status post hospitalization in March with bright red blood per rectum and hemoglobin of 7.4 and hematocrit of 22.7.  She subsequently underwent colonoscopy earlier this month showed nonbleeding internal hemorrhoids, moderate diverticulosis without evidence of diverticular bleeding and a 5 mm polyp.  She has since resumed aspirin and Plavix without recurrent bleeding.  5.  Disposition: Patient to trend blood pressures at home and contact us in about 2 weeks.  Follow-up in 3 months or sooner if necessary.  Murray Hodgkins, NP 04/22/2021, 6:04 PM

## 2021-04-26 DIAGNOSIS — I10 Essential (primary) hypertension: Secondary | ICD-10-CM | POA: Diagnosis not present

## 2021-04-26 DIAGNOSIS — D631 Anemia in chronic kidney disease: Secondary | ICD-10-CM | POA: Diagnosis not present

## 2021-04-26 DIAGNOSIS — R8290 Unspecified abnormal findings in urine: Secondary | ICD-10-CM | POA: Diagnosis not present

## 2021-04-26 DIAGNOSIS — N1832 Chronic kidney disease, stage 3b: Secondary | ICD-10-CM | POA: Diagnosis not present

## 2021-04-26 LAB — BASIC METABOLIC PANEL
BUN: 23 — AB (ref 4–21)
CO2: 22 (ref 13–22)
Chloride: 106 (ref 99–108)
Creatinine: 1.2 — AB (ref 0.5–1.1)
Glucose: 108
Potassium: 4.7 (ref 3.4–5.3)
Sodium: 139 (ref 137–147)

## 2021-04-26 LAB — COMPREHENSIVE METABOLIC PANEL
Albumin: 4.2 (ref 3.5–5.0)
Calcium: 9.7 (ref 8.7–10.7)
GFR calc Af Amer: 50
GFR calc non Af Amer: 43

## 2021-04-26 LAB — CBC AND DIFFERENTIAL
HCT: 33 — AB (ref 36–46)
Hemoglobin: 10.9 — AB (ref 12.0–16.0)
Platelets: 272 (ref 150–399)
WBC: 7.1

## 2021-04-26 LAB — IRON,TIBC AND FERRITIN PANEL
%SAT: 15
Iron: 51
TIBC: 350

## 2021-04-26 LAB — CBC: RBC: 3.68 — AB (ref 3.87–5.11)

## 2021-05-05 ENCOUNTER — Other Ambulatory Visit: Payer: Self-pay | Admitting: *Deleted

## 2021-05-05 DIAGNOSIS — I25118 Atherosclerotic heart disease of native coronary artery with other forms of angina pectoris: Secondary | ICD-10-CM

## 2021-05-05 DIAGNOSIS — I10 Essential (primary) hypertension: Secondary | ICD-10-CM

## 2021-05-05 MED ORDER — VALSARTAN 320 MG PO TABS: 320.0000 mg | ORAL_TABLET | Freq: Every day | ORAL | 3 refills | Status: DC

## 2021-05-13 ENCOUNTER — Telehealth: Payer: Self-pay | Admitting: Nurse Practitioner

## 2021-05-13 ENCOUNTER — Other Ambulatory Visit
Admission: RE | Admit: 2021-05-13 | Discharge: 2021-05-13 | Disposition: A | Discharge status: Home / Self Care | Payer: Medicare Other | Attending: Nurse Practitioner | Admitting: Nurse Practitioner

## 2021-05-13 DIAGNOSIS — I25118 Atherosclerotic heart disease of native coronary artery with other forms of angina pectoris: Secondary | ICD-10-CM | POA: Diagnosis not present

## 2021-05-13 DIAGNOSIS — I10 Essential (primary) hypertension: Secondary | ICD-10-CM | POA: Insufficient documentation

## 2021-05-13 LAB — BASIC METABOLIC PANEL
Anion gap: 8 (ref 5–15)
BUN: 21 mg/dL (ref 8–23)
CO2: 23 mmol/L (ref 22–32)
Calcium: 9.5 mg/dL (ref 8.9–10.3)
Chloride: 108 mmol/L (ref 98–111)
Creatinine, Ser: 1.1 mg/dL — ABNORMAL HIGH (ref 0.44–1.00)
GFR, Estimated: 50 mL/min — ABNORMAL LOW (ref >60)
Glucose, Bld: 122 mg/dL — ABNORMAL HIGH (ref 70–99)
Potassium: 4.5 mmol/L (ref 3.5–5.1)
Sodium: 139 mmol/L (ref 135–145)

## 2021-05-13 NOTE — Telephone Encounter (Signed)
Patient came in & dropped off BP readings.

## 2021-05-16 NOTE — Telephone Encounter (Signed)
Morning med is Carvedilol & Amlodipine New med is Valsartan Evening med is Carvedilol    05/06/21  6 am 181/80 HR 65 Med taken 7 am 9 am 173/84 HR 54 New med taken 12 pm 2 pm 154/77 HR 66 5 pm 181/78 HR 69 6 pm 195/84 HR 70 7 pm 159/77 HR 70 8 pm 173/82 HR 66 11 pm 173/86 HR 64 05/06/21 range 154-195 over 77-86 HR 54-70  05/07/21 6:30 am 179/94 HR 68 Med taken 8 am New med taken 9 am 9:30 am 161/85 HR 57 4:30 pm 142/77 HR 69 Gone from home Med taken 8 pm 10 pm 190/87 HR 67 167/80 HR 64 05/07/21 range 142-190 over 77-94 HR 57-69  05/08/21 6:30 am 192/87 HR 69 Med taken 08:30 am  9 am 171/78 HR 58 Noon 140/81 HR 59 New med taken at [facility] 3 pm 181/82 HR 62 8 pm 173/84 HR 59 Med taken 9 pm 11 pm 149/76 HR 65 05/08/21 range 140-192 over 76-87 HR 58-69  05/09/21 08:30 am 172/82 HR 61 Med taken 9 am Noon 143/79 HR 54 New med taken 1 pm  6 pm 173/83 HR 68 Med taken at 8 pm 05/09/21 range 143-173 over 79-83 HR 54-68  05/10/21 8 am 154/82 HR 65 11 am 116/63 HR 73 1 pm 159/68 HR 58 2:30 pm 156/75 HR 64 New med taken 3 pm 8 pm 159/73 HR 64 Med taken 9 pm  05/10/21 range 116-159 over 73-82 HR 58-73  05/11/21 08:30 am 177/80 HR 68 Med taken at 9 am Noon 174/87 HR 63 New med taken at 12:30 pm 4 pm 161/81 HR 59 7 pm 158/82 HR 59 Med taken 8 pm 10 pm 157/81 HR 62 05/11/21 range 157-177 over 80-87 HR 59-68  05/12/21 8 am 175/84 HR 60 Med taken 10 am 10 am 167/80 HR 57 Noon 182/81 HR 64 New med taken at noon 5 pm 157/79 HR 68 Med taken at 8 pm 10 pm 186/88 HR 65 05/12/21 range 157-186 over 79-88 HR 57-68   Spoke with patient and reviewed blood pressure readings and medications. Advised that we like to know blood pressures 2 hours after medications as this tells [place] how the medication is helping. Advised that once provider reviews these readings I will give her a call back with any changes or recommendations. She verbalized understanding with no further questions at this time.

## 2021-05-17 NOTE — Telephone Encounter (Signed)
As I'm sure she's aware, BP remains elevated.  With HRs freq in the 50's, advancing carvedilol dose is not a good option.  She is on max doses of amlodipine, chlorthalidone, and valsartan.  I recommend stopping potassium supplementation and adding spironolactone 25mg  daily.  This will require a follow-up BMET in a week, and we'd likely plan on a follow-up BMET a week or two later, depending on Potassium level next week.  Adding hydralazine is another consideration, however, for the sake of ease, I'd prefer to add something that is easier to take before moving onto a 3x/day medication.

## 2021-05-19 MED ORDER — SPIRONOLACTONE 25 MG PO TABS: 25.0000 mg | ORAL_TABLET | Freq: Every day | ORAL | 3 refills | Status: DC

## 2021-05-19 NOTE — Telephone Encounter (Signed)
Reviewed some instructions and then patient requested I speak with her daughter. Daughter who is a Software engineer. Reviewed recommendations with daughter to start spironolactone and stop potassium, she is concerned with stopping the potassium. She reports that her mother has been on this medication for over 30 years and over the last 20 years she has been taking the potassium 20 mEq twice a day. Reviewed how spironolactone can cause increased potassium levels and that we do want repeat labs in one week to monitor her levels. Will update provider regarding concerns with potassium and advised that once we get those labs back he can review and determine if we need to add something back. She verbalized understanding of our conversation, agreement with plan, and to let us know if she should have any further questions.

## 2021-05-27 ENCOUNTER — Other Ambulatory Visit
Admission: RE | Admit: 2021-05-27 | Discharge: 2021-05-27 | Disposition: A | Discharge status: Home / Self Care | Payer: Medicare Other | Attending: Nurse Practitioner | Admitting: Nurse Practitioner

## 2021-05-27 ENCOUNTER — Telehealth: Payer: Self-pay | Admitting: Cardiovascular Disease

## 2021-05-27 DIAGNOSIS — I25118 Atherosclerotic heart disease of native coronary artery with other forms of angina pectoris: Secondary | ICD-10-CM | POA: Insufficient documentation

## 2021-05-27 DIAGNOSIS — I10 Essential (primary) hypertension: Secondary | ICD-10-CM | POA: Insufficient documentation

## 2021-05-27 LAB — BASIC METABOLIC PANEL
Anion gap: 9 (ref 5–15)
BUN: 27 mg/dL — ABNORMAL HIGH (ref 8–23)
CO2: 24 mmol/L (ref 22–32)
Calcium: 9.2 mg/dL (ref 8.9–10.3)
Chloride: 105 mmol/L (ref 98–111)
Creatinine, Ser: 1.51 mg/dL — ABNORMAL HIGH (ref 0.44–1.00)
GFR, Estimated: 34 mL/min — ABNORMAL LOW (ref >60)
Glucose, Bld: 130 mg/dL — ABNORMAL HIGH (ref 70–99)
Potassium: 4.4 mmol/L (ref 3.5–5.1)
Sodium: 138 mmol/L (ref 135–145)

## 2021-05-27 MED ORDER — SPIRONOLACTONE 25 MG PO TABS: 12.5000 mg | ORAL_TABLET | Freq: Every day | ORAL | 3 refills | Status: DC

## 2021-05-27 NOTE — Telephone Encounter (Signed)
Understanding that there will be some fluctuation in BP throughout the day, on aggregate, current trends look much better.  I'm not sure that she needs to check as frequently as she does.

## 2021-05-27 NOTE — Telephone Encounter (Signed)
Reviewed results and recommendations with patient and she verbalized understanding. Instructed her to please decrease spironolactone and take only half a tablet once daily. Also reviewed that we would like her to have repeat labs in one week over at [facility]. Also reviewed she does not have to check blood pressures as frequent and that they can fluctuate. She verbalized understanding of our conversation, agreeable with plan, and had no further questions.

## 2021-05-27 NOTE — Telephone Encounter (Signed)
Patient brought in blood pressure readings for review. Will forward to provider for review.   05/20/21 07:30 am 154/77 HR 60 09:00 am Took amlodipine, carvedilol, and spironolactone 11:00 am 146/75 HR 59 11:00 am Took valsartan  12:45 pm 128/66 HR 58 2:45 pm 129/68 HR 58 5:00 pm 160/74 HR 61 8:00 pm Took carvedilol  10:00 pm 163/78 HR 61  Ranges SBP 128-163 DBP 66-78 HR 58-61   05/21/21 7:30 am 135/69 HR 58 9:00 am Took amlodipine, carvedilol, Spironolactone 11:00 am 126/68 HR 61 11:00 am Took valsartan 1:00 pm 104/56 HR 54 3:00 pm 133/71 HR 60 5:00 pm 154/75 HR 63 7:00 pm 159/71 HR 59 8:00 pm Took carvedilol 10:00 pm 125/62 HR 57  Ranges SBP 104-159 DBP 56-76 HR 54-63  05/22/21 6:30 am 151/75 HR 63 9:00 am Took amlodipine 9:30 am Took Sprinolactone 2:00 pm 103/57 HR 57 4:00 pm 162/77 HR 67 4:00 pm Took Valsartan 7:00 pm 155/79 HR 67 8:00 pm Took carvedilol 10:00 pm 162/77 HR 61  Ranges SBP 103-162 DBP 57-67 HR 57-67  05/23/21 7:30 am 167/80 HR 62 9:00 am Took amlodipine, carvedilol 11:00 am 126/64 HR 65 12:00 pm Took spironolactone 5:00 pm 148/76 HR 58 6:00 pm Took valsartan 8:00 pm Took carvedilol 10:00 pm 150/70 HR 64  Ranges SBP 126-167 DBP 34-80 HR 58-64  05/24/21 7:30 am 159/80 HR 57 9:00 am Took amlodipine and carvedilol 11:00 am 155/71 HR 54 11:00 am Took spironolactone 1:00 pm 134/69 HR 57 4:00 pm 115/61 HR 67 5 pm Took valsartan 7:00 pm 170/81 HR 58 8:00 pm Took carvedilol 10:00 pm 144/72 HR 61  Ranges SBP 115-170 DBP 61-81 HR 54-61  05/25/21 7:30 am 157/73 HR 59 9:00 am Took amlodipine and carvedilol 11:00 am 150/73 HR 55 2:00 pm 124/63 HR 60 5:00 pm 151/74 HR 67 5:00 pm Took valsartan and spironolactone  8:00 pm 160/77 HR 65 8:00 pm Took carvedilol  Ranges SBP 124-160 DBP 63-77 HR 55-67  05/26/21 7:30 am 149/80 HR 61 9:00 am Took amlodipine and carvedilol 10:00 am 123/64 HR 60 1:00 pm 113/58 HR 66 4:00 pm Took  spironolactone 4:00 pm 102/59 HR 62 6:00 pm 153/76 HR 64 6:00 pm Took spironolactone 8:00 pm 147/78 HR 65 8:00 pm Took carvedilol 10:00 pm 146/74 HR 61  Ranges SBP 102-153 DBP 58-80 HR 59-80  05/27/21 7:00 am 132/70 HR 61

## 2021-05-27 NOTE — Telephone Encounter (Signed)
Patient came by office and dropped off BP readings to be reviewed Placed in nurse box

## 2021-05-27 NOTE — Telephone Encounter (Signed)
-----   Message from Theora Gianotti, NP sent at 05/27/2021  8:44 AM EDT ----- Potassium remains normal.  Her Creatinine is elevated above recent values, currently at 1.51.  I'd like her to reduce spironolactone to a half tab daily (12.5mg ), as between it and chlorthalidone, it appears it's just a little too much diuretic.  Encourage hydration.  F/u bmet in 1 week.

## 2021-06-01 ENCOUNTER — Ambulatory Visit: Payer: Self-pay | Admitting: *Deleted

## 2021-06-01 NOTE — Telephone Encounter (Signed)
Patient is calling to report she believes allergies are aggravating her asthma- she has has 2 episodes of SOB. Patient states she does have inhaler and nebulizer which she uses. Patient states her only other symptom is nasal discharge and low grade fever. Inquired if patient has done home COVID test and she states she has not- she does not have COVID. Patient wants to be seen for her asthma. Call to office- they will review call and advised UC/overflow provider- patient is aware she will get call back.

## 2021-06-01 NOTE — Telephone Encounter (Signed)
Reason for Disposition  [1] MILD difficulty breathing (e.g., minimal/no SOB at rest, SOB with walking, pulse <100) AND [2] NEW-onset or WORSE than normal  Answer Assessment - Initial Assessment Questions 1. RESPIRATORY STATUS: "Describe your breathing?" (e.g., wheezing, shortness of breath, unable to speak, severe coughing)      Feels like she is not getting enough air in 2. ONSET: "When did this breathing problem begin?"      Sunday- has had 2 episodes 3. PATTERN "Does the difficult breathing come and go, or has it been constant since it started?"      constant 4. SEVERITY: "How bad is your breathing?" (e.g., mild, moderate, severe)    - MILD: No SOB at rest, mild SOB with walking, speaks normally in sentences, can lie down, no retractions, pulse < 100.    - MODERATE: SOB at rest, SOB with minimal exertion and prefers to sit, cannot lie down flat, speaks in phrases, mild retractions, audible wheezing, pulse 100-120.    - SEVERE: Very SOB at rest, speaks in single words, struggling to breathe, sitting hunched forward, retractions, pulse > 120      mild 5. RECURRENT SYMPTOM: "Have you had difficulty breathing before?" If Yes, ask: "When was the last time?" and "What happened that time?"      Yes- history of asthma 6. CARDIAC HISTORY: "Do you have any history of heart disease?" (e.g., heart attack, angina, bypass surgery, angioplasty)      Heart stint 7. LUNG HISTORY: "Do you have any history of lung disease?"  (e.g., pulmonary embolus, asthma, emphysema)     asthma 8. CAUSE: "What do you think is causing the breathing problem?"      Allergy effecting asthma 9. OTHER SYMPTOMS: "Do you have any other symptoms? (e.g., dizziness, runny nose, cough, chest pain, fever)     Nasal drainage, 99.6 fever 10. O2 SATURATION MONITOR:  "Do you use an oxygen saturation monitor (pulse oximeter) at home?" If Yes, "What is your reading (oxygen level) today?" "What is your usual oxygen saturation reading?"  (e.g., 95%)       no 11. PREGNANCY: "Is there any chance you are pregnant?" "When was your last menstrual period?"       N/a 12. TRAVEL: "Have you traveled out of the country in the last month?" (e.g., travel history, exposures)       N/a  Protocols used: Breathing Difficulty-A-AH

## 2021-06-01 NOTE — Telephone Encounter (Signed)
Patient referred to [facility]

## 2021-06-03 ENCOUNTER — Other Ambulatory Visit
Admission: RE | Admit: 2021-06-03 | Discharge: 2021-06-03 | Disposition: A | Discharge status: Home / Self Care | Payer: Medicare Other | Attending: Nurse Practitioner | Admitting: Nurse Practitioner

## 2021-06-03 ENCOUNTER — Telehealth: Payer: Self-pay | Admitting: Nurse Practitioner

## 2021-06-03 DIAGNOSIS — I25118 Atherosclerotic heart disease of native coronary artery with other forms of angina pectoris: Secondary | ICD-10-CM | POA: Diagnosis not present

## 2021-06-03 DIAGNOSIS — E876 Hypokalemia: Secondary | ICD-10-CM

## 2021-06-03 LAB — BASIC METABOLIC PANEL
Anion gap: 8 (ref 5–15)
BUN: 25 mg/dL — ABNORMAL HIGH (ref 8–23)
CO2: 21 mmol/L — ABNORMAL LOW (ref 22–32)
Calcium: 8.7 mg/dL — ABNORMAL LOW (ref 8.9–10.3)
Chloride: 108 mmol/L (ref 98–111)
Creatinine, Ser: 1.26 mg/dL — ABNORMAL HIGH (ref 0.44–1.00)
GFR, Estimated: 43 mL/min — ABNORMAL LOW (ref >60)
Glucose, Bld: 136 mg/dL — ABNORMAL HIGH (ref 70–99)
Potassium: 3.8 mmol/L (ref 3.5–5.1)
Sodium: 137 mmol/L (ref 135–145)

## 2021-06-03 NOTE — Telephone Encounter (Signed)
Patient states she was exposed to [place] last Sunday and asks if she should still get her labs drawn today. Please call to discuss.

## 2021-06-03 NOTE — Telephone Encounter (Signed)
Labs early next week is fine.

## 2021-06-03 NOTE — Telephone Encounter (Signed)
Spoke with patient and she states she was in a group at church and 9 out of 16 people tested positive for COVID. She personally does not have any symptoms but she is very concerned about getting out at this time. Reviewed that the labs were to monitor her kidney function after recent medication changes. Advised that if she can go on Monday or Tuesday that should be fine but that if she has no symptoms she would be fine to go today as long as she wears a mask and to frequently wash hands. She again states she is not comfortable going today. She was agreeable to go on Monday or Tuesday to have those done. Advised I would update providers but again this is important so we can monitor labs due to those medication changes. She verbalized understanding of our conversation, agreeable with plan, and had no further questions.

## 2021-06-07 MED ORDER — SPIRONOLACTONE 25 MG PO TABS: 12.5000 mg | ORAL_TABLET | Freq: Every day | ORAL | 3 refills | Status: DC

## 2021-06-07 NOTE — Telephone Encounter (Signed)
-----   Message from Theora Gianotti, NP sent at 06/03/2021  4:52 PM EDT ----- Creat stable/improved.  K remains nl.

## 2021-06-07 NOTE — Telephone Encounter (Signed)
Reviewed with patient and she is going again on Friday for repeat labs. She had no further questions but is nervous about not being on potassium. Daughter is telling her the same information we have been telling her. She will have repeat labs done and had no further questions for now.

## 2021-06-07 NOTE — Telephone Encounter (Signed)
Ok

## 2021-06-07 NOTE — Telephone Encounter (Signed)
Updated patient that lab orders have been put in and she was appreciative for the call back

## 2021-06-10 ENCOUNTER — Other Ambulatory Visit
Admission: RE | Admit: 2021-06-10 | Discharge: 2021-06-10 | Disposition: A | Discharge status: Home / Self Care | Payer: Medicare Other | Attending: Nurse Practitioner | Admitting: Nurse Practitioner

## 2021-06-10 DIAGNOSIS — I25118 Atherosclerotic heart disease of native coronary artery with other forms of angina pectoris: Secondary | ICD-10-CM | POA: Diagnosis not present

## 2021-06-10 DIAGNOSIS — E876 Hypokalemia: Secondary | ICD-10-CM | POA: Diagnosis not present

## 2021-06-10 LAB — BASIC METABOLIC PANEL
Anion gap: 7 (ref 5–15)
BUN: 22 mg/dL (ref 8–23)
CO2: 23 mmol/L (ref 22–32)
Calcium: 8.8 mg/dL — ABNORMAL LOW (ref 8.9–10.3)
Chloride: 106 mmol/L (ref 98–111)
Creatinine, Ser: 1.35 mg/dL — ABNORMAL HIGH (ref 0.44–1.00)
GFR, Estimated: 39 mL/min — ABNORMAL LOW (ref >60)
Glucose, Bld: 183 mg/dL — ABNORMAL HIGH (ref 70–99)
Potassium: 3.7 mmol/L (ref 3.5–5.1)
Sodium: 136 mmol/L (ref 135–145)

## 2021-06-17 ENCOUNTER — Other Ambulatory Visit
Admission: RE | Admit: 2021-06-17 | Discharge: 2021-06-17 | Disposition: A | Discharge status: Home / Self Care | Payer: Medicare Other | Attending: Nurse Practitioner | Admitting: Nurse Practitioner

## 2021-06-17 DIAGNOSIS — I25118 Atherosclerotic heart disease of native coronary artery with other forms of angina pectoris: Secondary | ICD-10-CM | POA: Insufficient documentation

## 2021-06-17 DIAGNOSIS — E876 Hypokalemia: Secondary | ICD-10-CM | POA: Insufficient documentation

## 2021-06-17 LAB — BASIC METABOLIC PANEL
Anion gap: 8 (ref 5–15)
BUN: 27 mg/dL — ABNORMAL HIGH (ref 8–23)
CO2: 24 mmol/L (ref 22–32)
Calcium: 9 mg/dL (ref 8.9–10.3)
Chloride: 107 mmol/L (ref 98–111)
Creatinine, Ser: 1.37 mg/dL — ABNORMAL HIGH (ref 0.44–1.00)
GFR, Estimated: 39 mL/min — ABNORMAL LOW (ref >60)
Glucose, Bld: 121 mg/dL — ABNORMAL HIGH (ref 70–99)
Potassium: 4.2 mmol/L (ref 3.5–5.1)
Sodium: 139 mmol/L (ref 135–145)

## 2021-07-06 DIAGNOSIS — B351 Tinea unguium: Secondary | ICD-10-CM | POA: Diagnosis not present

## 2021-07-06 DIAGNOSIS — L600 Ingrowing nail: Secondary | ICD-10-CM | POA: Diagnosis not present

## 2021-07-06 DIAGNOSIS — M79674 Pain in right toe(s): Secondary | ICD-10-CM | POA: Diagnosis not present

## 2021-07-21 ENCOUNTER — Ambulatory Visit (INDEPENDENT_AMBULATORY_CARE_PROVIDER_SITE_OTHER): Payer: Medicare Other | Admitting: Family Medicine

## 2021-07-21 ENCOUNTER — Encounter: Payer: Self-pay | Admitting: Family Medicine

## 2021-07-21 ENCOUNTER — Other Ambulatory Visit: Payer: Self-pay

## 2021-07-21 VITALS — BP 132/74 | HR 59 | Temp 98.3°F | Resp 16 | Ht 59.0 in | Wt 139.6 lb

## 2021-07-21 DIAGNOSIS — E039 Hypothyroidism, unspecified: Secondary | ICD-10-CM | POA: Diagnosis not present

## 2021-07-21 DIAGNOSIS — I2510 Atherosclerotic heart disease of native coronary artery without angina pectoris: Secondary | ICD-10-CM | POA: Diagnosis not present

## 2021-07-21 DIAGNOSIS — E559 Vitamin D deficiency, unspecified: Secondary | ICD-10-CM

## 2021-07-21 DIAGNOSIS — E785 Hyperlipidemia, unspecified: Secondary | ICD-10-CM | POA: Diagnosis not present

## 2021-07-21 DIAGNOSIS — D508 Other iron deficiency anemias: Secondary | ICD-10-CM | POA: Diagnosis not present

## 2021-07-21 DIAGNOSIS — I10 Essential (primary) hypertension: Secondary | ICD-10-CM

## 2021-07-21 DIAGNOSIS — N183 Chronic kidney disease, stage 3 unspecified: Secondary | ICD-10-CM

## 2021-07-21 DIAGNOSIS — J4520 Mild intermittent asthma, uncomplicated: Secondary | ICD-10-CM

## 2021-07-21 DIAGNOSIS — R7303 Prediabetes: Secondary | ICD-10-CM | POA: Insufficient documentation

## 2021-07-21 MED ORDER — ALBUTEROL SULFATE HFA 108 (90 BASE) MCG/ACT IN AERS: 2.0000 | INHALATION_SPRAY | RESPIRATORY_TRACT | 1 refills | Status: DC | PRN

## 2021-07-21 NOTE — Assessment & Plan Note (Signed)
-   Chronic and well-controlled - Recheck CMP - Continue medications - Continue following with cardiology

## 2021-07-21 NOTE — Assessment & Plan Note (Signed)
-   Chronic, previously stable, currently asymptomatic - Recheck CBC & Fe panel - Continue Fe supplementation - Will continue to monitor

## 2021-07-21 NOTE — Assessment & Plan Note (Signed)
-   Chronic, previously stable - Recheck TSH - Continue Synthroid

## 2021-07-21 NOTE — Assessment & Plan Note (Signed)
-   Stable, denies recent exacerbations - Continue Albuterol prn for flares

## 2021-07-21 NOTE — Assessment & Plan Note (Signed)
-   Chronic and stable - Recheck lipids - Continue Rosuvastatin - Continue following with cardiology

## 2021-07-21 NOTE — Assessment & Plan Note (Signed)
-   Chronic and well-controlled - Continue medications - Continue following with cardiology

## 2021-07-21 NOTE — Assessment & Plan Note (Signed)
-   Chronic, episodic hyperglycemia, controlled without medications - Recheck A1c - Will continue to monitor

## 2021-07-21 NOTE — Assessment & Plan Note (Signed)
-   Chronic, previously stable, currently asymptomatic - Will defer labs at this time due to financial concern - Counseled pt. to continue Vitamin D supplementation - Will continue to monitor

## 2021-07-21 NOTE — Assessment & Plan Note (Signed)
-   Chronic and stable - Recheck CMP, CBC

## 2021-07-21 NOTE — Progress Notes (Signed)
Established patient visit   Patient: Andrea Hart   DOB: [birth year]   85 y.o. Female  MRN: [id number] Visit Date: 07/21/2021  Today's healthcare provider: Lavon Paganini, MD   Subjective    Hypertension - Currently on chlorthalidone, spironolactone, valsartan, amlodipine, & carvedilol - Recently started spironolactone, no longer taking potassium - Following with cardiology  Hyperlipidemia - Currently on rosuvastatin, denies myalgias  Coronary Artery Disease - Followed by cardiology - Currently on ASA & Plavix - Denies chest pain, SOB, & tachycardia  Chronic Kidney Disease - Followed by nephrology - Denies urinary changes & fatigue  Hypothyroidism - Currently on Synthroid - Denies fatigue, cold intolerance, & constipation  Vitamin D Deficiency - Pt. states that she stopped taking vit D supplements 6 mos ago - Denies recent fatigue     Medications: Outpatient Medications Prior to Visit  Medication Sig   amLODipine (NORVASC) 10 MG tablet Take 1 tablet (10 mg total) by mouth daily.   aspirin EC 81 MG tablet Take 81 mg by mouth daily.   cetirizine (ZYRTEC) 10 MG tablet Take 1 tablet (10 mg total) by mouth daily.   chlorthalidone (HYGROTON) 25 MG tablet Take 1 tablet (25 mg) by mouth once daily in the morning   clopidogrel (PLAVIX) 75 MG tablet Take 1 tablet (75 mg total) by mouth daily.   Fluticasone-Salmeterol (ADVAIR) 500-50 MCG/DOSE AEPB Inhale 1 puff into the lungs 2 (two) times daily.   levothyroxine (SYNTHROID) 88 MCG tablet Take 1 tablet (88 mcg total) by mouth daily before breakfast.   montelukast (SINGULAIR) 10 MG tablet Take 1 tablet (10 mg total) by mouth at bedtime.   omeprazole (PRILOSEC) 40 MG capsule Take 1 capsule (40 mg total) by mouth 2 (two) times daily.   potassium chloride (KLOR-CON) 10 MEQ tablet Take 2 tablets (20 mEq total) by mouth 2 (two) times daily.   spironolactone (ALDACTONE) 25 MG tablet Take 0.5 tablets (12.5 mg total) by mouth  daily.   Turmeric 500 MG TABS Take 1,000 mg by mouth daily.   valsartan (DIOVAN) 320 MG tablet Take 1 tablet (320 mg total) by mouth daily.   [DISCONTINUED] albuterol (VENTOLIN HFA) 108 (90 Base) MCG/ACT inhaler Inhale 2 puffs into the lungs every 4 (four) hours as needed for wheezing or shortness of breath.   carvedilol (COREG) 12.5 MG tablet Take 1 tablet (12.5 mg total) by mouth 2 (two) times daily.   rosuvastatin (CRESTOR) 20 MG tablet Take 1 tablet (20 mg total) by mouth daily.   No facility-administered medications prior to visit.    Review of Systems  Constitutional:  Negative for activity change, chills and fever.  HENT:  Positive for hearing loss.   Eyes: Negative.   Respiratory: Negative.  Negative for chest tightness and shortness of breath.   Cardiovascular:  Negative for chest pain, palpitations and leg swelling.  Gastrointestinal: Negative.   Endocrine: Negative.  Negative for polydipsia and polyuria.  Genitourinary: Negative.   Musculoskeletal: Negative.   Skin: Negative.   Neurological: Negative.   Psychiatric/Behavioral: Negative.        Objective    BP 132/74 (BP Location: Left Arm, Patient Position: Sitting, Cuff Size: Large)   Pulse (!) 59   Temp 98.3 F (36.8 C) (Oral)   Resp 16   Ht '4\' 11"'$  (1.499 m)   Wt 139 lb 9.6 oz (63.3 kg)   BMI 28.20 kg/m     Physical Exam Constitutional:      Appearance: Normal  appearance. She is normal weight.  HENT:     Head: Normocephalic and atraumatic.     Right Ear: External ear normal.     Left Ear: External ear normal.  Eyes:     Conjunctiva/sclera: Conjunctivae normal.  Cardiovascular:     Rate and Rhythm: Normal rate and regular rhythm.     Pulses: Normal pulses.     Heart sounds: Normal heart sounds.  Pulmonary:     Effort: Pulmonary effort is normal.     Breath sounds: Normal breath sounds.  Abdominal:     General: Abdomen is flat. Bowel sounds are normal.     Palpations: Abdomen is soft.  Skin:     General: Skin is warm and dry.  Neurological:     Mental Status: She is alert.  Psychiatric:        Behavior: Behavior normal.        Thought Content: Thought content normal.     Results for orders placed or performed in visit on 07/21/21  Iron, TIBC and Ferritin Panel  Result Value Ref Range   Iron 51    TIBC 350    %SAT 15   CBC and differential  Result Value Ref Range   Hemoglobin 10.9 (A) 12.0 - 16.0   HCT 33 (A) 36 - 46   Platelets 272 150 - 399   WBC 7.1   CBC  Result Value Ref Range   RBC 3.68 (A) 3.87 - [id number]  Basic metabolic panel  Result Value Ref Range   Glucose 108    BUN 23 (A) 4 - 21   CO2 22 13 - 22   Creatinine 1.2 (A) 0.5 - 1.1   Potassium 4.7 3.4 - 5.3   Sodium 139 137 - 147   Chloride 106 99 - 108  Comprehensive metabolic panel  Result Value Ref Range   GFR calc Af Amer 50    GFR calc non Af Amer 43    Calcium 9.7 8.7 - 10.7   Albumin 4.2 3.5 - 5.0    Assessment & Plan     Problem List Items Addressed This Visit       Cardiovascular and Mediastinum   Essential hypertension - Primary    - Chronic and well-controlled - Recheck CMP - Continue medications - Continue following with cardiology      Relevant Orders   Comprehensive metabolic panel   CAD (coronary artery disease)    - Chronic and well-controlled - Continue medications - Continue following with cardiology        Respiratory   Mild intermittent asthma    - Stable, denies recent exacerbations - Continue Albuterol prn for flares      Relevant Medications   albuterol (VENTOLIN HFA) 108 (90 Base) MCG/ACT inhaler     Endocrine   Hypothyroidism    - Chronic, previously stable - Recheck TSH - Continue Synthroid      Relevant Orders   TSH     Genitourinary   CKD (chronic kidney disease), stage III (HCC)    - Chronic and stable - Recheck CMP, CBC      Relevant Orders   Comprehensive metabolic panel     Other   Anemia    - Chronic, previously stable, currently  asymptomatic - Recheck CBC & Fe panel - Continue Fe supplementation - Will continue to monitor      Hyperlipidemia    - Chronic and stable - Recheck lipids - Continue Rosuvastatin - Continue following  with cardiology      Relevant Orders   Lipid panel   Comprehensive metabolic panel   Vitamin D deficiency    - Chronic, previously stable, currently asymptomatic - Will defer labs at this time due to financial concern - Counseled pt. to continue Vitamin D supplementation - Will continue to monitor      Prediabetes    - Chronic, episodic hyperglycemia, controlled without medications - Recheck A1c - Will continue to monitor      Relevant Orders   Hemoglobin A1c     Return in about 6 months (around 01/21/2022) for AWV.        Percell Locus, MS3   Patient seen along with MS3 student Percell Locus. I personally evaluated this patient along with the student, and verified all aspects of the history, physical exam, and medical decision making as documented by the student. I agree with the student's documentation and have made all necessary edits.  Cammi Consalvo, Dionne Bucy, MD, MPH [facility]

## 2021-07-22 ENCOUNTER — Telehealth: Payer: Self-pay

## 2021-07-22 DIAGNOSIS — E039 Hypothyroidism, unspecified: Secondary | ICD-10-CM

## 2021-07-22 LAB — COMPREHENSIVE METABOLIC PANEL
ALT: 12 IU/L (ref 0–32)
AST: 17 IU/L (ref 0–40)
Albumin/Globulin Ratio: 2 (ref 1.2–2.2)
Albumin: 4.4 g/dL (ref 3.6–4.6)
Alkaline Phosphatase: 66 IU/L (ref 44–121)
BUN/Creatinine Ratio: 19 (ref 12–28)
BUN: 27 mg/dL (ref 8–27)
Bilirubin Total: 0.3 mg/dL (ref 0.0–1.2)
CO2: 21 mmol/L (ref 20–29)
Calcium: 9.5 mg/dL (ref 8.7–10.3)
Chloride: 103 mmol/L (ref 96–106)
Creatinine, Ser: 1.4 mg/dL — ABNORMAL HIGH (ref 0.57–1.00)
Globulin, Total: 2.2 g/dL (ref 1.5–4.5)
Glucose: 107 mg/dL — ABNORMAL HIGH (ref 65–99)
Potassium: 4.5 mmol/L (ref 3.5–5.2)
Sodium: 138 mmol/L (ref 134–144)
Total Protein: 6.6 g/dL (ref 6.0–8.5)
eGFR: 38 mL/min/{1.73_m2} — ABNORMAL LOW (ref >59)

## 2021-07-22 LAB — LIPID PANEL
Chol/HDL Ratio: 2.7 ratio (ref 0.0–4.4)
Cholesterol, Total: 137 mg/dL (ref 100–199)
HDL: 50 mg/dL (ref >39)
LDL Chol Calc (NIH): 64 mg/dL (ref 0–99)
Triglycerides: 128 mg/dL (ref 0–149)
VLDL Cholesterol Cal: 23 mg/dL (ref 5–40)

## 2021-07-22 LAB — TSH: TSH: 0.316 u[IU]/mL — ABNORMAL LOW (ref 0.450–4.500)

## 2021-07-22 LAB — HEMOGLOBIN A1C
Est. average glucose Bld gHb Est-mCnc: 134 mg/dL
Hgb A1c MFr Bld: 6.3 % — ABNORMAL HIGH (ref 4.8–5.6)

## 2021-07-22 MED ORDER — LEVOTHYROXINE SODIUM 75 MCG PO TABS: 75.0000 ug | ORAL_TABLET | Freq: Every day | ORAL | 0 refills | Status: DC

## 2021-07-22 NOTE — Telephone Encounter (Signed)
-----   Message from Virginia Crews, MD sent at 07/22/2021  8:06 AM EDT ----- Normal/stable labs, except high A1c and low TSH.Hemoglobin A1c, 3 month avg of blood sugars, is in prediabetic range.  In order to prevent progression to diabetes, recommend low carb diet and regular exercise.  For low TSH, if she is taking Synthroid 88 mcg daily and has not had any extra doses recently, this could be too high of a dose and we should decrease to 75 mcg daily.  Recheck TSH in 2 months

## 2021-08-03 ENCOUNTER — Ambulatory Visit (INDEPENDENT_AMBULATORY_CARE_PROVIDER_SITE_OTHER): Payer: Medicare Other | Admitting: Nurse Practitioner

## 2021-08-03 ENCOUNTER — Other Ambulatory Visit: Payer: Self-pay

## 2021-08-03 ENCOUNTER — Encounter: Payer: Self-pay | Admitting: Nurse Practitioner

## 2021-08-03 VITALS — BP 116/60 | HR 53

## 2021-08-03 DIAGNOSIS — I493 Ventricular premature depolarization: Secondary | ICD-10-CM | POA: Diagnosis not present

## 2021-08-03 DIAGNOSIS — E785 Hyperlipidemia, unspecified: Secondary | ICD-10-CM | POA: Diagnosis not present

## 2021-08-03 DIAGNOSIS — I2510 Atherosclerotic heart disease of native coronary artery without angina pectoris: Secondary | ICD-10-CM

## 2021-08-03 DIAGNOSIS — N1832 Chronic kidney disease, stage 3b: Secondary | ICD-10-CM

## 2021-08-03 DIAGNOSIS — I10 Essential (primary) hypertension: Secondary | ICD-10-CM | POA: Diagnosis not present

## 2021-08-03 DIAGNOSIS — I6523 Occlusion and stenosis of bilateral carotid arteries: Secondary | ICD-10-CM | POA: Diagnosis not present

## 2021-08-03 MED ORDER — CLOPIDOGREL BISULFATE 75 MG PO TABS: 75.0000 mg | ORAL_TABLET | Freq: Every day | ORAL | 6 refills | Status: DC

## 2021-08-03 MED ORDER — CARVEDILOL 12.5 MG PO TABS: 12.5000 mg | ORAL_TABLET | Freq: Two times a day (BID) | ORAL | 6 refills | Status: DC

## 2021-08-03 MED ORDER — VALSARTAN 320 MG PO TABS: 320.0000 mg | ORAL_TABLET | Freq: Every day | ORAL | 6 refills | Status: DC

## 2021-08-03 MED ORDER — CHLORTHALIDONE 25 MG PO TABS: ORAL_TABLET | ORAL | 6 refills | Status: DC

## 2021-08-03 NOTE — Progress Notes (Signed)
Office Visit    Patient Name: Andrea Hart Date of Encounter: 08/03/2021  Primary Care Provider:  Virginia Crews, MD Primary Cardiologist:  Andrea Sacramento, MD  Chief Complaint    85 year old female with a history of CAD status post prior stenting in 2013, hypertension, hyperlipidemia, hypothyroidism, CKD III, PVCs, asthma, and prior stroke x2, presents for follow-up of CAD and hypertension.  Past Medical History    Past Medical History:  Diagnosis Date   Abdominal hernia    Arthritis    Asthma    Carotid arterial disease ([place])    a. 11/2020 Carotid U/S: Less than [id number] RICA, 0000000 LICA.   Coronary artery disease    a. 2013 s/p PCI and stent placement in [facility] done for stable angina.   GI bleeding 02/02/2021   Hyperlipidemia    Hypertension    Hypothyroidism    PVC's (premature ventricular contractions)    a. 01/2020 Zio: RSR, 70, frequent PVCs with 10.5% burden.   Stroke [facility])    a. 2017 - ILR did not show afib.   Past Surgical History:  Procedure Laterality Date   ABDOMINAL HYSTERECTOMY     APPENDECTOMY     CATARACT EXTRACTION Bilateral    CHOLECYSTECTOMY     COLONOSCOPY WITH PROPOFOL N/A 04/06/2021   Procedure: COLONOSCOPY WITH PROPOFOL;  Surgeon: Toledo, Benay Pike, MD;  Location: [facility] ENDOSCOPY;  Service: Gastroenterology;  Laterality: N/A;   CORONARY ANGIOPLASTY  2013   1xStent [facility] [place].   EYE SURGERY     HAND SURGERY Right    LOOP RECORDER IMPLANT     NISSEN FUNDOPLICATION  [id number]   REPLACEMENT TOTAL KNEE Bilateral 2017   TOTAL HIP ARTHROPLASTY Right    TOTAL VAGINAL HYSTERECTOMY      Allergies  Allergies  Allergen Reactions   Lidocaine Rash    History of Present Illness    85 year old female with the above past medical history including CAD status post prior stenting in 2013, hypertension, hyperlipidemia, CKD III, PVCs, hypothyroidism, asthma, and prior stroke x2.  She previously underwent stenting of an unknown  vessel in [place], [place] in 2013.  In 2017, she suffered a stroke.  Implantable loop recorder did not show any atrial fibrillation and she has been maintained on aspirin and Plavix ever since.  In February 2017, she complained of presyncope and underwent event monitoring.  This showed frequent PVCs with a 10% burden.  Patient reported a long history of PVCs.  She been managed with beta-blocker.  She has a history of difficult to manage blood pressure with renal artery duplex in August 2021 showing no evidence of stenosis.  Carotid ultrasound December 2021 showed moderate, nonobstructive bilateral disease   L>R.  In March 2022, she was admitted with rectal bleeding and stable H&H.  GI felt the presentation was most consistent with acute diverticular bleed.  Aspirin and Plavix were held, and she underwent outpatient colonoscopy on May 4 showing nonbleeding internal hemorrhoids, moderate diverticulosis without evidence of diverticular bleeding, and a 5 mm polyp.  She subsequently resumed aspirin and Plavix and has had stable hemoglobins/hematocrits.  Ms. Andrea Hart was last seen in cardiology clinic on Apr 22, 2021.  At that time, she felt well and reported blood pressures typically run in the 140s to 170s at home.  Enalapril was switched to valsartan which was subsequently titrated in the outpatient setting to 320 mg daily.  Spironolactone was subsequently added and potassium supplementation discontinued.  Potassiums have  been stable since.  Creatinine did rise to 1.5 following addition of spironolactone resulting in the dose being reduced to 12.5 mg daily.  Creatinines have since been relatively stable.  Today, she notes that she has been feeling exceptionally well.  She is pleased with how her BPs have been trending @ home (generally normal with systolics ranging from [id number]).  She remains very active and frequently participates in social events with her church.  She denies chest pain, dyspnea,  palpitations, PND, orthopnea, dizziness, syncope, edema, or early satiety.  Home Medications    Current Outpatient Medications  Medication Sig Dispense Refill   albuterol (VENTOLIN HFA) 108 (90 Base) MCG/ACT inhaler Inhale 2 puffs into the lungs every 4 (four) hours as needed for wheezing or shortness of breath. 6.7 g 1   amLODipine (NORVASC) 10 MG tablet Take 1 tablet (10 mg total) by mouth daily. 90 tablet 3   aspirin EC 81 MG tablet Take 81 mg by mouth daily.     carvedilol (COREG) 12.5 MG tablet Take 1 tablet (12.5 mg total) by mouth 2 (two) times daily. 60 tablet 6   cetirizine (ZYRTEC) 10 MG tablet Take 1 tablet (10 mg total) by mouth daily. 90 tablet 3   chlorthalidone (HYGROTON) 25 MG tablet Take 1 tablet (25 mg) by mouth once daily in the morning 30 tablet 6   clopidogrel (PLAVIX) 75 MG tablet Take 1 tablet (75 mg total) by mouth daily. 30 tablet 6   Fluticasone-Salmeterol (ADVAIR) 500-50 MCG/DOSE AEPB Inhale 1 puff into the lungs 2 (two) times daily. 180 each 3   levothyroxine (SYNTHROID) 75 MCG tablet Take 1 tablet (75 mcg total) by mouth daily before breakfast. 90 tablet 0   montelukast (SINGULAIR) 10 MG tablet Take 1 tablet (10 mg total) by mouth at bedtime. 90 tablet 3   omeprazole (PRILOSEC) 40 MG capsule Take 1 capsule (40 mg total) by mouth 2 (two) times daily. 180 capsule 3   rosuvastatin (CRESTOR) 20 MG tablet Take 1 tablet (20 mg total) by mouth daily. 90 tablet 1   spironolactone (ALDACTONE) 25 MG tablet Take 0.5 tablets (12.5 mg total) by mouth daily. 45 tablet 3   Turmeric 500 MG TABS Take 1,000 mg by mouth daily.     valsartan (DIOVAN) 320 MG tablet Take 1 tablet (320 mg total) by mouth daily. 30 tablet 6   No current facility-administered medications for this visit.     Review of Systems    Feeling well since her last visit.  She denies chest pain, palpitations, dyspnea, pnd, orthopnea, n, v, dizziness, syncope, edema, weight gain, or early satiety.  All other  systems reviewed and are otherwise negative except as noted above.  Physical Exam    VS:  BP 116/60 (BP Location: Left Arm, Patient Position: Sitting)   Pulse (!) 53   SpO2 93%  , BMI There is no height or weight on file to calculate BMI.     GEN: Well nourished, well developed, in no acute distress. HEENT: normal. Neck: Supple, no JVD, carotid bruits, or masses. Cardiac: RRR, no murmurs, rubs, or gallops. No clubbing, cyanosis, edema.  Radials 2+, PT 1+ and equal bilaterally.  Respiratory:  Respirations regular and unlabored, clear to auscultation bilaterally. GI: Soft, nontender, nondistended, BS + x 4. MS: no deformity or atrophy. Skin: warm and dry, no rash. Neuro:  Strength and sensation are intact. Psych: Normal affect.  Accessory Clinical Findings    ECG personally reviewed by me today -sinus  bradycardia, 54, nonspecific T changes- no acute changes.  Lab Results  Component Value Date   WBC 7.1 04/26/2021   HGB 10.9 (A) 04/26/2021   HCT 33 (A) 04/26/2021   MCV 93.5 02/02/2021   PLT 272 04/26/2021   Lab Results  Component Value Date   CREATININE 1.40 (H) 07/21/2021   BUN 27 07/21/2021   NA 138 07/21/2021   K 4.5 07/21/2021   CL 103 07/21/2021   CO2 21 07/21/2021   Lab Results  Component Value Date   ALT 12 07/21/2021   AST 17 07/21/2021   ALKPHOS 66 07/21/2021   BILITOT 0.3 07/21/2021   Lab Results  Component Value Date   CHOL 137 07/21/2021   HDL 50 07/21/2021   LDLCALC 64 07/21/2021   TRIG 128 07/21/2021   CHOLHDL 2.7 07/21/2021    Lab Results  Component Value Date   HGBA1C 6.3 (H) 07/21/2021    Assessment & Plan    1.  Coronary artery disease: Status post remote stenting.  She remains active without chest pain or dyspnea.  Continue aspirin, statin, and beta-blocker therapy.  2.  Essential hypertension: Blood pressure has been trending much better at home and she is 116/80 today.  She notes that the addition of spironolactone really seems to have  made a difference.  She is otherwise on carvedilol, amlodipine, chlorthalidone, and valsartan.  She no longer requires potassium supplementation and she had labs on August 18 with potassium 4.5 relatively stable creatinine of 1.40.  3.  Hyperlipidemia: She had lipids drawn last week.  LDL 64.  LFTs normal.  She remains on rosuvastatin therapy.  4.  Stage III chronic kidney disease: Creatinine relatively stable at 1.4 on August 18.  Followed by nephrology.  5.  History of GI bleed/iron deficiency anemia: No known bleeding recently.  H&H were stable in May at 10.9 and 33 respectively.  6.  PVCs: Quiescent on beta-blocker therapy.  7.  Carotid arterial disease: 50 to 69% left internal carotid artery stenosis on ultrasound December 2021.  Less than 50% on the right.  Continue aspirin, Plavix, and statin therapy.  She will be due for follow-up ultrasound later this year.  8.  Disposition: Follow-up in clinic in 6 months or sooner if necessary.  Murray Hodgkins, NP 08/03/2021, 10:06 AM

## 2021-08-03 NOTE — Addendum Note (Signed)
Addended by: Kavin Leech on: 08/03/2021 11:41 AM   Modules accepted: Orders

## 2021-08-03 NOTE — Patient Instructions (Signed)
Medication Instructions:  No changes at this time.  *If you need a refill on your cardiac medications before your next appointment, please call your pharmacy*   Lab Work: None  If you have labs (blood work) drawn today and your tests are completely normal, you will receive your results only by: [place] (if you have MyChart) OR A paper copy in the mail If you have any lab test that is abnormal or we need to change your treatment, we will call you to review the results.   Testing/Procedures: None    Follow-Up: At [facility], you and your health needs are our priority.  As part of our continuing mission to provide you with exceptional heart care, we have created designated Provider Care Teams.  These Care Teams include your primary Cardiologist (physician) and Advanced Practice Providers (APPs -  Physician Assistants and Nurse Practitioners) who all work together to provide you with the care you need, when you need it.   Your next appointment:   6 month(s)  The format for your next appointment:   In Person  Provider:   Kathlyn Sacramento, MD or Murray Hodgkins, NP

## 2021-08-23 ENCOUNTER — Other Ambulatory Visit: Payer: Self-pay | Admitting: Family Medicine

## 2021-08-23 DIAGNOSIS — E785 Hyperlipidemia, unspecified: Secondary | ICD-10-CM

## 2021-08-23 DIAGNOSIS — I10 Essential (primary) hypertension: Secondary | ICD-10-CM

## 2021-08-23 DIAGNOSIS — I2510 Atherosclerotic heart disease of native coronary artery without angina pectoris: Secondary | ICD-10-CM

## 2021-09-16 DIAGNOSIS — Z23 Encounter for immunization: Secondary | ICD-10-CM | POA: Diagnosis not present

## 2021-10-03 DIAGNOSIS — E039 Hypothyroidism, unspecified: Secondary | ICD-10-CM | POA: Diagnosis not present

## 2021-10-04 ENCOUNTER — Telehealth: Payer: Self-pay | Admitting: Family Medicine

## 2021-10-04 DIAGNOSIS — E039 Hypothyroidism, unspecified: Secondary | ICD-10-CM

## 2021-10-04 LAB — TSH: TSH: 2.33 u[IU]/mL (ref 0.450–4.500)

## 2021-10-04 MED ORDER — LEVOTHYROXINE SODIUM 75 MCG PO TABS: 75.0000 ug | ORAL_TABLET | Freq: Every day | ORAL | 1 refills | Status: DC

## 2021-10-04 NOTE — Telephone Encounter (Signed)
Patient following up on request below and would [facility]. Patient would like a call back today so she can inform the mail order regarding the new rx    [facility] [place] [place] Phone:  [phone]  Fax:  [phone]

## 2021-10-04 NOTE — Telephone Encounter (Signed)
Refill sent as per stable lab results.

## 2021-10-04 NOTE — Telephone Encounter (Signed)
Pt was advised to take  levothyroxine (SYNTHROID) 75 MCG tablet for 3 months and wants to know if she is still suppose to be taking this after her blood work results /so she can place refill for this / please advise

## 2021-10-13 DIAGNOSIS — H18593 Other hereditary corneal dystrophies, bilateral: Secondary | ICD-10-CM | POA: Diagnosis not present

## 2021-10-28 ENCOUNTER — Telehealth: Payer: Self-pay | Admitting: Family Medicine

## 2021-10-28 NOTE — Telephone Encounter (Signed)
Medication Refill - Medication: intetra Iron tablet  Has the patient contacted their pharmacy? Yes.   (Agent: If no, request that the patient contact the pharmacy for the refill. If patient does not wish to contact the pharmacy document the reason why and proceed with request.) (Agent: If yes, when and what did the pharmacy advise?)they do not have this RX/ call pcp   Preferred Pharmacy (with phone number or street name):  [facility] [place] Phone:  [phone]  Fax:  [phone]     Has the patient been seen for an appointment in the last year OR does the patient have an upcoming appointment? Yes.    Agent: Please be advised that RX refills may take up to 3 business days. We ask that you follow-up with your pharmacy.

## 2021-10-31 DIAGNOSIS — I10 Essential (primary) hypertension: Secondary | ICD-10-CM | POA: Diagnosis not present

## 2021-10-31 DIAGNOSIS — D631 Anemia in chronic kidney disease: Secondary | ICD-10-CM | POA: Diagnosis not present

## 2021-10-31 DIAGNOSIS — R809 Proteinuria, unspecified: Secondary | ICD-10-CM | POA: Diagnosis not present

## 2021-10-31 DIAGNOSIS — N1832 Chronic kidney disease, stage 3b: Secondary | ICD-10-CM | POA: Diagnosis not present

## 2021-10-31 DIAGNOSIS — U071 COVID-19: Secondary | ICD-10-CM | POA: Diagnosis not present

## 2021-10-31 MED ORDER — INTEGRA PLUS PO CAPS: 1.0000 | ORAL_CAPSULE | Freq: Every day | ORAL | 1 refills | Status: DC

## 2021-10-31 NOTE — Telephone Encounter (Signed)
Ok to refill 

## 2021-10-31 NOTE — Telephone Encounter (Signed)
Pt is wanting a cb re this iron pill being refilled [phone]

## 2021-11-17 DIAGNOSIS — D631 Anemia in chronic kidney disease: Secondary | ICD-10-CM | POA: Diagnosis not present

## 2021-11-17 DIAGNOSIS — R809 Proteinuria, unspecified: Secondary | ICD-10-CM | POA: Diagnosis not present

## 2021-11-17 DIAGNOSIS — I10 Essential (primary) hypertension: Secondary | ICD-10-CM | POA: Diagnosis not present

## 2021-11-17 DIAGNOSIS — N1832 Chronic kidney disease, stage 3b: Secondary | ICD-10-CM | POA: Diagnosis not present

## 2021-11-25 ENCOUNTER — Telehealth: Payer: Self-pay | Admitting: Family Medicine

## 2021-11-25 NOTE — Telephone Encounter (Signed)
Pt is calling to request a telephone call for her AWV on Tuesday. Pt is requesting a call back to confirm.

## 2021-11-29 ENCOUNTER — Ambulatory Visit (INDEPENDENT_AMBULATORY_CARE_PROVIDER_SITE_OTHER): Payer: Medicare Other

## 2021-11-29 DIAGNOSIS — M25569 Pain in unspecified knee: Secondary | ICD-10-CM | POA: Insufficient documentation

## 2021-11-29 DIAGNOSIS — M179 Osteoarthritis of knee, unspecified: Secondary | ICD-10-CM | POA: Insufficient documentation

## 2021-11-29 DIAGNOSIS — Z0000 Encounter for general adult medical examination without abnormal findings: Secondary | ICD-10-CM

## 2021-11-29 NOTE — Progress Notes (Signed)
Virtual Visit via Telephone Note  I connected with  Andrea Hart on 11/29/21 at  1:40 PM EST by telephone and verified that I am speaking with the correct person using two identifiers.  Location: Patient: home Provider: [facility] Persons participating in the virtual visit: [place]   I discussed the limitations, risks, security and privacy concerns of performing an evaluation and management service by telephone and the availability of in person appointments. The patient expressed understanding and agreed to proceed.  Interactive audio and video telecommunications were attempted between this nurse and patient, however failed, due to patient having technical difficulties OR patient did not have access to video capability.  We continued and completed visit with audio only.  Some vital signs may be absent or patient reported.   [name], LPN  Subjective:   Andrea Hart is a 85 y.o. female who presents for Medicare Annual (Subsequent) preventive examination.  Review of Systems     Cardiac Risk Factors include: advanced age (>70men, >4 women)     Objective:    There were no vitals filed for this visit. There is no height or weight on file to calculate BMI.  Advanced Directives 11/29/2021 04/06/2021 02/02/2021 11/23/2020  Does Patient Have a Medical Advance Directive? No Yes Yes Yes  Type of Advance Directive - - [place];Living will [place];Living will  Does patient want to make changes to medical advance directive? - - No - Patient declined -  Copy of [place] in Chart? - - No - copy requested No - copy requested  Would patient like information on creating a medical advance directive? No - Patient declined - No - Patient declined -    Current Medications (verified) Outpatient Encounter Medications as of 11/29/2021  Medication Sig   albuterol (VENTOLIN HFA) 108 (90 Base) MCG/ACT inhaler Inhale 2 puffs  into the lungs every 4 (four) hours as needed for wheezing or shortness of breath.   aspirin EC 81 MG tablet Take 81 mg by mouth daily.   carvedilol (COREG) 12.5 MG tablet TAKE 1 TABLET BY MOUTH 2 TIMES DAILY.   cetirizine (ZYRTEC) 10 MG tablet Take 1 tablet (10 mg total) by mouth daily.   chlorthalidone (HYGROTON) 25 MG tablet TAKE 1 TABLET (25 MG) BY MOUTH ONCE DAILY IN THE MORNING   clopidogrel (PLAVIX) 75 MG tablet TAKE 1 TABLET BY MOUTH EVERY DAY   FeFum-FePoly-FA-B Cmp-C-Biot (INTEGRA PLUS) CAPS Take 1 capsule by mouth daily.   Fluticasone-Salmeterol (ADVAIR) 500-50 MCG/DOSE AEPB Inhale 1 puff into the lungs 2 (two) times daily.   levothyroxine (SYNTHROID) 75 MCG tablet Take 1 tablet (75 mcg total) by mouth daily before breakfast.   montelukast (SINGULAIR) 10 MG tablet Take 1 tablet (10 mg total) by mouth at bedtime.   omeprazole (PRILOSEC) 40 MG capsule Take 1 capsule (40 mg total) by mouth 2 (two) times daily.   rosuvastatin (CRESTOR) 20 MG tablet TAKE 1 TABLET BY MOUTH EVERY DAY   Turmeric 500 MG TABS Take 1,000 mg by mouth daily.   valsartan (DIOVAN) 320 MG tablet Take 1 tablet (320 mg total) by mouth daily.   amLODipine (NORVASC) 10 MG tablet Take 1 tablet (10 mg total) by mouth daily.   spironolactone (ALDACTONE) 25 MG tablet Take 0.5 tablets (12.5 mg total) by mouth daily.   No facility-administered encounter medications on file as of 11/29/2021.    Allergies (verified) Lidocaine   History: Past Medical History:  Diagnosis Date   Abdominal hernia    Arthritis    Asthma    Carotid arterial disease ([place])    a. 11/2020 Carotid U/S: Less than 77% RICA, 82-42% LICA.   CKD (chronic kidney disease), stage III ([place])    Coronary artery disease    a. 2013 s/p PCI and stent placement in [facility] done for stable angina.   GI bleeding 02/02/2021   Hyperlipidemia    Hypertension    Hypothyroidism    PVC's (premature ventricular contractions)    a. 01/2020 [place]:  RSR, 70, frequent PVCs with 10.5% burden.   Stroke [facility])    a. 2017 - ILR did not show afib.   Past Surgical History:  Procedure Laterality Date   ABDOMINAL HYSTERECTOMY     APPENDECTOMY     CATARACT EXTRACTION Bilateral    CHOLECYSTECTOMY     COLONOSCOPY WITH PROPOFOL N/A 04/06/2021   Procedure: COLONOSCOPY WITH PROPOFOL;  Surgeon: Toledo, Benay Pike, MD;  Location: [facility] ENDOSCOPY;  Service: Gastroenterology;  Laterality: N/A;   CORONARY ANGIOPLASTY  2013   1xStent [facility] [place].   EYE SURGERY     HAND SURGERY Right    LOOP RECORDER IMPLANT     NISSEN FUNDOPLICATION  [id number]   REPLACEMENT TOTAL KNEE Bilateral 2017   TOTAL HIP ARTHROPLASTY Right    TOTAL VAGINAL HYSTERECTOMY     Family History  Problem Relation Age of Onset   Heart Problems Mother    Heart attack Mother    Heart Problems Father    Heart attack Brother    Heart Problems Brother    Colon cancer Neg Hx    Breast cancer Neg Hx    Social History   Socioeconomic History   Marital status: Widowed    Spouse name: Not on file   Number of children: 2   Years of education: Not on file   Highest education level: Some college, no degree  Occupational History    Comment: retired Science writer  Tobacco Use   Smoking status: Never   Smokeless tobacco: Never  Vaping Use   Vaping Use: Never used  Substance and Sexual Activity   Alcohol use: Not Currently    Comment: Occasional   Drug use: Never   Sexual activity: Never    Birth control/protection: Surgical  Other Topics Concern   Not on file  Social History Narrative   Moved here in 2019 , had been living in [place].    Born here, raised daughters here.    Lives with daughter now, [place].   Lost one daughter in car wreck.    Social Determinants of Health   Financial Resource Strain: Low Risk    Difficulty of Paying Living Expenses: Not hard at all  Food Insecurity: No Food Insecurity   Worried About Charity fundraiser in the Last Year: Never true   [place] in the Last Year: Never true  Transportation Needs: No Transportation Needs   Lack of Transportation (Medical): No   Lack of Transportation (Non-Medical): No  Physical Activity: Insufficiently Active   Days of Exercise per Week: 2 days   Minutes of Exercise per Session: 20 min  Stress: No Stress Concern Present   Feeling of Stress : Not at all  Social Connections: Moderately Integrated   Frequency of Communication with Friends and Family: Three times a week   Frequency of Social Gatherings with Friends and Family: Twice a week   Attends Religious Services:  More than 4 times per year   Active Member of Clubs or Organizations: Yes   Attends Archivist Meetings: More than 4 times per year   Marital Status: Widowed    Tobacco Counseling Counseling given: Not Answered   Clinical Intake:  Pre-visit preparation completed: Yes  Pain : No/denies pain     Nutritional Risks: None Diabetes: No  How often do you need to have someone help you when you read instructions, pamphlets, or other written materials from your doctor or pharmacy?: 1 - Never  Diabetic?no  Interpreter Needed?: No  Information entered by :: Kirke Shaggy, LPN   Activities of Daily Living In your present state of health, do you have any difficulty performing the following activities: 11/29/2021 07/21/2021  Hearing? N Y  Vision? N Y  Difficulty concentrating or making decisions? N N  Walking or climbing stairs? N Y  Dressing or bathing? N N  Doing errands, shopping? N Y  Conservation officer, nature and eating ? N -  Using the Toilet? N -  In the past six months, have you accidently leaked urine? N -  Do you have problems with loss of bowel control? N -  Managing your Medications? N -  Managing your Finances? N -  Housekeeping or managing your Housekeeping? N -  Some recent data might be hidden    Patient Care Team: Bacigalupo, Dionne Bucy, MD as PCP - General (Family Medicine) Wellington Hampshire,  MD as PCP - Cardiology (Cardiology) Vladimir Crofts, MD as Consulting Physician (Neurology) Murlean Iba, MD (Nephrology) Clyde Canterbury, MD as Referring Physician (Otolaryngology) Urbano Heir as Physician Assistant (Orthopedic Surgery)  Indicate any recent Medical Services you may have received from other than [facility] providers in the past year (date may be approximate).     Assessment:   This is a routine wellness examination for [place].  Hearing/Vision screen Hearing Screening - Comments:: Hearing is good- no aids Vision Screening - Comments:: Doesn't wear glasses- Eye clinic at [place] issues and exercise activities discussed: Current Exercise Habits: The patient does not participate in regular exercise at present   Goals Addressed             This Visit's Progress    DIET - EAT MORE FRUITS AND VEGETABLES         Depression Screen PHQ 2/9 Scores 11/29/2021 07/21/2021 01/04/2021 08/13/2020 12/29/2019 04/29/2019 07/29/2018  PHQ - 2 Score 0 0 0 0 0 0 0  PHQ- 9 Score - 1 0 0 0 0 -    Fall Risk Fall Risk  11/29/2021 07/21/2021 01/04/2021 11/23/2020 08/13/2020  Falls in the past year? 0 0 0 0 0  Number falls in past yr: 0 0 0 0 0  Injury with Fall? 0 0 0 0 0  Risk for fall due to : No Fall Risks No Fall Risks - - Impaired balance/gait  Follow up Falls evaluation completed Falls evaluation completed - - Falls evaluation completed;Falls prevention discussed    FALL RISK PREVENTION PERTAINING TO THE HOME:  Any stairs in or around the home? Yes  If so, are there any without handrails? No  Home free of loose throw rugs in walkways, pet beds, electrical cords, etc? Yes  Adequate lighting in your home to reduce risk of falls? Yes   ASSISTIVE DEVICES UTILIZED TO PREVENT FALLS:  Life alert? No  Use of a cane, walker or w/c? No  Grab bars in the bathroom? Yes  Shower chair or bench in shower? Yes  Elevated toilet seat or a handicapped toilet? No     Cognitive Function:Normal cognitive status assessed by direct observation by this Nurse Health Advisor. No abnormalities found.          Immunizations Immunization History  Administered Date(s) Administered   Influenza, High Dose Seasonal PF 09/02/2020   PFIZER(Purple Top)SARS-COV-2 Vaccination 08/03/2020, 09/02/2020    TDAP status: Due, Education has been provided regarding the importance of this vaccine. Advised may receive this vaccine at local pharmacy or Health Dept. Aware to provide a copy of the vaccination record if obtained from local pharmacy or Health Dept. Verbalized acceptance and understanding.  Flu Vaccine status: Up to date  Pneumococcal vaccine status: Declined,  Education has been provided regarding the importance of this vaccine but patient still declined. Advised may receive this vaccine at local pharmacy or Health Dept. Aware to provide a copy of the vaccination record if obtained from local pharmacy or Health Dept. Verbalized acceptance and understanding.   Covid-19 vaccine status: Completed vaccines  Qualifies for Shingles Vaccine? No   Zostavax completed No   Shingrix Completed?: No.    Education has been provided regarding the importance of this vaccine. Patient has been advised to call insurance company to determine out of pocket expense if they have not yet received this vaccine. Advised may also receive vaccine at local pharmacy or Health Dept. Verbalized acceptance and understanding.  Screening Tests Health Maintenance  Topic Date Due   Pneumonia Vaccine 64+ Years old (1 - PCV) Never done   TETANUS/TDAP  Never done   Zoster Vaccines- Shingrix (1 of 2) Never done   DEXA SCAN  Never done   COVID-19 Vaccine (3 - Pfizer risk series) 09/30/2020   INFLUENZA VACCINE  07/04/2021   HPV VACCINES  Aged Out    Health Maintenance  Health Maintenance Due  Topic Date Due   Pneumonia Vaccine 60+ Years old (1 - PCV) Never done   TETANUS/TDAP  Never done    Zoster Vaccines- Shingrix (1 of 2) Never done   DEXA SCAN  Never done   COVID-19 Vaccine (3 - Pfizer risk series) 09/30/2020   INFLUENZA VACCINE  07/04/2021    Colorectal cancer screening: Type of screening: Colonoscopy. Completed 04/06/21. Repeat every aged out years  Mammogram status: No longer required due to age.  Lung Cancer Screening: (Low Dose CT Chest recommended if Age 75-80 years, 30 pack-year currently smoking OR have quit w/in 15years.) does not qualify.    Additional Screening:  Hepatitis C Screening: does not qualify; Completed no  Vision Screening: Recommended annual ophthalmology exams for early detection of glaucoma and other disorders of the eye. Is the patient up to date with their annual eye exam?  Yes  Who is the provider or what is the name of the office in which the patient attends annual eye exams? MD in eye clinic at [facility] If pt is not established with a provider, would they like to be referred to a provider to establish care? No .   Dental Screening: Recommended annual dental exams for proper oral hygiene  Community Resource Referral / Chronic Care Management: CRR required this visit?  No   CCM required this visit?  No      Plan:     I have personally reviewed and noted the following in the patients chart:   Medical and social history Use of alcohol, tobacco or illicit drugs  Current medications and supplements including  opioid prescriptions.  Functional ability and status Nutritional status Physical activity Advanced directives List of other physicians Hospitalizations, surgeries, and ER visits in previous 12 months Vitals Screenings to include cognitive, depression, and falls Referrals and appointments  In addition, I have reviewed and discussed with patient certain preventive protocols, quality metrics, and best practice recommendations. A written personalized care plan for preventive services as well as general preventive health  recommendations were provided to patient.     [name], LPN   [id number]   Nurse Notes: none

## 2021-11-29 NOTE — Patient Instructions (Signed)
Andrea Hart , Thank you for taking time to come for your Medicare Wellness Visit. I appreciate your ongoing commitment to your health goals. Please review the following plan we discussed and let me know if I can assist you in the future.   Screening recommendations/referrals: Colonoscopy: 04/06/21 Mammogram: aged out Bone Density: declined Recommended yearly ophthalmology/optometry visit for glaucoma screening and checkup Recommended yearly dental visit for hygiene and checkup  Vaccinations: Influenza vaccine: had in October at local pharmacy Pneumococcal vaccine: n/d Tdap vaccine: n/d Shingles vaccine: n/d   Covid-19:08/03/20, 09/02/20, 10/?/22  Advanced directives: no  Conditions/risks identified: none  Next appointment: Follow up in one year for your annual wellness visit    Preventive Care 95 Years and Older, Female Preventive care refers to lifestyle choices and visits with your health care provider that can promote health and wellness. What does preventive care include? A yearly physical exam. This is also called an annual well check. Dental exams once or twice a year. Routine eye exams. Ask your health care provider how often you should have your eyes checked. Personal lifestyle choices, including: Daily care of your teeth and gums. Regular physical activity. Eating a healthy diet. Avoiding tobacco and drug use. Limiting alcohol use. Practicing safe sex. Taking low-dose aspirin every day. Taking vitamin and mineral supplements as recommended by your health care provider. What happens during an annual well check? The services and screenings done by your health care provider during your annual well check will depend on your age, overall health, lifestyle risk factors, and family history of disease. Counseling  Your health care provider may ask you questions about your: Alcohol use. Tobacco use. Drug use. Emotional well-being. Home and relationship well-being. Sexual  activity. Eating habits. History of falls. Memory and ability to understand (cognition). Work and work Statistician. Reproductive health. Screening  You may have the following tests or measurements: Height, weight, and BMI. Blood pressure. Lipid and cholesterol levels. These may be checked every 5 years, or more frequently if you are over 52 years old. Skin check. Lung cancer screening. You may have this screening every year starting at age 74 if you have a 30-pack-year history of smoking and currently smoke or have quit within the past 15 years. Fecal occult blood test (FOBT) of the stool. You may have this test every year starting at age 80. Flexible sigmoidoscopy or colonoscopy. You may have a sigmoidoscopy every 5 years or a colonoscopy every 10 years starting at age 60. Hepatitis C blood test. Hepatitis B blood test. Sexually transmitted disease (STD) testing. Diabetes screening. This is done by checking your blood sugar (glucose) after you have not eaten for a while (fasting). You may have this done every 1-3 years. Bone density scan. This is done to screen for osteoporosis. You may have this done starting at age 75. Mammogram. This may be done every 1-2 years. Talk to your health care provider about how often you should have regular mammograms. Talk with your health care provider about your test results, treatment options, and if necessary, the need for more tests. Vaccines  Your health care provider may recommend certain vaccines, such as: Influenza vaccine. This is recommended every year. Tetanus, diphtheria, and acellular pertussis (Tdap, Td) vaccine. You may need a Td booster every 10 years. Zoster vaccine. You may need this after age 38. Pneumococcal 13-valent conjugate (PCV13) vaccine. One dose is recommended after age 20. Pneumococcal polysaccharide (PPSV23) vaccine. One dose is recommended after age 38. Talk to your health  care provider about which screenings and vaccines  you need and how often you need them. This information is not intended to replace advice given to you by your health care provider. Make sure you discuss any questions you have with your health care provider. Document Released: 12/17/2015 Document Revised: 08/09/2016 Document Reviewed: 09/21/2015 Elsevier Interactive Patient Education  2017 [place] Prevention in the Home Falls can cause injuries. They can happen to people of all ages. There are many things you can do to make your home safe and to help prevent falls. What can I do on the outside of my home? Regularly fix the edges of walkways and driveways and fix any cracks. Remove anything that might make you trip as you walk through a door, such as a raised step or threshold. Trim any bushes or trees on the path to your home. Use bright outdoor lighting. Clear any walking paths of anything that might make someone trip, such as rocks or tools. Regularly check to see if handrails are loose or broken. Make sure that both sides of any steps have handrails. Any raised decks and porches should have guardrails on the edges. Have any leaves, snow, or ice cleared regularly. Use sand or salt on walking paths during winter. Clean up any spills in your garage right away. This includes oil or grease spills. What can I do in the bathroom? Use night lights. Install grab bars by the toilet and in the tub and shower. Do not use towel bars as grab bars. Use non-skid mats or decals in the tub or shower. If you need to sit down in the shower, use a plastic, non-slip stool. Keep the floor dry. Clean up any water that spills on the floor as soon as it happens. Remove soap buildup in the tub or shower regularly. Attach bath mats securely with double-sided non-slip rug tape. Do not have throw rugs and other things on the floor that can make you trip. What can I do in the bedroom? Use night lights. Make sure that you have a light by your bed that  is easy to reach. Do not use any sheets or blankets that are too big for your bed. They should not hang down onto the floor. Have a firm chair that has side arms. You can use this for support while you get dressed. Do not have throw rugs and other things on the floor that can make you trip. What can I do in the kitchen? Clean up any spills right away. Avoid walking on wet floors. Keep items that you use a lot in easy-to-reach places. If you need to reach something above you, use a strong step stool that has a grab bar. Keep electrical cords out of the way. Do not use floor polish or wax that makes floors slippery. If you must use wax, use non-skid floor wax. Do not have throw rugs and other things on the floor that can make you trip. What can I do with my stairs? Do not leave any items on the stairs. Make sure that there are handrails on both sides of the stairs and use them. Fix handrails that are broken or loose. Make sure that handrails are as long as the stairways. Check any carpeting to make sure that it is firmly attached to the stairs. Fix any carpet that is loose or worn. Avoid having throw rugs at the top or bottom of the stairs. If you do have throw rugs, attach them to  the floor with carpet tape. Make sure that you have a light switch at the top of the stairs and the bottom of the stairs. If you do not have them, ask someone to add them for you. What else can I do to help prevent falls? Wear shoes that: Do not have high heels. Have rubber bottoms. Are comfortable and fit you well. Are closed at the toe. Do not wear sandals. If you use a stepladder: Make sure that it is fully opened. Do not climb a closed stepladder. Make sure that both sides of the stepladder are locked into place. Ask someone to hold it for you, if possible. Clearly mark and make sure that you can see: Any grab bars or handrails. First and last steps. Where the edge of each step is. Use tools that help you  move around (mobility aids) if they are needed. These include: Canes. Walkers. Scooters. Crutches. Turn on the lights when you go into a dark area. Replace any light bulbs as soon as they burn out. Set up your furniture so you have a clear path. Avoid moving your furniture around. If any of your floors are uneven, fix them. If there are any pets around you, be aware of where they are. Review your medicines with your doctor. Some medicines can make you feel dizzy. This can increase your chance of falling. Ask your doctor what other things that you can do to help prevent falls. This information is not intended to replace advice given to you by your health care provider. Make sure you discuss any questions you have with your health care provider. Document Released: 09/16/2009 Document Revised: 04/27/2016 Document Reviewed: 12/25/2014 Elsevier Interactive Patient Education  2017 [employer].

## 2021-12-21 ENCOUNTER — Other Ambulatory Visit: Payer: Self-pay | Admitting: Family Medicine

## 2021-12-21 DIAGNOSIS — K219 Gastro-esophageal reflux disease without esophagitis: Secondary | ICD-10-CM

## 2021-12-21 DIAGNOSIS — I10 Essential (primary) hypertension: Secondary | ICD-10-CM

## 2021-12-21 DIAGNOSIS — J4520 Mild intermittent asthma, uncomplicated: Secondary | ICD-10-CM

## 2021-12-21 NOTE — Telephone Encounter (Signed)
Requested medications are due for refill today.  yes  Requested medications are on the active medications list.  yes  Last refill. 01/04/2021  Future visit scheduled.   yes  Notes to clinic.  Prescription expired 07/21/2021.    Requested Prescriptions  Pending Prescriptions Disp Refills   amLODipine (NORVASC) 10 MG tablet [Pharmacy Med Name: AMLODIPINE BESYLATE 10 MG TAB] 30 tablet 11    Sig: TAKE 1 TABLET BY MOUTH EVERY DAY     Cardiovascular:  Calcium Channel Blockers Passed - 12/21/2021  1:50 AM      Passed - Last BP in normal range    BP Readings from Last 1 Encounters:  08/03/21 116/60          Passed - Valid encounter within last 6 months    Recent Outpatient Visits           5 months ago Essential hypertension   [facility] [place], Dionne Bucy, MD   11 months ago Stage 3b chronic kidney disease [facility])   [place], Dionne Bucy, MD   1 year ago Encounter for Medicare annual wellness exam   [facility] [place], [place], LPN   1 year ago Essential hypertension   [facility] [place], Dionne Bucy, MD       Future Appointments             In 1 month Sharolyn Douglas, Clance Boll, NP [place], [facility]            Signed Prescriptions Disp Refills   omeprazole (PRILOSEC) 40 MG capsule 60 capsule 6    Sig: TAKE 1 CAPSULE BY MOUTH TWICE A DAY     Gastroenterology: Proton Pump Inhibitors Passed - 12/21/2021  1:50 AM      Passed - Valid encounter within last 12 months    Recent Outpatient Visits           5 months ago Essential hypertension   [facility] [place], Dionne Bucy, MD   11 months ago Stage 3b chronic kidney disease [facility])   [place], Dionne Bucy, MD   1 year ago Encounter for Medicare annual wellness exam   [facility] [place], [place], LPN   1 year ago Essential hypertension   [facility]  [place], Dionne Bucy, MD       Future Appointments             In 1 month Sharolyn Douglas, Clance Boll, NP [place], [facility]             montelukast (SINGULAIR) 10 MG tablet 30 tablet 6    Sig: TAKE 1 TABLET BY MOUTH EVERYDAY AT BEDTIME     Pulmonology:  Leukotriene Inhibitors Passed - 12/21/2021  1:50 AM      Passed - Valid encounter within last 12 months    Recent Outpatient Visits           5 months ago Essential hypertension   [place], Dionne Bucy, MD   11 months ago Stage 3b chronic kidney disease [facility])   [facility] [place], Dionne Bucy, MD   1 year ago Encounter for Medicare annual wellness exam   [facility] [place], [place], LPN   1 year ago Essential hypertension   [facility] [place], Dionne Bucy, MD       Future Appointments             In 1 month Murray Hodgkins  Jori Moll, NP [facility] [place], [facility]

## 2021-12-21 NOTE — Telephone Encounter (Signed)
Requested Prescriptions  Pending Prescriptions Disp Refills   omeprazole (PRILOSEC) 40 MG capsule [Pharmacy Med Name: OMEPRAZOLE DR 40 MG CAPSULE] 60 capsule 11    Sig: TAKE 1 CAPSULE BY MOUTH TWICE A DAY     Gastroenterology: Proton Pump Inhibitors Passed - 12/21/2021  1:50 AM      Passed - Valid encounter within last 12 months    Recent Outpatient Visits          5 months ago Essential hypertension   [facility] [place], Dionne Bucy, MD   11 months ago Stage 3b chronic kidney disease [facility])   [facility] [place], Dionne Bucy, MD   1 year ago Encounter for Medicare annual wellness exam   [facility] [place], [place], LPN   1 year ago Essential hypertension   [facility] [place], Dionne Bucy, MD      Future Appointments            In 1 month Sharolyn Douglas, Clance Boll, NP [place], [facility]            amLODipine ([place]) 10 MG tablet [Pharmacy Med Name: AMLODIPINE BESYLATE 10 MG TAB] 30 tablet 11    Sig: TAKE 1 TABLET BY MOUTH EVERY DAY     Cardiovascular:  Calcium Channel Blockers Passed - 12/21/2021  1:50 AM      Passed - Last BP in normal range    BP Readings from Last 1 Encounters:  08/03/21 116/60         Passed - Valid encounter within last 6 months    Recent Outpatient Visits          5 months ago Essential hypertension   [facility] [place], Dionne Bucy, MD   11 months ago Stage 3b chronic kidney disease [facility])   [place]igal[place], Dionne Bucy, MD   1 year ago Encounter for Medicare annual wellness exam   [facility] [place], [place], LPN   1 year ago Essential hypertension   [facility] [place], Dionne Bucy, MD      Future Appointments            In 1 month Sharolyn Douglas, Clance Boll, NP [place], [facility]            montelukast (SINGULAIR) 10 MG tablet [Pharmacy Med Name: MONTELUKAST  SOD 10 MG TABLET] 30 tablet 11    Sig: TAKE 1 TABLET BY MOUTH EVERYDAY AT BEDTIME     Pulmonology:  Leukotriene Inhibitors Passed - 12/21/2021  1:50 AM      Passed - Valid encounter within last 12 months    Recent Outpatient Visits          5 months ago Essential hypertension   [place], Dionne Bucy, MD   11 months ago Stage 3b chronic kidney disease [facility])   [facility] [place], Dionne Bucy, MD   1 year ago Encounter for Medicare annual wellness exam   [place], [place], LPN   1 year ago Essential hypertension   [facility] [place], Dionne Bucy, MD      Future Appointments            In 1 month Sharolyn Douglas, Clance Boll, NP [facility], [place]

## 2022-01-16 ENCOUNTER — Encounter: Payer: Self-pay | Admitting: Family Medicine

## 2022-01-16 DIAGNOSIS — I10 Essential (primary) hypertension: Secondary | ICD-10-CM

## 2022-01-18 MED ORDER — AMLODIPINE BESYLATE 10 MG PO TABS: 10.0000 mg | ORAL_TABLET | Freq: Every day | ORAL | 0 refills | Status: DC

## 2022-01-20 NOTE — Progress Notes (Signed)
Established patient visit   Patient: Andrea Hart   DOB: [birth year]   86 y.o. Female  MRN: [id number] Visit Date: 01/23/2022  Today's healthcare provider: Lavon Paganini, MD   Chief Complaint  Patient presents with   follow-up chronic disease   Subjective    HPI  AWV done on 11/29/21.   Hypertension, follow-up  BP Readings from Last 3 Encounters:  01/23/22 118/80  08/03/21 116/60  07/21/21 132/74   Wt Readings from Last 3 Encounters:  01/23/22 148 lb 4.8 oz (67.3 kg)  07/21/21 139 lb 9.6 oz (63.3 kg)  04/22/21 142 lb (64.4 kg)     She was last seen for hypertension 6 months ago.  BP at that visit was 116/60. Management since that visit includes no changes. She reports excellent compliance with treatment. She is not having side effects.  She is exercising. Walking. She is adherent to low salt diet.   Outside blood pressures are 120/70's.  She does not smoke.  Use of agents associated with hypertension: none.   --------------------------------------------------------------------------------------------------- Lipid/Cholesterol, follow-up  Last Lipid Panel: Lab Results  Component Value Date   CHOL 137 07/21/2021   LDLCALC 64 07/21/2021   HDL 50 07/21/2021   TRIG 128 07/21/2021    She was last seen for this 6 months ago.  Management since that visit includes no changes.  She reports excellent compliance with treatment. She is not having side effects.   Symptoms: No appetite changes No foot ulcerations  No chest pain No chest pressure/discomfort  No dyspnea No orthopnea  No fatigue No lower extremity edema  No palpitations No paroxysmal nocturnal dyspnea  No nausea No numbness or tingling of extremity  No polydipsia No polyuria  No speech difficulty No syncope   She is following a Regular diet. Current exercise: walking  Last metabolic panel Lab Results  Component Value Date   GLUCOSE 107 (H) 07/21/2021   NA 138 07/21/2021   K 4.5  07/21/2021   BUN 27 07/21/2021   CREATININE 1.40 (H) 07/21/2021   EGFR 38 (L) 07/21/2021   GFRNONAA 39 (L) 06/17/2021   CALCIUM 9.5 07/21/2021   AST 17 07/21/2021   ALT 12 07/21/2021   The ASCVD Risk score (Arnett DK, et al., 2019) failed to calculate for the following reasons:   The 2019 ASCVD risk score is only valid for ages 33 to 32  --------------------------------------------------------------------------------------------------- Hypothyroid, follow-up  Lab Results  Component Value Date   TSH 2.330 10/03/2021   TSH 0.316 (L) 07/21/2021   TSH 0.651 01/04/2021   T4TOTAL 10.5 10/28/2018    Wt Readings from Last 3 Encounters:  01/23/22 148 lb 4.8 oz (67.3 kg)  07/21/21 139 lb 9.6 oz (63.3 kg)  04/22/21 142 lb (64.4 kg)    She was last seen for hypothyroid 6 months ago.  Management since that visit includes continue rosuvastatin. She reports excellent compliance with treatment. She is not having side effects.   Symptoms: No change in energy level No constipation  No diarrhea Yes heat / cold intolerance  No nervousness No palpitations  No weight changes    ----------------------------------------------------------------------------------------- Frozen L shoulder - long term - does not want surgery  Medications: Outpatient Medications Prior to Visit  Medication Sig   aspirin EC 81 MG tablet Take 81 mg by mouth daily.   FeFum-FePoly-FA-B Cmp-C-Biot (INTEGRA PLUS) CAPS Take 1 capsule by mouth daily.   spironolactone (ALDACTONE) 25 MG tablet Take 0.5 tablets (12.5 mg total)  by mouth daily.   Turmeric 500 MG TABS Take 1,000 mg by mouth daily.   valsartan (DIOVAN) 320 MG tablet Take 1 tablet (320 mg total) by mouth daily.   [DISCONTINUED] albuterol (VENTOLIN HFA) 108 (90 Base) MCG/ACT inhaler Inhale 2 puffs into the lungs every 4 (four) hours as needed for wheezing or shortness of breath.   [DISCONTINUED] amLODipine (NORVASC) 10 MG tablet Take 1 tablet (10 mg total) by  mouth daily.   [DISCONTINUED] carvedilol (COREG) 12.5 MG tablet TAKE 1 TABLET BY MOUTH 2 TIMES DAILY.   [DISCONTINUED] cetirizine (ZYRTEC) 10 MG tablet Take 1 tablet (10 mg total) by mouth daily.   [DISCONTINUED] clopidogrel (PLAVIX) 75 MG tablet TAKE 1 TABLET BY MOUTH EVERY DAY   [DISCONTINUED] Fluticasone-Salmeterol (ADVAIR) 500-50 MCG/DOSE AEPB Inhale 1 puff into the lungs 2 (two) times daily.   [DISCONTINUED] levothyroxine (SYNTHROID) 75 MCG tablet Take 1 tablet (75 mcg total) by mouth daily before breakfast.   [DISCONTINUED] montelukast (SINGULAIR) 10 MG tablet TAKE 1 TABLET BY MOUTH EVERYDAY AT BEDTIME   [DISCONTINUED] omeprazole (PRILOSEC) 40 MG capsule TAKE 1 CAPSULE BY MOUTH TWICE A DAY   [DISCONTINUED] rosuvastatin (CRESTOR) 20 MG tablet TAKE 1 TABLET BY MOUTH EVERY DAY   [DISCONTINUED] chlorthalidone (HYGROTON) 25 MG tablet TAKE 1 TABLET (25 MG) BY MOUTH ONCE DAILY IN THE MORNING   No facility-administered medications prior to visit.    Review of Systems per HPI     Objective    BP 118/80 Comment: home reading   Pulse (!) 57    Temp (!) 97.5 F (36.4 C) (Oral)    Resp 16    Ht 5' (1.524 m)    Wt 148 lb 4.8 oz (67.3 kg)    BMI 28.96 kg/m  {Show previous vital signs (optional):23777}  Physical Exam Vitals reviewed.  Constitutional:      General: She is not in acute distress.    Appearance: Normal appearance. She is well-developed. She is not diaphoretic.  HENT:     Head: Normocephalic and atraumatic.  Eyes:     General: No scleral icterus.    Conjunctiva/sclera: Conjunctivae normal.  Neck:     Thyroid: No thyromegaly.  Cardiovascular:     Rate and Rhythm: Normal rate and regular rhythm.     Pulses: Normal pulses.     Heart sounds: Normal heart sounds. No murmur heard. Pulmonary:     Effort: Pulmonary effort is normal. No respiratory distress.     Breath sounds: Normal breath sounds. No wheezing, rhonchi or rales.  Musculoskeletal:     Cervical back: Neck supple.      Right lower leg: No edema.     Left lower leg: No edema.     Comments: Significantly limited range of motion L shoulder  Lymphadenopathy:     Cervical: No cervical adenopathy.  Skin:    General: Skin is warm and dry.     Findings: No rash.  Neurological:     Mental Status: She is alert and oriented to person, place, and time. Mental status is at baseline.  Psychiatric:        Mood and Affect: Mood normal.        Behavior: Behavior normal.      No results found for any visits on 01/23/22.  Assessment & Plan     Problem List Items Addressed This Visit       Cardiovascular and Mediastinum   Essential hypertension - Primary    Well controlled on home  readings Continue current medications Recheck metabolic panel F/u in 6 months       Relevant Medications   amLODipine (NORVASC) 10 MG tablet   carvedilol (COREG) 12.5 MG tablet   clopidogrel (PLAVIX) 75 MG tablet   rosuvastatin (CRESTOR) 20 MG tablet   Other Relevant Orders   Comprehensive metabolic panel   CAD (coronary artery disease)    Chronic and stable F/b cardiology No changes to medications      Relevant Medications   amLODipine (NORVASC) 10 MG tablet   carvedilol (COREG) 12.5 MG tablet   rosuvastatin (CRESTOR) 20 MG tablet   Other Relevant Orders   CBC     Respiratory   Mild intermittent asthma   Relevant Medications   albuterol (VENTOLIN HFA) 108 (90 Base) MCG/ACT inhaler   montelukast (SINGULAIR) 10 MG tablet   budesonide-formoterol (SYMBICORT) 160-4.5 MCG/ACT inhaler     Digestive   Gastroesophageal reflux disease without esophagitis   Relevant Medications   omeprazole (PRILOSEC) 40 MG capsule     Endocrine   Hypothyroidism    Previously well controlled Continue Synthroid at current dose  Recheck TSH and adjust Synthroid as indicated        Relevant Medications   carvedilol (COREG) 12.5 MG tablet   levothyroxine (SYNTHROID) 75 MCG tablet   Other Relevant Orders   TSH      Genitourinary   CKD (chronic kidney disease), stage III (HCC)    F/b Nephrology Chronic and stable Recheck metabolic panel Avoid nephrotoxic meds       Relevant Orders   Comprehensive metabolic panel   VITAMIN D 25 Hydroxy (Vit-D Deficiency, Fractures)   CBC     Other   Hyperlipidemia    Previously well controlled Continue statin Repeat FLP annually Goal LDL < 70      Relevant Medications   amLODipine (NORVASC) 10 MG tablet   carvedilol (COREG) 12.5 MG tablet   clopidogrel (PLAVIX) 75 MG tablet   rosuvastatin (CRESTOR) 20 MG tablet   Vitamin D deficiency   Relevant Orders   VITAMIN D 25 Hydroxy (Vit-D Deficiency, Fractures)   Prediabetes    Recommend low carb diet Recheck A1c      Relevant Orders   Hemoglobin A1c   Other Visit Diagnoses     Long-term use of high-risk medication       Relevant Orders   CBC   Hyperlipidemia LDL goal <70       Relevant Medications   amLODipine (NORVASC) 10 MG tablet   carvedilol (COREG) 12.5 MG tablet   clopidogrel (PLAVIX) 75 MG tablet   rosuvastatin (CRESTOR) 20 MG tablet   Adhesive capsulitis of left shoulder       Relevant Orders   Ambulatory referral to Sports Medicine        Return in about 6 months (around 07/23/2022) for chronic disease f/u.      I, Lavon Paganini, MD, have reviewed all documentation for this visit. The documentation on 01/23/22 for the exam, diagnosis, procedures, and orders are all accurate and complete.   Bacigalupo, Dionne Bucy, MD, MPH [facility]

## 2022-01-23 ENCOUNTER — Other Ambulatory Visit: Payer: Self-pay

## 2022-01-23 ENCOUNTER — Ambulatory Visit (INDEPENDENT_AMBULATORY_CARE_PROVIDER_SITE_OTHER): Payer: Medicare Other | Admitting: Family Medicine

## 2022-01-23 ENCOUNTER — Encounter: Payer: Self-pay | Admitting: Family Medicine

## 2022-01-23 VITALS — BP 118/80 | HR 57 | Temp 97.5°F | Resp 16 | Ht 60.0 in | Wt 148.3 lb

## 2022-01-23 DIAGNOSIS — M7502 Adhesive capsulitis of left shoulder: Secondary | ICD-10-CM | POA: Diagnosis not present

## 2022-01-23 DIAGNOSIS — Z79899 Other long term (current) drug therapy: Secondary | ICD-10-CM

## 2022-01-23 DIAGNOSIS — I10 Essential (primary) hypertension: Secondary | ICD-10-CM

## 2022-01-23 DIAGNOSIS — E039 Hypothyroidism, unspecified: Secondary | ICD-10-CM

## 2022-01-23 DIAGNOSIS — K219 Gastro-esophageal reflux disease without esophagitis: Secondary | ICD-10-CM | POA: Diagnosis not present

## 2022-01-23 DIAGNOSIS — E785 Hyperlipidemia, unspecified: Secondary | ICD-10-CM

## 2022-01-23 DIAGNOSIS — I2510 Atherosclerotic heart disease of native coronary artery without angina pectoris: Secondary | ICD-10-CM | POA: Diagnosis not present

## 2022-01-23 DIAGNOSIS — J4520 Mild intermittent asthma, uncomplicated: Secondary | ICD-10-CM | POA: Diagnosis not present

## 2022-01-23 DIAGNOSIS — N183 Chronic kidney disease, stage 3 unspecified: Secondary | ICD-10-CM

## 2022-01-23 DIAGNOSIS — E559 Vitamin D deficiency, unspecified: Secondary | ICD-10-CM

## 2022-01-23 DIAGNOSIS — R7303 Prediabetes: Secondary | ICD-10-CM | POA: Diagnosis not present

## 2022-01-23 MED ORDER — CLOPIDOGREL BISULFATE 75 MG PO TABS: 75.0000 mg | ORAL_TABLET | Freq: Every day | ORAL | 5 refills | Status: DC

## 2022-01-23 MED ORDER — BUDESONIDE-FORMOTEROL FUMARATE 160-4.5 MCG/ACT IN AERO: 2.0000 | INHALATION_SPRAY | Freq: Two times a day (BID) | RESPIRATORY_TRACT | 3 refills | Status: DC

## 2022-01-23 MED ORDER — ALBUTEROL SULFATE HFA 108 (90 BASE) MCG/ACT IN AERS: 2.0000 | INHALATION_SPRAY | RESPIRATORY_TRACT | 11 refills | Status: DC | PRN

## 2022-01-23 MED ORDER — ROSUVASTATIN CALCIUM 20 MG PO TABS: 20.0000 mg | ORAL_TABLET | Freq: Every day | ORAL | 5 refills | Status: DC

## 2022-01-23 MED ORDER — AMLODIPINE BESYLATE 10 MG PO TABS: 10.0000 mg | ORAL_TABLET | Freq: Every day | ORAL | 5 refills | Status: DC

## 2022-01-23 MED ORDER — CETIRIZINE HCL 10 MG PO TABS: 10.0000 mg | ORAL_TABLET | Freq: Every day | ORAL | 3 refills | Status: DC

## 2022-01-23 MED ORDER — OMEPRAZOLE 40 MG PO CPDR: 40.0000 mg | DELAYED_RELEASE_CAPSULE | Freq: Two times a day (BID) | ORAL | 5 refills | Status: DC

## 2022-01-23 MED ORDER — CARVEDILOL 12.5 MG PO TABS: 12.5000 mg | ORAL_TABLET | Freq: Two times a day (BID) | ORAL | 5 refills | Status: DC

## 2022-01-23 MED ORDER — LEVOTHYROXINE SODIUM 75 MCG PO TABS: 75.0000 ug | ORAL_TABLET | Freq: Every day | ORAL | 5 refills | Status: DC

## 2022-01-23 MED ORDER — MONTELUKAST SODIUM 10 MG PO TABS: 10.0000 mg | ORAL_TABLET | Freq: Every day | ORAL | 5 refills | Status: DC

## 2022-01-23 NOTE — Assessment & Plan Note (Signed)
Recommend low carb diet °Recheck A1c  °

## 2022-01-23 NOTE — Assessment & Plan Note (Addendum)
Previously well controlled Continue statin Repeat FLP annually Goal LDL < 70

## 2022-01-23 NOTE — Assessment & Plan Note (Signed)
Previously well controlled Continue Synthroid at current dose  Recheck TSH and adjust Synthroid as indicated   

## 2022-01-23 NOTE — Assessment & Plan Note (Signed)
Chronic and stable F/b cardiology No changes to medications

## 2022-01-23 NOTE — Assessment & Plan Note (Addendum)
F/b Nephrology Chronic and stable Recheck metabolic panel Avoid nephrotoxic meds

## 2022-01-23 NOTE — Assessment & Plan Note (Signed)
Well controlled on home readings Continue current medications Recheck metabolic panel F/u in 6 months  

## 2022-01-24 LAB — COMPREHENSIVE METABOLIC PANEL
ALT: 10 IU/L (ref 0–32)
AST: 17 IU/L (ref 0–40)
Albumin/Globulin Ratio: 1.8 (ref 1.2–2.2)
Albumin: 4.4 g/dL (ref 3.6–4.6)
Alkaline Phosphatase: 68 IU/L (ref 44–121)
BUN/Creatinine Ratio: 18 (ref 12–28)
BUN: 24 mg/dL (ref 8–27)
Bilirubin Total: 0.3 mg/dL (ref 0.0–1.2)
CO2: 19 mmol/L — ABNORMAL LOW (ref 20–29)
Calcium: 9.3 mg/dL (ref 8.7–10.3)
Chloride: 107 mmol/L — ABNORMAL HIGH (ref 96–106)
Creatinine, Ser: 1.32 mg/dL — ABNORMAL HIGH (ref 0.57–1.00)
Globulin, Total: 2.5 g/dL (ref 1.5–4.5)
Glucose: 107 mg/dL — ABNORMAL HIGH (ref 70–99)
Potassium: 4.6 mmol/L (ref 3.5–5.2)
Sodium: 139 mmol/L (ref 134–144)
Total Protein: 6.9 g/dL (ref 6.0–8.5)
eGFR: 40 mL/min/{1.73_m2} — ABNORMAL LOW (ref >59)

## 2022-01-24 LAB — CBC
Hematocrit: 32.9 % — ABNORMAL LOW (ref 34.0–46.6)
Hemoglobin: 11.2 g/dL (ref 11.1–15.9)
MCH: 30.2 pg (ref 26.6–33.0)
MCHC: 34 g/dL (ref 31.5–35.7)
MCV: 89 fL (ref 79–97)
Platelets: 273 10*3/uL (ref 150–450)
RBC: 3.71 x10E6/uL — ABNORMAL LOW (ref 3.77–5.28)
RDW: 12.6 % (ref 11.7–15.4)
WBC: 7.1 10*3/uL (ref 3.4–10.8)

## 2022-01-24 LAB — HEMOGLOBIN A1C
Est. average glucose Bld gHb Est-mCnc: 126 mg/dL
Hgb A1c MFr Bld: 6 % — ABNORMAL HIGH (ref 4.8–5.6)

## 2022-01-24 LAB — VITAMIN D 25 HYDROXY (VIT D DEFICIENCY, FRACTURES): Vit D, 25-Hydroxy: 42.8 ng/mL (ref 30.0–100.0)

## 2022-01-24 LAB — TSH: TSH: 2.21 u[IU]/mL (ref 0.450–4.500)

## 2022-01-26 ENCOUNTER — Telehealth: Payer: Self-pay | Admitting: Family Medicine

## 2022-01-26 NOTE — Telephone Encounter (Signed)
Patient called requesting an update for referral placed on 01/23/2022. Please call patient with update.

## 2022-01-26 NOTE — Telephone Encounter (Signed)
Pt called in about Dr Sherene Sires scheduling her with Dr Zigmund Daniel for her shoulder. Please call back or would you like pec to schedule?

## 2022-02-01 ENCOUNTER — Other Ambulatory Visit: Payer: Self-pay

## 2022-02-01 ENCOUNTER — Encounter: Payer: Self-pay | Admitting: Nurse Practitioner

## 2022-02-01 ENCOUNTER — Ambulatory Visit (INDEPENDENT_AMBULATORY_CARE_PROVIDER_SITE_OTHER): Payer: Medicare Other | Admitting: Nurse Practitioner

## 2022-02-01 VITALS — BP 118/60 | HR 51 | Ht 62.0 in | Wt 146.0 lb

## 2022-02-01 DIAGNOSIS — R0989 Other specified symptoms and signs involving the circulatory and respiratory systems: Secondary | ICD-10-CM

## 2022-02-01 DIAGNOSIS — I10 Essential (primary) hypertension: Secondary | ICD-10-CM

## 2022-02-01 DIAGNOSIS — E785 Hyperlipidemia, unspecified: Secondary | ICD-10-CM | POA: Diagnosis not present

## 2022-02-01 DIAGNOSIS — I6523 Occlusion and stenosis of bilateral carotid arteries: Secondary | ICD-10-CM

## 2022-02-01 DIAGNOSIS — I2510 Atherosclerotic heart disease of native coronary artery without angina pectoris: Secondary | ICD-10-CM

## 2022-02-01 DIAGNOSIS — N183 Chronic kidney disease, stage 3 unspecified: Secondary | ICD-10-CM

## 2022-02-01 DIAGNOSIS — I493 Ventricular premature depolarization: Secondary | ICD-10-CM | POA: Diagnosis not present

## 2022-02-01 MED ORDER — VALSARTAN 320 MG PO TABS: 320.0000 mg | ORAL_TABLET | Freq: Every day | ORAL | 5 refills | Status: DC

## 2022-02-01 MED ORDER — SPIRONOLACTONE 25 MG PO TABS: 12.5000 mg | ORAL_TABLET | Freq: Every day | ORAL | 5 refills | Status: DC

## 2022-02-01 NOTE — Progress Notes (Signed)
Office Visit    Patient Name: Andrea Hart Date of Encounter: 02/01/2022  Primary Care Provider:  Virginia Crews, MD Primary Cardiologist:  Kathlyn Sacramento, MD  Chief Complaint    86 year old female with a history of CAD status post prior stenting in 2013, hypertension, hyperlipidemia, hypothyroidism, CKD 3, PVCs, asthma, and prior stroke x2, who presents for follow-up related to CAD and hypertension.  Past Medical History    Past Medical History:  Diagnosis Date   Abdominal hernia    Arthritis    Asthma    Carotid arterial disease ([place])    a. 11/2020 Carotid U/S: Less than 02% RICA, 77-41% LICA.   CKD (chronic kidney disease), stage III ([place])    Coronary artery disease    a. 2013 s/p PCI and stent placement in [facility] done for stable angina.   GI bleeding 02/02/2021   Hyperlipidemia    Hypertension    Hypothyroidism    PVC's (premature ventricular contractions)    a. 01/2020 Zio: RSR, 70, frequent PVCs with 10.5% burden.   Stroke [facility])    a. 2017 - ILR did not show afib.   Past Surgical History:  Procedure Laterality Date   ABDOMINAL HYSTERECTOMY     APPENDECTOMY     CATARACT EXTRACTION Bilateral    CHOLECYSTECTOMY     COLONOSCOPY WITH PROPOFOL N/A 04/06/2021   Procedure: COLONOSCOPY WITH PROPOFOL;  Surgeon: Toledo, Benay Pike, MD;  Location: [facility] ENDOSCOPY;  Service: Gastroenterology;  Laterality: N/A;   CORONARY ANGIOPLASTY  2013   1xStent MUSC [place].   EYE SURGERY     HAND SURGERY Right    LOOP RECORDER IMPLANT     NISSEN FUNDOPLICATION  2878   REPLACEMENT TOTAL KNEE Bilateral 2017   TOTAL HIP ARTHROPLASTY Right    TOTAL VAGINAL HYSTERECTOMY      Allergies  Allergies  Allergen Reactions   Lidocaine Rash    History of Present Illness    86 year old female with above past medical history including CAD status post prior stenting in 2013, hypertension, hyperlipidemia, stage III chronic kidney disease, PVCs, hypothyroidism,  asthma, and prior stroke x2.  She previously underwent stenting of an unknown vessel in [place], [place] in 2013.  In 2017, she suffered a stroke.  Implantable loop recorder did not show any atrial fibrillation, and she has been maintained on aspirin and Plavix ever since.  In February 2017, she complained of presyncope and underwent event monitoring.  This showed frequent PVCs with a 10% burden.  Patient reported a long history of PVCs.  She has been managed with beta-blocker therapy.  She has a history of difficult to control hypertension with renal artery duplex in August 2021 showing no evidence of stenosis.  Carotid ultrasound in December 2021 showed moderate, nonobstructive bilateral left greater than right disease.  In March 2022, she was admitted with rectal bleeding and stable H&H.  GI felt the presentation was most consistent with acute diverticular bleed.  Aspirin and Plavix were held, and she underwent outpatient colonoscopy on Apr 06, 2021, showing nonbleeding internal hemorrhoids, moderate diverticulosis without evidence of diverticular bleeding, and a 5 mm polyp.  She subsequently resumed aspirin and Plavix with stable hemoglobins and hematocrits.  Ms. Chalker was last seen in cardiology clinic in August 2022, at which time she was feeling well.  She reported improvement in blood pressures following addition of low-dose spironolactone.  Since her last visit, she has continued to do well.  She remains  active with multiple social events.  She does not routinely exercise.  She denies chest pain, dyspnea, palpitations, PND, orthopnea, dizziness, syncope, edema, claudication, or early satiety.  She noted recently that blood pressures were trending higher at home, frequently in the 150s.  Her blood pressure today is normal at 118/60, and she is going to look into buying a new home machine.  Home Medications    Current Outpatient Medications  Medication Sig Dispense Refill   albuterol (VENTOLIN  HFA) 108 (90 Base) MCG/ACT inhaler Inhale 2 puffs into the lungs every 4 (four) hours as needed for wheezing or shortness of breath. 6.7 g 11   amLODipine (NORVASC) 10 MG tablet Take 1 tablet (10 mg total) by mouth daily. 30 tablet 5   aspirin EC 81 MG tablet Take 81 mg by mouth daily.     budesonide-formoterol (SYMBICORT) 160-4.5 MCG/ACT inhaler Inhale 2 puffs into the lungs 2 (two) times daily. 3 each 3   carvedilol (COREG) 12.5 MG tablet Take 1 tablet (12.5 mg total) by mouth 2 (two) times daily. 60 tablet 5   cetirizine (ZYRTEC) 10 MG tablet Take 1 tablet (10 mg total) by mouth daily. 90 tablet 3   clopidogrel (PLAVIX) 75 MG tablet Take 1 tablet (75 mg total) by mouth daily. 30 tablet 5   levothyroxine (SYNTHROID) 75 MCG tablet Take 1 tablet (75 mcg total) by mouth daily before breakfast. 30 tablet 5   montelukast (SINGULAIR) 10 MG tablet Take 1 tablet (10 mg total) by mouth at bedtime. 30 tablet 5   omeprazole (PRILOSEC) 40 MG capsule Take 1 capsule (40 mg total) by mouth 2 (two) times daily. 60 capsule 5   rosuvastatin (CRESTOR) 20 MG tablet Take 1 tablet (20 mg total) by mouth daily. 30 tablet 5   Turmeric 500 MG TABS Take 1,000 mg by mouth daily.     FeFum-FePoly-FA-B Cmp-C-Biot (INTEGRA PLUS) CAPS Take 1 capsule by mouth daily. (Patient not taking: Reported on 02/01/2022) 30 capsule 1   spironolactone (ALDACTONE) 25 MG tablet Take 0.5 tablets (12.5 mg total) by mouth daily. 15 tablet 5   valsartan (DIOVAN) 320 MG tablet Take 1 tablet (320 mg total) by mouth daily. 30 tablet 5   No current facility-administered medications for this visit.     Review of Systems    She denies chest pain, palpitations, dyspnea, pnd, orthopnea, n, v, dizziness, syncope, edema, weight gain, claudication, or early satiety. .  All other systems reviewed and are otherwise negative except as noted above.    Physical Exam    VS:  BP 118/60 (BP Location: Left Arm, Patient Position: Sitting, Cuff Size: Normal)     Pulse (!) 51    Ht 5\' 2"  (1.575 m)    Wt 146 lb (66.2 kg)    SpO2 95%    BMI 26.70 kg/m  , BMI Body mass index is 26.7 kg/m.     GEN: Well nourished, well developed, in no acute distress. HEENT: normal. Neck: Supple, no JVD or masses.  Left carotid bruit noted. Cardiac: RRR, no murmurs, rubs, or gallops. No clubbing, cyanosis, edema.  Radials/PT 2+ and equal bilaterally.  Respiratory:  Respirations regular and unlabored, clear to auscultation bilaterally. GI: Soft, nontender, nondistended, BS + x 4. MS: no deformity or atrophy. Skin: warm and dry, no rash. Neuro:  Strength and sensation are intact. Psych: Normal affect.  Accessory Clinical Findings    ECG personally reviewed by me today -sinus bradycardia, 51 - no acute changes.  Lab Results  Component Value Date   WBC 7.1 01/23/2022   HGB 11.2 01/23/2022   HCT 32.9 (L) 01/23/2022   MCV 89 01/23/2022   PLT 273 01/23/2022   Lab Results  Component Value Date   CREATININE 1.32 (H) 01/23/2022   BUN 24 01/23/2022   NA 139 01/23/2022   K 4.6 01/23/2022   CL 107 (H) 01/23/2022   CO2 19 (L) 01/23/2022   Lab Results  Component Value Date   ALT 10 01/23/2022   AST 17 01/23/2022   ALKPHOS 68 01/23/2022   BILITOT 0.3 01/23/2022   Lab Results  Component Value Date   CHOL 137 07/21/2021   HDL 50 07/21/2021   LDLCALC 64 07/21/2021   TRIG 128 07/21/2021   CHOLHDL 2.7 07/21/2021    Lab Results  Component Value Date   HGBA1C 6.0 (H) 01/23/2022    Assessment & Plan    1.  Coronary artery disease: Status post remote stenting in 2013.  She remains active without chest pain or dyspnea.  She is hemodynamically stable.  Continue aspirin, beta-blocker, Plavix, and statin therapy.  2.  Essential hypertension: She is recently noted some higher blood pressures at home, sometimes into the 150s however, her blood pressures of 118/60 today.  She is going to look into getting a new cuff for home use.  She remains on carvedilol,  amlodipine, valsartan, and spironolactone.  Refilling the latter 2 today.  She recently had labs on February 20 which showed stable creatinine and potassium.  3.  Hyperlipidemia: LDL of 64 in August 2022 with normal LFTs last week.  Continue current dose of rosuvastatin therapy.  4.  Stage III chronic kidney disease: Creatinine stable at 1.32 on February 20.  She remains on ARB therapy.  5.  History of GI bleed/iron deficiency anemia: H&H stable last week at 11.2/32.9.  6.  PVCs: Quiescent on beta-blocker therapy.  7.  Carotid arterial disease: 50 to 69% left internal carotid artery stenosis on ultrasound of December 2021, with less than 50% on the right.  Left carotid bruit noted.  She is due for follow-up ultrasound and I will arrange today.  Continue aspirin, Plavix, and statin therapy.  8.  History of stroke: On aspirin, Plavix, and statin.  9.  Disposition: Follow-up carotid ultrasound.  Follow-up in clinic in 6 months or sooner if necessary.   Murray Hodgkins, NP 02/01/2022, 10:51 AM

## 2022-02-01 NOTE — Patient Instructions (Addendum)
Medication Instructions:  ?Your physician recommends that you continue on your current medications as directed. Please refer to the Current Medication list given to you today. ? ?Valsartan and Spironolactone have been refilled today. ? ?*If you need a refill on your cardiac medications before your next appointment, please call your pharmacy* ? ? ?Lab Work: ?None ordered ?If you have labs (blood work) drawn today and your tests are completely normal, you will receive your results only by: ?MyChart Message (if you have MyChart) OR ?A paper copy in the mail ?If you have any lab test that is abnormal or we need to change your treatment, we will call you to review the results. ? ? ?Testing/Procedures: ?Your physician has requested that you have a carotid duplex. This test is an ultrasound of the carotid arteries in your neck. It looks at blood flow through these arteries that supply the brain with blood. Allow one hour for this exam. There are no restrictions or special instructions.  ? ? ?Follow-Up: ?At [facility], you and your health needs are our priority.  As part of our continuing mission to provide you with exceptional heart care, we have created designated Provider Care Teams.  These Care Teams include your primary Cardiologist (physician) and Advanced Practice Providers (APPs -  Physician Assistants and Nurse Practitioners) who all work together to provide you with the care you need, when you need it. ? ?We recommend signing up for the patient portal called "MyChart".  Sign up information is provided on this After Visit Summary.  MyChart is used to connect with patients for Virtual Visits (Telemedicine).  Patients are able to view lab/test results, encounter notes, upcoming appointments, etc.  Non-urgent messages can be sent to your provider as well.   ?To learn more about what you can do with MyChart, go to [email or web address].   ? ?Your next appointment:   ?6 month(s) ? ?The format for your next  appointment:   ?In Person ? ?Provider:   ?You may see Kathlyn Sacramento, MD or one of the following Advanced Practice Providers on your designated Care Team:   ?Murray Hodgkins, NP ?Christell Faith, PA-C ?Cadence Kathlen Mody, PA-C}  ? ? ?Other Instructions ?N/A ? ?

## 2022-02-02 DIAGNOSIS — H43813 Vitreous degeneration, bilateral: Secondary | ICD-10-CM | POA: Diagnosis not present

## 2022-02-07 ENCOUNTER — Other Ambulatory Visit: Payer: Self-pay | Admitting: Family Medicine

## 2022-02-07 DIAGNOSIS — J4520 Mild intermittent asthma, uncomplicated: Secondary | ICD-10-CM

## 2022-02-07 NOTE — Telephone Encounter (Signed)
Patient's insurance is requiring a PA for albuterol HFA. ?The patient's drug benefit plan provides coverage for other drugs which may be considered for treating your patient. Can your patient's treatment be switched to a formulary drug? [If yes, provide your patient with a new prescription for the formulary product.] Available Formulary Alternatives: albuterol sulfate CFC-free aerosol (except [place] [id number]), levalbuterol tartrate CFC-free aerosol ? ?Please advise if medication can be changes or if PA needs to be completed.  ?

## 2022-02-10 ENCOUNTER — Encounter: Payer: Medicare Other | Admitting: Family Medicine

## 2022-02-10 ENCOUNTER — Encounter: Payer: Self-pay | Admitting: Emergency Medicine

## 2022-02-10 ENCOUNTER — Emergency Department
Admission: EM | Admit: 2022-02-10 | Discharge: 2022-02-10 | Disposition: A | Discharge status: Home / Self Care | Payer: Medicare Other | Attending: Emergency Medicine | Admitting: Emergency Medicine

## 2022-02-10 ENCOUNTER — Ambulatory Visit
Admission: EM | Admit: 2022-02-10 | Discharge: 2022-02-10 | Payer: Medicare Other | Attending: Emergency Medicine | Admitting: Emergency Medicine

## 2022-02-10 ENCOUNTER — Ambulatory Visit: Payer: Self-pay

## 2022-02-10 ENCOUNTER — Other Ambulatory Visit: Payer: Self-pay

## 2022-02-10 ENCOUNTER — Emergency Department: Payer: Medicare Other

## 2022-02-10 DIAGNOSIS — I639 Cerebral infarction, unspecified: Secondary | ICD-10-CM | POA: Diagnosis not present

## 2022-02-10 DIAGNOSIS — N189 Chronic kidney disease, unspecified: Secondary | ICD-10-CM | POA: Diagnosis not present

## 2022-02-10 DIAGNOSIS — R110 Nausea: Secondary | ICD-10-CM | POA: Diagnosis not present

## 2022-02-10 DIAGNOSIS — R42 Dizziness and giddiness: Secondary | ICD-10-CM | POA: Insufficient documentation

## 2022-02-10 DIAGNOSIS — I2510 Atherosclerotic heart disease of native coronary artery without angina pectoris: Secondary | ICD-10-CM | POA: Diagnosis not present

## 2022-02-10 DIAGNOSIS — I6782 Cerebral ischemia: Secondary | ICD-10-CM | POA: Diagnosis not present

## 2022-02-10 DIAGNOSIS — I129 Hypertensive chronic kidney disease with stage 1 through stage 4 chronic kidney disease, or unspecified chronic kidney disease: Secondary | ICD-10-CM | POA: Insufficient documentation

## 2022-02-10 LAB — CBC
HCT: 38 % (ref 36.0–46.0)
Hemoglobin: 12.1 g/dL (ref 12.0–15.0)
MCH: 29.3 pg (ref 26.0–34.0)
MCHC: 31.8 g/dL (ref 30.0–36.0)
MCV: 92 fL (ref 80.0–100.0)
Platelets: 278 10*3/uL (ref 150–400)
RBC: 4.13 MIL/uL (ref 3.87–5.11)
RDW: 12.6 % (ref 11.5–15.5)
WBC: 12.5 10*3/uL — ABNORMAL HIGH (ref 4.0–10.5)
nRBC: 0 % (ref 0.0–0.2)

## 2022-02-10 LAB — BASIC METABOLIC PANEL
Anion gap: 8 (ref 5–15)
BUN: 24 mg/dL — ABNORMAL HIGH (ref 8–23)
CO2: 21 mmol/L — ABNORMAL LOW (ref 22–32)
Calcium: 9.6 mg/dL (ref 8.9–10.3)
Chloride: 106 mmol/L (ref 98–111)
Creatinine, Ser: 1.21 mg/dL — ABNORMAL HIGH (ref 0.44–1.00)
GFR, Estimated: 44 mL/min — ABNORMAL LOW (ref >60)
Glucose, Bld: 183 mg/dL — ABNORMAL HIGH (ref 70–99)
Potassium: 4.4 mmol/L (ref 3.5–5.1)
Sodium: 135 mmol/L (ref 135–145)

## 2022-02-10 IMAGING — MR MR HEAD W/O CM
12 series · 48 of 48 positions shown · non-contrast
Comparison: [DATE]

CLINICAL DATA: Dizziness, persistent/recurrent, cardiac or vascular
cause suspected

EXAM:
MRI HEAD WITHOUT CONTRAST
TECHNIQUE: Multiplanar, multiecho pulse sequences of the brain and surrounding
structures were obtained without intravenous contrast.

[Series 5: ax dwi_tracew · axial · 3.0mm · 1.80mm/px · z∈[-42,+111]mm · 4 of 48 slices shown]
[im 1/48]
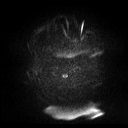
[im 16/48]
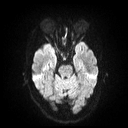
[im 32/48]
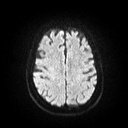
[im 48/48]
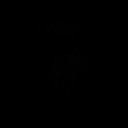

[Series 6: ax dwi_adc · axial · 3.0mm · 1.80mm/px · z∈[-42,+108]mm · 4 of 47 slices shown]
[im 1/47]
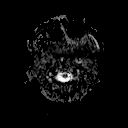
[im 16/47]
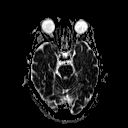
[im 31/47]
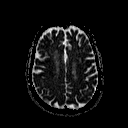
[im 47/47]
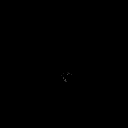

[Series 7: cor dwi_tracew · coronal · 5.0mm · 1.80mm/px · 3 of 42 slices shown]
[im 1/42]
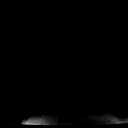
[im 21/42]
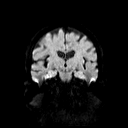
[im 42/42]
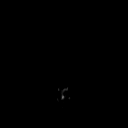

[Series 8: cor dwi_adc · coronal · 5.0mm · 1.80mm/px · 3 of 42 slices shown]
[im 1/42]
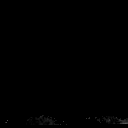
[im 21/42]
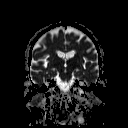
[im 42/42]
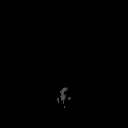

[Series 9: T1 · sagittal · 5.0mm · 0.62mm/px · 2 of 21 slices shown (1 of 2)]
[im 1/21]
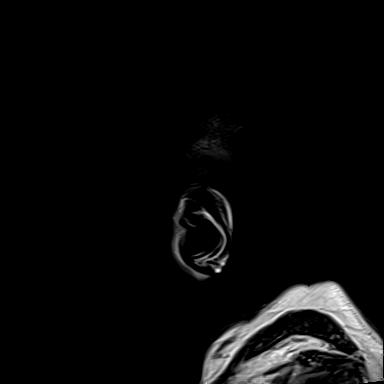
[im 21/21]
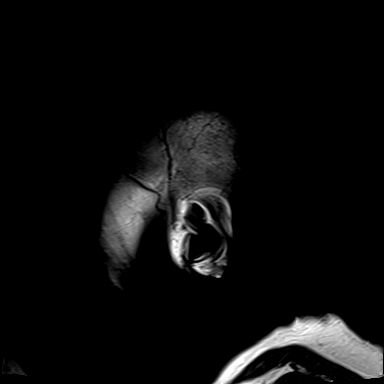

[Series 10: T2 · axial · 5.0mm · 0.86mm/px · z∈[-49,+105]mm · 2 of 27 slices shown (1 of 2)]
[im 1/27]
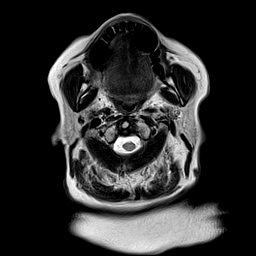
[im 27/27]
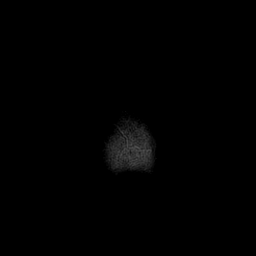

[Series 11: mag_images · axial · 3.0mm · 0.90mm/px · z∈[-48,+103]mm · 4 of 52 slices shown]
[im 1/52]
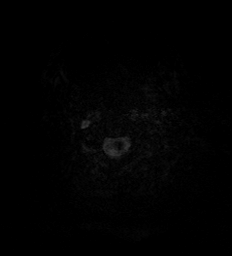
[im 18/52]
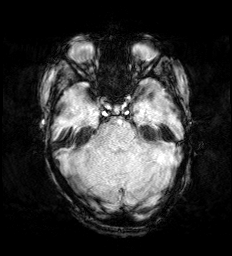
[im 35/52]
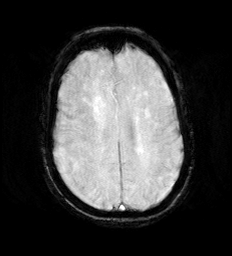
[im 52/52]
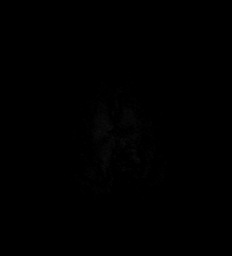

[Series 12: pha_images · axial · 3.0mm · 0.90mm/px · z∈[-48,+103]mm · 4 of 52 slices shown]
[im 1/52]
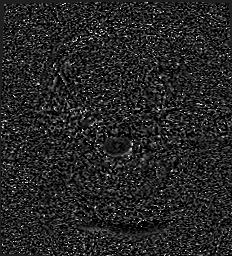
[im 18/52]
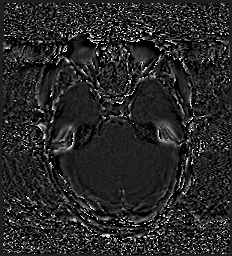
[im 35/52]
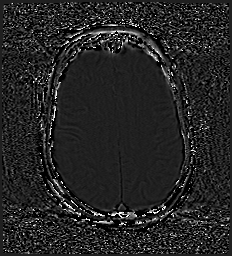
[im 52/52]
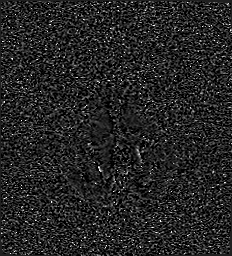

[Series 13: swi_images · axial · 3.0mm · 0.90mm/px · z∈[-48,+103]mm · 4 of 52 slices shown]
[im 1/52]
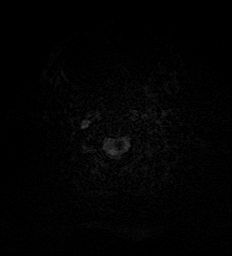
[im 18/52]
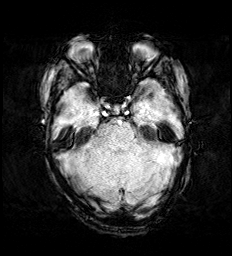
[im 35/52]
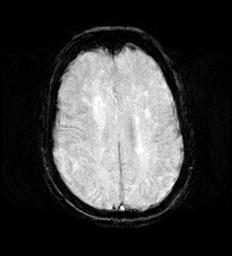
[im 52/52]
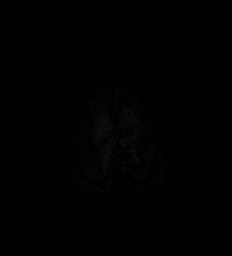

[Series 15: FLAIR · axial · 3.0mm · 0.69mm/px · z∈[-52,+108]mm · 4 of 55 slices shown]
[im 1/55]
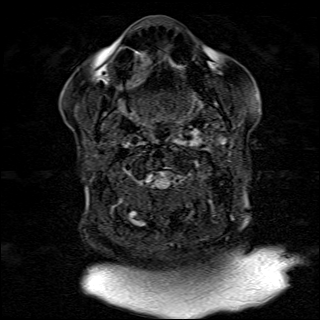
[im 19/55]
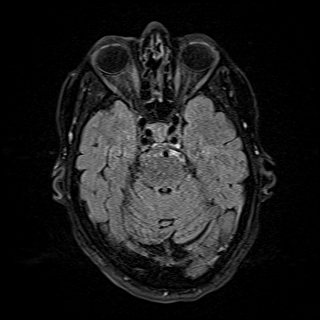
[im 37/55]
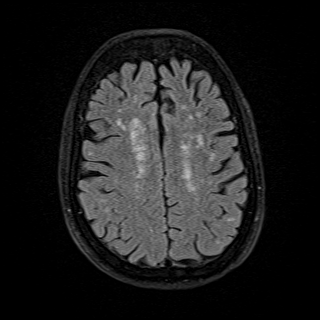
[im 55/55]
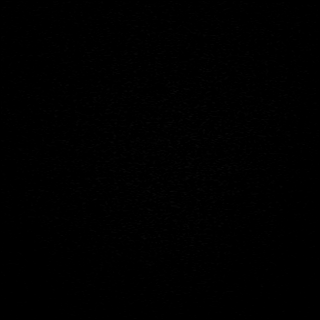

[Series 16: T1 · axial · 1.0mm · 0.98mm/px · z∈[-44,+113]mm · 12 of 160 slices shown (2 of 2)]
[im 1/160]
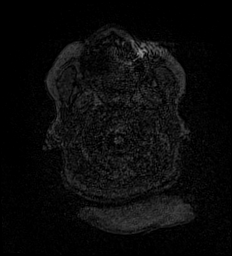
[im 15/160]
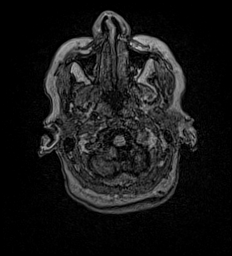
[im 29/160]
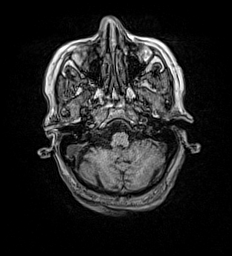
[im 44/160]
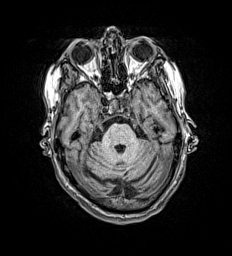
[im 58/160]
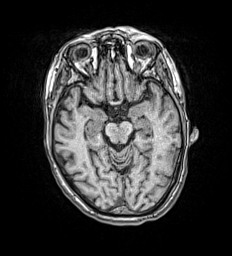
[im 73/160]
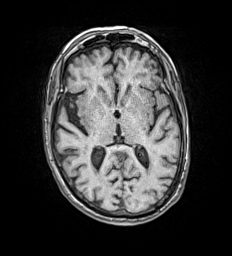
[im 87/160]
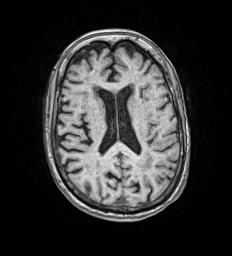
[im 102/160]
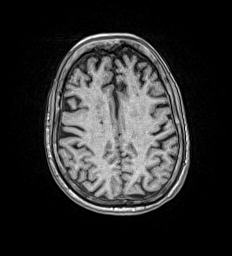
[im 116/160]
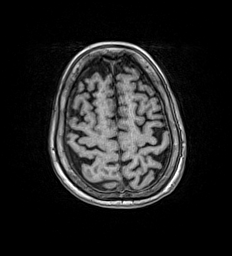
[im 131/160]
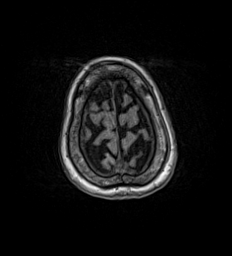
[im 145/160]
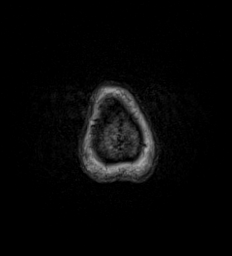
[im 160/160]
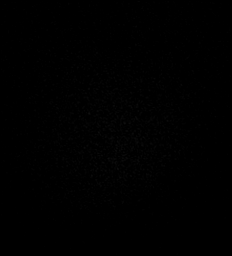

[Series 17: T2 · coronal · 5.0mm · 0.86mm/px · 2 of 31 slices shown (2 of 2)]
[im 1/31]
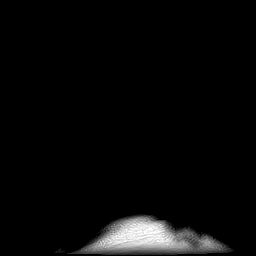
[im 31/31]
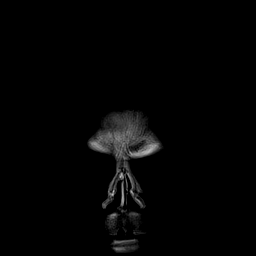

[48 of 48 positions shown; findings below may reference images not displayed]

FINDINGS: Brain: There is no acute infarction or intracranial hemorrhage.
There is no intracranial mass, mass effect, or edema. There is no
hydrocephalus or extra-axial fluid collection. Prominence of the
ventricles and sulci reflects stable mild parenchymal volume loss.
Patchy and confluent areas of T2 hyperintensity in the
supratentorial white matter are nonspecific but may reflect stable
moderate chronic microvascular ischemic changes. Small chronic left
thalamic infarct.

Vascular: Major vessel flow voids at the skull base are preserved.

Skull and upper cervical spine: Normal marrow signal is preserved.

Sinuses/Orbits: Paranasal sinuses are aerated. Bilateral lens
replacements.

Other: Sella is unremarkable.  Mastoid air cells are clear.
IMPRESSION: No acute infarct, hemorrhage, or mass. Stable chronic microvascular
ischemic changes.

## 2022-02-10 MED ORDER — MECLIZINE HCL 25 MG PO TABS
25.0000 mg | ORAL_TABLET | Freq: Once | ORAL | Status: AC
Start: 2022-02-10 — End: 2022-02-10
  Administered 2022-02-10: 25 mg via ORAL
  Filled 2022-02-10: qty 1

## 2022-02-10 MED ORDER — SODIUM CHLORIDE 0.9 % IV BOLUS
500.0000 mL | Freq: Once | INTRAVENOUS | Status: AC
  Administered 2022-02-10: 500 mL via INTRAVENOUS

## 2022-02-10 MED ORDER — MECLIZINE HCL 25 MG PO TABS: 12.5000 mg | ORAL_TABLET | Freq: Three times a day (TID) | ORAL | 0 refills | Status: DC | PRN

## 2022-02-10 MED ORDER — LORAZEPAM 2 MG/ML IJ SOLN
0.5000 mg | Freq: Once | INTRAMUSCULAR | Status: AC
  Administered 2022-02-10: 0.5 mg via INTRAVENOUS
  Filled 2022-02-10: qty 1

## 2022-02-10 MED ORDER — ONDANSETRON 4 MG PO TBDP
4.0000 mg | ORAL_TABLET | Freq: Once | ORAL | Status: AC
  Administered 2022-02-10: 4 mg via ORAL

## 2022-02-10 NOTE — Telephone Encounter (Signed)
Pt's daughter called, left VM to call back to discuss symptoms with a nurse and advised that office had appts this evening or if she wanted to proceed with UC that was fine too. ?

## 2022-02-10 NOTE — Telephone Encounter (Signed)
Pt's daughter had called into Agent line and spoke with Anderson Malta. She was stating that pt had vomited this morning and was nauseous last time but had ortho appt with Dr. Zigmund Daniel this morning but was canceling. Daughter disconnected call before NT spoke with her.Jennifer x 3 to call back with no answer. Advised her I will call daughter.  ?

## 2022-02-10 NOTE — Telephone Encounter (Signed)
Noted  

## 2022-02-10 NOTE — Discharge Instructions (Addendum)
Go to the emergency department for evaluation of your dizziness and nausea.   ? ? ?

## 2022-02-10 NOTE — ED Triage Notes (Signed)
Pt states she woke up this morning and she was nauseous. She began to see flashing lights and the room started to spin. Pt states she has not vomited.  ?

## 2022-02-10 NOTE — Telephone Encounter (Signed)
fyi

## 2022-02-10 NOTE — ED Notes (Signed)
MRI screening via phone  ? ?

## 2022-02-10 NOTE — Telephone Encounter (Signed)
Pt has went to UC, arrived at 1035. No further triage needed.  ?

## 2022-02-10 NOTE — ED Provider Notes (Signed)
Andrea Hart    CSN: [id number] Arrival date & time: 02/10/22  1035      History   Chief Complaint Chief Complaint  Patient presents with   Nausea   Dizziness    HPI Andrea Hart is a 86 y.o. female.  Accompanied by her daughter, patient presents with dizziness, nausea, and dry heaving since this morning.  She describes the dizziness as the room spinning and it is worse when she leans back.  She has been dry heaving but no vomiting.  No fever, chest pain, shortness of breath, abdominal pain, diarrhea, or other symptoms.  Last bowel movement this morning.  No treatment at home.  Patient was able to drink ginger ale at home this morning without vomiting.  Her medical history includes hypertension, CKD, CAD, stroke, hyperlipidemia, prediabetes, asthma, GI bleeding.  The history is provided by the patient, a relative and medical records.   Past Medical History:  Diagnosis Date   Abdominal hernia    Arthritis    Asthma    Carotid arterial disease ([place])    a. 11/2020 Carotid U/S: Less than 54% RICA, 56-25% LICA.   CKD (chronic kidney disease), stage III ([place])    Coronary artery disease    a. 2013 s/p PCI and stent placement in [facility] done for stable angina.   GI bleeding 02/02/2021   Hyperlipidemia    Hypertension    Hypothyroidism    PVC's (premature ventricular contractions)    a. 01/2020 Zio: RSR, 70, frequent PVCs with 10.5% burden.   Stroke [facility])    a. 2017 - ILR did not show afib.    Patient Active Problem List   Diagnosis Date Noted   Knee pain 11/29/2021   Osteoarthritis of knee 11/29/2021   Prediabetes 07/21/2021   Unsteady gait when walking 08/13/2020   Vitamin D deficiency 08/13/2020   CAD (coronary artery disease) 12/29/2019   CKD (chronic kidney disease), stage III ([place]) 12/29/2019   H/O coronary angioplasty 07/29/2018   Osteoarthritis 07/29/2018   Gastroesophageal reflux disease without esophagitis 07/29/2018   Hypothyroidism  05/20/2018   Anemia 05/20/2018   Essential hypertension 05/20/2018   Mild intermittent asthma 05/20/2018   Hyperlipidemia 05/20/2018   Dry eye syndrome, bilateral 08/09/2017   Pseudophakia, both eyes 06/29/2017   PVD (posterior vitreous detachment), right 06/29/2017   Macular retinal edema 06/26/2017    Past Surgical History:  Procedure Laterality Date   ABDOMINAL HYSTERECTOMY     APPENDECTOMY     CATARACT EXTRACTION Bilateral    CHOLECYSTECTOMY     COLONOSCOPY WITH PROPOFOL N/A 04/06/2021   Procedure: COLONOSCOPY WITH PROPOFOL;  Surgeon: Toledo, Benay Pike, MD;  Location: [facility] ENDOSCOPY;  Service: Gastroenterology;  Laterality: N/A;   CORONARY ANGIOPLASTY  2013   1xStent [facility] [place].   EYE SURGERY     HAND SURGERY Right    LOOP RECORDER IMPLANT     NISSEN FUNDOPLICATION  6389   REPLACEMENT TOTAL KNEE Bilateral 2017   TOTAL HIP ARTHROPLASTY Right    TOTAL VAGINAL HYSTERECTOMY      OB History     Gravida  2   Para  2   Term  2   Preterm      AB      Living  2      SAB      IAB      Ectopic      Multiple      Live Births  2  Home Medications    Prior to Admission medications   Medication Sig Start Date End Date Taking? Authorizing Provider  ADVAIR DISKUS 500-50 MCG/ACT AEPB INHALE 1 PUFF INTO THE LUNGS TWICE A DAY 02/07/22   Bacigalupo, Dionne Bucy, MD  albuterol (VENTOLIN HFA) 108 (90 Base) MCG/ACT inhaler Inhale 2 puffs into the lungs every 4 (four) hours as needed for wheezing or shortness of breath. 01/23/22   Virginia Crews, MD  amLODipine (NORVASC) 10 MG tablet Take 1 tablet (10 mg total) by mouth daily. 01/23/22   Virginia Crews, MD  aspirin EC 81 MG tablet Take 81 mg by mouth daily.    [provider]  budesonide-formoterol (SYMBICORT) 160-4.5 MCG/ACT inhaler Inhale 2 puffs into the lungs 2 (two) times daily. 01/23/22   Virginia Crews, MD  carvedilol (COREG) 12.5 MG tablet Take 1 tablet (12.5 mg total) by  mouth 2 (two) times daily. 01/23/22   Virginia Crews, MD  cetirizine (ZYRTEC) 10 MG tablet Take 1 tablet (10 mg total) by mouth daily. 01/23/22   Virginia Crews, MD  clopidogrel (PLAVIX) 75 MG tablet Take 1 tablet (75 mg total) by mouth daily. 01/23/22   Virginia Crews, MD  FeFum-FePoly-FA-B Cmp-C-Biot (INTEGRA PLUS) CAPS Take 1 capsule by mouth daily. Patient not taking: Reported on 02/01/2022 10/31/21   Virginia Crews, MD  levothyroxine (SYNTHROID) 75 MCG tablet Take 1 tablet (75 mcg total) by mouth daily before breakfast. 01/23/22   Bacigalupo, Dionne Bucy, MD  montelukast (SINGULAIR) 10 MG tablet Take 1 tablet (10 mg total) by mouth at bedtime. 01/23/22   Virginia Crews, MD  omeprazole (PRILOSEC) 40 MG capsule Take 1 capsule (40 mg total) by mouth 2 (two) times daily. 01/23/22   Virginia Crews, MD  rosuvastatin (CRESTOR) 20 MG tablet Take 1 tablet (20 mg total) by mouth daily. 01/23/22   Virginia Crews, MD  spironolactone (ALDACTONE) 25 MG tablet Take 0.5 tablets (12.5 mg total) by mouth daily. 02/01/22 05/02/22  Theora Gianotti, NP  Turmeric 500 MG TABS Take 1,000 mg by mouth daily.    [provider]  valsartan (DIOVAN) 320 MG tablet Take 1 tablet (320 mg total) by mouth daily. 02/01/22   Theora Gianotti, NP    Family History Family History  Problem Relation Age of Onset   Heart Problems Mother    Heart attack Mother    Heart Problems Father    Heart attack Brother    Heart Problems Brother    Colon cancer Neg Hx    Breast cancer Neg Hx     Social History Social History   Tobacco Use   Smoking status: Never   Smokeless tobacco: Never  Vaping Use   Vaping Use: Never used  Substance Use Topics   Alcohol use: Not Currently    Comment: Occasional   Drug use: Never     Allergies   Lidocaine   Review of Systems Review of Systems  Constitutional:  Negative for chills and fever.  Respiratory:  Negative for cough and  shortness of breath.   Cardiovascular:  Negative for chest pain and palpitations.  Gastrointestinal:  Positive for nausea. Negative for abdominal pain, constipation, diarrhea and vomiting.  Neurological:  Positive for dizziness. Negative for weakness, numbness and headaches.  All other systems reviewed and are negative.   Physical Exam Triage Vital Signs ED Triage Vitals  Enc Vitals Group     BP      Pulse  Resp      Temp      Temp src      SpO2      Weight      Height      Head Circumference      Peak Flow      Pain Score      Pain Loc      Pain Edu?      Excl. in [place]?    No data found.  Updated Vital Signs BP (!) 168/82    Pulse 64    Temp 98.2 F (36.8 C)    Resp 18    SpO2 95%   Visual Acuity Right Eye Distance:   Left Eye Distance:   Bilateral Distance:    Right Eye Near:   Left Eye Near:    Bilateral Near:     Physical Exam Vitals and nursing note reviewed.  Constitutional:      General: She is not in acute distress.    Appearance: She is well-developed. She is ill-appearing.  HENT:     Right Ear: Tympanic membrane normal.     Left Ear: Tympanic membrane normal.     Mouth/Throat:     Mouth: Mucous membranes are dry.     Pharynx: Oropharynx is clear.  Cardiovascular:     Rate and Rhythm: Normal rate and regular rhythm.     Heart sounds: Normal heart sounds.  Pulmonary:     Effort: Pulmonary effort is normal. No respiratory distress.     Breath sounds: Normal breath sounds.  Abdominal:     General: Bowel sounds are normal.     Palpations: Abdomen is soft.     Tenderness: There is no abdominal tenderness. There is no guarding or rebound.  Musculoskeletal:     Cervical back: Neck supple.  Skin:    General: Skin is warm and dry.  Neurological:     General: No focal deficit present.     Mental Status: She is alert and oriented to person, place, and time.     Sensory: No sensory deficit.     Motor: No weakness.     Gait: Gait normal.   Psychiatric:        Mood and Affect: Mood normal.        Behavior: Behavior normal.     UC Treatments / Results  Labs (all labs ordered are listed, but only abnormal results are displayed) Labs Reviewed - No data to display  EKG   Radiology No results found.  Procedures Procedures (including critical care time)  Medications Ordered in UC Medications  ondansetron (ZOFRAN-ODT) disintegrating tablet 4 mg (4 mg Oral Given 02/10/22 1131)    Initial Impression / Assessment and Plan / UC Course  I have reviewed the triage vital signs and the nursing notes.  Pertinent labs & imaging results that were available during my care of the patient were reviewed by me and considered in my medical decision making (see chart for details).   Dizziness, Nausea without vomiting.  Patient is ill-appearing.  Dry heaving while here in the office.  Zofran given; Patient remains nauseated and continues to feel dizzy with sensation of the room spinning.  Sending her to the ED for evaluation.  She and her daughter decline EMS.  Her daughter will drive her to the ED.   Final Clinical Impressions(s) / UC Diagnoses   Final diagnoses:  Dizziness  Nausea without vomiting     Discharge Instructions  Go to the emergency department for evaluation of your dizziness and nausea.         ED Prescriptions   None    PDMP not reviewed this encounter.   Sharion Balloon, NP 02/10/22 1137

## 2022-02-10 NOTE — ED Provider Notes (Addendum)
? ?[facility] ?Provider Note ? ? ? Event Date/Time  ? First MD Initiated Contact with Patient 02/10/22 1204   ?  (approximate) ? ? ?History  ? ?Dizziness ? ? ?HPI ? ?Andrea Hart is a 86 y.o. female with a history of hypertension, hyperlipidemia, CAD, CKD who comes the ED due to dizziness that started at 730 this morning.  It started while lying down to rest this morning, associated with nausea and a feeling of room spinning.  No headache, vision change, paresthesia, weakness.  No ear pain or loss of hearing.  No tinnitus.  No trauma.  No recent illness.  No neck pain or stiffness.  It has been constant throughout the morning, with only a brief period of feeling improved but then recurrent again.  She went to urgent care who forwarded her to the ED for evaluation due to dry heaving. ?  ? ? ?Physical Exam  ? ?Triage Vital Signs: ?ED Triage Vitals  ?Enc Vitals Group  ?   BP 02/10/22 1158 (!) 185/70  ?   Pulse Rate 02/10/22 1158 64  ?   Resp 02/10/22 1158 18  ?   Temp 02/10/22 1159 97.8 ?F (36.6 ?C)  ?   Temp src --   ?   SpO2 02/10/22 1158 97 %  ?   Weight 02/10/22 1159 145 lb 15.1 oz (66.2 kg)  ?   Height 02/10/22 1159 '5\' 2"'$  (1.575 m)  ?   Head Circumference --   ?   Peak Flow --   ?   Pain Score 02/10/22 1158 0  ?   Pain Loc --   ?   Pain Edu? --   ?   Excl. in [place]? --   ? ? ?Most recent vital signs: ?Vitals:  ? 02/10/22 1159 02/10/22 1230  ?BP:  (!) 178/67  ?Pulse:  66  ?Resp:  17  ?Temp: 97.8 ?F (36.6 ?C)   ?SpO2:  95%  ? ? ? ?General: Awake, no distress.  ?CV:  Good peripheral perfusion.  Normal peripheral pulses.  Normal heart sounds, no murmurs ?Resp:  Normal effort.  Clear to auscultation bilaterally ?Abd:  No distention.  Soft and nontender ?Other:  No peripheral edema.  Full range of motion in all extremities. ?EOMI, PERRL, normal strength bilaterally.  Normal cerebellar function.  No pronator drift. ? ? ?ED Results / Procedures / Treatments  ? ?Labs ?(all labs ordered are listed, but  only abnormal results are displayed) ?Labs Reviewed  ?BASIC METABOLIC PANEL - Abnormal; Notable for the following components:  ?    Result Value  ? CO2 21 (*)   ? Glucose, Bld 183 (*)   ? BUN 24 (*)   ? Creatinine, Ser 1.21 (*)   ? GFR, Estimated 44 (*)   ? All other components within normal limits  ?CBC - Abnormal; Notable for the following components:  ? WBC 12.5 (*)   ? All other components within normal limits  ?URINALYSIS, ROUTINE W REFLEX MICROSCOPIC  ?CBG MONITORING, ED  ? ? ? ?EKG ? ?Interpreted by me ?Normal sinus rhythm rate of 61.  Normal axis intervals QRS ST segments and T waves.  No acute ischemic changes. ? ? ?RADIOLOGY ?MRI brain reviewed and interpreted by me, negative for mass or large stroke.  Radiology report reviewed, unremarkable. ? ? ? ?PROCEDURES: ? ?Critical Care performed: No ? ?.1-3 Lead EKG Interpretation ?Performed by: Carrie Mew, MD ?Authorized by: Carrie Mew, MD  ? ?  Interpretation: normal   ?  ECG rate:  60 ?  ECG rate assessment: normal   ?  Rhythm: sinus rhythm   ?  Ectopy: none   ?  Conduction: normal   ? ? ?MEDICATIONS ORDERED IN ED: ?Medications  ?sodium chloride 0.9 % bolus 500 mL (500 mLs Intravenous New Bag/Given 02/10/22 1234)  ?LORazepam (ATIVAN) injection 0.5 mg (0.5 mg Intravenous Given 02/10/22 1234)  ?meclizine (ANTIVERT) tablet 25 mg (25 mg Oral Given 02/10/22 1234)  ? ? ? ?IMPRESSION / MDM / ASSESSMENT AND PLAN / ED COURSE  ?I reviewed the triage vital signs and the nursing notes. ?             ?               ? ?Differential diagnosis includes, but is not limited to, intracranial mass, intracranial hemorrhage, ischemic stroke, dehydration, electrolyte abnormality, peripheral vertigo ? ?**The patient is on the cardiac monitor to evaluate for evidence of arrhythmia and/or significant heart rate changes.**} ? ?Patient presents with acute vertiginous symptoms.  Will give meclizine and low-dose Ativan along with IV fluids in the ED to attempt to relieve  symptoms while obtaining labs and MRI brain due to her age and comorbidities. ? ? ?----------------------------------------- ?3:07 PM on 02/10/2022 ?----------------------------------------- ?MRI unremarkable.  Patient's symptoms have resolved, no dizziness anymore.  Vital signs stable.  Not requiring hospitalization due to reassuring work-up and resolution of symptoms. ?  ? ? ?FINAL CLINICAL IMPRESSION(S) / ED DIAGNOSES  ? ?Final diagnoses:  ?Dizziness  ? ? ? ?Rx / DC Orders  ? ?ED Discharge Orders   ? ? None  ? ?  ? ? ? ?Note:  This document was prepared using Dragon voice recognition software and may include unintentional dictation errors. ?  ?Carrie Mew, MD ?02/10/22 1406 ? ?  ?Carrie Mew, MD ?02/10/22 1508 ? ?

## 2022-02-10 NOTE — ED Triage Notes (Signed)
Pt here from UC with dizziness that started this morning. Pt denies vomiting but is having severe nausea. Pt denies pain. Pt ambulatory to tx room. ?

## 2022-02-13 ENCOUNTER — Ambulatory Visit: Payer: Self-pay

## 2022-02-13 NOTE — Telephone Encounter (Signed)
Pt stated she still feels light headed. Pt requests call back asap. Cb# [phone]  ? ?Left message to call back about symptoms. ?

## 2022-02-13 NOTE — Telephone Encounter (Signed)
Summary: pt feels light headed  ? Pt stated she still feels light headed. Pt requests call back asap. Cb# [phone]   ?  ? ? ? ?Chief Complaint: lightheadedness ?Symptoms: feels lightheaded when standing, better today , worse when changing head position, nausea ?Frequency: Friday 3/10, seen in ED ?Pertinent Negatives: Patient denies chest pain, difficulty breathing, vomiting.no head spinning today. No "dry heaves". ?Disposition: '[]'$ ED /'[]'$ Urgent Care (no appt availability in office) / '[x]'$ Appointment(In office/virtual)/ '[]'$  [facility]/ '[]'$ Home Care/ '[]'$ Refused Recommended Disposition /'[]'$ [facility]/ '[]'$  Follow-up with PCP ?Additional Notes:  ? ?Seen in ED 02/10/22.  ? ? ?Reason for Disposition ? [1] MODERATE dizziness (e.g., interferes with normal activities) AND [2] has been evaluated by physician for this ? ?Answer Assessment - Initial Assessment Questions ?1. DESCRIPTION: "Describe your dizziness." ?    lightheaded ?2. LIGHTHEADED: "Do you feel lightheaded?" (e.g., somewhat faint, woozy, weak upon standing) ?    Yes stands up slow ?3. VERTIGO: "Do you feel like either you or the room is spinning or tilting?" (i.e. vertigo) ?    Not today  ?4. SEVERITY: "How bad is it?"  "Do you feel like you are going to faint?" "Can you stand and walk?" ?  - MILD: Feels slightly dizzy, but walking normally. ?  - MODERATE: Feels unsteady when walking, but not falling; interferes with normal activities (e.g., school, work). ?  - SEVERE: Unable to walk without falling, or requires assistance to walk without falling; feels like passing out now.  ?   Mild to moderate , not spinning but lightheaded ?5. ONSET:  "When did the dizziness begin?" ?    Friday  ?6. AGGRAVATING FACTORS: "Does anything make it worse?" (e.g., standing, change in head position) ?    Change head position ?7. HEART RATE: "Can you tell me your heart rate?" "How many beats in 15 seconds?"  (Note: not all patients can do this)   ?    na ?8. CAUSE:  "What do you think is causing the dizziness?" ?    Not sure  ?9. RECURRENT SYMPTOM: "Have you had dizziness before?" If Yes, ask: "When was the last time?" "What happened that time?" ?    Yes ,  possible ear issues  ?10. OTHER SYMPTOMS: "Do you have any other symptoms?" (e.g., fever, chest pain, vomiting, diarrhea, bleeding) ?      nausea ?11. PREGNANCY: "Is there any chance you are pregnant?" "When was your last menstrual period?" ?      na ? ?Protocols used: Dizziness - Lightheadedness-A-AH ? ?

## 2022-02-15 NOTE — Progress Notes (Signed)
?  ? ? ?Established patient visit ? ? ?Patient: Andrea Hart   DOB: 14-Dec-1935   86 y.o. Female  MRN: [id number] ?Visit Date: 02/16/2022 ? ?Today's healthcare provider: Lavon Paganini, MD  ? ?Chief Complaint  ?Patient presents with  ? ER follow up  ? ?Subjective  ?  ?HPI  ?Follow up ER visit ? ?Patient was seen in ER for dry heaving on 02/10/22. ?She was treated for dizziness. ?Treatment for this included meclizine and low dose Ativan along with IV fluids while obtaining labs and MRI. ?She reports excellent compliance with treatment. Pt reports continued light-headedness.   ?She reports this condition is Improved. ? ?----------------------------------------------------------------------------------------- ? ? ?Medications: ?Outpatient Medications Prior to Visit  ?Medication Sig  ? ADVAIR DISKUS 500-50 MCG/ACT AEPB INHALE 1 PUFF INTO THE LUNGS TWICE A DAY  ? albuterol (VENTOLIN HFA) 108 (90 Base) MCG/ACT inhaler Inhale 2 puffs into the lungs every 4 (four) hours as needed for wheezing or shortness of breath.  ? amLODipine (NORVASC) 10 MG tablet Take 1 tablet (10 mg total) by mouth daily.  ? aspirin EC 81 MG tablet Take 81 mg by mouth daily.  ? budesonide-formoterol (SYMBICORT) 160-4.5 MCG/ACT inhaler Inhale 2 puffs into the lungs 2 (two) times daily.  ? carvedilol (COREG) 12.5 MG tablet Take 1 tablet (12.5 mg total) by mouth 2 (two) times daily.  ? cetirizine (ZYRTEC) 10 MG tablet Take 1 tablet (10 mg total) by mouth daily.  ? clopidogrel (PLAVIX) 75 MG tablet Take 1 tablet (75 mg total) by mouth daily.  ? FeFum-FePoly-FA-B Cmp-C-Biot (INTEGRA PLUS) CAPS Take 1 capsule by mouth daily.  ? levothyroxine (SYNTHROID) 75 MCG tablet Take 1 tablet (75 mcg total) by mouth daily before breakfast.  ? meclizine (ANTIVERT) 25 MG tablet Take 0.5 tablets (12.5 mg total) by mouth 3 (three) times daily as needed for dizziness or nausea.  ? montelukast (SINGULAIR) 10 MG tablet Take 1 tablet (10 mg total) by mouth at bedtime.  ?  omeprazole (PRILOSEC) 40 MG capsule Take 1 capsule (40 mg total) by mouth 2 (two) times daily.  ? rosuvastatin (CRESTOR) 20 MG tablet Take 1 tablet (20 mg total) by mouth daily.  ? spironolactone (ALDACTONE) 25 MG tablet Take 0.5 tablets (12.5 mg total) by mouth daily.  ? Turmeric 500 MG TABS Take 1,000 mg by mouth daily.  ? valsartan (DIOVAN) 320 MG tablet Take 1 tablet (320 mg total) by mouth daily.  ? ?No facility-administered medications prior to visit.  ? ? ?Review of Systems per HPI ? ?Last CBC ?Lab Results  ?Component Value Date  ? WBC 12.5 (H) 02/10/2022  ? HGB 12.1 02/10/2022  ? HCT 38.0 02/10/2022  ? MCV 92.0 02/10/2022  ? MCH 29.3 02/10/2022  ? RDW 12.6 02/10/2022  ? PLT 278 02/10/2022  ? ?Last metabolic panel ?Lab Results  ?Component Value Date  ? GLUCOSE 183 (H) 02/10/2022  ? NA 135 02/10/2022  ? K 4.4 02/10/2022  ? CL 106 02/10/2022  ? CO2 21 (L) 02/10/2022  ? BUN 24 (H) 02/10/2022  ? CREATININE 1.21 (H) 02/10/2022  ? GFRNONAA 44 (L) 02/10/2022  ? CALCIUM 9.6 02/10/2022  ? PHOS 4.2 01/04/2021  ? PROT 6.9 01/23/2022  ? ALBUMIN 4.4 01/23/2022  ? LABGLOB 2.5 01/23/2022  ? AGRATIO 1.8 01/23/2022  ? BILITOT 0.3 01/23/2022  ? ALKPHOS 68 01/23/2022  ? AST 17 01/23/2022  ? ALT 10 01/23/2022  ? ANIONGAP 8 02/10/2022  ? ?Last lipids ?Lab Results  ?Component Value  Date  ? CHOL 137 07/21/2021  ? HDL 50 07/21/2021  ? [place] 64 07/21/2021  ? TRIG 128 07/21/2021  ? CHOLHDL 2.7 07/21/2021  ? ?Last hemoglobin A1c ?Lab Results  ?Component Value Date  ? HGBA1C 6.0 (H) 01/23/2022  ? ?Last thyroid functions ?Lab Results  ?Component Value Date  ? TSH 2.210 01/23/2022  ? T4TOTAL 10.5 10/28/2018  ? ?  ?  Objective  ?  ?BP 110/68 (BP Location: Right Arm, Cuff Size: Normal)   Pulse (!) 54   Temp 97.6 ?F (36.4 ?C) (Oral)   Resp 14   Wt 148 lb (67.1 kg)   SpO2 98%   BMI 27.07 kg/m?  ?BP Readings from Last 3 Encounters:  ?02/16/22 110/68  ?02/10/22 (!) 180/68  ?02/10/22 (!) 168/82  ? ?Wt Readings from Last 3 Encounters:   ?02/16/22 148 lb (67.1 kg)  ?02/10/22 145 lb 15.1 oz (66.2 kg)  ?02/01/22 146 lb (66.2 kg)  ? ?  ? ?Physical Exam ?Vitals reviewed.  ?Constitutional:   ?   General: She is not in acute distress. ?   Appearance: Normal appearance. She is well-developed. She is not diaphoretic.  ?HENT:  ?   Head: Normocephalic and atraumatic.  ?   Right Ear: Tympanic membrane, ear canal and external ear normal.  ?   Left Ear: Tympanic membrane, ear canal and external ear normal.  ?   Nose: Nose normal.  ?   Mouth/Throat:  ?   Mouth: Mucous membranes are moist.  ?   Pharynx: Oropharynx is clear.  ?Eyes:  ?   General: No scleral icterus. ?   Conjunctiva/sclera: Conjunctivae normal.  ?   Comments: Faint nystagmus on L ward gaze  ?Neck:  ?   Thyroid: No thyromegaly.  ?Cardiovascular:  ?   Rate and Rhythm: Normal rate and regular rhythm.  ?   Pulses: Normal pulses.  ?   Heart sounds: Normal heart sounds. No murmur heard. ?Pulmonary:  ?   Effort: Pulmonary effort is normal. No respiratory distress.  ?   Breath sounds: Normal breath sounds. No wheezing, rhonchi or rales.  ?Musculoskeletal:  ?   Cervical back: Neck supple.  ?   Right lower leg: No edema.  ?   Left lower leg: No edema.  ?Lymphadenopathy:  ?   Cervical: No cervical adenopathy.  ?Skin: ?   General: Skin is warm and dry.  ?   Findings: No rash.  ?Neurological:  ?   Mental Status: She is alert and oriented to person, place, and time. Mental status is at baseline.  ?Psychiatric:     ?   Mood and Affect: Mood normal.     ?   Behavior: Behavior normal.  ?  ? ? ?No results found for any visits on 02/16/22. ? Assessment & Plan  ?  ? ?Problem List Items Addressed This Visit   ?None ?Visit Diagnoses   ? ? Dizziness    -  Primary  ? Sinus congestion      ? ?  ? - Her dizziness seems consistent with vertigo, especially with negative MRI and negative orthostatics ?- She has not tried the meclizine, which I encouraged ?- Sinus congestion may be contributing to this, so we will try  Flonase ?- Consider vestibular PT, but patient declines at this time ?- May need to consider ENT referral in the future ? ?No follow-ups on file.  ?   ? ?Total time spent on today's visit was greater than 30 minutes,  including both face-to-face time and nonface-to-face time personally spent on review of chart (labs and imaging), discussing labs and goals, discussing treatment options, possible referrals, answering patient's questions, and coordinating care.  ? ?I,Jana Robinson,acting as a scribe for Lavon Paganini, MD.,have documented all relevant documentation on the behalf of Lavon Paganini, MD,as directed by  Lavon Paganini, MD while in the presence of Lavon Paganini, MD.  ? ?I, Lavon Paganini, MD, have reviewed all documentation for this visit. The documentation on 02/16/22 for the exam, diagnosis, procedures, and orders are all accurate and complete. ? ? ?Virginia Crews, MD, MPH ?[place] ?[facility]   ?

## 2022-02-16 ENCOUNTER — Ambulatory Visit (INDEPENDENT_AMBULATORY_CARE_PROVIDER_SITE_OTHER): Payer: Medicare Other | Admitting: Family Medicine

## 2022-02-16 ENCOUNTER — Other Ambulatory Visit: Payer: Self-pay

## 2022-02-16 ENCOUNTER — Encounter: Payer: Self-pay | Admitting: Family Medicine

## 2022-02-16 VITALS — BP 110/68 | HR 54 | Temp 97.6°F | Resp 14 | Wt 148.0 lb

## 2022-02-16 DIAGNOSIS — R0981 Nasal congestion: Secondary | ICD-10-CM

## 2022-02-16 DIAGNOSIS — R42 Dizziness and giddiness: Secondary | ICD-10-CM | POA: Diagnosis not present

## 2022-02-16 MED ORDER — FLUTICASONE PROPIONATE 50 MCG/ACT NA SUSP: 2.0000 | Freq: Every day | NASAL | 6 refills | Status: DC

## 2022-02-19 ENCOUNTER — Other Ambulatory Visit: Payer: Self-pay | Admitting: Family Medicine

## 2022-02-19 DIAGNOSIS — E785 Hyperlipidemia, unspecified: Secondary | ICD-10-CM

## 2022-02-20 ENCOUNTER — Encounter: Payer: Self-pay | Admitting: Family Medicine

## 2022-02-21 ENCOUNTER — Encounter: Payer: Self-pay | Admitting: Family Medicine

## 2022-02-21 ENCOUNTER — Inpatient Hospital Stay (INDEPENDENT_AMBULATORY_CARE_PROVIDER_SITE_OTHER): Payer: Medicare Other | Admitting: Radiology

## 2022-02-21 ENCOUNTER — Ambulatory Visit (INDEPENDENT_AMBULATORY_CARE_PROVIDER_SITE_OTHER): Payer: Medicare Other | Admitting: Family Medicine

## 2022-02-21 ENCOUNTER — Other Ambulatory Visit: Payer: Self-pay

## 2022-02-21 VITALS — BP 138/82 | HR 65 | Ht 62.0 in | Wt 147.6 lb

## 2022-02-21 DIAGNOSIS — M19012 Primary osteoarthritis, left shoulder: Secondary | ICD-10-CM | POA: Insufficient documentation

## 2022-02-21 MED ORDER — TRIAMCINOLONE ACETONIDE 40 MG/ML IJ SUSP
80.0000 mg | Freq: Once | INTRAMUSCULAR | Status: AC
  Administered 2022-02-21: 80 mg via INTRAMUSCULAR

## 2022-02-21 MED ORDER — TRIAMCINOLONE ACETONIDE 40 MG/ML IJ SUSP: 40.0000 mg | Freq: Once | INTRAMUSCULAR | Status: DC

## 2022-02-21 MED ORDER — MECLIZINE HCL 25 MG PO TABS: 12.5000 mg | ORAL_TABLET | Freq: Three times a day (TID) | ORAL | 1 refills | Status: DC | PRN

## 2022-02-21 NOTE — Assessment & Plan Note (Signed)
Patient presents for initial visit regarding chronic left shoulder pain in the setting of severe osteoarthritis.  She had previously been seeing a pain clinic in [facility] and states that she was receiving both PRP and Wharton's jelly injections with relief.  Unfortunate, that clinic has since shut down, per patient for unknown reasons.  She presents here via her PCP Dr. Brita Romp for additional management options. ? ?Patient's physical exam reveals significantly diminished range of motion all directions due to pain, weakness, and mechanical block, passively can obtain forward flexion to 110 degrees, abduction to 85 degrees, internal rotation/extension to left hip, external rotation 10 degrees, resisted RC testing 4 -/5 in all directions, significant crepitus noted with passive range of motion. ? ?Her clinical history and findings are consistent with severe osteoarthritis related arthralgia in the setting of rotator cuff dysfunction.  I have reviewed evidence-based treatment strategies inclusive of corticosteroids, physical therapy, oral pharmacotherapy options for chronic musculoskeletal pain.  Patient is requesting a restart of Wharton's jelly as long as there is insurance coverage.  I did reach out to our office manager to seek additional information regarding the financial component of this injectable however literature review reveals a paucity of robust data supporting the use of this injectable and I did relay the same to the patient and her daughter via lengthy discussion about this injectable medication in addition to alternative medication based management options. ? ?That being said, from a pain control standpoint, she did opt to proceed with ultrasound-guided left shoulder intra-articular corticosteroid injection, tolerated this well with immediate decrease in pain and increase in range of motion/function due to lidocaine component.  Given her age, relative comorbid risks and contraindications to  additional pharmacotherapy, I did discuss that repeat cortisone can be performed when symptoms recur but that we will ideally aim for no more frequent than 3 months if possible.  She can follow-up on as-needed basis. ? ?Chronic condition, symptomatic, Rx management ?

## 2022-02-21 NOTE — Patient Instructions (Signed)
You have just been given a cortisone injection to reduce pain and inflammation. After the injection you may notice immediate relief of pain as a result of the Lidocaine. It is important to rest the area of the injection for 24 to 48 hours after the injection. There is a possibility of some temporary increased discomfort and swelling for up to 72 hours until the cortisone begins to work. If you do have pain, simply rest the joint and use ice. If you can tolerate over the counter medications, you can try Tylenol for added relief per package instructions. ?-As above, relative rest x2 days and gradual return to normal activity ?- Our office will reach out to determine coverage/availability of Wharton's jelly ?- Can research additional as follows: ?A.  Viscosupplementation (hyaluronic acid) ?B.  Platelet rich plasma ?C.  Cymbalta (duloxetine) ?D.  Suprascapular nerve block ?

## 2022-02-21 NOTE — Telephone Encounter (Signed)
Yes. Please send refill. Make sure it is sent to requested pharmacy. Thanks!

## 2022-02-21 NOTE — Progress Notes (Signed)
?  ? ?Primary Care / Sports Medicine Office Visit ? ?Patient Information:  ?Patient ID: Andrea Hart, female DOB: 1936-06-09 Age: 86 y.o. MRN: [id number]  ? ?Andrea Hart is a pleasant 86 y.o. female presenting with the following: ? ?Chief Complaint  ?Patient presents with  ? Shoulder Pain  ?  Left shoulder for years, went to pain clinic in [facility] 6 months ago, they have since shut down, she was sent here by Dr. Brita Romp. Has been given PRP once in the past, after that [facility], and all of this helped. Was told she was bone on bone  ? ? ?Vitals:  ? 02/21/22 1319  ?BP: 138/82  ?Pulse: 65  ?SpO2: 98%  ? ?Vitals:  ? 02/21/22 1319  ?Weight: 147 lb 9.6 oz (67 kg)  ?Height: '5\' 2"'$  (1.575 m)  ? ?Body mass index is 27 kg/m?. ? ?  ? ?Independent interpretation of notes and tests performed by another provider:  ? ?None ? ?Procedures performed:  ? ?Procedure:  Injection of left glenohumeral joint under ultrasound guidance. ?Ultrasound guidance utilized for out of plane left glenohumeral joint injection, cortical irregularity consistent with osteoarthritis noted, no effusion ?Samsung HS60 device utilized with permanent recording / reporting. ?Verbal informed consent obtained and verified. ?Skin prepped in a sterile fashion. ?Ethyl chloride for topical local analgesia.  ?Completed without difficulty and tolerated well. ?Medication: triamcinolone acetonide 40 mg/mL suspension for injection 2 mL total and 1.5 mL lidocaine 1% without epinephrine utilized for needle placement anesthetic ?Advised to contact for fevers/chills, erythema, induration, drainage, or persistent bleeding. ? ? ?Pertinent History, Exam, Impression, and Recommendations:  ? ?Osteoarthritis of glenohumeral joint, left ?Patient presents for initial visit regarding chronic left shoulder pain in the setting of severe osteoarthritis.  She had previously been seeing a pain clinic in [facility] and states that she was receiving both PRP  and Wharton's jelly injections with relief.  Unfortunate, that clinic has since shut down, per patient for unknown reasons.  She presents here via her PCP Dr. Brita Romp for additional management options. ? ?Patient's physical exam reveals significantly diminished range of motion all directions due to pain, weakness, and mechanical block, passively can obtain forward flexion to 110 degrees, abduction to 85 degrees, internal rotation/extension to left hip, external rotation 10 degrees, resisted RC testing 4 -/5 in all directions, significant crepitus noted with passive range of motion. ? ?Her clinical history and findings are consistent with severe osteoarthritis related arthralgia in the setting of rotator cuff dysfunction.  I have reviewed evidence-based treatment strategies inclusive of corticosteroids, physical therapy, oral pharmacotherapy options for chronic musculoskeletal pain.  Patient is requesting a restart of Wharton's jelly as long as there is insurance coverage.  I did reach out to our office manager to seek additional information regarding the financial component of this injectable however literature review reveals a paucity of robust data supporting the use of this injectable and I did relay the same to the patient and her daughter via lengthy discussion about this injectable medication in addition to alternative medication based management options. ? ?That being said, from a pain control standpoint, she did opt to proceed with ultrasound-guided left shoulder intra-articular corticosteroid injection, tolerated this well with immediate decrease in pain and increase in range of motion/function due to lidocaine component.  Given her age, relative comorbid risks and contraindications to additional pharmacotherapy, I did discuss that repeat cortisone can be performed when symptoms recur but that we will ideally aim  for no more frequent than 3 months if possible.  She can follow-up on as-needed  basis. ? ?Chronic condition, symptomatic, Rx management  ? ?Orders & Medications ?Meds ordered this encounter  ?Medications  ? DISCONTD: triamcinolone acetonide (KENALOG-40) injection 40 mg  ? triamcinolone acetonide (KENALOG-40) injection 80 mg  ? ?Orders Placed This Encounter  ?Procedures  ? [place] LIMITED JOINT SPACE STRUCTURES UP LEFT  ?  ? ?Return if symptoms worsen or fail to improve.  ?  ? ?Montel Culver, MD ? ? Primary Care Sports Medicine ?[facility] ?[place]  ? ?

## 2022-02-23 ENCOUNTER — Other Ambulatory Visit: Payer: Self-pay

## 2022-02-23 ENCOUNTER — Ambulatory Visit (INDEPENDENT_AMBULATORY_CARE_PROVIDER_SITE_OTHER): Payer: Medicare Other

## 2022-02-23 DIAGNOSIS — R0989 Other specified symptoms and signs involving the circulatory and respiratory systems: Secondary | ICD-10-CM

## 2022-02-27 ENCOUNTER — Telehealth: Payer: Self-pay | Admitting: Nurse Practitioner

## 2022-02-27 ENCOUNTER — Other Ambulatory Visit: Payer: Self-pay | Admitting: *Deleted

## 2022-02-27 DIAGNOSIS — I6523 Occlusion and stenosis of bilateral carotid arteries: Secondary | ICD-10-CM

## 2022-02-27 NOTE — Telephone Encounter (Signed)
I called the patient today to go over the results of her most recent carotid US. ? ? Theora Gianotti, NP  ?02/25/2022 11:36 AM EDT   ?  ?Some progression of carotid disease on the left - now 60-79% (previously 50-69%).  Cont asa/plavix/rosuvastatin.  F/u carotid u/s in 1 year.  ? ?The patient was made aware of results as stated above and voices understanding. ? ?She then inquired if I could send a message to Gerald Stabs, NP advising him: ?- she was in the ER ~ 2 weeks ago for vertigo ?- she was given a RX for meclizine to take ?- SBP in the ER was 110 ?- patient is now concerned because when she eats, she feels worse/ lightheaded after this for ~ 20 minutes, then symptoms will resolve ? ?I have confirmed with the patient that she is not monitoring her BP at home when she feels bad because she does not think her current cuff is accurate, but she is looking at buying a new one.  ?I have inquired if she is still taking meclizine 25 mg - 0.5 tablet (12.5 mg) TID PRN for dizziness/ nausea. ? ?Per, the patient, her daughter is helping with her meds, but she thinks she has been taking meclizine 25 mg- 1 whole tablet TID. ? ?I have advised the patient that she really only needs to take meclizine 12.5 mg TID as needed, so if she is not currently having vertigo symptoms, she really doesn't need to take this as a scheduled dose every day. ?Although I have advised her I am not sure if her taking meclizine TID has any bearing on how she feels after she eats. ? ?I have advised the patient I will forward to Gerald Stabs, NP for further review/ recommendations. ? ?She advised that we could send a response back via her MyChart, no need to call.  ?

## 2022-02-28 NOTE — Telephone Encounter (Signed)
Agree that she can use meclizine as needed, though I'm not sure that it's contributing to postprandial lightheadedness.  I do think understanding of her blood pressures during these episodes is important as shifting of blood to her GI tract for the sake of digestion could potentially result hypotension.  ?

## 2022-03-01 NOTE — Telephone Encounter (Signed)
Reviewed recommendations by provider with patient. She states she was taking it every day and not as needed. We reviewed instructions to only take as needed. She verbalized understanding and was appreciative for the call back. No further questions at this time.  ?

## 2022-03-10 ENCOUNTER — Telehealth: Payer: Self-pay | Admitting: *Deleted

## 2022-03-10 NOTE — Chronic Care Management (AMB) (Signed)
?  Care Management  ? ?Note ? ?03/10/2022 ?Name: LARISSA PEGG MRN: [id number] DOB: 01/22/1936 ? ?MAKAILYN MCCORMICK is a 86 y.o. year old female who is a primary care patient of Bacigalupo, Dionne Bucy, MD. I reached out to [name] by phone today offer care coordination services.  ? ?Ms. Spurling was given information about care management services today including:  ?Care management services include personalized support from designated clinical staff supervised by her physician, including individualized plan of care and coordination with other care providers ?24/7 contact phone numbers for assistance for urgent and routine care needs. ?The patient may stop care management services at any time by phone call to the office staff. ? ?Patient did not agree to enrollment in care management services and does not wish to consider at this time. ? ?Follow up plan: ?The care management team is available to follow up with the patient after provider conversation with the patient regarding recommendation for care management engagement and subsequent re-referral to the care management team.  ? ?Laverda Sorenson  ?Care Guide, Embedded Care Coordination ?[facility]  Care Management  ?Direct Dial: [phone] ? ?

## 2022-03-27 ENCOUNTER — Encounter: Payer: Self-pay | Admitting: Family Medicine

## 2022-03-27 ENCOUNTER — Inpatient Hospital Stay: Payer: Self-pay | Admitting: Radiology

## 2022-03-27 ENCOUNTER — Ambulatory Visit (INDEPENDENT_AMBULATORY_CARE_PROVIDER_SITE_OTHER): Payer: Medicare Other | Admitting: Family Medicine

## 2022-03-27 VITALS — BP 122/64 | HR 74 | Ht 62.0 in | Wt 143.0 lb

## 2022-03-27 DIAGNOSIS — M19012 Primary osteoarthritis, left shoulder: Secondary | ICD-10-CM

## 2022-03-27 MED ORDER — DULOXETINE HCL 30 MG PO CPEP: ORAL_CAPSULE | ORAL | 0 refills | Status: DC

## 2022-03-27 MED ORDER — TRIAMCINOLONE ACETONIDE 40 MG/ML IJ SUSP
40.0000 mg | Freq: Once | INTRAMUSCULAR | Status: AC
  Administered 2022-03-27: 40 mg via INTRAMUSCULAR

## 2022-03-27 NOTE — Assessment & Plan Note (Signed)
Patient presents for follow-up to chronic left shoulder pain in the setting of severe osteoarthritis, at her last visit on 02/21/2022 we did review possible restart of Wharton's jelly however this is not an authorized product that we can use.  Patient did elect to proceed with ultrasound-guided cortisone injection at that last visit and reported significant improvement in her pain, range of motion, and overall function of left shoulder.  She has noted pain making a gradual occurrence for the past few days, though not as bad as she had previously experienced. ? ?Examination today does reveal significant improved range of motion, still not full, crepitus noted, resisted rotator cuff testing 5 -/5 in all directions, positive glenohumeral load testing. ? ?Given her recurrent symptomatology I did discuss additional treatment strategies and she is amenable to consideration of duloxetine for chronic musculoskeletal pain, appropriate stepwise dosing discussed.  Additionally, patient requested repeat cortisone injection, given the timing of this injection and proximity to prior injection, risks versus benefits including further advance of osteoarthritis discussed the patient has dealt elected to proceed.  This is reasonable given the risk and benefits discussed and maintaining patient's independence with daily tasks of living.  Lastly, I have provided home exercises for patient to initiate.  She will return for follow-up in 6 weeks to assess her response to duloxetine if she chooses to start this medication. ? ?Chronic condition, symptomatic, Rx management ?

## 2022-03-27 NOTE — Patient Instructions (Addendum)
You have just been given a cortisone injection to reduce pain and inflammation. After the injection you may notice immediate relief of pain as a result of the Lidocaine. It is important to rest the area of the injection for 24 to 48 hours after the injection. There is a possibility of some temporary increased discomfort and swelling for up to 72 hours until the cortisone begins to work. If you do have pain, simply rest the joint and use ice. If you can tolerate over the counter medications, you can try Tylenol for added relief per package instructions. ?- Start duloxetine 1 capsule nightly x2 weeks ?- After 2 weeks increase duloxetine to 2 capsules until follow-up ?- Start home exercises towards the end of this week and continue on a regular basis until return ?- Return for follow-up in 6 weeks ?

## 2022-03-27 NOTE — Progress Notes (Signed)
?  ? ?Primary Care / Sports Medicine Office Visit ? ?Patient Information:  ?Patient ID: Andrea Hart, female DOB: Nov 11, 1936 Age: 86 y.o. MRN: [id number]  ? ?Andrea Hart is a pleasant 86 y.o. female presenting with the following: ? ?Chief Complaint  ?Patient presents with  ? Shoulder Pain  ?  Pt here for left shoulder injection.   ? ? ?Vitals:  ? 03/27/22 1111  ?BP: 122/64  ?Pulse: 74  ?SpO2: 98%  ? ?Vitals:  ? 03/27/22 1111  ?Weight: 143 lb (64.9 kg)  ?Height: '5\' 2"'$  (1.575 m)  ? ?Body mass index is 26.16 kg/m?. ? ?No results found.  ? ?Independent interpretation of notes and tests performed by another provider:  ? ?None ? ?Procedures performed:  ? ?Procedure:  Injection of left glenohumeral joint under ultrasound guidance. ?Ultrasound guidance utilized for out of plane approach to the left glenohumeral joint, dynamic motion noted, cortical roughening consistent with osteoarthritis noted ?Samsung HS60 device utilized with permanent recording / reporting. ?Verbal informed consent obtained and verified. ?Skin prepped in a sterile fashion. ?Ethyl chloride for topical local analgesia.  ?Completed without difficulty and tolerated well. ?Medication: triamcinolone acetonide 40 mg/mL suspension for injection 1 mL total and 2 mL lidocaine 1% without epinephrine utilized for needle placement anesthetic ?Advised to contact for fevers/chills, erythema, induration, drainage, or persistent bleeding. ? ? ?Pertinent History, Exam, Impression, and Recommendations:  ? ?Problem List Items Addressed This Visit   ? ?  ? Musculoskeletal and Integument  ? Osteoarthritis of glenohumeral joint, left - Primary  ?  Patient presents for follow-up to chronic left shoulder pain in the setting of severe osteoarthritis, at her last visit on 02/21/2022 we did review possible restart of Wharton's jelly however this is not an authorized product that we can use.  Patient did elect to proceed with ultrasound-guided cortisone injection at that last  visit and reported significant improvement in her pain, range of motion, and overall function of left shoulder.  She has noted pain making a gradual occurrence for the past few days, though not as bad as she had previously experienced. ? ?Examination today does reveal significant improved range of motion, still not full, crepitus noted, resisted rotator cuff testing 5 -/5 in all directions, positive glenohumeral load testing. ? ?Given her recurrent symptomatology I did discuss additional treatment strategies and she is amenable to consideration of duloxetine for chronic musculoskeletal pain, appropriate stepwise dosing discussed.  Additionally, patient requested repeat cortisone injection, given the timing of this injection and proximity to prior injection, risks versus benefits including further advance of osteoarthritis discussed the patient has dealt elected to proceed.  This is reasonable given the risk and benefits discussed and maintaining patient's independence with daily tasks of living.  Lastly, I have provided home exercises for patient to initiate.  She will return for follow-up in 6 weeks to assess her response to duloxetine if she chooses to start this medication. ? ?Chronic condition, symptomatic, Rx management ? ?  ?  ? Relevant Medications  ? DULoxetine (CYMBALTA) 30 MG capsule  ? Other Relevant Orders  ? [place] LIMITED JOINT SPACE STRUCTURES UP LEFT  ?  ? ?Orders & Medications ?Meds ordered this encounter  ?Medications  ? triamcinolone acetonide (KENALOG-40) injection 40 mg  ? DULoxetine (CYMBALTA) 30 MG capsule  ?  Sig: Take 1 capsule (30 mg total) by mouth every evening for 14 days, THEN 2 capsules (60 mg total) every evening.  ?  Dispense:  74 capsule  ?  Refill:  0  ? ?Orders Placed This Encounter  ?Procedures  ? [place] LIMITED JOINT SPACE STRUCTURES UP LEFT  ?  ? ?Return in about 6 weeks (around 05/08/2022).  ?  ? ?Montel Culver, MD ? ? Primary Care Sports Medicine ?[facility] ?[place]  ? ?

## 2022-04-22 ENCOUNTER — Other Ambulatory Visit: Payer: Self-pay | Admitting: Family Medicine

## 2022-04-22 DIAGNOSIS — M19012 Primary osteoarthritis, left shoulder: Secondary | ICD-10-CM

## 2022-04-24 NOTE — Telephone Encounter (Signed)
Ok to refill 180 for 90 days at 2- '30mg'$  every evening?

## 2022-04-24 NOTE — Telephone Encounter (Signed)
Requested medication (s) are due for refill today:   Provider to review  Requested medication (s) are on the active medication list:   Yes  Future visit scheduled:   Yes with Dr. Zigmund Daniel   Last ordered: There are 2 strengths.   30 mg and 60 mg.  04/10/2022 for 60 mg  Returned for clarification, also a 90 day supply is requested and a DX Code   Requested Prescriptions  Pending Prescriptions Disp Refills   DULoxetine (CYMBALTA) 30 MG capsule [Pharmacy Med Name: DULOXETINE HCL DR 30 MG CAP] 222 capsule 1    Sig: TAKE 1 CAPSULE (30 MG TOTAL) BY MOUTH EVERY EVENING FOR 14 DAYS, THEN 2 CAPSULES EVERY EVENING.     Psychiatry: Antidepressants - SNRI - duloxetine Failed - 04/22/2022  8:31 AM      Failed - Cr in normal range and within 360 days    Creatinine, Ser  Date Value Ref Range Status  02/10/2022 1.21 (H) 0.44 - 1.00 mg/dL Final         Passed - eGFR is 30 or above and within 360 days    GFR calc Af Amer  Date Value Ref Range Status  04/26/2021 50  Final   GFR, Estimated  Date Value Ref Range Status  02/10/2022 44 (L) >60 mL/min Final    Comment:    (NOTE) Calculated using the CKD-EPI Creatinine Equation (2021)    GFR  Date Value Ref Range Status  12/19/2019 36.97 (L) >60.00 mL/min Final   eGFR  Date Value Ref Range Status  01/23/2022 40 (L) >59 mL/min/1.73 Final         Passed - Completed PHQ-2 or PHQ-9 in the last 360 days      Passed - Last BP in normal range    BP Readings from Last 1 Encounters:  03/27/22 122/64         Passed - Valid encounter within last 6 months    Recent Outpatient Visits           4 weeks ago Osteoarthritis of glenohumeral joint, left   [facility] Montel Culver, MD   2 months ago Osteoarthritis of glenohumeral joint, left   [facility] Montel Culver, MD   2 months ago [place], Dionne Bucy, MD   3 months ago Essential hypertension   [place], Dionne Bucy, MD   9 months ago Essential hypertension   [place], Dionne Bucy, MD       Future Appointments             In 2 weeks Zigmund Daniel, Earley Abide, MD [facility], [place]   In 3 months Sharolyn Douglas, Clance Boll, NP [facility], [place]   In 3 months Bacigalupo, Dionne Bucy, MD [facility], [place]

## 2022-04-25 ENCOUNTER — Ambulatory Visit: Payer: Self-pay
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
## 2022-04-28 ENCOUNTER — Other Ambulatory Visit: Payer: Self-pay | Admitting: Family Medicine

## 2022-04-28 ENCOUNTER — Ambulatory Visit (INDEPENDENT_AMBULATORY_CARE_PROVIDER_SITE_OTHER): Payer: Medicare Other | Admitting: Family Medicine

## 2022-04-28 ENCOUNTER — Encounter: Payer: Self-pay | Admitting: Family Medicine

## 2022-04-28 VITALS — BP 134/73 | HR 52 | Temp 97.5°F | Resp 16 | Wt 143.1 lb

## 2022-04-28 DIAGNOSIS — R0609 Other forms of dyspnea: Secondary | ICD-10-CM

## 2022-04-28 DIAGNOSIS — I2510 Atherosclerotic heart disease of native coronary artery without angina pectoris: Secondary | ICD-10-CM | POA: Diagnosis not present

## 2022-04-28 MED ORDER — ALBUTEROL SULFATE (2.5 MG/3ML) 0.083% IN NEBU: 2.5000 mg | INHALATION_SOLUTION | Freq: Four times a day (QID) | RESPIRATORY_TRACT | 1 refills | Status: DC | PRN

## 2022-04-28 NOTE — Patient Instructions (Signed)
Call [facility] and move up appt to soonest available [phone]

## 2022-04-28 NOTE — Assessment & Plan Note (Signed)
New problem No signs of asthma exacerbation and lungs are clear on exam today Her description of her dyspnea only occurring with exertion and her not being able to go and walk around stores or climb the stairs in her home anymore is concerning for a cardiac source of dyspnea especially in the setting of known CAD status post remote stenting in 2013 and her feeling like this feels similar to prior to her stenting No changes to her medication, but I did advise her to take it easy and take a rest when she does get the dyspnea We will try to get her a follow-up appointment sooner with cardiology than her regularly scheduled September appointment I did discuss red flags that would warrant emergent evaluation as well

## 2022-04-28 NOTE — Assessment & Plan Note (Signed)
See plan below Patient has known CAD that is followed by cardiology and she is now having worsening dyspnea with exertion No changes to her medications today, but we will see if she can follow-up with cardiology sooner than her regularly scheduled visit in September

## 2022-05-02 ENCOUNTER — Other Ambulatory Visit: Payer: Self-pay | Admitting: Family Medicine

## 2022-05-08 ENCOUNTER — Encounter: Payer: Self-pay | Admitting: Family Medicine

## 2022-05-08 ENCOUNTER — Ambulatory Visit (INDEPENDENT_AMBULATORY_CARE_PROVIDER_SITE_OTHER): Payer: Medicare Other | Admitting: Family Medicine

## 2022-05-08 ENCOUNTER — Telehealth: Payer: Self-pay | Admitting: Cardiovascular Disease

## 2022-05-08 ENCOUNTER — Inpatient Hospital Stay (INDEPENDENT_AMBULATORY_CARE_PROVIDER_SITE_OTHER): Payer: Medicare Other | Admitting: Radiology

## 2022-05-08 VITALS — BP 120/78 | HR 88 | Ht 62.0 in | Wt 147.8 lb

## 2022-05-08 DIAGNOSIS — M19012 Primary osteoarthritis, left shoulder: Secondary | ICD-10-CM

## 2022-05-08 DIAGNOSIS — R809 Proteinuria, unspecified: Secondary | ICD-10-CM | POA: Diagnosis not present

## 2022-05-08 DIAGNOSIS — I129 Hypertensive chronic kidney disease with stage 1 through stage 4 chronic kidney disease, or unspecified chronic kidney disease: Secondary | ICD-10-CM | POA: Diagnosis not present

## 2022-05-08 DIAGNOSIS — N1832 Chronic kidney disease, stage 3b: Secondary | ICD-10-CM | POA: Diagnosis not present

## 2022-05-08 MED ORDER — TRIAMCINOLONE ACETONIDE 40 MG/ML IJ SUSP
40.0000 mg | Freq: Once | INTRAMUSCULAR | Status: AC
  Administered 2022-05-08: 40 mg via INTRAMUSCULAR

## 2022-05-08 NOTE — Telephone Encounter (Signed)
Attempted to call Candace. No answer- I left a message to please call back.

## 2022-05-08 NOTE — Patient Instructions (Signed)
You have just been given a cortisone injection to reduce pain and inflammation. After the injection you may notice immediate relief of pain as a result of the Lidocaine. It is important to rest the area of the injection for 24 to 48 hours after the injection. There is a possibility of some temporary increased discomfort and swelling for up to 72 hours until the cortisone begins to work. If you do have pain, simply rest the joint and use ice. - Return in 6 weeks, can cancel if shoulder is doing well and reschedule for later

## 2022-05-08 NOTE — Progress Notes (Signed)
     Primary Care / Sports Medicine Office Visit  Patient Information:  Patient ID: JOVANI COLQUHOUN, female DOB: 1936-11-07 Age: 86 y.o. MRN: [id number]   SUNDY HOUCHINS is a pleasant 86 y.o. female presenting with the following:  Chief Complaint  Patient presents with   Shoulder Pain    Left, is able to move it a lot more, unable to take medication,     Vitals:   05/08/22 1039  BP: 120/78  Pulse: 88  SpO2: 98%   Vitals:   05/08/22 1039  Weight: 147 lb 12.8 oz (67 kg)  Height: '5\' 2"'$  (1.575 m)   Body mass index is 27.03 kg/m.  No results found.   Independent interpretation of notes and tests performed by another provider:   None  Procedures performed:   Procedure:  Injection of left glenohumeral joint under ultrasound guidance. Ultrasound guidance utilized for in-plane approach to left glenohumeral joint, dynamic motion of the joint visualized, no effusion noted Samsung HS60 device utilized with permanent recording / reporting. Verbal informed consent obtained and verified. Skin prepped in a sterile fashion. Ethyl chloride for topical local analgesia.  Completed without difficulty and tolerated well. Medication: triamcinolone acetonide 40 mg/mL suspension for injection 1 mL total and 2 mL lidocaine 1% without epinephrine utilized for needle placement anesthetic Advised to contact for fevers/chills, erythema, induration, drainage, or persistent bleeding.   Pertinent History, Exam, Impression, and Recommendations:   Problem List Items Addressed This Visit       Musculoskeletal and Integument   Osteoarthritis of glenohumeral joint, left - Primary    Patient presents for follow-up to left shoulder pain in the setting of glenohumeral osteoarthritis.  Of note, roughly 6 weeks prior she received intra-articular cortisone injection which has provided relief until the past few days where she has had the earliest inkling of symptom return.  She was unable to tolerate duloxetine  due to emesis and has self discontinued this after a few days.  I have discussed remaining off of this medication and we will not reattempt due to this adverse effect. Overall she reports positive response from treatment thus far and is requested to proceed with repeat ultrasound-guided glenohumeral joint injection of the left shoulder today.  We have discussed the ideal frequency of repeat injections due to risk of increasing the rate of degeneration, given risk versus benefits she has elected to proceed with the same.  I did encourage home-based exercises in an effort to prolong the duration of response however she is not amenable to performing those at this time.  We will schedule follow-up in 6 weeks for reevaluation, can contact us sooner for any issues with the shoulder.  Chronic condition, symptomatic, Rx management       Relevant Orders   [place] LIMITED JOINT SPACE STRUCTURES UP LEFT     Orders & Medications No orders of the defined types were placed in this encounter.  Orders Placed This Encounter  Procedures   [place] LIMITED JOINT SPACE STRUCTURES UP LEFT     Return in about 6 weeks (around 06/19/2022) for Procedure shoulder injection.     Montel Culver, MD   Primary Care Sports Medicine [place] [facility]

## 2022-05-08 NOTE — Telephone Encounter (Signed)
Pt c/o Shortness Of Breath: STAT if SOB developed within the last 24 hours or pt is noticeably SOB on the phone  1. Are you currently SOB (can you hear that pt is SOB on the phone)? Spoke with daughter.  2. How long have you been experiencing SOB? About two months  3. Are you SOB when sitting or when up moving around? Up moving around. Daughter states she gets SOB when she goes up a flight of stairs, even walking around the store.    4. Are you currently experiencing any other symptoms? No other symptoms.  It feels like the last time she needed a stent.

## 2022-05-08 NOTE — Addendum Note (Signed)
Addended by: Delia Heady on: 05/08/2022 03:09 PM   Modules accepted: Orders

## 2022-05-08 NOTE — Assessment & Plan Note (Addendum)
Patient presents for follow-up to left shoulder pain in the setting of glenohumeral osteoarthritis.  Of note, roughly 6 weeks prior she received intra-articular cortisone injection which has provided relief until the past few days where she has had the earliest inkling of symptom return.  She was unable to tolerate duloxetine due to emesis and has self discontinued this after a few days.  I have discussed remaining off of this medication and we will not reattempt due to this adverse effect. Overall she reports positive response from treatment thus far and is requested to proceed with repeat ultrasound-guided glenohumeral joint injection of the left shoulder today.  We have discussed the ideal frequency of repeat injections due to risk of increasing the rate of degeneration, given risk versus benefits she has elected to proceed with the same.  I did encourage home-based exercises in an effort to prolong the duration of response however she is not amenable to performing those at this time.  We will schedule follow-up in 6 weeks for reevaluation, can contact us sooner for any issues with the shoulder.  Chronic condition, symptomatic, Rx management

## 2022-05-09 ENCOUNTER — Ambulatory Visit: Payer: Medicare Other | Admitting: Cardiovascular Disease

## 2022-05-09 NOTE — Telephone Encounter (Signed)
Left voicemail message to call back for review of concerns and/or send My Chart message and I will be happy to call back.

## 2022-05-10 NOTE — Telephone Encounter (Signed)
3rd attempt to reach out to Andrea Hart. No answer- I left a message to please call back if needed. Otherwise, we will see Andrea Hart at her upcoming appointment.

## 2022-05-15 NOTE — Progress Notes (Signed)
Cardiology Office Note    Date:  05/16/2022   ID:  Andrea Hart, DOB [birth year], MRN [id number]  PCP:  Virginia Crews, MD  Cardiologist:  Kathlyn Sacramento, MD  Electrophysiologist:  None   Chief Complaint: Progressive exertional dyspnea  History of Present Illness:   Andrea Hart is a 86 y.o. female with history of CAD status post stenting in 2013, CKD stage III, prior CVA x2, HTN, HLD, GI bleeding in 02/2021, PVCs, hypothyroidism, and asthma who presents for evaluation of exertional dyspnea concerning for anginal equivalent.  She underwent stenting of an unknown vessel in [place], [place] in 2013.  In 2017, she suffered a stroke.  ILR did not show any evidence of A-fib, and she has been maintained on aspirin and clopidogrel since.  In 01/2016, she complained of presyncope and underwent event monitoring.  This showed frequent PVCs with a 10% burden.  She reported a long history of PVCs and has been managed with beta-blocker therapy.  She has history of difficult to control hypertension with renal artery ultrasound in 07/2020 showing no evidence of RAS.  Carotid artery ultrasound in 11/2020 showed moderate, nonobstructive bilateral left greater than right ICA disease.  She was admitted in 02/2021 with rectal bleeding with GI feeling the presentation was most consistent with an acute diverticular bleed.  Aspirin and clopidogrel were held and she underwent outpatient colonoscopy in 04/2021 which demonstrated nonbleeding internal hemorrhoids, moderate diverticulosis without evidence of diverticular bleeding.  She was subsequently resumed on aspirin and clopidogrel.  She was last seen in the office in 02/2022 and was without symptoms of angina or decompensation.  She remained active with multiple social events.  Most recent carotid artery ultrasound from 02/2022 demonstrated 1 to 39% RICA stenosis and 60 to 11% LICA stenosis (previously 50 to 69%).  She comes in accompanied by her daughter today and notes  a 9 to 75-monthhistory of exertional dyspnea that has become more pronounced here lately.  Symptoms feel similar, though not as severe, as what she experienced leading up to her PCI in 2013.  She has been without frank angina, though does not recall having angina leading up to her PCI.  Symptoms improved with rest.  Her dyspnea is significantly affecting her functional status and quality of life.  No palpitations, dizziness, presyncope, or syncope.  No lower extremity swelling or orthopnea.  No early satiety or PND.  She is adherent to all medications and denies any falls or bleeding symptoms.   Labs independently reviewed: 02/2022 - Hgb 12.1, PLT 278, potassium 4.4, BUN 24, serum creatinine 1.21 01/2022 - A1c 6.0, TSH normal, albumin 4.4, AST/ALT normal 07/2021 - TC 137, TG 128, HDL 50, LDL 64  Past Medical History:  Diagnosis Date   Abdominal hernia    Arthritis    Asthma    [facility] arterial disease ([facility]    a. 11/2020 Carotid U/S: Less than 565%RICA, 579-03%LICA.   CKD (chronic kidney disease), stage III ([facility]    Coronary artery disease    [facility]   GI bleeding 02/02/2021   Hyperlipidemia    Hypertension    Hypothyroidism    PVC's (premature ventricular contractions)    a. 01/2020 Zio: RSR, 70, frequent PVCs with 10.5% burden.   Stroke ([facility]    a. 2017 - ILR did not show afib.    Past Surgical History:  Procedure Laterality Date  ABDOMINAL HYSTERECTOMY     APPENDECTOMY     CATARACT EXTRACTION Bilateral    CHOLECYSTECTOMY     COLONOSCOPY WITH PROPOFOL N/A 04/06/2021   Procedure: COLONOSCOPY WITH PROPOFOL;  Surgeon: Toledo, Benay Pike, MD;  Location: [facility] ENDOSCOPY;  Service: Gastroenterology;  Laterality: N/A;   CORONARY ANGIOPLASTY  2013   1xStent [facility] [place].   EYE SURGERY     HAND SURGERY Right    LOOP RECORDER IMPLANT     NISSEN FUNDOPLICATION  0347   REPLACEMENT TOTAL KNEE Bilateral  2017   TOTAL HIP ARTHROPLASTY Right    TOTAL VAGINAL HYSTERECTOMY      Current Medications: Current Meds  Medication Sig   ADVAIR DISKUS 500-50 MCG/ACT AEPB INHALE 1 PUFF INTO THE LUNGS TWICE A DAY   albuterol (VENTOLIN HFA) 108 (90 Base) MCG/ACT inhaler Inhale 2 puffs into the lungs every 4 (four) hours as needed for wheezing or shortness of breath.   amLODipine (NORVASC) 10 MG tablet Take 1 tablet (10 mg total) by mouth daily.   aspirin EC 81 MG tablet Take 81 mg by mouth daily.   budesonide-formoterol (SYMBICORT) 160-4.5 MCG/ACT inhaler Inhale 2 puffs into the lungs 2 (two) times daily.   cetirizine (ZYRTEC) 10 MG tablet Take 1 tablet (10 mg total) by mouth daily.   clopidogrel (PLAVIX) 75 MG tablet Take 1 tablet (75 mg total) by mouth daily.   fluticasone (FLONASE) 50 MCG/ACT nasal spray Place 2 sprays into both nostrils daily.   levalbuterol (XOPENEX) 1.25 MG/3ML nebulizer solution Take 1.25 mg by nebulization every 6 (six) hours as needed for wheezing.   levothyroxine (SYNTHROID) 75 MCG tablet Take 1 tablet (75 mcg total) by mouth daily before breakfast.   meclizine (ANTIVERT) 25 MG tablet Take 0.5 tablets (12.5 mg total) by mouth 3 (three) times daily as needed for dizziness or nausea.   montelukast (SINGULAIR) 10 MG tablet Take 1 tablet (10 mg total) by mouth at bedtime.   omeprazole (PRILOSEC) 40 MG capsule Take 1 capsule (40 mg total) by mouth 2 (two) times daily.   rosuvastatin (CRESTOR) 20 MG tablet TAKE 1 TABLET BY MOUTH EVERY DAY   Turmeric 500 MG TABS Take 1,000 mg by mouth daily.   valsartan (DIOVAN) 320 MG tablet Take 1 tablet (320 mg total) by mouth daily.   [DISCONTINUED] carvedilol (COREG) 12.5 MG tablet Take 1 tablet (12.5 mg total) by mouth 2 (two) times daily.    Allergies:   Lidocaine   Social History   Socioeconomic History   Marital status: Widowed    Spouse name: Not on file   Number of children: 2   Years of education: Not on file   Highest education  level: Some college, no degree  Occupational History    Comment: retired Science writer  Tobacco Use   Smoking status: Never   Smokeless tobacco: Never  Vaping Use   Vaping Use: Never used  Substance and Sexual Activity   Alcohol use: Not Currently    Comment: Occasional   Drug use: Never   Sexual activity: Never    Birth control/protection: Surgical  Other Topics Concern   Not on file  Social History Narrative   Moved here in 2019 , had been living in [place].    Born here, raised daughters here.    Lives with daughter now, Odin.   Lost one daughter in car wreck.    Social Determinants of Health   Financial Resource Strain: Low Risk  (11/29/2021)   Overall  Financial Resource Strain (CARDIA)    Difficulty of Paying Living Expenses: Not hard at all  Food Insecurity: No Food Insecurity (11/29/2021)   Hunger Vital Sign    Worried About Running Out of Food in the Last Year: Never true    Ran Out of Food in the Last Year: Never true  Transportation Needs: No Transportation Needs (11/29/2021)   PRAPARE - Hydrologist (Medical): No    Lack of Transportation (Non-Medical): No  Physical Activity: Insufficiently Active (11/29/2021)   Exercise Vital Sign    Days of Exercise per Week: 2 days    Minutes of Exercise per Session: 20 min  Stress: No Stress Concern Present (11/29/2021)   [place]    Feeling of Stress : Not at all  Social Connections: Moderately Integrated (11/29/2021)   Social Connection and Isolation Panel [NHANES]    Frequency of Communication with Friends and Family: Three times a week    Frequency of Social Gatherings with Friends and Family: Twice a week    Attends Religious Services: More than 4 times per year    Active Member of [employer] or Organizations: Yes    Attends Archivist Meetings: More than 4 times per year    Marital Status: Widowed     Family  History:  The patient's family history includes Heart Problems in her brother, father, and mother; Heart attack in her brother and mother. There is no history of Colon cancer or Breast cancer.  ROS:   12-point review of systems is negative unless otherwise noted in the HPI.   EKGs/Labs/Other Studies Reviewed:    Studies reviewed were summarized above. The additional studies were reviewed today:  Carotid artery ultrasound 02/23/2022: Summary:  Right Carotid: Velocities in the right ICA are consistent with a 1-39%  stenosis. Non-hemodynamically significant plaque <50% noted in the  CCA. The ECA appears <50% stenosed.   Left Carotid: Velocities in the left ICA are consistent with a 60-79%  stenosis. Non-hemodynamically significant plaque <50% noted in the  CCA. The ECA appears >50% stenosed.     The bilateral lobes of the thyroid had a globuated  appearance   Suggest follow up study in 12 months.  __________  Renal artery ultrasound 07/16/2020: Summary:  Largest Aortic Diameter: 2.0 cm     Renal:     Right: Normal size right kidney. Normal right Resisitive Index.         Normal cortical thickness of right kidney. No evidence of         right renal artery stenosis. RRV flow present.  Left:  LRV flow present. No evidence of left renal artery stenosis.         Normal size of left kidney. Normal left Resistive Index.         Normal cortical thickness of the left kidney. __________  Outpatient cardiac monitoring 01/2020: Normal sinus rhythm with an average heart rate of 70 bpm. 1 short run of SVT lasted 4 beats. Frequent PVCs with a burden of 10.5%.  Ventricular bigeminy and trigeminy were present.    EKG:  EKG is ordered today.  The EKG ordered today demonstrates sinus bradycardia, 49 bpm, no acute ST-T changes  Recent Labs: 01/23/2022: ALT 10; TSH 2.210 05/16/2022: BUN 32; Creatinine, Ser 1.40; Hemoglobin 11.5; Platelets 218; Potassium 4.9; Sodium 137  Recent Lipid Panel     Component Value Date/Time   CHOL 137 07/21/2021 1343  TRIG 128 07/21/2021 1343   HDL 50 07/21/2021 1343   CHOLHDL 2.7 07/21/2021 1343   CHOLHDL 3 12/19/2019 0957   VLDL 36.4 12/19/2019 0957   LDLCALC 64 07/21/2021 1343    PHYSICAL EXAM:    VS:  BP 126/64 (BP Location: Left Arm, Patient Position: Sitting, Cuff Size: Normal)   Pulse (!) 49   Ht '5\' 2"'$  (1.575 m)   Wt 139 lb 12.8 oz (63.4 kg)   SpO2 98%   BMI 25.57 kg/m   BMI: Body mass index is 25.57 kg/m.  Physical Exam Vitals reviewed.  Constitutional:      Appearance: She is well-developed.  HENT:     Head: Normocephalic and atraumatic.  Eyes:     General:        Right eye: No discharge.        Left eye: No discharge.  Neck:     Vascular: No JVD.  Cardiovascular:     Rate and Rhythm: Regular rhythm. Bradycardia present.     Pulses:          Carotid pulses are  on the left side with bruit.      Posterior tibial pulses are 2+ on the right side and 2+ on the left side.     Heart sounds: Normal heart sounds, S1 normal and S2 normal. Heart sounds not distant. No midsystolic click and no opening snap. No murmur heard.    No friction rub.  Pulmonary:     Effort: Pulmonary effort is normal. No respiratory distress.     Breath sounds: Normal breath sounds. No decreased breath sounds, wheezing or rales.  Chest:     Chest wall: No tenderness.  Abdominal:     General: There is no distension.     Palpations: Abdomen is soft.     Tenderness: There is no abdominal tenderness.  Musculoskeletal:     Cervical back: Normal range of motion.     Right lower leg: No edema.     Left lower leg: No edema.  Skin:    General: Skin is warm and dry.     Nails: There is no clubbing.  Neurological:     Mental Status: She is alert and oriented to person, place, and time.  Psychiatric:        Speech: Speech normal.        Behavior: Behavior normal.        Thought Content: Thought content normal.        Judgment: Judgment normal.      Wt Readings from Last 3 Encounters:  05/16/22 139 lb 12.8 oz (63.4 kg)  05/08/22 147 lb 12.8 oz (67 kg)  04/28/22 143 lb 1.6 oz (64.9 kg)     ASSESSMENT & PLAN:   CAD involving the native coronary arteries with progressive exertional dyspnea concerning for anginal equivalent: Currently asymptomatic at rest.  We did have a discussion regarding noninvasive versus invasive ischemic modalities including the risks and benefits of each as well as limitations.  We have elected to proceed directly with cardiac catheterization given progressive nature of symptoms, and in the context of these symptoms feeling very similar to what she experienced in 2013 leading up to her PCI.  Continue aggressive risk factor modification and secondary prevention including aspirin, clopidogrel, amlodipine, carvedilol, and rosuvastatin.  HTN: Blood pressure is well controlled in the office today.  We will continue current medical therapy outside of decreasing carvedilol as outlined below given sinus bradycardia.  HLD: LDL 64.  She remains on rosuvastatin 20 mg.  CKD stage II-III: Stable on recent check.  History of GI bleed/iron deficiency anemia: No symptoms concerning for recurrence of bleed.  Hemoglobin stable.  PVCs: Quiescent.  She remains on carvedilol.  Carotid artery disease: Most recent carotid artery ultrasound from 02/2022 demonstrated progression of LICA stenosis of 60 to 79% (previously 50 to 69%).  Follow-up imaging in 1 year.  Continue current medical therapy.  History of CVA: No new deficits.  She remains on aspirin, clopidogrel, and rosuvastatin.  Sinus bradycardia: Decrease carvedilol to 6.25 mg twice daily.   Shared Decision Making/Informed Consent{  The risks [stroke (1 in 1000), death (1 in 1000), kidney failure [usually temporary] (1 in 500), bleeding (1 in 200), allergic reaction [possibly serious] (1 in 200)], benefits (diagnostic support and management of coronary artery disease) and  alternatives of a cardiac catheterization were discussed in detail with Ms. Debruler and she is willing to proceed.     Disposition: F/u with Dr. Fletcher Anon or an APP in 2 to 3 weeks.   Medication Adjustments/Labs and Tests Ordered: Current medicines are reviewed at length with the patient today.  Concerns regarding medicines are outlined above. Medication changes, Labs and Tests ordered today are summarized above and listed in the Patient Instructions accessible in Encounters.   Signed, Christell Faith, PA-C 05/16/2022 12:10 PM     [address], [place] [id number] [phone]

## 2022-05-15 NOTE — H&P (View-Only) (Signed)
Cardiology Office Note    Date:  05/16/2022   ID:  Andrea Hart, DOB 26-Apr-1936, MRN [id number]  PCP:  Virginia Crews, MD  Cardiologist:  Kathlyn Sacramento, MD  Electrophysiologist:  None   Chief Complaint: Progressive exertional dyspnea  History of Present Illness:   Andrea Hart is a 86 y.o. female with history of CAD status post stenting in 2013, CKD stage III, prior CVA x2, HTN, HLD, GI bleeding in 02/2021, PVCs, hypothyroidism, and asthma who presents for evaluation of exertional dyspnea concerning for anginal equivalent.  She underwent stenting of an unknown vessel in [place] in [place].  In 2017, she suffered a stroke.  ILR did not show any evidence of A-fib, and she has been maintained on aspirin and clopidogrel since.  In 01/2016, she complained of presyncope and underwent event monitoring.  This showed frequent PVCs with a 10% burden.  She reported a long history of PVCs and has been managed with beta-blocker therapy.  She has history of difficult to control hypertension with renal artery ultrasound in 07/2020 showing no evidence of RAS.  Carotid artery ultrasound in 11/2020 showed moderate, nonobstructive bilateral left greater than right ICA disease.  She was admitted in 02/2021 with rectal bleeding with GI feeling the presentation was most consistent with an acute diverticular bleed.  Aspirin and clopidogrel were held and she underwent outpatient colonoscopy in 04/2021 which demonstrated nonbleeding internal hemorrhoids, moderate diverticulosis without evidence of diverticular bleeding.  She was subsequently resumed on aspirin and clopidogrel.  She was last seen in the office in 02/2022 and was without symptoms of angina or decompensation.  She remained active with multiple social events.  Most recent carotid artery ultrasound from 02/2022 demonstrated 1 to 39% RICA stenosis and 60 to 44% LICA stenosis (previously 50 to 69%).  She comes in accompanied by her daughter today and notes  a 9 to 17-monthhistory of exertional dyspnea that has become more pronounced here lately.  Symptoms feel similar, though not as severe, as what she experienced leading up to her PCI in 2013.  She has been without frank angina, though does not recall having angina leading up to her PCI.  Symptoms improved with rest.  Her dyspnea is significantly affecting her functional status and quality of life.  No palpitations, dizziness, presyncope, or syncope.  No lower extremity swelling or orthopnea.  No early satiety or PND.  She is adherent to all medications and denies any falls or bleeding symptoms.   Labs independently reviewed: 02/2022 - Hgb 12.1, PLT 278, potassium 4.4, BUN 24, serum creatinine 1.21 01/2022 - A1c 6.0, TSH normal, albumin 4.4, AST/ALT normal 07/2021 - TC 137, TG 128, HDL 50, LDL 64  Past Medical History:  Diagnosis Date   Abdominal hernia    Arthritis    Asthma    [place]    a. 11/2020 Carotid U/S: Less than 501%RICA, 502-72%LICA.   [place]D (chronic [place] disease[place]    Coronary artery disease    [facility]   GI bleeding 02/02/2021   Hyperlipidemia    Hypertension    Hypothyroidism    PVC's (premature ventricular contractions)    a. 01/2020 Zio: RSR, 70, frequent PVCs with 10.5% burden.   Stroke ([facility]    a. 2017 - ILR did not show afib.    Past Surgical History:  Procedure Laterality Date  ABDOMINAL HYSTERECTOMY     APPENDECTOMY     CATARACT EXTRACTION Bilateral    CHOLECYSTECTOMY     COLONOSCOPY WITH PROPOFOL N/A 04/06/2021   Procedure: COLONOSCOPY WITH PROPOFOL;  Surgeon: Toledo, Benay Pike, MD;  Location: [facility] ENDOSCOPY;  Service: Gastroenterology;  Laterality: N/A;   CORONARY ANGIOPLASTY  2013   1xStent [facility] [place].   EYE SURGERY     HAND SURGERY Right    LOOP RECORDER IMPLANT     NISSEN FUNDOPLICATION  8119   REPLACEMENT TOTAL KNEE Bilateral  2017   TOTAL HIP ARTHROPLASTY Right    TOTAL VAGINAL HYSTERECTOMY      Current Medications: Current Meds  Medication Sig   ADVAIR DISKUS 500-50 MCG/ACT AEPB INHALE 1 PUFF INTO THE LUNGS TWICE A DAY   albuterol (VENTOLIN HFA) 108 (90 Base) MCG/ACT inhaler Inhale 2 puffs into the lungs every 4 (four) hours as needed for wheezing or shortness of breath.   amLODipine (NORVASC) 10 MG tablet Take 1 tablet (10 mg total) by mouth daily.   aspirin EC 81 MG tablet Take 81 mg by mouth daily.   budesonide-formoterol (SYMBICORT) 160-4.5 MCG/ACT inhaler Inhale 2 puffs into the lungs 2 (two) times daily.   cetirizine (ZYRTEC) 10 MG tablet Take 1 tablet (10 mg total) by mouth daily.   clopidogrel (PLAVIX) 75 MG tablet Take 1 tablet (75 mg total) by mouth daily.   fluticasone (FLONASE) 50 MCG/ACT nasal spray Place 2 sprays into both nostrils daily.   levalbuterol (XOPENEX) 1.25 MG/3ML nebulizer solution Take 1.25 mg by nebulization every 6 (six) hours as needed for wheezing.   levothyroxine (SYNTHROID) 75 MCG tablet Take 1 tablet (75 mcg total) by mouth daily before breakfast.   meclizine (ANTIVERT) 25 MG tablet Take 0.5 tablets (12.5 mg total) by mouth 3 (three) times daily as needed for dizziness or nausea.   montelukast (SINGULAIR) 10 MG tablet Take 1 tablet (10 mg total) by mouth at bedtime.   omeprazole (PRILOSEC) 40 MG capsule Take 1 capsule (40 mg total) by mouth 2 (two) times daily.   rosuvastatin (CRESTOR) 20 MG tablet TAKE 1 TABLET BY MOUTH EVERY DAY   Turmeric 500 MG TABS Take 1,000 mg by mouth daily.   valsartan (DIOVAN) 320 MG tablet Take 1 tablet (320 mg total) by mouth daily.   [DISCONTINUED] carvedilol (COREG) 12.5 MG tablet Take 1 tablet (12.5 mg total) by mouth 2 (two) times daily.    Allergies:   Lidocaine   Social History   Socioeconomic History   Marital status: Widowed    Spouse name: Not on file   Number of children: 2   Years of education: Not on file   Highest education  level: Some college, no degree  Occupational History    Comment: retired Science writer  Tobacco Use   Smoking status: Never   Smokeless tobacco: Never  Vaping Use   Vaping Use: Never used  Substance and Sexual Activity   Alcohol use: Not Currently    Comment: Occasional   Drug use: Never   Sexual activity: Never    Birth control/protection: Surgical  Other Topics Concern   Not on file  Social History Narrative   Moved here in 2019 , had been living in [place].    Born here, raised daughters here.    Lives with daughter now, [place].   Lost one daughter in car wreck.    Social Determinants of Health   Financial Resource Strain: Low Risk  (11/29/2021)   Overall  Financial Resource Strain (CARDIA)    Difficulty of Paying Living Expenses: Not hard at all  Food Insecurity: No Food Insecurity (11/29/2021)   Hunger Vital Sign    Worried About Running Out of Food in the Last Year: Never true    Ran Out of Food in the Last Year: Never true  Transportation Needs: No Transportation Needs (11/29/2021)   PRAPARE - Hydrologist (Medical): No    Lack of Transportation (Non-Medical): No  Physical Activity: Insufficiently Active (11/29/2021)   Exercise Vital Sign    Days of Exercise per Week: 2 days    Minutes of Exercise per Session: 20 min  Stress: No Stress Concern Present (11/29/2021)   [place]    Feeling of Stress : Not at all  Social Connections: Moderately Integrated (11/29/2021)   Social Connection and Isolation Panel [NHANES]    Frequency of Communication with Friends and Family: Three times a week    Frequency of Social Gatherings with Friends and Family: Twice a week    Attends Religious Services: More than 4 times per year    Active Member of [employer] or Organizations: Yes    Attends Archivist Meetings: More than 4 times per year    Marital Status: Widowed     Family  History:  The patient's family history includes Heart Problems in her brother, father, and mother; Heart attack in her brother and mother. There is no history of Colon cancer or Breast cancer.  ROS:   12-point review of systems is negative unless otherwise noted in the HPI.   EKGs/Labs/Other Studies Reviewed:    Studies reviewed were summarized above. The additional studies were reviewed today:  Carotid artery ultrasound 02/23/2022: Summary:  Right Carotid: Velocities in the right ICA are consistent with a 1-39%  stenosis. Non-hemodynamically significant plaque <50% noted in the  CCA. The ECA appears <50% stenosed.   Left Carotid: Velocities in the left ICA are consistent with a 60-79%  stenosis. Non-hemodynamically significant plaque <50% noted in the  CCA. The ECA appears >50% stenosed.     The bilateral lobes of the thyroid had a globuated  appearance   Suggest follow up study in 12 months.  __________  Renal artery ultrasound 07/16/2020: Summary:  Largest Aortic Diameter: 2.0 cm     Renal:     Right: Normal size right kidney. Normal right Resisitive Index.         Normal cortical thickness of right kidney. No evidence of         right renal artery stenosis. RRV flow present.  Left:  LRV flow present. No evidence of left renal artery stenosis.         Normal size of left kidney. Normal left Resistive Index.         Normal cortical thickness of the left kidney. __________  Outpatient cardiac monitoring 01/2020: Normal sinus rhythm with an average heart rate of 70 bpm. 1 short run of SVT lasted 4 beats. Frequent PVCs with a burden of 10.5%.  Ventricular bigeminy and trigeminy were present.    EKG:  EKG is ordered today.  The EKG ordered today demonstrates sinus bradycardia, 49 bpm, no acute ST-T changes  Recent Labs: 01/23/2022: ALT 10; TSH 2.210 05/16/2022: BUN 32; Creatinine, Ser 1.40; Hemoglobin 11.5; Platelets 218; Potassium 4.9; Sodium 137  Recent Lipid Panel     Component Value Date/Time   CHOL 137 07/21/2021 1343  TRIG 128 07/21/2021 1343   HDL 50 07/21/2021 1343   CHOLHDL 2.7 07/21/2021 1343   CHOLHDL 3 12/19/2019 0957   VLDL 36.4 12/19/2019 0957   LDLCALC 64 07/21/2021 1343    PHYSICAL EXAM:    VS:  BP 126/64 (BP Location: Left Arm, Patient Position: Sitting, Cuff Size: Normal)   Pulse (!) 49   Ht '5\' 2"'$  (1.575 m)   Wt 139 lb 12.8 oz (63.4 kg)   SpO2 98%   BMI 25.57 kg/m   BMI: Body mass index is 25.57 kg/m.  Physical Exam Vitals reviewed.  Constitutional:      Appearance: She is well-developed.  HENT:     Head: Normocephalic and atraumatic.  Eyes:     General:        Right eye: No discharge.        Left eye: No discharge.  Neck:     Vascular: No JVD.  Cardiovascular:     Rate and Rhythm: Regular rhythm. Bradycardia present.     Pulses:          Carotid pulses are  on the left side with bruit.      Posterior tibial pulses are 2+ on the right side and 2+ on the left side.     Heart sounds: Normal heart sounds, S1 normal and S2 normal. Heart sounds not distant. No midsystolic click and no opening snap. No murmur heard.    No friction rub.  Pulmonary:     Effort: Pulmonary effort is normal. No respiratory distress.     Breath sounds: Normal breath sounds. No decreased breath sounds, wheezing or rales.  Chest:     Chest wall: No tenderness.  Abdominal:     General: There is no distension.     Palpations: Abdomen is soft.     Tenderness: There is no abdominal tenderness.  Musculoskeletal:     Cervical back: Normal range of motion.     Right lower leg: No edema.     Left lower leg: No edema.  Skin:    General: Skin is warm and dry.     Nails: There is no clubbing.  Neurological:     Mental Status: She is alert and oriented to person, place, and time.  Psychiatric:        Speech: Speech normal.        Behavior: Behavior normal.        Thought Content: Thought content normal.        Judgment: Judgment normal.      Wt Readings from Last 3 Encounters:  05/16/22 139 lb 12.8 oz (63.4 kg)  05/08/22 147 lb 12.8 oz (67 kg)  04/28/22 143 lb 1.6 oz (64.9 kg)     ASSESSMENT & PLAN:   CAD involving the native coronary arteries with progressive exertional dyspnea concerning for anginal equivalent: Currently asymptomatic at rest.  We did have a discussion regarding noninvasive versus invasive ischemic modalities including the risks and benefits of each as well as limitations.  We have elected to proceed directly with cardiac catheterization given progressive nature of symptoms, and in the context of these symptoms feeling very similar to what she experienced in 2013 leading up to her PCI.  Continue aggressive risk factor modification and secondary prevention including aspirin, clopidogrel, amlodipine, carvedilol, and rosuvastatin.  HTN: Blood pressure is well controlled in the office today.  We will continue current medical therapy outside of decreasing carvedilol as outlined below given sinus bradycardia.  HLD: LDL 64.  She remains on rosuvastatin 20 mg.  CKD stage II-III: Stable on recent check.  History of GI bleed/iron deficiency anemia: No symptoms concerning for recurrence of bleed.  Hemoglobin stable.  PVCs: Quiescent.  She remains on carvedilol.  Carotid artery disease: Most recent carotid artery ultrasound from 02/2022 demonstrated progression of LICA stenosis of 60 to 79% (previously 50 to 69%).  Follow-up imaging in 1 year.  Continue current medical therapy.  History of CVA: No new deficits.  She remains on aspirin, clopidogrel, and rosuvastatin.  Sinus bradycardia: Decrease carvedilol to 6.25 mg twice daily.   Shared Decision Making/Informed Consent{  The risks [stroke (1 in 1000), death (1 in 1000), kidney failure [usually temporary] (1 in 500), bleeding (1 in 200), allergic reaction [possibly serious] (1 in 200)], benefits (diagnostic support and management of coronary artery disease) and  alternatives of a cardiac catheterization were discussed in detail with Andrea Hart and she is willing to proceed.     Disposition: F/u with Dr. Fletcher Anon or an APP in 2 to 3 weeks.   Medication Adjustments/Labs and Tests Ordered: Current medicines are reviewed at length with the patient today.  Concerns regarding medicines are outlined above. Medication changes, Labs and Tests ordered today are summarized above and listed in the Patient Instructions accessible in Encounters.   Signed, Christell Faith, PA-C 05/16/2022 12:10 PM     [place] [address] [place], [place] [address] [phone]

## 2022-05-16 ENCOUNTER — Ambulatory Visit (INDEPENDENT_AMBULATORY_CARE_PROVIDER_SITE_OTHER): Payer: Medicare Other | Admitting: Physician Assistant

## 2022-05-16 ENCOUNTER — Encounter: Payer: Self-pay | Admitting: Physician Assistant

## 2022-05-16 ENCOUNTER — Telehealth: Payer: Self-pay

## 2022-05-16 ENCOUNTER — Other Ambulatory Visit
Admission: RE | Admit: 2022-05-16 | Discharge: 2022-05-16 | Disposition: A | Discharge status: Home / Self Care | Payer: Medicare Other | Attending: Physician Assistant | Admitting: Physician Assistant

## 2022-05-16 VITALS — BP 126/64 | HR 49 | Ht 62.0 in | Wt 139.8 lb

## 2022-05-16 DIAGNOSIS — R001 Bradycardia, unspecified: Secondary | ICD-10-CM

## 2022-05-16 DIAGNOSIS — I2510 Atherosclerotic heart disease of native coronary artery without angina pectoris: Secondary | ICD-10-CM | POA: Diagnosis not present

## 2022-05-16 DIAGNOSIS — R0609 Other forms of dyspnea: Secondary | ICD-10-CM

## 2022-05-16 DIAGNOSIS — I493 Ventricular premature depolarization: Secondary | ICD-10-CM

## 2022-05-16 DIAGNOSIS — Z8719 Personal history of other diseases of the digestive system: Secondary | ICD-10-CM

## 2022-05-16 DIAGNOSIS — Z8673 Personal history of transient ischemic attack (TIA), and cerebral infarction without residual deficits: Secondary | ICD-10-CM

## 2022-05-16 DIAGNOSIS — I200 Unstable angina: Secondary | ICD-10-CM | POA: Diagnosis not present

## 2022-05-16 DIAGNOSIS — I25118 Atherosclerotic heart disease of native coronary artery with other forms of angina pectoris: Secondary | ICD-10-CM | POA: Diagnosis not present

## 2022-05-16 DIAGNOSIS — I208 Other forms of angina pectoris: Secondary | ICD-10-CM

## 2022-05-16 DIAGNOSIS — E785 Hyperlipidemia, unspecified: Secondary | ICD-10-CM

## 2022-05-16 DIAGNOSIS — N182 Chronic kidney disease, stage 2 (mild): Secondary | ICD-10-CM | POA: Diagnosis not present

## 2022-05-16 DIAGNOSIS — I10 Essential (primary) hypertension: Secondary | ICD-10-CM | POA: Diagnosis not present

## 2022-05-16 LAB — CBC
HCT: 35 % — ABNORMAL LOW (ref 36.0–46.0)
Hemoglobin: 11.5 g/dL — ABNORMAL LOW (ref 12.0–15.0)
MCH: 30.1 pg (ref 26.0–34.0)
MCHC: 32.9 g/dL (ref 30.0–36.0)
MCV: 91.6 fL (ref 80.0–100.0)
Platelets: 218 10*3/uL (ref 150–400)
RBC: 3.82 MIL/uL — ABNORMAL LOW (ref 3.87–5.11)
RDW: 13.7 % (ref 11.5–15.5)
WBC: 10.3 10*3/uL (ref 4.0–10.5)
nRBC: 0 % (ref 0.0–0.2)

## 2022-05-16 LAB — BASIC METABOLIC PANEL
Anion gap: 5 (ref 5–15)
BUN: 32 mg/dL — ABNORMAL HIGH (ref 8–23)
CO2: 20 mmol/L — ABNORMAL LOW (ref 22–32)
Calcium: 8.8 mg/dL — ABNORMAL LOW (ref 8.9–10.3)
Chloride: 112 mmol/L — ABNORMAL HIGH (ref 98–111)
Creatinine, Ser: 1.4 mg/dL — ABNORMAL HIGH (ref 0.44–1.00)
GFR, Estimated: 37 mL/min — ABNORMAL LOW (ref >60)
Glucose, Bld: 105 mg/dL — ABNORMAL HIGH (ref 70–99)
Potassium: 4.9 mmol/L (ref 3.5–5.1)
Sodium: 137 mmol/L (ref 135–145)

## 2022-05-16 MED ORDER — CARVEDILOL 6.25 MG PO TABS: 6.2500 mg | ORAL_TABLET | Freq: Two times a day (BID) | ORAL | Status: DC

## 2022-05-16 NOTE — Patient Instructions (Signed)
Medication Instructions:   Your physician has recommended you make the following change in your medication:   DECREASE Coreg (carvedilol) 6.25 mg TWICE daily - You may cut your current tablet in half for total of (6.25 mg)  *If you need a refill on your cardiac medications before your next appointment, please call your pharmacy*   Lab Work:  Today at the medical mall at [facility]: BMET, [place]  -  Please go to the [place] at [place] in at the Registration Desk: 1st desk to the right, past the screening table  If you have labs (blood work) drawn today and your tests are completely normal, you will receive your results only by: [place] (if you have MyChart) OR A paper copy in the mail If you have any lab test that is abnormal or we need to change your treatment, we will call you to review the results.   Testing/Procedures:  You are scheduled for a Cardiac Catheterization on Monday, June 19 with Dr. Kathlyn Sacramento.  1. Please arrive at the [place] [facility] hospital at 9:30 AM (This time is one hour before your procedure to ensure your preparation). Free valet parking service is available.   Special note: Every effort is made to have your procedure done on time. Please understand that emergencies sometimes delay scheduled procedures.  2. Diet: Do not eat solid foods after midnight.  You may have clear liquids until 5 AM upon the day of the procedure.  3. Labs: CBC, BMET to be completed today  4. Medication instructions in preparation for your procedure:  On the morning of your procedure, take Aspirin and Plavix/Clopidogrel and any morning medicines NOT listed above.  You may use sips of water.  5. Plan to go home the same day, you will only stay overnight if medically necessary. 6. You MUST have a responsible adult to drive you home. 7. An adult MUST be with you the first 24 hours after you arrive home. 8. Bring a current list of your medications, and the  last time and date medication taken. 9. Bring ID and current insurance cards. 10.Please wear clothes that are easy to get on and off and wear slip-on shoes.  Thank you for allowing [place] to care for you!   -- [facility]    Follow-Up: At [facility], you and your health needs are our priority.  As part of our continuing mission to provide you with exceptional heart care, we have created designated Provider Care Teams.  These Care Teams include your primary Cardiologist (physician) and Advanced Practice Providers (APPs -  Physician Assistants and Nurse Practitioners) who all work together to provide you with the care you need, when you need it.  We recommend signing up for the patient portal called "MyChart".  Sign up information is provided on this After Visit Summary.  MyChart is used to connect with patients for Virtual Visits (Telemedicine).  Patients are able to view lab/test results, encounter notes, upcoming appointments, etc.  Non-urgent messages can be sent to your provider as well.   To learn more about what you can do with MyChart, go to [email or web address].    Your next appointment:   2 - 3 week(s)  The format for your next appointment:   In Person  Provider:   You may see Kathlyn Sacramento, MD or one of the following Advanced Practice Providers on your designated Care Team:    Christell Faith, [place]  Important Information About [place]

## 2022-05-16 NOTE — Telephone Encounter (Signed)
Spoke w/ pt and daughter. Advised them of Ryan's recommendations. They verbalize understanding and are appreciative of the call.

## 2022-05-16 NOTE — Telephone Encounter (Signed)
Left message on daughter's # to call back.

## 2022-05-16 NOTE — Telephone Encounter (Signed)
-----   Message from Rise Mu, PA-C sent at 05/16/2022 12:46 PM EDT ----- Pre cath labs are overall stable with a slight decline in her renal function along with mildly elevated chloride and mildly low CO2, indicative of volume depletion  Potassium is high-normal Blood count mildly low, though stable  Recommendations: -Increase water intake -Hold spironolactone for now in an effort to minimize hyperkalemia

## 2022-05-16 NOTE — Telephone Encounter (Signed)
Lmom for pt to call back. 

## 2022-05-18 ENCOUNTER — Telehealth: Payer: Self-pay | Admitting: Cardiovascular Disease

## 2022-05-18 NOTE — Telephone Encounter (Signed)
Called Central scheduling and moved left  heart cath to Wednesday with Dr. Fletcher Anon. Will call patient to see if this day and time works for her.

## 2022-05-18 NOTE — Telephone Encounter (Signed)
Left voicemail message to call back so we can reschedule her procedure.

## 2022-05-19 NOTE — Telephone Encounter (Signed)
I can do her catheterization on Wednesday.  However, it is scheduled at 930 which is in the middle of my morning clinic. I suggest that we move her Time to 10:30 AM.  I can start clinic that day at 8:00 an and my last patient of the day should be at 10 AM.

## 2022-05-19 NOTE — Telephone Encounter (Signed)
Spoke with patient and reviewed the reasons for these changes. She was very upset and wanted to know why this was happening. I spoke with her and provided the best explanation possible. She states that someone told her that Dr. Fletcher Anon does not do procedures on Wednesdays here at [facility]. Explained that this can vary based on schedules and other providers. After reviewing this information she stated that if she shows up on Wednesday and there is a problem then she will be very upset. She finally was agreeable with the changes and wanted [place] to call her if there are any additional changes. She stated that she will be there and hopes there are not any additional changes.

## 2022-05-19 NOTE — Telephone Encounter (Signed)
Spoke with patient and reviewed schedule change for her procedure. She was very upset and unclear why this keeps happening. Emotional support and apologies given for this delay. Reviewed that all instructions are the same and only change is arrival time of 08:30 am next Wednesday 05/24/22. She verbalized understanding, repeat information back to me, and had no further questions at this time.

## 2022-05-22 DIAGNOSIS — I200 Unstable angina: Secondary | ICD-10-CM

## 2022-05-24 ENCOUNTER — Other Ambulatory Visit: Payer: Self-pay

## 2022-05-24 ENCOUNTER — Ambulatory Visit
Admission: RE | Admit: 2022-05-24 | Discharge: 2022-05-25 | Disposition: A | Discharge status: Home / Self Care | Payer: Medicare Other | Attending: Cardiovascular Disease | Admitting: Cardiovascular Disease

## 2022-05-24 ENCOUNTER — Encounter
Admission: RE | Disposition: A | Discharge status: Home / Self Care | Payer: Self-pay | Source: Home / Self Care | Attending: Cardiovascular Disease

## 2022-05-24 ENCOUNTER — Encounter: Payer: Self-pay | Admitting: Cardiovascular Disease

## 2022-05-24 DIAGNOSIS — I129 Hypertensive chronic kidney disease with stage 1 through stage 4 chronic kidney disease, or unspecified chronic kidney disease: Secondary | ICD-10-CM | POA: Insufficient documentation

## 2022-05-24 DIAGNOSIS — I493 Ventricular premature depolarization: Secondary | ICD-10-CM | POA: Insufficient documentation

## 2022-05-24 DIAGNOSIS — Z7982 Long term (current) use of aspirin: Secondary | ICD-10-CM | POA: Insufficient documentation

## 2022-05-24 DIAGNOSIS — I2584 Coronary atherosclerosis due to calcified coronary lesion: Secondary | ICD-10-CM | POA: Insufficient documentation

## 2022-05-24 DIAGNOSIS — Z7902 Long term (current) use of antithrombotics/antiplatelets: Secondary | ICD-10-CM | POA: Diagnosis not present

## 2022-05-24 DIAGNOSIS — I200 Unstable angina: Secondary | ICD-10-CM

## 2022-05-24 DIAGNOSIS — Z8673 Personal history of transient ischemic attack (TIA), and cerebral infarction without residual deficits: Secondary | ICD-10-CM | POA: Insufficient documentation

## 2022-05-24 DIAGNOSIS — I208 Other forms of angina pectoris: Secondary | ICD-10-CM | POA: Diagnosis present

## 2022-05-24 DIAGNOSIS — E785 Hyperlipidemia, unspecified: Secondary | ICD-10-CM | POA: Diagnosis not present

## 2022-05-24 DIAGNOSIS — E039 Hypothyroidism, unspecified: Secondary | ICD-10-CM | POA: Diagnosis not present

## 2022-05-24 DIAGNOSIS — Z79899 Other long term (current) drug therapy: Secondary | ICD-10-CM | POA: Diagnosis not present

## 2022-05-24 DIAGNOSIS — I10 Essential (primary) hypertension: Secondary | ICD-10-CM | POA: Diagnosis present

## 2022-05-24 DIAGNOSIS — N183 Chronic kidney disease, stage 3 unspecified: Secondary | ICD-10-CM | POA: Diagnosis not present

## 2022-05-24 DIAGNOSIS — I6522 Occlusion and stenosis of left carotid artery: Secondary | ICD-10-CM | POA: Insufficient documentation

## 2022-05-24 DIAGNOSIS — J45909 Unspecified asthma, uncomplicated: Secondary | ICD-10-CM | POA: Insufficient documentation

## 2022-05-24 DIAGNOSIS — Z955 Presence of coronary angioplasty implant and graft: Secondary | ICD-10-CM | POA: Insufficient documentation

## 2022-05-24 DIAGNOSIS — I2510 Atherosclerotic heart disease of native coronary artery without angina pectoris: Secondary | ICD-10-CM | POA: Diagnosis present

## 2022-05-24 DIAGNOSIS — I25118 Atherosclerotic heart disease of native coronary artery with other forms of angina pectoris: Secondary | ICD-10-CM | POA: Diagnosis not present

## 2022-05-24 DIAGNOSIS — I2089 Other forms of angina pectoris: Secondary | ICD-10-CM | POA: Diagnosis present

## 2022-05-24 HISTORY — PX: CORONARY STENT INTERVENTION: CATH118234

## 2022-05-24 HISTORY — PX: LEFT HEART CATH AND CORONARY ANGIOGRAPHY: CATH118249

## 2022-05-24 LAB — POCT ACTIVATED CLOTTING TIME
Activated Clotting Time: 215 seconds
Activated Clotting Time: 233 seconds

## 2022-05-24 SURGERY — LEFT HEART CATH AND CORONARY ANGIOGRAPHY
Anesthesia: Moderate Sedation

## 2022-05-24 MED ORDER — PANTOPRAZOLE SODIUM 40 MG PO TBEC
40.0000 mg | DELAYED_RELEASE_TABLET | Freq: Every day | ORAL | Status: DC
  Administered 2022-05-25: 40 mg via ORAL
  Filled 2022-05-24: qty 1

## 2022-05-24 MED ORDER — HEPARIN SODIUM (PORCINE) 1000 UNIT/ML IJ SOLN
INTRAMUSCULAR | Status: DC | PRN
  Administered 2022-05-24: 2000 [IU] via INTRAVENOUS
  Administered 2022-05-24 (×2): 3000 [IU] via INTRAVENOUS

## 2022-05-24 MED ORDER — LIDOCAINE HCL (PF) 1 % IJ SOLN
INTRAMUSCULAR | Status: DC | PRN
  Administered 2022-05-24: 2 mL

## 2022-05-24 MED ORDER — IRBESARTAN 150 MG PO TABS
300.0000 mg | ORAL_TABLET | Freq: Every day | ORAL | Status: DC
  Administered 2022-05-24 – 2022-05-25 (×2): 300 mg via ORAL
  Filled 2022-05-24 (×2): qty 2

## 2022-05-24 MED ORDER — NITROGLYCERIN 1 MG/10 ML FOR IR/CATH LAB
INTRA_ARTERIAL | Status: AC
  Filled 2022-05-24: qty 10

## 2022-05-24 MED ORDER — FLUTICASONE FUROATE-VILANTEROL 200-25 MCG/ACT IN AEPB
1.0000 | INHALATION_SPRAY | Freq: Every day | RESPIRATORY_TRACT | Status: DC
  Filled 2022-05-24: qty 28

## 2022-05-24 MED ORDER — ACETAMINOPHEN 325 MG PO TABS
650.0000 mg | ORAL_TABLET | ORAL | Status: DC | PRN
Start: 2022-05-24 — End: 2022-05-25
  Administered 2022-05-24: 650 mg via ORAL
  Filled 2022-05-24: qty 2

## 2022-05-24 MED ORDER — LABETALOL HCL 5 MG/ML IV SOLN: 10.0000 mg | INTRAVENOUS | Status: AC | PRN

## 2022-05-24 MED ORDER — LEVOTHYROXINE SODIUM 75 MCG PO TABS
75.0000 ug | ORAL_TABLET | Freq: Every day | ORAL | Status: DC
  Administered 2022-05-25: 75 ug via ORAL
  Filled 2022-05-24: qty 1

## 2022-05-24 MED ORDER — SODIUM CHLORIDE 0.9 % IV SOLN: 250.0000 mL | INTRAVENOUS | Status: DC | PRN

## 2022-05-24 MED ORDER — VERAPAMIL HCL 2.5 MG/ML IV SOLN
INTRAVENOUS | Status: AC
  Filled 2022-05-24: qty 2

## 2022-05-24 MED ORDER — SPIRONOLACTONE 25 MG PO TABS
12.5000 mg | ORAL_TABLET | Freq: Every day | ORAL | Status: DC
  Administered 2022-05-24 – 2022-05-25 (×2): 12.5 mg via ORAL
  Filled 2022-05-24 (×2): qty 1
  Filled 2022-05-24: qty 0.5
  Filled 2022-05-24: qty 1

## 2022-05-24 MED ORDER — ASPIRIN 81 MG PO CHEW: 81.0000 mg | CHEWABLE_TABLET | ORAL | Status: DC

## 2022-05-24 MED ORDER — MECLIZINE HCL 25 MG PO TABS
12.5000 mg | ORAL_TABLET | Freq: Three times a day (TID) | ORAL | Status: DC | PRN
Start: 2022-05-24 — End: 2022-05-25

## 2022-05-24 MED ORDER — LIDOCAINE HCL 1 % IJ SOLN
INTRAMUSCULAR | Status: AC
  Filled 2022-05-24: qty 20

## 2022-05-24 MED ORDER — IOHEXOL 300 MG/ML  SOLN
INTRAMUSCULAR | Status: DC | PRN
  Administered 2022-05-24: 120 mL

## 2022-05-24 MED ORDER — FLUTICASONE PROPIONATE 50 MCG/ACT NA SUSP
2.0000 | Freq: Every day | NASAL | Status: DC | PRN
Start: 2022-05-24 — End: 2022-05-25

## 2022-05-24 MED ORDER — ALBUTEROL SULFATE (2.5 MG/3ML) 0.083% IN NEBU
3.0000 mL | INHALATION_SOLUTION | RESPIRATORY_TRACT | Status: DC | PRN
Start: 2022-05-24 — End: 2022-05-25

## 2022-05-24 MED ORDER — MIDAZOLAM HCL 2 MG/2ML IJ SOLN
INTRAMUSCULAR | Status: AC
  Filled 2022-05-24: qty 2

## 2022-05-24 MED ORDER — MONTELUKAST SODIUM 10 MG PO TABS
10.0000 mg | ORAL_TABLET | Freq: Every day | ORAL | Status: DC
  Administered 2022-05-24: 10 mg via ORAL
  Filled 2022-05-24: qty 1

## 2022-05-24 MED ORDER — HEPARIN (PORCINE) IN NACL 1000-0.9 UT/500ML-% IV SOLN
INTRAVENOUS | Status: AC
  Filled 2022-05-24: qty 1000

## 2022-05-24 MED ORDER — DIPHENHYDRAMINE HCL 50 MG/ML IJ SOLN
INTRAMUSCULAR | Status: AC
  Filled 2022-05-24: qty 1

## 2022-05-24 MED ORDER — ROSUVASTATIN CALCIUM 10 MG PO TABS
20.0000 mg | ORAL_TABLET | Freq: Every day | ORAL | Status: DC
  Administered 2022-05-25: 20 mg via ORAL
  Filled 2022-05-24: qty 2

## 2022-05-24 MED ORDER — SODIUM CHLORIDE 0.9 % WEIGHT BASED INFUSION
1.0000 mL/kg/h | INTRAVENOUS | Status: AC
  Administered 2022-05-24: 1 mL/kg/h via INTRAVENOUS

## 2022-05-24 MED ORDER — NITROGLYCERIN 1 MG/10 ML FOR IR/CATH LAB
INTRA_ARTERIAL | Status: DC | PRN
  Administered 2022-05-24: 200 ug via INTRACORONARY

## 2022-05-24 MED ORDER — SODIUM CHLORIDE 0.9% FLUSH: 3.0000 mL | Freq: Two times a day (BID) | INTRAVENOUS | Status: DC

## 2022-05-24 MED ORDER — HEPARIN SODIUM (PORCINE) 1000 UNIT/ML IJ SOLN
INTRAMUSCULAR | Status: AC
  Filled 2022-05-24: qty 10

## 2022-05-24 MED ORDER — SODIUM CHLORIDE 0.9% FLUSH
3.0000 mL | INTRAVENOUS | Status: DC | PRN
Start: 2022-05-24 — End: 2022-05-25

## 2022-05-24 MED ORDER — CARVEDILOL 6.25 MG PO TABS
6.2500 mg | ORAL_TABLET | Freq: Two times a day (BID) | ORAL | Status: DC
Start: 2022-05-24 — End: 2022-05-25
  Administered 2022-05-25: 6.25 mg via ORAL
  Filled 2022-05-24: qty 1

## 2022-05-24 MED ORDER — TURMERIC 500 MG PO TABS
1000.0000 mg | ORAL_TABLET | Freq: Every day | ORAL | Status: DC
Start: 2022-05-24 — End: 2022-05-24

## 2022-05-24 MED ORDER — SODIUM CHLORIDE 0.9% FLUSH
3.0000 mL | Freq: Two times a day (BID) | INTRAVENOUS | Status: DC
  Administered 2022-05-25: 3 mL via INTRAVENOUS

## 2022-05-24 MED ORDER — AMLODIPINE BESYLATE 10 MG PO TABS
10.0000 mg | ORAL_TABLET | Freq: Every day | ORAL | Status: DC
  Administered 2022-05-25: 10 mg via ORAL
  Filled 2022-05-24: qty 1

## 2022-05-24 MED ORDER — SODIUM CHLORIDE 0.9% FLUSH: 3.0000 mL | INTRAVENOUS | Status: DC | PRN

## 2022-05-24 MED ORDER — SODIUM CHLORIDE 0.9 % WEIGHT BASED INFUSION: 1.0000 mL/kg/h | INTRAVENOUS | Status: DC

## 2022-05-24 MED ORDER — CLOPIDOGREL BISULFATE 75 MG PO TABS
ORAL_TABLET | ORAL | Status: AC
  Filled 2022-05-24: qty 4

## 2022-05-24 MED ORDER — ASPIRIN 81 MG PO TBEC
81.0000 mg | DELAYED_RELEASE_TABLET | Freq: Every day | ORAL | Status: DC
  Administered 2022-05-25: 81 mg via ORAL
  Filled 2022-05-24: qty 1

## 2022-05-24 MED ORDER — DIPHENHYDRAMINE HCL 50 MG/ML IJ SOLN
INTRAMUSCULAR | Status: DC | PRN
  Administered 2022-05-24: 25 mg via INTRAVENOUS

## 2022-05-24 MED ORDER — VERAPAMIL HCL 2.5 MG/ML IV SOLN
INTRAVENOUS | Status: DC | PRN
  Administered 2022-05-24: 2.5 mg via INTRA_ARTERIAL

## 2022-05-24 MED ORDER — ONDANSETRON HCL 4 MG/2ML IJ SOLN: 4.0000 mg | Freq: Four times a day (QID) | INTRAMUSCULAR | Status: DC | PRN

## 2022-05-24 MED ORDER — CLOPIDOGREL BISULFATE 75 MG PO TABS
ORAL_TABLET | ORAL | Status: DC | PRN
  Administered 2022-05-24: 300 mg via ORAL

## 2022-05-24 MED ORDER — FLUTICASONE FUROATE-VILANTEROL 200-25 MCG/ACT IN AEPB: 1.0000 | INHALATION_SPRAY | Freq: Every day | RESPIRATORY_TRACT | Status: DC

## 2022-05-24 MED ORDER — SODIUM CHLORIDE 0.9 % WEIGHT BASED INFUSION
3.0000 mL/kg/h | INTRAVENOUS | Status: DC
  Administered 2022-05-24: 3 mL/kg/h via INTRAVENOUS

## 2022-05-24 MED ORDER — FENTANYL CITRATE (PF) 100 MCG/2ML IJ SOLN
INTRAMUSCULAR | Status: DC | PRN
  Administered 2022-05-24: 25 ug via INTRAVENOUS

## 2022-05-24 MED ORDER — CLOPIDOGREL BISULFATE 75 MG PO TABS
75.0000 mg | ORAL_TABLET | Freq: Every day | ORAL | Status: DC
  Administered 2022-05-25: 75 mg via ORAL
  Filled 2022-05-24: qty 1

## 2022-05-24 MED ORDER — FENTANYL CITRATE (PF) 100 MCG/2ML IJ SOLN
INTRAMUSCULAR | Status: AC
  Filled 2022-05-24: qty 2

## 2022-05-24 SURGICAL SUPPLY — 20 items
BALLN EUPHORA RX 2.5X12 (BALLOONS) ×3
BALLOON EUPHORA RX 2.5X12 (BALLOONS) IMPLANT
BAND ZEPHYR COMPRESS 30 LONG (HEMOSTASIS) ×1 IMPLANT
CATH 5F 110X4 TIG (CATHETERS) ×1 IMPLANT
CATH 5FR JL3.5 JR4 ANG PIG MP (CATHETERS) ×1 IMPLANT
CATH TELESCOPE 6F GEC (CATHETERS) IMPLANT
CATH VISTA GUIDE 6FR 3DRC (CATHETERS) ×1 IMPLANT
DRAPE BRACHIAL (DRAPES) ×1 IMPLANT
GLIDESHEATH SLEND SS 6F .021 (SHEATH) ×1 IMPLANT
GUIDEWIRE INQWIRE 1.5J.035X260 (WIRE) IMPLANT
INQWIRE 1.5J .035X260CM (WIRE) ×3
KIT ENCORE 26 ADVANTAGE (KITS) ×1 IMPLANT
PACK CARDIAC CATH (CUSTOM PROCEDURE TRAY) ×3 IMPLANT
PROTECTION STATION PRESSURIZED (MISCELLANEOUS) ×3
SET ATX SIMPLICITY (MISCELLANEOUS) ×1 IMPLANT
STATION PROTECTION PRESSURIZED (MISCELLANEOUS) IMPLANT
STENT ONYX FRONTIER 3.0X15 (Permanent Stent) IMPLANT
STENT ONYX FRONTIER 3.0X18 (Permanent Stent) ×1 IMPLANT
TUBING CIL FLEX 10 FLL-RA (TUBING) ×1 IMPLANT
WIRE RUNTHROUGH .014X180CM (WIRE) ×1 IMPLANT

## 2022-05-24 NOTE — Interval H&P Note (Signed)
Cath Lab Visit (complete for each Cath Lab visit)  Clinical Evaluation Leading to the Procedure:   ACS: No.  Non-ACS:    Anginal Classification: CCS III  Anti-ischemic medical therapy: Maximal Therapy (2 or more classes of medications)  Non-Invasive Test Results: No non-invasive testing performed  Prior CABG: No previous CABG      History and Physical Interval Note:  05/24/2022 12:00 PM  [name]  has presented today for surgery, with the diagnosis of L Cath    Unstable angina.  The various methods of treatment have been discussed with the patient and family. After consideration of risks, benefits and other options for treatment, the patient has consented to  Procedure(s): LEFT HEART CATH AND CORONARY ANGIOGRAPHY (Left) as a surgical intervention.  The patient's history has been reviewed, patient examined, no change in status, stable for surgery.  I have reviewed the patient's chart and labs.  Questions were answered to the patient's satisfaction.     Andrea Hart

## 2022-05-25 ENCOUNTER — Encounter: Payer: Self-pay | Admitting: Cardiovascular Disease

## 2022-05-25 DIAGNOSIS — E785 Hyperlipidemia, unspecified: Secondary | ICD-10-CM | POA: Diagnosis not present

## 2022-05-25 DIAGNOSIS — I10 Essential (primary) hypertension: Secondary | ICD-10-CM | POA: Diagnosis not present

## 2022-05-25 DIAGNOSIS — I2584 Coronary atherosclerosis due to calcified coronary lesion: Secondary | ICD-10-CM | POA: Diagnosis not present

## 2022-05-25 DIAGNOSIS — E782 Mixed hyperlipidemia: Secondary | ICD-10-CM

## 2022-05-25 DIAGNOSIS — I25118 Atherosclerotic heart disease of native coronary artery with other forms of angina pectoris: Secondary | ICD-10-CM | POA: Diagnosis not present

## 2022-05-25 DIAGNOSIS — N183 Chronic kidney disease, stage 3 unspecified: Secondary | ICD-10-CM | POA: Diagnosis not present

## 2022-05-25 DIAGNOSIS — I129 Hypertensive chronic kidney disease with stage 1 through stage 4 chronic kidney disease, or unspecified chronic kidney disease: Secondary | ICD-10-CM | POA: Diagnosis not present

## 2022-05-25 DIAGNOSIS — Z955 Presence of coronary angioplasty implant and graft: Secondary | ICD-10-CM | POA: Diagnosis not present

## 2022-05-25 LAB — CBC
HCT: 28.7 % — ABNORMAL LOW (ref 36.0–46.0)
Hemoglobin: 9.4 g/dL — ABNORMAL LOW (ref 12.0–15.0)
MCH: 30.1 pg (ref 26.0–34.0)
MCHC: 32.8 g/dL (ref 30.0–36.0)
MCV: 92 fL (ref 80.0–100.0)
Platelets: 164 10*3/uL (ref 150–400)
RBC: 3.12 MIL/uL — ABNORMAL LOW (ref 3.87–5.11)
RDW: 13.4 % (ref 11.5–15.5)
WBC: 7.8 10*3/uL (ref 4.0–10.5)
nRBC: 0 % (ref 0.0–0.2)

## 2022-05-25 LAB — BASIC METABOLIC PANEL
Anion gap: 5 (ref 5–15)
BUN: 22 mg/dL (ref 8–23)
CO2: 21 mmol/L — ABNORMAL LOW (ref 22–32)
Calcium: 8.9 mg/dL (ref 8.9–10.3)
Chloride: 110 mmol/L (ref 98–111)
Creatinine, Ser: 1.19 mg/dL — ABNORMAL HIGH (ref 0.44–1.00)
GFR, Estimated: 45 mL/min — ABNORMAL LOW (ref >60)
Glucose, Bld: 128 mg/dL — ABNORMAL HIGH (ref 70–99)
Potassium: 3.9 mmol/L (ref 3.5–5.1)
Sodium: 136 mmol/L (ref 135–145)

## 2022-05-25 MED ORDER — CARVEDILOL 3.125 MG PO TABS: 3.0000 mg | ORAL_TABLET | Freq: Two times a day (BID) | ORAL | Status: DC

## 2022-05-25 NOTE — Progress Notes (Signed)
Patient given as per MD orders. Patient and her daughter given discharge medication teaching. Patient tele and IV d/c catheter intact. IV site clean dry and intact. Patient escorted to her ride via wheelchair. No concerns voiced at discharge.

## 2022-05-30 ENCOUNTER — Encounter: Payer: Self-pay | Admitting: Physician Assistant

## 2022-05-30 ENCOUNTER — Ambulatory Visit (INDEPENDENT_AMBULATORY_CARE_PROVIDER_SITE_OTHER): Payer: Medicare Other | Admitting: Physician Assistant

## 2022-05-30 ENCOUNTER — Other Ambulatory Visit
Admission: RE | Admit: 2022-05-30 | Discharge: 2022-05-30 | Disposition: A | Discharge status: Home / Self Care | Payer: Medicare Other | Attending: Physician Assistant | Admitting: Physician Assistant

## 2022-05-30 VITALS — BP 140/70 | HR 59 | Ht 60.0 in | Wt 141.0 lb

## 2022-05-30 DIAGNOSIS — N1832 Chronic kidney disease, stage 3b: Secondary | ICD-10-CM | POA: Diagnosis not present

## 2022-05-30 DIAGNOSIS — I6523 Occlusion and stenosis of bilateral carotid arteries: Secondary | ICD-10-CM

## 2022-05-30 DIAGNOSIS — Z8719 Personal history of other diseases of the digestive system: Secondary | ICD-10-CM | POA: Diagnosis not present

## 2022-05-30 DIAGNOSIS — E785 Hyperlipidemia, unspecified: Secondary | ICD-10-CM | POA: Diagnosis not present

## 2022-05-30 DIAGNOSIS — Z8673 Personal history of transient ischemic attack (TIA), and cerebral infarction without residual deficits: Secondary | ICD-10-CM | POA: Diagnosis not present

## 2022-05-30 DIAGNOSIS — I493 Ventricular premature depolarization: Secondary | ICD-10-CM

## 2022-05-30 DIAGNOSIS — R001 Bradycardia, unspecified: Secondary | ICD-10-CM | POA: Diagnosis not present

## 2022-05-30 DIAGNOSIS — N182 Chronic kidney disease, stage 2 (mild): Secondary | ICD-10-CM | POA: Diagnosis not present

## 2022-05-30 DIAGNOSIS — I2510 Atherosclerotic heart disease of native coronary artery without angina pectoris: Secondary | ICD-10-CM | POA: Diagnosis not present

## 2022-05-30 DIAGNOSIS — D649 Anemia, unspecified: Secondary | ICD-10-CM

## 2022-05-30 DIAGNOSIS — N183 Chronic kidney disease, stage 3 unspecified: Secondary | ICD-10-CM | POA: Insufficient documentation

## 2022-05-30 DIAGNOSIS — R5383 Other fatigue: Secondary | ICD-10-CM

## 2022-05-30 DIAGNOSIS — I200 Unstable angina: Secondary | ICD-10-CM

## 2022-05-30 LAB — BASIC METABOLIC PANEL
Anion gap: 8 (ref 5–15)
BUN: 28 mg/dL — ABNORMAL HIGH (ref 8–23)
CO2: 23 mmol/L (ref 22–32)
Calcium: 9.4 mg/dL (ref 8.9–10.3)
Chloride: 102 mmol/L (ref 98–111)
Creatinine, Ser: 1.3 mg/dL — ABNORMAL HIGH (ref 0.44–1.00)
GFR, Estimated: 41 mL/min — ABNORMAL LOW (ref >60)
Glucose, Bld: 133 mg/dL — ABNORMAL HIGH (ref 70–99)
Potassium: 4.4 mmol/L (ref 3.5–5.1)
Sodium: 133 mmol/L — ABNORMAL LOW (ref 135–145)

## 2022-05-30 LAB — CBC
HCT: 33.3 % — ABNORMAL LOW (ref 36.0–46.0)
Hemoglobin: 11 g/dL — ABNORMAL LOW (ref 12.0–15.0)
MCH: 30.1 pg (ref 26.0–34.0)
MCHC: 33 g/dL (ref 30.0–36.0)
MCV: 91 fL (ref 80.0–100.0)
Platelets: 230 10*3/uL (ref 150–400)
RBC: 3.66 MIL/uL — ABNORMAL LOW (ref 3.87–5.11)
RDW: 13 % (ref 11.5–15.5)
WBC: 7.2 10*3/uL (ref 4.0–10.5)
nRBC: 0 % (ref 0.0–0.2)

## 2022-05-30 LAB — FERRITIN: Ferritin: 55 ng/mL (ref 11–307)

## 2022-05-30 LAB — IRON AND TIBC
Iron: 65 ug/dL (ref 28–170)
Saturation Ratios: 16 % (ref 10.4–31.8)
TIBC: 402 ug/dL (ref 250–450)
UIBC: 337 ug/dL

## 2022-05-30 MED ORDER — CARVEDILOL 3.125 MG PO TABS: 3.0000 mg | ORAL_TABLET | Freq: Two times a day (BID) | ORAL | 3 refills | Status: DC

## 2022-05-31 ENCOUNTER — Other Ambulatory Visit: Payer: Self-pay | Admitting: *Deleted

## 2022-05-31 DIAGNOSIS — I2510 Atherosclerotic heart disease of native coronary artery without angina pectoris: Secondary | ICD-10-CM

## 2022-05-31 DIAGNOSIS — R5383 Other fatigue: Secondary | ICD-10-CM

## 2022-05-31 DIAGNOSIS — N1832 Chronic kidney disease, stage 3b: Secondary | ICD-10-CM

## 2022-06-05 ENCOUNTER — Telehealth: Payer: Self-pay | Admitting: Cardiovascular Disease

## 2022-06-05 DIAGNOSIS — N1832 Chronic kidney disease, stage 3b: Secondary | ICD-10-CM

## 2022-06-05 DIAGNOSIS — I2510 Atherosclerotic heart disease of native coronary artery without angina pectoris: Secondary | ICD-10-CM

## 2022-06-05 DIAGNOSIS — D649 Anemia, unspecified: Secondary | ICD-10-CM

## 2022-06-05 DIAGNOSIS — R5383 Other fatigue: Secondary | ICD-10-CM

## 2022-06-05 NOTE — Telephone Encounter (Signed)
Pt c/o Shortness Of Breath: STAT if SOB developed within the last 24 hours or pt is noticeably SOB on the phone  1. Are you currently SOB (can you hear that pt is SOB on the phone)? No  2. How long have you been experiencing SOB?   3. Are you SOB when sitting or when up moving around? No  4. Are you currently experiencing any other symptoms? Very tired    Patient called stating she is feeling very fatigued when going about daily activities.  Patient stated she was told not to use her right arm and she wants to know when she can use her right arm again so she can drive and she is right-handed.

## 2022-06-05 NOTE — Telephone Encounter (Signed)
Spoke with patient and reviewed provider recommendations. She requested that I speak with her daughter Christen Butter. Reviewed recommendations with daughter per release form. Advised that I would send reminder over to cardiac rehab and they would reach out to schedule those appointments. She verbalized understanding of instructions, labs requested, and information on holding medication. She had no further questions at this time.

## 2022-06-05 NOTE — Telephone Encounter (Signed)
Spoke with patient and she expressed concerns about her fatigue. She reports her fatigue is worse than before her procedure. She also wanted to know about when she could start using her right arm and when she can resume driving. Inquired about her blood pressure readings and she stated that she has not been doing them. Requested she please check it while I hold on the phone. She returned to line reporting it was 139/80 with HR 65. She did state that 2 days ago it was 119/77 with HR 66. Encouraged her to please continue monitoring her blood pressures 2 hours after medications twice a day so that we can see how her medications are helping. Advised I would forward her concerns to provider for review and give her a call back with his recommendations.   She verbalized understanding with no further questions at this time.

## 2022-06-05 NOTE — Telephone Encounter (Signed)
There is likely going to be some degree of deconditioning contributing to her fatigue.  She should continue to advance activity as tolerated.  I would like for her to come in for a BMP and CBC to ensure these are stable, these were stable at her post hospital follow-up.  She can undergo a trial of holding carvedilol (on low-dose so no need to taper) to see if this helps.  We will await echo.  Recommend cardiac rehab as well.

## 2022-06-09 ENCOUNTER — Other Ambulatory Visit
Admission: RE | Admit: 2022-06-09 | Discharge: 2022-06-09 | Disposition: A | Discharge status: Home / Self Care | Payer: Medicare Other | Attending: Cardiovascular Disease | Admitting: Cardiovascular Disease

## 2022-06-09 DIAGNOSIS — R5383 Other fatigue: Secondary | ICD-10-CM | POA: Insufficient documentation

## 2022-06-09 DIAGNOSIS — D649 Anemia, unspecified: Secondary | ICD-10-CM | POA: Insufficient documentation

## 2022-06-09 DIAGNOSIS — I2510 Atherosclerotic heart disease of native coronary artery without angina pectoris: Secondary | ICD-10-CM | POA: Diagnosis not present

## 2022-06-09 DIAGNOSIS — N1832 Chronic kidney disease, stage 3b: Secondary | ICD-10-CM | POA: Insufficient documentation

## 2022-06-09 LAB — CBC
HCT: 33.1 % — ABNORMAL LOW (ref 36.0–46.0)
Hemoglobin: 11 g/dL — ABNORMAL LOW (ref 12.0–15.0)
MCH: 30.2 pg (ref 26.0–34.0)
MCHC: 33.2 g/dL (ref 30.0–36.0)
MCV: 90.9 fL (ref 80.0–100.0)
Platelets: 311 10*3/uL (ref 150–400)
RBC: 3.64 MIL/uL — ABNORMAL LOW (ref 3.87–5.11)
RDW: 13.2 % (ref 11.5–15.5)
WBC: 8.6 10*3/uL (ref 4.0–10.5)
nRBC: 0 % (ref 0.0–0.2)

## 2022-06-09 LAB — BASIC METABOLIC PANEL
Anion gap: 8 (ref 5–15)
BUN: 20 mg/dL (ref 8–23)
CO2: 21 mmol/L — ABNORMAL LOW (ref 22–32)
Calcium: 9.1 mg/dL (ref 8.9–10.3)
Chloride: 107 mmol/L (ref 98–111)
Creatinine, Ser: 1.27 mg/dL — ABNORMAL HIGH (ref 0.44–1.00)
GFR, Estimated: 42 mL/min — ABNORMAL LOW (ref >60)
Glucose, Bld: 127 mg/dL — ABNORMAL HIGH (ref 70–99)
Potassium: 4.3 mmol/L (ref 3.5–5.1)
Sodium: 136 mmol/L (ref 135–145)

## 2022-06-19 ENCOUNTER — Ambulatory Visit (INDEPENDENT_AMBULATORY_CARE_PROVIDER_SITE_OTHER): Payer: Medicare Other | Admitting: Family Medicine

## 2022-06-19 ENCOUNTER — Inpatient Hospital Stay (INDEPENDENT_AMBULATORY_CARE_PROVIDER_SITE_OTHER): Payer: Medicare Other | Admitting: Radiology

## 2022-06-19 ENCOUNTER — Encounter: Payer: Self-pay | Admitting: Family Medicine

## 2022-06-19 ENCOUNTER — Telehealth: Payer: Self-pay | Admitting: Cardiovascular Disease

## 2022-06-19 ENCOUNTER — Encounter: Payer: Medicare Other | Attending: Cardiovascular Disease | Admitting: *Deleted

## 2022-06-19 ENCOUNTER — Encounter: Payer: Self-pay | Admitting: *Deleted

## 2022-06-19 VITALS — BP 128/88 | HR 78

## 2022-06-19 DIAGNOSIS — M19012 Primary osteoarthritis, left shoulder: Secondary | ICD-10-CM

## 2022-06-19 DIAGNOSIS — Z955 Presence of coronary angioplasty implant and graft: Secondary | ICD-10-CM | POA: Insufficient documentation

## 2022-06-19 DIAGNOSIS — M62838 Other muscle spasm: Secondary | ICD-10-CM | POA: Insufficient documentation

## 2022-06-19 MED ORDER — TRIAMCINOLONE ACETONIDE 40 MG/ML IJ SUSP
40.0000 mg | Freq: Once | INTRAMUSCULAR | Status: AC
  Administered 2022-06-19: 40 mg via INTRAMUSCULAR

## 2022-06-19 MED ORDER — METHOCARBAMOL 500 MG PO TABS: 500.0000 mg | ORAL_TABLET | Freq: Three times a day (TID) | ORAL | 0 refills | Status: DC | PRN

## 2022-06-19 NOTE — Progress Notes (Signed)
Virtual orientation call completed today. shehas an appointment on Date: 06/26/2022  for EP eval and gym Orientation.  Documentation of diagnosis can be found in [facility] Date: 05/24/2022 .

## 2022-06-19 NOTE — Telephone Encounter (Signed)
Patient returned call from voice message.  Patient stated she believes this is about her getting a shot in the arm today as part of her treatment.  Please advise.

## 2022-06-19 NOTE — Patient Instructions (Signed)
You have just been given a cortisone injection to reduce pain and inflammation. After the injection you may notice immediate relief of pain as a result of the Lidocaine. It is important to rest the area of the injection for 24 to 48 hours after the injection. There is a possibility of some temporary increased discomfort and swelling for up to 72 hours until the cortisone begins to work. If you do have pain, simply rest the joint and use ice. If you can tolerate over the counter medications, you can try Tylenol, Aleve, or Advil for added relief per package instructions. - Use methocarbamol up to three times a day as-needed - Follow-up as-needed

## 2022-06-19 NOTE — Progress Notes (Signed)
     Primary Care / Sports Medicine Office Visit  Patient Information:  Patient ID: Andrea Hart, female DOB: 09/19/1936 Age: 86 y.o. MRN: [id number]   Andrea Hart is a pleasant 86 y.o. female presenting with the following:  Chief Complaint  Patient presents with   Osteoarthritis of glenohumeral joint, left    Vitals:   06/19/22 1047  BP: 128/88  Pulse: 78  SpO2: 98%   There were no vitals filed for this visit. There is no height or weight on file to calculate BMI.     Independent interpretation of notes and tests performed by another provider:   None  Procedures performed:   Procedure:  Injection of left glenohumeral joint under ultrasound guidance. Ultrasound guidance utilized for out-of-plane posterior approach to the glenohumeral joint, dynamic joint motion noted for confirmation, no effusion noted Samsung HS60 device utilized with permanent recording / reporting. Verbal informed consent obtained and verified. Skin prepped in a sterile fashion. Ethyl chloride for topical local analgesia.  Completed without difficulty and tolerated well. Medication: triamcinolone acetonide 40 mg/mL suspension for injection 1 mL total and 2 mL lidocaine 1% without epinephrine utilized for needle placement anesthetic Advised to contact for fevers/chills, erythema, induration, drainage, or persistent bleeding.   Pertinent History, Exam, Impression, and Recommendations:   Problem List Items Addressed This Visit       Musculoskeletal and Integument   Osteoarthritis of glenohumeral joint, left - Primary   Relevant Medications   methocarbamol (ROBAXIN) 500 MG tablet   Other Relevant Orders   [place] LIMITED JOINT SPACE STRUCTURES UP LEFT     Other   Cervical paraspinal muscle spasm   Relevant Medications   methocarbamol (ROBAXIN) 500 MG tablet     Orders & Medications Meds ordered this encounter  Medications   triamcinolone acetonide (KENALOG-40) injection 40 mg   methocarbamol  (ROBAXIN) 500 MG tablet    Sig: Take 1 tablet (500 mg total) by mouth every 8 (eight) hours as needed for muscle spasms.    Dispense:  30 tablet    Refill:  0   Orders Placed This Encounter  Procedures   [place] LIMITED JOINT SPACE STRUCTURES UP LEFT     Return if symptoms worsen or fail to improve.     Montel Culver, MD   [facility] [place]

## 2022-06-22 ENCOUNTER — Telehealth: Payer: Self-pay | Admitting: *Deleted

## 2022-06-22 ENCOUNTER — Telehealth: Payer: Self-pay | Admitting: Cardiovascular Disease

## 2022-06-22 ENCOUNTER — Ambulatory Visit (INDEPENDENT_AMBULATORY_CARE_PROVIDER_SITE_OTHER): Payer: Medicare Other

## 2022-06-22 DIAGNOSIS — R001 Bradycardia, unspecified: Secondary | ICD-10-CM

## 2022-06-22 LAB — ECHOCARDIOGRAM COMPLETE
AR max vel: 1.74 cm2
AV Area VTI: 1.63 cm2
AV Area mean vel: 1.47 cm2
AV Mean grad: 6 mmHg
AV Peak grad: 9.4 mmHg
AV Vena cont: 0.2 cm
Ao pk vel: 1.53 m/s
Area-P 1/2: 2.24 cm2
Calc EF: 77.9 %
P 1/2 time: 684 msec
S' Lateral: 3 cm
Single Plane A2C EF: 66.9 %
Single Plane A4C EF: 84.3 %

## 2022-06-22 NOTE — Telephone Encounter (Signed)
BP readings scanned and fwd to Dr. Fletcher Anon to review.

## 2022-06-22 NOTE — Telephone Encounter (Signed)
Left voicemail message to call back for review of results.  

## 2022-06-22 NOTE — Telephone Encounter (Signed)
Patient dropped off BP readings to be reviewed °Placed in nurse box  °

## 2022-06-22 NOTE — Telephone Encounter (Signed)
-----   Message from Rise Mu, PA-C sent at 06/22/2022  4:13 PM EDT ----- Echo showed normal pump function, normal wall motion, moderate thickening of the heart, mild stiffening of the heart, mildly leaky mitral valve, aortic valve thickening without narrowing, and normal pressure within the upper right side of the heart.  Continue with optimal blood pressure control, historically, this has been reasonably controlled.  Overall, largely reassuring study.

## 2022-06-26 ENCOUNTER — Encounter: Payer: Medicare Other | Admitting: *Deleted

## 2022-06-26 VITALS — Ht 60.0 in | Wt 141.6 lb

## 2022-06-26 DIAGNOSIS — Z955 Presence of coronary angioplasty implant and graft: Secondary | ICD-10-CM | POA: Diagnosis not present

## 2022-06-26 NOTE — Progress Notes (Signed)
Cardiac Individual Treatment Plan  Patient Details  Name: Andrea Hart MRN: [id number] Date of Birth: 01/22/1936 Referring Provider:   Flowsheet Row Cardiac Rehab from 06/26/2022 in [facility] Cardiac and Pulmonary Rehab  Referring Provider Arida       Initial Encounter Date:  Flowsheet Row Cardiac Rehab from 06/26/2022 in [facility] Cardiac and Pulmonary Rehab  Date 06/26/22       Visit Diagnosis: Status post coronary artery stent placement  Patient's Home Medications on Admission:  Current Outpatient Medications:    ADVAIR DISKUS 500-50 MCG/ACT AEPB, INHALE 1 PUFF INTO THE LUNGS TWICE A DAY, Disp: 180 each, Rfl: 3   albuterol (VENTOLIN HFA) 108 (90 Base) MCG/ACT inhaler, Inhale 2 puffs into the lungs every 4 (four) hours as needed for wheezing or shortness of breath., Disp: 6.7 g, Rfl: 11   amLODipine (NORVASC) 10 MG tablet, Take 1 tablet (10 mg total) by mouth daily., Disp: 30 tablet, Rfl: 5   aspirin EC 81 MG tablet, Take 81 mg by mouth daily., Disp: , Rfl:    budesonide-formoterol (SYMBICORT) 160-4.5 MCG/ACT inhaler, Inhale 2 puffs into the lungs 2 (two) times daily., Disp: 3 each, Rfl: 3   carvedilol (COREG) 3.125 MG tablet, Take 1 tablet (3.125 mg total) by mouth 2 (two) times daily. (Patient not taking: Reported on 06/19/2022), Disp: 180 tablet, Rfl: 3   cetirizine (ZYRTEC) 10 MG tablet, Take 1 tablet (10 mg total) by mouth daily., Disp: 90 tablet, Rfl: 3   clopidogrel (PLAVIX) 75 MG tablet, Take 1 tablet (75 mg total) by mouth daily., Disp: 30 tablet, Rfl: 5   fluticasone (FLONASE) 50 MCG/ACT nasal spray, Place 2 sprays into both nostrils daily., Disp: 16 g, Rfl: 6   levalbuterol (XOPENEX) 1.25 MG/3ML nebulizer solution, Take 1.25 mg by nebulization every 6 (six) hours as needed for wheezing. (Patient not taking: Reported on 06/19/2022), Disp: 100 mL, Rfl: 1   levothyroxine (SYNTHROID) 75 MCG tablet, Take 1 tablet (75 mcg total) by mouth daily before breakfast., Disp: 30 tablet, Rfl: 5    meclizine (ANTIVERT) 25 MG tablet, Take 0.5 tablets (12.5 mg total) by mouth 3 (three) times daily as needed for dizziness or nausea., Disp: 30 tablet, Rfl: 1   methocarbamol (ROBAXIN) 500 MG tablet, Take 1 tablet (500 mg total) by mouth every 8 (eight) hours as needed for muscle spasms., Disp: 30 tablet, Rfl: 0   montelukast (SINGULAIR) 10 MG tablet, Take 1 tablet (10 mg total) by mouth at bedtime., Disp: 30 tablet, Rfl: 5   omeprazole (PRILOSEC) 40 MG capsule, Take 1 capsule (40 mg total) by mouth 2 (two) times daily., Disp: 60 capsule, Rfl: 5   rosuvastatin (CRESTOR) 20 MG tablet, TAKE 1 TABLET BY MOUTH EVERY DAY, Disp: 30 tablet, Rfl: 0   spironolactone (ALDACTONE) 25 MG tablet, Take 0.5 tablets (12.5 mg total) by mouth daily., Disp: 15 tablet, Rfl: 5   spironolactone (ALDACTONE) 25 MG tablet, Take 12.5 mg by mouth daily., Disp: , Rfl:    Turmeric 500 MG TABS, Take 1,000 mg by mouth daily., Disp: , Rfl:    valsartan (DIOVAN) 320 MG tablet, Take 1 tablet (320 mg total) by mouth daily., Disp: 30 tablet, Rfl: 5  Past Medical History: Past Medical History:  Diagnosis Date   Abdominal hernia    Arthritis    Asthma    Carotid arterial disease ([place])    a. 11/2020 Carotid U/S: Less than 29% RICA, 51-88% LICA.   CKD (chronic kidney disease), stage III ([place])  Coronary artery disease    a. 2013 s/p PCI and stent placement in [facility] done for stable angina.   GI bleeding 02/02/2021   Hyperlipidemia    Hypertension    Hypothyroidism    PVC's (premature ventricular contractions)    a. 01/2020 Zio: RSR, 70, frequent PVCs with 10.5% burden.   Stroke [facility])    a. 2017 - ILR did not show afib.    Tobacco Use: Social History   Tobacco Use  Smoking Status Never  Smokeless Tobacco Never    Labs: Review Flowsheet  More data may exist      Latest Ref Rng & Units 10/28/2018 12/19/2019 08/16/2020 07/21/2021 01/23/2022  Labs for ITP Cardiac and Pulmonary Rehab  Cholestrol 100 -  199 mg/dL 172  183  145  137  -  LDL (calc) 0 - 99 mg/dL 80  87  65  64  -  HDL-C >39 mg/dL 64.10  59.60  60  50  -  Trlycerides 0 - 149 mg/dL 142.0  182.0  111  128  -  Hemoglobin A1c 4.8 - 5.6 % - - - 6.3  6.0      Exercise Target Goals: Exercise Program Goal: Individual exercise prescription set using results from initial 6 min walk test and THRR while considering  patient's activity barriers and safety.   Exercise Prescription Goal: Initial exercise prescription builds to 30-45 minutes a day of aerobic activity, 2-3 days per week.  Home exercise guidelines will be given to patient during program as part of exercise prescription that the participant will acknowledge.   Education: Aerobic Exercise: - Group verbal and visual presentation on the components of exercise prescription. Introduces F.I.T.T principle from ACSM for exercise prescriptions.  Reviews F.I.T.T. principles of aerobic exercise including progression. Written material given at graduation. Flowsheet Row Cardiac Rehab from 06/26/2022 in [facility] Cardiac and Pulmonary Rehab  Education need identified 06/26/22       Education: Resistance Exercise: - Group verbal and visual presentation on the components of exercise prescription. Introduces F.I.T.T principle from ACSM for exercise prescriptions  Reviews F.I.T.T. principles of resistance exercise including progression. Written material given at graduation.    Education: Exercise & Equipment Safety: - Individual verbal instruction and demonstration of equipment use and safety with use of the equipment. Flowsheet Row Cardiac Rehab from 06/26/2022 in [facility] Cardiac and Pulmonary Rehab  Date 06/26/22  Educator [facility]  Instruction Review Code 1- Verbalizes Understanding       Education: Exercise Physiology & General Exercise Guidelines: - Group verbal and written instruction with models to review the exercise physiology of the cardiovascular system and associated critical values.  Provides general exercise guidelines with specific guidelines to those with heart or lung disease.    Education: Flexibility, Balance, Mind/Body Relaxation: - Group verbal and visual presentation with interactive activity on the components of exercise prescription. Introduces F.I.T.T principle from ACSM for exercise prescriptions. Reviews F.I.T.T. principles of flexibility and balance exercise training including progression. Also discusses the mind body connection.  Reviews various relaxation techniques to help reduce and manage stress (i.e. Deep breathing, progressive muscle relaxation, and visualization). Balance handout provided to take home. Written material given at graduation.   Activity Barriers & Risk Stratification:  Activity Barriers & Cardiac Risk Stratification - 06/19/22 1428       Activity Barriers & Cardiac Risk Stratification   Activity Barriers Right Knee Replacement;Left Knee Replacement;Right Hip Replacement    Cardiac Risk Stratification Moderate  6 Minute Walk:  6 Minute Walk     Row Name 06/26/22 1504         6 Minute Walk   Phase Initial     Distance 615 feet     Walk Time 4 minutes     # of Rest Breaks 2     MPH 1.75     METS 1.39     RPE 14     Perceived Dyspnea  0     VO2 Peak 4.86     Symptoms Yes (comment)     Comments leg fatigue     Resting HR 78 bpm     Resting BP 130/74     Resting Oxygen Saturation  97 %     Exercise Oxygen Saturation  during 6 min walk 97 %     Max Ex. HR 720 bpm     Max Ex. BP 170/80     2 Minute Post BP 130/70              Oxygen Initial Assessment:   Oxygen Re-Evaluation:   Oxygen Discharge (Final Oxygen Re-Evaluation):   Initial Exercise Prescription:  Initial Exercise Prescription - 06/26/22 1500       Date of Initial Exercise RX and Referring Provider   Date 06/26/22    Referring Provider Arida      Oxygen   Maintain Oxygen Saturation 88% or higher      Treadmill   MPH 0.8     Grade 0    Minutes 15    METs 1.6      Recumbant Bike   Level 1    RPM 50    Minutes 15    METs 1.39      NuStep   Level 1    SPM 80    Minutes 15    METs 1.39      T5 Nustep   Level 1    SPM 80    Minutes 15    METs 1.39      Biostep-RELP   Level 1    SPM 50    Minutes 15    METs 1.39      Track   Laps 5    Minutes 15    METs 1.39      Prescription Details   Frequency (times per week) 3    Duration Progress to 30 minutes of continuous aerobic without signs/symptoms of physical distress      Intensity   THRR 40-80% of Max Heartrate 101-125    Ratings of Perceived Exertion 11-13    Perceived Dyspnea 0-4      Progression   Progression Continue to progress workloads to maintain intensity without signs/symptoms of physical distress.      Resistance Training   Training Prescription Yes    Weight 2 on left/ROM on right    Reps 10-15             Perform Capillary Blood Glucose checks as needed.  Exercise Prescription Changes:   Exercise Prescription Changes     Row Name 06/26/22 1500             Response to Exercise   Blood Pressure (Admit) 130/74       Blood Pressure (Exercise) 170/80       Blood Pressure (Exit) 130/70       Heart Rate (Admit) 78 bpm       Heart Rate (Exercise) 120 bpm  Heart Rate (Exit) 88 bpm       Oxygen Saturation (Admit) 97 %       Oxygen Saturation (Exercise) 97 %       Oxygen Saturation (Exit) 98 %       Rating of Perceived Exertion (Exercise) 14       Perceived Dyspnea (Exercise) 0       Symptoms fatigue       Comments 6 MWT results                Exercise Comments:   Exercise Goals and Review:   Exercise Goals     Row Name 06/26/22 1511             Exercise Goals   Increase Physical Activity Yes       Intervention Provide advice, education, support and counseling about physical activity/exercise needs.;Develop an individualized exercise prescription for aerobic and resistive training  based on initial evaluation findings, risk stratification, comorbidities and participant's personal goals.       Expected Outcomes Short Term: Attend rehab on a regular basis to increase amount of physical activity.;Long Term: Add in home exercise to make exercise part of routine and to increase amount of physical activity.;Long Term: Exercising regularly at least 3-5 days a week.       Increase Strength and Stamina Yes       Intervention Provide advice, education, support and counseling about physical activity/exercise needs.;Develop an individualized exercise prescription for aerobic and resistive training based on initial evaluation findings, risk stratification, comorbidities and participant's personal goals.       Expected Outcomes Short Term: Increase workloads from initial exercise prescription for resistance, speed, and METs.;Short Term: Perform resistance training exercises routinely during rehab and add in resistance training at home;Long Term: Improve cardiorespiratory fitness, muscular endurance and strength as measured by increased METs and functional capacity (6MWT)       Able to understand and use rate of perceived exertion (RPE) scale Yes       Intervention Provide education and explanation on how to use RPE scale       Expected Outcomes Short Term: Able to use RPE daily in rehab to express subjective intensity level;Long Term:  Able to use RPE to guide intensity level when exercising independently       Able to understand and use Dyspnea scale Yes       Intervention Provide education and explanation on how to use Dyspnea scale       Expected Outcomes Short Term: Able to use Dyspnea scale daily in rehab to express subjective sense of shortness of breath during exertion;Long Term: Able to use Dyspnea scale to guide intensity level when exercising independently       Knowledge and understanding of Target Heart Rate Range (THRR) Yes       Intervention Provide education and explanation of  THRR including how the numbers were predicted and where they are located for reference       Expected Outcomes Short Term: Able to state/look up THRR;Short Term: Able to use daily as guideline for intensity in rehab;Long Term: Able to use THRR to govern intensity when exercising independently       Able to check pulse independently Yes       Intervention Provide education and demonstration on how to check pulse in carotid and radial arteries.;Review the importance of being able to check your own pulse for safety during independent exercise       Expected  Outcomes Short Term: Able to explain why pulse checking is important during independent exercise;Long Term: Able to check pulse independently and accurately       Understanding of Exercise Prescription Yes       Intervention Provide education, explanation, and written materials on patient's individual exercise prescription       Expected Outcomes Short Term: Able to explain program exercise prescription;Long Term: Able to explain home exercise prescription to exercise independently                Exercise Goals Re-Evaluation :   Discharge Exercise Prescription (Final Exercise Prescription Changes):  Exercise Prescription Changes - 06/26/22 1500       Response to Exercise   Blood Pressure (Admit) 130/74    Blood Pressure (Exercise) 170/80    Blood Pressure (Exit) 130/70    Heart Rate (Admit) 78 bpm    Heart Rate (Exercise) 120 bpm    Heart Rate (Exit) 88 bpm    Oxygen Saturation (Admit) 97 %    Oxygen Saturation (Exercise) 97 %    Oxygen Saturation (Exit) 98 %    Rating of Perceived Exertion (Exercise) 14    Perceived Dyspnea (Exercise) 0    Symptoms fatigue    Comments 6 MWT results             Nutrition:  Target Goals: Understanding of nutrition guidelines, daily intake of sodium '1500mg'$ , cholesterol '200mg'$ , calories 30% from fat and 7% or less from saturated fats, daily to have 5 or more servings of fruits and  vegetables.  Education: All About Nutrition: -Group instruction provided by verbal, written material, interactive activities, discussions, models, and posters to present general guidelines for heart healthy nutrition including fat, fiber, MyPlate, the role of sodium in heart healthy nutrition, utilization of the nutrition label, and utilization of this knowledge for meal planning. Follow up email sent as well. Written material given at graduation.   Biometrics:  Pre Biometrics - 06/26/22 1511       Pre Biometrics   Height 5' (1.524 m)    Weight 141 lb 9.6 oz (64.2 kg)    BMI (Calculated) 27.65    Single Leg Stand 1.15 seconds              Nutrition Therapy Plan and Nutrition Goals:  Nutrition Therapy & Goals - 06/26/22 1515       Intervention Plan   Intervention Prescribe, educate and counsel regarding individualized specific dietary modifications aiming towards targeted core components such as weight, hypertension, lipid management, diabetes, heart failure and other comorbidities.    Expected Outcomes Short Term Goal: Understand basic principles of dietary content, such as calories, fat, sodium, cholesterol and nutrients.;Short Term Goal: A plan has been developed with personal nutrition goals set during dietitian appointment.;Long Term Goal: Adherence to prescribed nutrition plan.             Nutrition Assessments:  MEDIFICTS Score Key: ?70 Need to make dietary changes  40-70 Heart Healthy Diet ? 40 Therapeutic Level Cholesterol Diet  Flowsheet Row Cardiac Rehab from 06/26/2022 in [facility] Cardiac and Pulmonary Rehab  Picture Your Plate Total Score on Admission 64      Picture Your Plate Scores: <55 Unhealthy dietary pattern with much room for improvement. 41-50 Dietary pattern unlikely to meet recommendations for good health and room for improvement. 51-60 More healthful dietary pattern, with some room for improvement.  >60 Healthy dietary pattern, although there  may be some specific behaviors that could be improved.  Nutrition Goals Re-Evaluation:   Nutrition Goals Discharge (Final Nutrition Goals Re-Evaluation):   Psychosocial: Target Goals: Acknowledge presence or absence of significant depression and/or stress, maximize coping skills, provide positive support system. Participant is able to verbalize types and ability to use techniques and skills needed for reducing stress and depression.   Education: Stress, Anxiety, and Depression - Group verbal and visual presentation to define topics covered.  Reviews how body is impacted by stress, anxiety, and depression.  Also discusses healthy ways to reduce stress and to treat/manage anxiety and depression.  Written material given at graduation.   Education: Sleep Hygiene -Provides group verbal and written instruction about how sleep can affect your health.  Define sleep hygiene, discuss sleep cycles and impact of sleep habits. Review good sleep hygiene tips.    Initial Review & Psychosocial Screening:  Initial Psych Review & Screening - 06/19/22 1429       Initial Review   Current issues with None Identified      Family Dynamics   Good Support System? Yes   daughter     Barriers   Psychosocial barriers to participate in program There are no identifiable barriers or psychosocial needs.      Screening Interventions   Interventions Encouraged to exercise;To provide support and resources with identified psychosocial needs;Provide feedback about the scores to participant    Expected Outcomes Short Term goal: Utilizing psychosocial counselor, staff and physician to assist with identification of specific Stressors or current issues interfering with healing process. Setting desired goal for each stressor or current issue identified.;Long Term Goal: Stressors or current issues are controlled or eliminated.;Short Term goal: Identification and review with participant of any Quality of Life or Depression  concerns found by scoring the questionnaire.;Long Term goal: The participant improves quality of Life and PHQ9 Scores as seen by post scores and/or verbalization of changes             Quality of Life Scores:   Quality of Life - 06/26/22 1513       Quality of Life   Select Quality of Life      Quality of Life Scores   Health/Function Pre 25.8 %    Socioeconomic Pre 24.56 %    Psych/Spiritual Pre 25.71 %    Family Pre 22.2 %    GLOBAL Pre 24.99 %            Scores of 19 and below usually indicate a poorer quality of life in these areas.  A difference of  2-3 points is a clinically meaningful difference.  A difference of 2-3 points in the total score of the Quality of Life Index has been associated with significant improvement in overall quality of life, self-image, physical symptoms, and general health in studies assessing change in quality of life.  PHQ-9: Review Flowsheet  More data exists      06/26/2022 05/08/2022 04/28/2022 03/27/2022 02/21/2022  Depression screen PHQ 2/9  Decreased Interest 0 0 0 0 0  Down, Depressed, Hopeless 0 0 0 0 0  PHQ - 2 Score 0 0 0 0 0  Altered sleeping 0 0 - 0 0  Tired, decreased energy 2 0 - 0 0  Change in appetite 0 0 - 0 0  Feeling bad or failure about yourself  0 0 - 0 0  Trouble concentrating 0 0 - 0 0  Moving slowly or fidgety/restless 0 0 - 0 0  Suicidal thoughts 0 0 - 0 0  PHQ-9 Score 2  0 - 0 0  Difficult doing work/chores Somewhat difficult Not difficult at all - Not difficult at all -   Interpretation of Total Score  Total Score Depression Severity:  1-4 = Minimal depression, 5-9 = Mild depression, 10-14 = Moderate depression, 15-19 = Moderately severe depression, 20-27 = Severe depression   Psychosocial Evaluation and Intervention:  Psychosocial Evaluation - 06/19/22 1440       Psychosocial Evaluation & Interventions   Interventions Encouraged to exercise with the program and follow exercise prescription    Comments Andrea Hart  has no barriers to attending the program. She has great support from her daughter. She lives with her daughter.  She gets out for meals with friends and attends church. She wants to continue to improve with her shortness of breath symptoms. The symptoms have improved since her stent.  She is ready to get started and see if her symptoms improve even more.    Expected Outcomes STG Andrea Hart attends all scheduled sessions she sees improvement with her symptoms by dischargeLTG Andrea Hart continues with her exercise progression and symptom improvement.    Continue Psychosocial Services  Follow up required by staff             Psychosocial Re-Evaluation:   Psychosocial Discharge (Final Psychosocial Re-Evaluation):   Vocational Rehabilitation: Provide vocational rehab assistance to qualifying candidates.   Vocational Rehab Evaluation & Intervention:   Education: Education Goals: Education classes will be provided on a variety of topics geared toward better understanding of heart health and risk factor modification. Participant will state understanding/return demonstration of topics presented as noted by education test scores.  Learning Barriers/Preferences:   General Cardiac Education Topics:  AED/CPR: - Group verbal and written instruction with the use of models to demonstrate the basic use of the AED with the basic ABC's of resuscitation.   Anatomy and Cardiac Procedures: - Group verbal and visual presentation and models provide information about basic cardiac anatomy and function. Reviews the testing methods done to diagnose heart disease and the outcomes of the test results. Describes the treatment choices: Medical Management, Angioplasty, or Coronary Bypass Surgery for treating various heart conditions including Myocardial Infarction, Angina, Valve Disease, and Cardiac Arrhythmias.  Written material given at graduation.   Medication Safety: - Group verbal and visual instruction to  review commonly prescribed medications for heart and lung disease. Reviews the medication, class of the drug, and side effects. Includes the steps to properly store meds and maintain the prescription regimen.  Written material given at graduation.   Intimacy: - Group verbal instruction through game format to discuss how heart and lung disease can affect sexual intimacy. Written material given at graduation..   Know Your Numbers and Heart Failure: - Group verbal and visual instruction to discuss disease risk factors for cardiac and pulmonary disease and treatment options.  Reviews associated critical values for Overweight/Obesity, Hypertension, Cholesterol, and Diabetes.  Discusses basics of heart failure: signs/symptoms and treatments.  Introduces Heart Failure Zone chart for action plan for heart failure.  Written material given at graduation.   Infection Prevention: - Provides verbal and written material to individual with discussion of infection control including proper hand washing and proper equipment cleaning during exercise session. Flowsheet Row Cardiac Rehab from 06/26/2022 in [facility] Cardiac and Pulmonary Rehab  Date 06/26/22  Educator [facility]  Instruction Review Code 1- Verbalizes Understanding       Falls Prevention: - Provides verbal and written material to individual with discussion of falls prevention and safety. Flowsheet Row  Cardiac Rehab from 06/26/2022 in [facility] Cardiac and Pulmonary Rehab  Date 06/26/22  Educator [facility]  Instruction Review Code 1- Verbalizes Understanding       Other: -Provides group and verbal instruction on various topics (see comments)   Knowledge Questionnaire Score:  Knowledge Questionnaire Score - 06/26/22 1514       Knowledge Questionnaire Score   Pre Score 25/26             Core Components/Risk Factors/Patient Goals at Admission:  Personal Goals and Risk Factors at Admission - 06/26/22 1515       Core Components/Risk Factors/Patient  Goals on Admission    Weight Management Yes;Weight Maintenance    Intervention Weight Management: Develop a combined nutrition and exercise program designed to reach desired caloric intake, while maintaining appropriate intake of nutrient and fiber, sodium and fats, and appropriate energy expenditure required for the weight goal.;Weight Management: Provide education and appropriate resources to help participant work on and attain dietary goals.;Weight Management/Obesity: Establish reasonable short term and long term weight goals.    Admit Weight 141 lb 3.2 oz (64 kg)    Goal Weight: Short Term 140 lb (63.5 kg)    Goal Weight: Long Term 140 lb (63.5 kg)    Expected Outcomes Short Term: Continue to assess and modify interventions until short term weight is achieved;Long Term: Adherence to nutrition and physical activity/exercise program aimed toward attainment of established weight goal;Weight Maintenance: Understanding of the daily nutrition guidelines, which includes 25-35% calories from fat, 7% or less cal from saturated fats, less than '200mg'$  cholesterol, less than 1.5gm of sodium, & 5 or more servings of fruits and vegetables daily;Understanding recommendations for meals to include 15-35% energy as protein, 25-35% energy from fat, 35-60% energy from carbohydrates, less than '200mg'$  of dietary cholesterol, 20-35 gm of total fiber daily;Understanding of distribution of calorie intake throughout the day with the consumption of 4-5 meals/snacks    Hypertension Yes    Intervention Provide education on lifestyle modifcations including regular physical activity/exercise, weight management, moderate sodium restriction and increased consumption of fresh fruit, vegetables, and low fat dairy, alcohol moderation, and smoking cessation.;Monitor prescription use compliance.    Expected Outcomes Short Term: Continued assessment and intervention until BP is < 140/21m HG in hypertensive participants. < 130/842mHG in  hypertensive participants with diabetes, heart failure or chronic kidney disease.;Long Term: Maintenance of blood pressure at goal levels.    Lipids Yes    Intervention Provide education and support for participant on nutrition & aerobic/resistive exercise along with prescribed medications to achieve LDL '70mg'$ , HDL >'40mg'$ .    Expected Outcomes Short Term: Participant states understanding of desired cholesterol values and is compliant with medications prescribed. Participant is following exercise prescription and nutrition guidelines.;Long Term: Cholesterol controlled with medications as prescribed, with individualized exercise RX and with personalized nutrition plan. Value goals: LDL < '70mg'$ , HDL > 40 mg.             Education:Diabetes - Individual verbal and written instruction to review signs/symptoms of diabetes, desired ranges of glucose level fasting, after meals and with exercise. Acknowledge that pre and post exercise glucose checks will be done for 3 sessions at entry of program.   Core Components/Risk Factors/Patient Goals Review:    Core Components/Risk Factors/Patient Goals at Discharge (Final Review):    ITP Comments:  ITP Comments     Row Name 06/19/22 1458 06/26/22 1503         ITP Comments Virtual orientation call completed today.  shehas an appointment on Date: 06/26/2022  for EP eval and gym Orientation.  Documentation of diagnosis [facility] be found in [facility] Date: 05/24/2022 . Completed 6MWT and gym orientation. Initial ITP created and sent for review to Dr. Emily Filbert, Medical Director.               Comments: initial ITP

## 2022-06-26 NOTE — Patient Instructions (Signed)
Patient Instructions  Patient Details  Name: Andrea Hart MRN: [id number] Date of Birth: 1936-07-06 Referring Provider:  Wellington Hampshire, MD  Below are your personal goals for exercise, nutrition, and risk factors. Our goal is to help you stay on track towards obtaining and maintaining these goals. We will be discussing your progress on these goals with you throughout the program.  Initial Exercise Prescription:  Initial Exercise Prescription - 06/26/22 1500       Date of Initial Exercise RX and Referring Provider   Date 06/26/22    Referring Provider Arida      Oxygen   Maintain Oxygen Saturation 88% or higher      Treadmill   MPH 0.8    Grade 0    Minutes 15    METs 1.6      Recumbant Bike   Level 1    RPM 50    Minutes 15    METs 1.39      NuStep   Level 1    SPM 80    Minutes 15    METs 1.39      T5 Nustep   Level 1    SPM 80    Minutes 15    METs 1.39      Biostep-RELP   Level 1    SPM 50    Minutes 15    METs 1.39      Track   Laps 5    Minutes 15    METs 1.39      Prescription Details   Frequency (times per week) 3    Duration Progress to 30 minutes of continuous aerobic without signs/symptoms of physical distress      Intensity   THRR 40-80% of Max Heartrate 101-125    Ratings of Perceived Exertion 11-13    Perceived Dyspnea 0-4      Progression   Progression Continue to progress workloads to maintain intensity without signs/symptoms of physical distress.      Resistance Training   Training Prescription Yes    Weight 2 on left/ROM on right    Reps 10-15             Exercise Goals: Frequency: Be able to perform aerobic exercise two to three times per week in program working toward 2-5 days per week of home exercise.  Intensity: Work with a perceived exertion of 11 (fairly light) - 15 (hard) while following your exercise prescription.  We will make changes to your prescription with you as you progress through the program.    Duration: Be able to do 30 to 45 minutes of continuous aerobic exercise in addition to a 5 minute warm-up and a 5 minute cool-down routine.   Nutrition Goals: Your personal nutrition goals will be established when you do your nutrition analysis with the dietician.  The following are general nutrition guidelines to follow: Cholesterol < '200mg'$ /day Sodium < '1500mg'$ /day Fiber: Women over 50 yrs - 21 grams per day  Personal Goals:  Personal Goals and Risk Factors at Admission - 06/26/22 1515       Core Components/Risk Factors/Patient Goals on Admission    Weight Management Yes;Weight Maintenance    Intervention Weight Management: Develop a combined nutrition and exercise program designed to reach desired caloric intake, while maintaining appropriate intake of nutrient and fiber, sodium and fats, and appropriate energy expenditure required for the weight goal.;Weight Management: Provide education and appropriate resources to help participant work on and attain dietary goals.;Weight Management/Obesity:  Establish reasonable short term and long term weight goals.    Admit Weight 141 lb 3.2 oz (64 kg)    Goal Weight: Short Term 140 lb (63.5 kg)    Goal Weight: Long Term 140 lb (63.5 kg)    Expected Outcomes Short Term: Continue to assess and modify interventions until short term weight is achieved;Long Term: Adherence to nutrition and physical activity/exercise program aimed toward attainment of established weight goal;Weight Maintenance: Understanding of the daily nutrition guidelines, which includes 25-35% calories from fat, 7% or less cal from saturated fats, less than '200mg'$  cholesterol, less than 1.5gm of sodium, & 5 or more servings of fruits and vegetables daily;Understanding recommendations for meals to include 15-35% energy as protein, 25-35% energy from fat, 35-60% energy from carbohydrates, less than '200mg'$  of dietary cholesterol, 20-35 gm of total fiber daily;Understanding of distribution of  calorie intake throughout the day with the consumption of 4-5 meals/snacks    Hypertension Yes    Intervention Provide education on lifestyle modifcations including regular physical activity/exercise, weight management, moderate sodium restriction and increased consumption of fresh fruit, vegetables, and low fat dairy, alcohol moderation, and smoking cessation.;Monitor prescription use compliance.    Expected Outcomes Short Term: Continued assessment and intervention until BP is < 140/78m HG in hypertensive participants. < 130/874mHG in hypertensive participants with diabetes, heart failure or chronic kidney disease.;Long Term: Maintenance of blood pressure at goal levels.    Lipids Yes    Intervention Provide education and support for participant on nutrition & aerobic/resistive exercise along with prescribed medications to achieve LDL '70mg'$ , HDL >'40mg'$ .    Expected Outcomes Short Term: Participant states understanding of desired cholesterol values and is compliant with medications prescribed. Participant is following exercise prescription and nutrition guidelines.;Long Term: Cholesterol controlled with medications as prescribed, with individualized exercise RX and with personalized nutrition plan. Value goals: LDL < '70mg'$ , HDL > 40 mg.             Tobacco Use Initial Evaluation: Social History   Tobacco Use  Smoking Status Never  Smokeless Tobacco Never    Exercise Goals and Review:  Exercise Goals     Row Name 06/26/22 1511             Exercise Goals   Increase Physical Activity Yes       Intervention Provide advice, education, support and counseling about physical activity/exercise needs.;Develop an individualized exercise prescription for aerobic and resistive training based on initial evaluation findings, risk stratification, comorbidities and participant's personal goals.       Expected Outcomes Short Term: Attend rehab on a regular basis to increase amount of physical  activity.;Long Term: Add in home exercise to make exercise part of routine and to increase amount of physical activity.;Long Term: Exercising regularly at least 3-5 days a week.       Increase Strength and Stamina Yes       Intervention Provide advice, education, support and counseling about physical activity/exercise needs.;Develop an individualized exercise prescription for aerobic and resistive training based on initial evaluation findings, risk stratification, comorbidities and participant's personal goals.       Expected Outcomes Short Term: Increase workloads from initial exercise prescription for resistance, speed, and METs.;Short Term: Perform resistance training exercises routinely during rehab and add in resistance training at home;Long Term: Improve cardiorespiratory fitness, muscular endurance and strength as measured by increased METs and functional capacity (6MWT)       Able to understand and use rate of perceived exertion (RPE)  scale Yes       Intervention Provide education and explanation on how to use RPE scale       Expected Outcomes Short Term: Able to use RPE daily in rehab to express subjective intensity level;Long Term:  Able to use RPE to guide intensity level when exercising independently       Able to understand and use Dyspnea scale Yes       Intervention Provide education and explanation on how to use Dyspnea scale       Expected Outcomes Short Term: Able to use Dyspnea scale daily in rehab to express subjective sense of shortness of breath during exertion;Long Term: Able to use Dyspnea scale to guide intensity level when exercising independently       Knowledge and understanding of Target Heart Rate Range (THRR) Yes       Intervention Provide education and explanation of THRR including how the numbers were predicted and where they are located for reference       Expected Outcomes Short Term: Able to state/look up THRR;Short Term: Able to use daily as guideline for intensity in  rehab;Long Term: Able to use THRR to govern intensity when exercising independently       Able to check pulse independently Yes       Intervention Provide education and demonstration on how to check pulse in carotid and radial arteries.;Review the importance of being able to check your own pulse for safety during independent exercise       Expected Outcomes Short Term: Able to explain why pulse checking is important during independent exercise;Long Term: Able to check pulse independently and accurately       Understanding of Exercise Prescription Yes       Intervention Provide education, explanation, and written materials on patient's individual exercise prescription       Expected Outcomes Short Term: Able to explain program exercise prescription;Long Term: Able to explain home exercise prescription to exercise independently                Copy of goals given to participant.

## 2022-06-28 ENCOUNTER — Encounter: Payer: Medicare Other | Admitting: *Deleted

## 2022-06-28 DIAGNOSIS — Z955 Presence of coronary angioplasty implant and graft: Secondary | ICD-10-CM | POA: Diagnosis not present

## 2022-06-28 NOTE — Progress Notes (Signed)
Daily Session Note  Patient Details  Name: Andrea Hart MRN: [id number] Date of Birth: 1936-08-17 Referring Provider:   Flowsheet Row Cardiac Rehab from 06/26/2022 in [facility] Cardiac and Pulmonary Rehab  Referring Provider Arida       Encounter Date: 06/28/2022  Check In:  Session Check In - 06/28/22 1439       Check-In   Supervising physician immediately available to respond to emergencies See telemetry face sheet for immediately available ER MD    Location [facility]-Cardiac & Pulmonary Rehab    Staff Present Nyoka Cowden, RN, BSN, MA;Meredith Sherryll Burger, RN BSN;Melissa Tilford Pillar, RDN, LDN    Virtual Visit No    Medication changes reported     No    Fall or balance concerns reported    No    Tobacco Cessation No Change    Warm-up and Cool-down Performed on first and last piece of equipment    Resistance Training Performed Yes    VAD Patient? No    PAD/SET Patient? No      Pain Assessment   Currently in Pain? No/denies                Social History   Tobacco Use  Smoking Status Never  Smokeless Tobacco Never    Goals Met:  Independence with exercise equipment Exercise tolerated well No report of concerns or symptoms today  Goals Unmet:  Not Applicable  Comments: Pt able to follow exercise prescription today without complaint.  Will continue to monitor for progression.    Dr. Emily Filbert is Medical Director for [place].  Dr. Ottie Glazier is Medical Director for [facility] Pulmonary Rehabilitation.

## 2022-06-29 ENCOUNTER — Encounter: Payer: Medicare Other | Admitting: *Deleted

## 2022-06-29 DIAGNOSIS — Z955 Presence of coronary angioplasty implant and graft: Secondary | ICD-10-CM | POA: Diagnosis not present

## 2022-06-29 NOTE — Progress Notes (Signed)
Daily Session Note  Patient Details  Name: Andrea Hart MRN: [id number] Date of Birth: [birth year] Referring Provider:   Flowsheet Row Cardiac Rehab from 06/26/2022 in [facility] Cardiac and Pulmonary Rehab  Referring Provider Fletcher Anon       Encounter Date: 06/29/2022  Check In:  Session Check In - 06/29/22 1405       Check-In   Supervising physician immediately available to respond to emergencies See telemetry face sheet for immediately available ER MD    Location [facility]-Cardiac & Pulmonary Rehab    Staff Present Alberteen Sam, MA, RCEP, CCRP, CCET;Melissa [place], RDN, LDN;Kush Farabee Sherryll Burger, RN BSN    Virtual Visit No    Medication changes reported     No    Fall or balance concerns reported    No    Warm-up and Cool-down Performed on first and last piece of equipment    Resistance Training Performed Yes    VAD Patient? No    PAD/SET Patient? No      Pain Assessment   Currently in Pain? No/denies                Social History   Tobacco Use  Smoking Status Never  Smokeless Tobacco Never    Goals Met:  Independence with exercise equipment Exercise tolerated well No report of concerns or symptoms today Strength training completed today  Goals Unmet:  Not Applicable  Comments: Pt able to follow exercise prescription today without complaint.  Will continue to monitor for progression.    Dr. Emily Filbert is Medical Director for [place].  Dr. Ottie Glazier is Medical Director for [facility] Pulmonary Rehabilitation.

## 2022-06-29 NOTE — Telephone Encounter (Signed)
Left voicemail message to call back for review of results and recommendations.  

## 2022-06-30 ENCOUNTER — Encounter: Payer: Self-pay | Admitting: *Deleted

## 2022-06-30 NOTE — Telephone Encounter (Signed)
Left voicemail message to call back for review of results. Call attempt X 3 so will mail letter to patient with results and to call back if any further questions.

## 2022-06-30 NOTE — Telephone Encounter (Signed)
Spoke with patient and daughter to review results and recommendations. She verbalized understanding with no further questions at this time.

## 2022-07-03 ENCOUNTER — Encounter: Payer: Medicare Other | Admitting: *Deleted

## 2022-07-03 DIAGNOSIS — Z955 Presence of coronary angioplasty implant and graft: Secondary | ICD-10-CM | POA: Diagnosis not present

## 2022-07-03 NOTE — Progress Notes (Signed)
Daily Session Note  Patient Details  Name: Andrea Hart MRN: [id number] Date of Birth: [birth year] Referring Provider:   Flowsheet Row Cardiac Rehab from 06/26/2022 in [facility] Cardiac and Pulmonary Rehab  Referring Provider Fletcher Anon       Encounter Date: 07/03/2022  Check In:  Session Check In - 07/03/22 1407       Check-In   Supervising physician immediately available to respond to emergencies See telemetry face sheet for immediately available ER MD    Location [facility]-Cardiac & Pulmonary Rehab    Staff Present Nyoka Cowden, RN, BSN, MA;Meredith Sherryll Burger, RN BSN    Virtual Visit No    Medication changes reported     No    Fall or balance concerns reported    No    Tobacco Cessation No Change    Warm-up and Cool-down Performed on first and last piece of equipment    Resistance Training Performed Yes    VAD Patient? No    PAD/SET Patient? No      Pain Assessment   Currently in Pain? No/denies                Social History   Tobacco Use  Smoking Status Never  Smokeless Tobacco Never    Goals Met:  Independence with exercise equipment Exercise tolerated well No report of concerns or symptoms today  Goals Unmet:  Not Applicable  Comments: Pt able to follow exercise prescription today without complaint.  Will continue to monitor for progression.    Dr. Emily Filbert is Medical Director for [place].  Dr. Ottie Glazier is Medical Director for [facility] Pulmonary Rehabilitation.

## 2022-07-05 ENCOUNTER — Encounter: Payer: Medicare Other | Attending: Cardiovascular Disease | Admitting: *Deleted

## 2022-07-05 DIAGNOSIS — R7309 Other abnormal glucose: Secondary | ICD-10-CM | POA: Insufficient documentation

## 2022-07-05 DIAGNOSIS — I208 Other forms of angina pectoris: Secondary | ICD-10-CM | POA: Diagnosis not present

## 2022-07-05 DIAGNOSIS — Z48812 Encounter for surgical aftercare following surgery on the circulatory system: Secondary | ICD-10-CM | POA: Diagnosis not present

## 2022-07-05 DIAGNOSIS — Z955 Presence of coronary angioplasty implant and graft: Secondary | ICD-10-CM | POA: Insufficient documentation

## 2022-07-05 NOTE — Progress Notes (Signed)
Daily Session Note  Patient Details  Name: Andrea Hart MRN: [id number] Date of Birth: [birth year] Referring Provider:   Flowsheet Row Cardiac Rehab from 06/26/2022 in [facility] Cardiac and Pulmonary Rehab  Referring Provider Fletcher Anon       Encounter Date: 07/05/2022  Check In:  Session Check In - 07/05/22 1411       Check-In   Supervising physician immediately available to respond to emergencies See telemetry face sheet for immediately available ER MD    Location [facility]-Cardiac & Pulmonary Rehab    Staff Present Nyoka Cowden, RN, BSN, MA;Meredith Sherryll Burger, RN BSN    Virtual Visit No    Medication changes reported     No    Fall or balance concerns reported    No    Tobacco Cessation No Change    Warm-up and Cool-down Performed on first and last piece of equipment    Resistance Training Performed Yes    VAD Patient? No    PAD/SET Patient? No      Pain Assessment   Currently in Pain? No/denies                Social History   Tobacco Use  Smoking Status Never  Smokeless Tobacco Never    Goals Met:  Independence with exercise equipment Exercise tolerated well No report of concerns or symptoms today  Goals Unmet:  Not Applicable  Comments: Pt able to follow exercise prescription today without complaint.  Will continue to monitor for progression.    Dr. Emily Filbert is Medical Director for [place].  Dr. Ottie Glazier is Medical Director for [facility] Pulmonary Rehabilitation.

## 2022-07-06 ENCOUNTER — Encounter: Payer: Medicare Other | Admitting: *Deleted

## 2022-07-06 ENCOUNTER — Other Ambulatory Visit: Payer: Self-pay | Admitting: Family Medicine

## 2022-07-06 DIAGNOSIS — I208 Other forms of angina pectoris: Secondary | ICD-10-CM | POA: Diagnosis not present

## 2022-07-06 DIAGNOSIS — Z955 Presence of coronary angioplasty implant and graft: Secondary | ICD-10-CM

## 2022-07-06 DIAGNOSIS — Z48812 Encounter for surgical aftercare following surgery on the circulatory system: Secondary | ICD-10-CM | POA: Diagnosis not present

## 2022-07-06 DIAGNOSIS — E785 Hyperlipidemia, unspecified: Secondary | ICD-10-CM

## 2022-07-06 DIAGNOSIS — R7309 Other abnormal glucose: Secondary | ICD-10-CM | POA: Diagnosis not present

## 2022-07-06 NOTE — Progress Notes (Signed)
Daily Session Note  Patient Details  Name: Andrea Hart MRN: [id number] Date of Birth: 07-02-1936 Referring Provider:   Flowsheet Row Cardiac Rehab from 06/26/2022 in [facility] Cardiac and Pulmonary Rehab  Referring Provider Fletcher Anon       Encounter Date: 07/06/2022  Check In:  Session Check In - 07/06/22 1413       Check-In   Supervising physician immediately available to respond to emergencies See telemetry face sheet for immediately available ER MD    Location [facility]-Cardiac & Pulmonary Rehab    Staff Present Heath Lark, RN, BSN, CCRP;Noah Tickle, BS, Exercise Physiologist;Kalene Cutler Sherryll Burger, RN BSN    Virtual Visit No    Medication changes reported     No    Fall or balance concerns reported    No    Warm-up and Cool-down Performed on first and last piece of equipment    Resistance Training Performed Yes    VAD Patient? No      Pain Assessment   Currently in Pain? No/denies                Social History   Tobacco Use  Smoking Status Never  Smokeless Tobacco Never    Goals Met:  Independence with exercise equipment Exercise tolerated well No report of concerns or symptoms today Strength training completed today  Goals Unmet:  Not Applicable  Comments: Pt able to follow exercise prescription today without complaint.  Will continue to monitor for progression.    Dr. Emily Filbert is Medical Director for [place].  Dr. Ottie Glazier is Medical Director for [facility] Pulmonary Rehabilitation.

## 2022-07-10 ENCOUNTER — Encounter: Payer: Medicare Other | Admitting: *Deleted

## 2022-07-10 DIAGNOSIS — I208 Other forms of angina pectoris: Secondary | ICD-10-CM | POA: Diagnosis not present

## 2022-07-10 DIAGNOSIS — Z955 Presence of coronary angioplasty implant and graft: Secondary | ICD-10-CM | POA: Diagnosis not present

## 2022-07-10 DIAGNOSIS — Z48812 Encounter for surgical aftercare following surgery on the circulatory system: Secondary | ICD-10-CM | POA: Diagnosis not present

## 2022-07-10 DIAGNOSIS — R7309 Other abnormal glucose: Secondary | ICD-10-CM | POA: Diagnosis not present

## 2022-07-10 NOTE — Progress Notes (Signed)
Daily Session Note  Patient Details  Name: Andrea Hart MRN: [id number] Date of Birth: 1936/11/21 Referring Provider:   Flowsheet Row Cardiac Rehab from 06/26/2022 in [facility] Cardiac and Pulmonary Rehab  Referring Provider Fletcher Anon       Encounter Date: 07/10/2022  Check In:      Social History   Tobacco Use  Smoking Status Never  Smokeless Tobacco Never    Goals Met:  Independence with exercise equipment Exercise tolerated well No report of concerns or symptoms today  Goals Unmet:  Not Applicable  Comments: Pt able to follow exercise prescription today without complaint.  Will continue to monitor for progression.    Dr. Emily Filbert is Medical Director for [place].  Dr. Ottie Glazier is Medical Director for [facility] Pulmonary Rehabilitation.

## 2022-07-11 ENCOUNTER — Other Ambulatory Visit: Payer: Self-pay | Admitting: Family Medicine

## 2022-07-11 DIAGNOSIS — I10 Essential (primary) hypertension: Secondary | ICD-10-CM

## 2022-07-12 ENCOUNTER — Encounter: Payer: Self-pay | Admitting: *Deleted

## 2022-07-12 DIAGNOSIS — R7309 Other abnormal glucose: Secondary | ICD-10-CM | POA: Diagnosis not present

## 2022-07-12 DIAGNOSIS — I208 Other forms of angina pectoris: Secondary | ICD-10-CM | POA: Diagnosis not present

## 2022-07-12 DIAGNOSIS — Z955 Presence of coronary angioplasty implant and graft: Secondary | ICD-10-CM

## 2022-07-12 DIAGNOSIS — Z48812 Encounter for surgical aftercare following surgery on the circulatory system: Secondary | ICD-10-CM | POA: Diagnosis not present

## 2022-07-12 NOTE — Progress Notes (Signed)
Daily Session Note  Patient Details  Name: Andrea Hart MRN: [id number] Date of Birth: 1936/10/29 Referring Provider:   Flowsheet Row Cardiac Rehab from 06/26/2022 in [facility] Cardiac and Pulmonary Rehab  Referring Provider Fletcher Anon       Encounter Date: 07/12/2022  Check In:      Social History   Tobacco Use  Smoking Status Never  Smokeless Tobacco Never    Goals Met:  Independence with exercise equipment Exercise tolerated well No report of concerns or symptoms today  Goals Unmet:  Not Applicable  Comments: Pt able to follow exercise prescription today without complaint.  Will continue to monitor for progression.    Dr. Emily Filbert is Medical Director for [place].  Dr. Ottie Glazier is Medical Director for [facility] Pulmonary Rehabilitation.

## 2022-07-12 NOTE — Progress Notes (Signed)
Cardiac Individual Treatment Plan  Patient Details  Name: Andrea Hart MRN: [id number] Date of Birth: 1936-03-20 Referring Provider:   Flowsheet Row Cardiac Rehab from 06/26/2022 in [facility] Cardiac and Pulmonary Rehab  Referring Provider Arida       Initial Encounter Date:  Flowsheet Row Cardiac Rehab from 06/26/2022 in [facility] Cardiac and Pulmonary Rehab  Date 06/26/22       Visit Diagnosis: Status post coronary artery stent placement  Patient's Home Medications on Admission:  Current Outpatient Medications:    ADVAIR DISKUS 500-50 MCG/ACT AEPB, INHALE 1 PUFF INTO THE LUNGS TWICE A DAY, Disp: 180 each, Rfl: 3   albuterol (VENTOLIN HFA) 108 (90 Base) MCG/ACT inhaler, Inhale 2 puffs into the lungs every 4 (four) hours as needed for wheezing or shortness of breath., Disp: 6.7 g, Rfl: 11   amLODipine (NORVASC) 10 MG tablet, TAKE 1 TABLET BY MOUTH EVERY DAY, Disp: 90 tablet, Rfl: 1   aspirin EC 81 MG tablet, Take 81 mg by mouth daily., Disp: , Rfl:    budesonide-formoterol (SYMBICORT) 160-4.5 MCG/ACT inhaler, Inhale 2 puffs into the lungs 2 (two) times daily., Disp: 3 each, Rfl: 3   carvedilol (COREG) 3.125 MG tablet, Take 1 tablet (3.125 mg total) by mouth 2 (two) times daily. (Patient not taking: Reported on 06/19/2022), Disp: 180 tablet, Rfl: 3   cetirizine (ZYRTEC) 10 MG tablet, Take 1 tablet (10 mg total) by mouth daily., Disp: 90 tablet, Rfl: 3   clopidogrel (PLAVIX) 75 MG tablet, Take 1 tablet (75 mg total) by mouth daily., Disp: 30 tablet, Rfl: 5   fluticasone (FLONASE) 50 MCG/ACT nasal spray, Place 2 sprays into both nostrils daily., Disp: 16 g, Rfl: 6   levalbuterol (XOPENEX) 1.25 MG/3ML nebulizer solution, Take 1.25 mg by nebulization every 6 (six) hours as needed for wheezing. (Patient not taking: Reported on 06/19/2022), Disp: 100 mL, Rfl: 1   levothyroxine (SYNTHROID) 75 MCG tablet, Take 1 tablet (75 mcg total) by mouth daily before breakfast., Disp: 30 tablet, Rfl: 5   meclizine  (ANTIVERT) 25 MG tablet, Take 0.5 tablets (12.5 mg total) by mouth 3 (three) times daily as needed for dizziness or nausea., Disp: 30 tablet, Rfl: 1   methocarbamol (ROBAXIN) 500 MG tablet, Take 1 tablet (500 mg total) by mouth every 8 (eight) hours as needed for muscle spasms., Disp: 30 tablet, Rfl: 0   montelukast (SINGULAIR) 10 MG tablet, Take 1 tablet (10 mg total) by mouth at bedtime., Disp: 30 tablet, Rfl: 5   omeprazole (PRILOSEC) 40 MG capsule, Take 1 capsule (40 mg total) by mouth 2 (two) times daily., Disp: 60 capsule, Rfl: 5   rosuvastatin (CRESTOR) 20 MG tablet, TAKE 1 TABLET BY MOUTH EVERY DAY, Disp: 90 tablet, Rfl: 1   spironolactone (ALDACTONE) 25 MG tablet, Take 0.5 tablets (12.5 mg total) by mouth daily., Disp: 15 tablet, Rfl: 5   spironolactone (ALDACTONE) 25 MG tablet, Take 12.5 mg by mouth daily., Disp: , Rfl:    Turmeric 500 MG TABS, Take 1,000 mg by mouth daily., Disp: , Rfl:    valsartan (DIOVAN) 320 MG tablet, Take 1 tablet (320 mg total) by mouth daily., Disp: 30 tablet, Rfl: 5  Past Medical History: Past Medical History:  Diagnosis Date   Abdominal hernia    Arthritis    Asthma    Carotid arterial disease ([place])    a. 11/2020 Carotid U/S: Less than 62% RICA, 56-38% LICA.   CKD (chronic kidney disease), stage III ([place])  Coronary artery disease    a. 2013 s/p PCI and stent placement in [facility] done for stable angina.   GI bleeding 02/02/2021   Hyperlipidemia    Hypertension    Hypothyroidism    PVC's (premature ventricular contractions)    a. 01/2020 Zio: RSR, 70, frequent PVCs with 10.5% burden.   Stroke [facility])    a. 2017 - ILR did not show afib.    Tobacco Use: Social History   Tobacco Use  Smoking Status Never  Smokeless Tobacco Never    Labs: Review Flowsheet  More data may exist      Latest Ref Rng & Units 10/28/2018 12/19/2019 08/16/2020 07/21/2021 01/23/2022  Labs for ITP Cardiac and Pulmonary Rehab  Cholestrol 100 - 199 mg/dL  172  183  145  137  -  LDL (calc) 0 - 99 mg/dL 80  87  65  64  -  HDL-C >39 mg/dL 64.10  59.60  60  50  -  Trlycerides 0 - 149 mg/dL 142.0  182.0  111  128  -  Hemoglobin A1c 4.8 - 5.6 % - - - 6.3  6.0      Exercise Target Goals: Exercise Program Goal: Individual exercise prescription set using results from initial 6 min walk test and THRR while considering  patient's activity barriers and safety.   Exercise Prescription Goal: Initial exercise prescription builds to 30-45 minutes a day of aerobic activity, 2-3 days per week.  Home exercise guidelines will be given to patient during program as part of exercise prescription that the participant will acknowledge.   Education: Aerobic Exercise: - Group verbal and visual presentation on the components of exercise prescription. Introduces F.I.T.T principle from ACSM for exercise prescriptions.  Reviews F.I.T.T. principles of aerobic exercise including progression. Written material given at graduation. Flowsheet Row Cardiac Rehab from 07/05/2022 in [facility] Cardiac and Pulmonary Rehab  Education need identified 06/26/22       Education: Resistance Exercise: - Group verbal and visual presentation on the components of exercise prescription. Introduces F.I.T.T principle from ACSM for exercise prescriptions  Reviews F.I.T.T. principles of resistance exercise including progression. Written material given at graduation.    Education: Exercise & Equipment Safety: - Individual verbal instruction and demonstration of equipment use and safety with use of the equipment. Flowsheet Row Cardiac Rehab from 07/05/2022 in [facility] Cardiac and Pulmonary Rehab  Date 06/26/22  Educator [facility]  Instruction Review Code 1- Verbalizes Understanding       Education: Exercise Physiology & General Exercise Guidelines: - Group verbal and written instruction with models to review the exercise physiology of the cardiovascular system and associated critical values. Provides  general exercise guidelines with specific guidelines to those with heart or lung disease.    Education: Flexibility, Balance, Mind/Body Relaxation: - Group verbal and visual presentation with interactive activity on the components of exercise prescription. Introduces F.I.T.T principle from ACSM for exercise prescriptions. Reviews F.I.T.T. principles of flexibility and balance exercise training including progression. Also discusses the mind body connection.  Reviews various relaxation techniques to help reduce and manage stress (i.e. Deep breathing, progressive muscle relaxation, and visualization). Balance handout provided to take home. Written material given at graduation.   Activity Barriers & Risk Stratification:  Activity Barriers & Cardiac Risk Stratification - 06/19/22 1428       Activity Barriers & Cardiac Risk Stratification   Activity Barriers Right Knee Replacement;Left Knee Replacement;Right Hip Replacement    Cardiac Risk Stratification Moderate  6 Minute Walk:  6 Minute Walk     Row Name 06/26/22 1504         6 Minute Walk   Phase Initial     Distance 615 feet     Walk Time 4 minutes     # of Rest Breaks 2     MPH 1.75     METS 1.39     RPE 14     Perceived Dyspnea  0     VO2 Peak 4.86     Symptoms Yes (comment)     Comments leg fatigue     Resting HR 78 bpm     Resting BP 130/74     Resting Oxygen Saturation  97 %     Exercise Oxygen Saturation  during 6 min walk 97 %     Max Ex. HR 720 bpm     Max Ex. BP 170/80     2 Minute Post BP 130/70              Oxygen Initial Assessment:   Oxygen Re-Evaluation:   Oxygen Discharge (Final Oxygen Re-Evaluation):   Initial Exercise Prescription:  Initial Exercise Prescription - 06/26/22 1500       Date of Initial Exercise RX and Referring Provider   Date 06/26/22    Referring Provider Arida      Oxygen   Maintain Oxygen Saturation 88% or higher      Treadmill   MPH 0.8    Grade 0     Minutes 15    METs 1.6      Recumbant Bike   Level 1    RPM 50    Minutes 15    METs 1.39      NuStep   Level 1    SPM 80    Minutes 15    METs 1.39      T5 Nustep   Level 1    SPM 80    Minutes 15    METs 1.39      Biostep-RELP   Level 1    SPM 50    Minutes 15    METs 1.39      Track   Laps 5    Minutes 15    METs 1.39      Prescription Details   Frequency (times per week) 3    Duration Progress to 30 minutes of continuous aerobic without signs/symptoms of physical distress      Intensity   THRR 40-80% of Max Heartrate 101-125    Ratings of Perceived Exertion 11-13    Perceived Dyspnea 0-4      Progression   Progression Continue to progress workloads to maintain intensity without signs/symptoms of physical distress.      Resistance Training   Training Prescription Yes    Weight 2 on left/ROM on right    Reps 10-15             Perform Capillary Blood Glucose checks as needed.  Exercise Prescription Changes:   Exercise Prescription Changes     Row Name 06/26/22 1500             Response to Exercise   Blood Pressure (Admit) 130/74       Blood Pressure (Exercise) 170/80       Blood Pressure (Exit) 130/70       Heart Rate (Admit) 78 bpm       Heart Rate (Exercise) 120 bpm  Heart Rate (Exit) 88 bpm       Oxygen Saturation (Admit) 97 %       Oxygen Saturation (Exercise) 97 %       Oxygen Saturation (Exit) 98 %       Rating of Perceived Exertion (Exercise) 14       Perceived Dyspnea (Exercise) 0       Symptoms fatigue       Comments 6 MWT results                Exercise Comments:   Exercise Goals and Review:   Exercise Goals     Row Name 06/26/22 1511             Exercise Goals   Increase Physical Activity Yes       Intervention Provide advice, education, support and counseling about physical activity/exercise needs.;Develop an individualized exercise prescription for aerobic and resistive training based on  initial evaluation findings, risk stratification, comorbidities and participant's personal goals.       Expected Outcomes Short Term: Attend rehab on a regular basis to increase amount of physical activity.;Long Term: Add in home exercise to make exercise part of routine and to increase amount of physical activity.;Long Term: Exercising regularly at least 3-5 days a week.       Increase Strength and Stamina Yes       Intervention Provide advice, education, support and counseling about physical activity/exercise needs.;Develop an individualized exercise prescription for aerobic and resistive training based on initial evaluation findings, risk stratification, comorbidities and participant's personal goals.       Expected Outcomes Short Term: Increase workloads from initial exercise prescription for resistance, speed, and METs.;Short Term: Perform resistance training exercises routinely during rehab and add in resistance training at home;Long Term: Improve cardiorespiratory fitness, muscular endurance and strength as measured by increased METs and functional capacity (6MWT)       Able to understand and use rate of perceived exertion (RPE) scale Yes       Intervention Provide education and explanation on how to use RPE scale       Expected Outcomes Short Term: Able to use RPE daily in rehab to express subjective intensity level;Long Term:  Able to use RPE to guide intensity level when exercising independently       Able to understand and use Dyspnea scale Yes       Intervention Provide education and explanation on how to use Dyspnea scale       Expected Outcomes Short Term: Able to use Dyspnea scale daily in rehab to express subjective sense of shortness of breath during exertion;Long Term: Able to use Dyspnea scale to guide intensity level when exercising independently       Knowledge and understanding of Target Heart Rate Range (THRR) Yes       Intervention Provide education and explanation of THRR  including how the numbers were predicted and where they are located for reference       Expected Outcomes Short Term: Able to state/look up THRR;Short Term: Able to use daily as guideline for intensity in rehab;Long Term: Able to use THRR to govern intensity when exercising independently       Able to check pulse independently Yes       Intervention Provide education and demonstration on how to check pulse in carotid and radial arteries.;Review the importance of being able to check your own pulse for safety during independent exercise       Expected  Outcomes Short Term: Able to explain why pulse checking is important during independent exercise;Long Term: Able to check pulse independently and accurately       Understanding of Exercise Prescription Yes       Intervention Provide education, explanation, and written materials on patient's individual exercise prescription       Expected Outcomes Short Term: Able to explain program exercise prescription;Long Term: Able to explain home exercise prescription to exercise independently                Exercise Goals Re-Evaluation :   Discharge Exercise Prescription (Final Exercise Prescription Changes):  Exercise Prescription Changes - 06/26/22 1500       Response to Exercise   Blood Pressure (Admit) 130/74    Blood Pressure (Exercise) 170/80    Blood Pressure (Exit) 130/70    Heart Rate (Admit) 78 bpm    Heart Rate (Exercise) 120 bpm    Heart Rate (Exit) 88 bpm    Oxygen Saturation (Admit) 97 %    Oxygen Saturation (Exercise) 97 %    Oxygen Saturation (Exit) 98 %    Rating of Perceived Exertion (Exercise) 14    Perceived Dyspnea (Exercise) 0    Symptoms fatigue    Comments 6 MWT results             Nutrition:  Target Goals: Understanding of nutrition guidelines, daily intake of sodium '1500mg'$ , cholesterol '200mg'$ , calories 30% from fat and 7% or less from saturated fats, daily to have 5 or more servings of fruits and  vegetables.  Education: All About Nutrition: -Group instruction provided by verbal, written material, interactive activities, discussions, models, and posters to present general guidelines for heart healthy nutrition including fat, fiber, MyPlate, the role of sodium in heart healthy nutrition, utilization of the nutrition label, and utilization of this knowledge for meal planning. Follow up email sent as well. Written material given at graduation. Flowsheet Row Cardiac Rehab from 07/05/2022 in [facility] Cardiac and Pulmonary Rehab  Date 06/28/22  Educator [facility]  Instruction Review Code 1- Verbalizes Understanding       Biometrics:  Pre Biometrics - 06/26/22 1511       Pre Biometrics   Height 5' (1.524 m)    Weight 141 lb 9.6 oz (64.2 kg)    BMI (Calculated) 27.65    Single Leg Stand 1.15 seconds              Nutrition Therapy Plan and Nutrition Goals:  Nutrition Therapy & Goals - 06/28/22 1553       Nutrition Therapy   Diet Heart healthy, low Na    Protein (specify units) 75g    Fiber 20 grams    Whole Grain Foods 1 servings   ideal 3, but high fiber foods tend to bother her GI   Saturated Fats 12 max. grams    Fruits and Vegetables 5 servings/day   ideal 8, but high fiber foods tend to bother her GI   Sodium 2 grams      Personal Nutrition Goals   Nutrition Goal ST: add protein to salad for lunch; leftover meat/fish/poultry from night before, boiled eggs, beans/lentils, nuts/seeds LT: meet protein needs, improve energy    Comments 86 y.o. F admitted to cardiac rehab for s/p coronary artery stent placement. PMHx includes HTN, CAD, GERD, hypothyroidism, CKD, HLD, prediabetes, anemia, and vit D deficiency. Relevant medications inlcudes synthroid, robaxin, omeprazole, crestor, tumeric 500 mg, vit D3. PYP Score: Vegetables & Fruits 6/12. Breads, Grains &  Cereals 6/12. Red & Processed Meat 9/12. Poultry 2/2. Fish & Shellfish 0/4. Beans, Nuts & Seeds 1/4. Milk & Dairy Foods 1/6. Lashon  reports that since living with her daughter, she has learned a lot about nutrition - her daughter reads food labels, does not cook with salt, limits saturated fat, includes a variety of vegetables and protein with cooking, and uses olive oil when cooking. Dustina reports eating a little debbie apple pie with diet coke every morning for breakfast, but is not interested in changing that - encouraged her to maybe add some protein to that with greek yogurt, cottage cheese, or nuts/seeds (she does not like peanut butter). For lunch she will choose ham on white bread or more often than not, a salad with lots of vegetables - she switches up the leafy greens she uses as well as the vegetables she adds on top, sometimes she will include eggs or ham; encouraged her to add protein to salad for lunch such as leftover meat/fish/poultry from night before, boiled eggs, beans/lentils, nuts/seeds. She reports that her daughter cooks dinner which is her biggest meal and will normally be a lean source of protein and a variety of vegetables. She rpeorts loving berries and fruit as well as beans and regularly includes them in her diet. Shamiya drinks propel water and 4 diet cokes during the day - she feels she is drinking a lot of water and is not interested in cutting down on the soda she is drinking; she usually has four bottles of propel. She reports that since her hernia surgery she has had difficulty with larger amounts of fiber and she prefers white bread for her sandwiches. Discussed heart healthy eating and ensuring she is meeting her protein needs by having a good source of protein at eat meal. Discussed heart healthy eating.      Intervention Plan   Intervention Prescribe, educate and counsel regarding individualized specific dietary modifications aiming towards targeted core components such as weight, hypertension, lipid management, diabetes, heart failure and other comorbidities.    Expected Outcomes Short Term Goal:  Understand basic principles of dietary content, such as calories, fat, sodium, cholesterol and nutrients.;Short Term Goal: A plan has been developed with personal nutrition goals set during dietitian appointment.;Long Term Goal: Adherence to prescribed nutrition plan.             Nutrition Assessments:  MEDIFICTS Score Key: ?70 Need to make dietary changes  40-70 Heart Healthy Diet ? 40 Therapeutic Level Cholesterol Diet  Flowsheet Row Cardiac Rehab from 06/26/2022 in [facility] Cardiac and Pulmonary Rehab  Picture Your Plate Total Score on Admission 64      Picture Your Plate Scores: <40 Unhealthy dietary pattern with much room for improvement. 41-50 Dietary pattern unlikely to meet recommendations for good health and room for improvement. 51-60 More healthful dietary pattern, with some room for improvement.  >60 Healthy dietary pattern, although there may be some specific behaviors that could be improved.    Nutrition Goals Re-Evaluation:   Nutrition Goals Discharge (Final Nutrition Goals Re-Evaluation):   Psychosocial: Target Goals: Acknowledge presence or absence of significant depression and/or stress, maximize coping skills, provide positive support system. Participant is able to verbalize types and ability to use techniques and skills needed for reducing stress and depression.   Education: Stress, Anxiety, and Depression - Group verbal and visual presentation to define topics covered.  Reviews how body is impacted by stress, anxiety, and depression.  Also discusses healthy ways to reduce stress and  to treat/manage anxiety and depression.  Written material given at graduation.   Education: Sleep Hygiene -Provides group verbal and written instruction about how sleep can affect your health.  Define sleep hygiene, discuss sleep cycles and impact of sleep habits. Review good sleep hygiene tips.    Initial Review & Psychosocial Screening:  Initial Psych Review & Screening -  06/19/22 1429       Initial Review   Current issues with None Identified      Family Dynamics   Good Support System? Yes   daughter     Barriers   Psychosocial barriers to participate in program There are no identifiable barriers or psychosocial needs.      Screening Interventions   Interventions Encouraged to exercise;To provide support and resources with identified psychosocial needs;Provide feedback about the scores to participant    Expected Outcomes Short Term goal: Utilizing psychosocial counselor, staff and physician to assist with identification of specific Stressors or current issues interfering with healing process. Setting desired goal for each stressor or current issue identified.;Long Term Goal: Stressors or current issues are controlled or eliminated.;Short Term goal: Identification and review with participant of any Quality of Life or Depression concerns found by scoring the questionnaire.;Long Term goal: The participant improves quality of Life and PHQ9 Scores as seen by post scores and/or verbalization of changes             Quality of Life Scores:   Quality of Life - 06/26/22 1513       Quality of Life   Select Quality of Life      Quality of Life Scores   Health/Function Pre 25.8 %    Socioeconomic Pre 24.56 %    Psych/Spiritual Pre 25.71 %    Family Pre 22.2 %    GLOBAL Pre 24.99 %            Scores of 19 and below usually indicate a poorer quality of life in these areas.  A difference of  2-3 points is a clinically meaningful difference.  A difference of 2-3 points in the total score of the Quality of Life Index has been associated with significant improvement in overall quality of life, self-image, physical symptoms, and general health in studies assessing change in quality of life.  PHQ-9: Review Flowsheet  More data exists      06/26/2022 05/08/2022 04/28/2022 03/27/2022 02/21/2022  Depression screen PHQ 2/9  Decreased Interest 0 0 0 0 0  Down,  Depressed, Hopeless 0 0 0 0 0  PHQ - 2 Score 0 0 0 0 0  Altered sleeping 0 0 - 0 0  Tired, decreased energy 2 0 - 0 0  Change in appetite 0 0 - 0 0  Feeling bad or failure about yourself  0 0 - 0 0  Trouble concentrating 0 0 - 0 0  Moving slowly or fidgety/restless 0 0 - 0 0  Suicidal thoughts 0 0 - 0 0  PHQ-9 Score 2 0 - 0 0  Difficult doing work/chores Somewhat difficult Not difficult at all - Not difficult at all -   Interpretation of Total Score  Total Score Depression Severity:  1-4 = Minimal depression, 5-9 = Mild depression, 10-14 = Moderate depression, 15-19 = Moderately severe depression, 20-27 = Severe depression   Psychosocial Evaluation and Intervention:  Psychosocial Evaluation - 06/19/22 1440       Psychosocial Evaluation & Interventions   Interventions Encouraged to exercise with the program and follow exercise prescription  Comments Alaynah has no barriers to attending the program. She has great support from her daughter. She lives with her daughter.  She gets out for meals with friends and attends church. She wants to continue to improve with her shortness of breath symptoms. The symptoms have improved since her stent.  She is ready to get started and see if her symptoms improve even more.    Expected Outcomes STG Derinda attends all scheduled sessions she sees improvement with her symptoms by dischargeLTG Shyrl continues with her exercise progression and symptom improvement.    Continue Psychosocial Services  Follow up required by staff             Psychosocial Re-Evaluation:   Psychosocial Discharge (Final Psychosocial Re-Evaluation):   Vocational Rehabilitation: Provide vocational rehab assistance to qualifying candidates.   Vocational Rehab Evaluation & Intervention:   Education: Education Goals: Education classes will be provided on a variety of topics geared toward better understanding of heart health and risk factor modification. Participant will  state understanding/return demonstration of topics presented as noted by education test scores.  Learning Barriers/Preferences:   General Cardiac Education Topics:  AED/CPR: - Group verbal and written instruction with the use of models to demonstrate the basic use of the AED with the basic ABC's of resuscitation.   Anatomy and Cardiac Procedures: - Group verbal and visual presentation and models provide information about basic cardiac anatomy and function. Reviews the testing methods done to diagnose heart disease and the outcomes of the test results. Describes the treatment choices: Medical Management, Angioplasty, or Coronary Bypass Surgery for treating various heart conditions including Myocardial Infarction, Angina, Valve Disease, and Cardiac Arrhythmias.  Written material given at graduation.   Medication Safety: - Group verbal and visual instruction to review commonly prescribed medications for heart and lung disease. Reviews the medication, class of the drug, and side effects. Includes the steps to properly store meds and maintain the prescription regimen.  Written material given at graduation. Flowsheet Row Cardiac Rehab from 07/05/2022 in [facility] Cardiac and Pulmonary Rehab  Date 07/05/22  Educator SB  Instruction Review Code 1- Verbalizes Understanding       Intimacy: - Group verbal instruction through game format to discuss how heart and lung disease can affect sexual intimacy. Written material given at graduation..   Know Your Numbers and Heart Failure: - Group verbal and visual instruction to discuss disease risk factors for cardiac and pulmonary disease and treatment options.  Reviews associated critical values for Overweight/Obesity, Hypertension, Cholesterol, and Diabetes.  Discusses basics of heart failure: signs/symptoms and treatments.  Introduces Heart Failure Zone chart for action plan for heart failure.  Written material given at graduation.   Infection  Prevention: - Provides verbal and written material to individual with discussion of infection control including proper hand washing and proper equipment cleaning during exercise session. Flowsheet Row Cardiac Rehab from 07/05/2022 in [facility] Cardiac and Pulmonary Rehab  Date 06/26/22  Educator [facility]  Instruction Review Code 1- Verbalizes Understanding       Falls Prevention: - Provides verbal and written material to individual with discussion of falls prevention and safety. Flowsheet Row Cardiac Rehab from 07/05/2022 in [facility] Cardiac and Pulmonary Rehab  Date 06/26/22  Educator [facility]  Instruction Review Code 1- Verbalizes Understanding       Other: -Provides group and verbal instruction on various topics (see comments)   Knowledge Questionnaire Score:  Knowledge Questionnaire Score - 06/26/22 1514       Knowledge Questionnaire Score  Pre Score 25/26             Core Components/Risk Factors/Patient Goals at Admission:  Personal Goals and Risk Factors at Admission - 06/26/22 1515       Core Components/Risk Factors/Patient Goals on Admission    Weight Management Yes;Weight Maintenance    Intervention Weight Management: Develop a combined nutrition and exercise program designed to reach desired caloric intake, while maintaining appropriate intake of nutrient and fiber, sodium and fats, and appropriate energy expenditure required for the weight goal.;Weight Management: Provide education and appropriate resources to help participant work on and attain dietary goals.;Weight Management/Obesity: Establish reasonable short term and long term weight goals.    Admit Weight 141 lb 3.2 oz (64 kg)    Goal Weight: Short Term 140 lb (63.5 kg)    Goal Weight: Long Term 140 lb (63.5 kg)    Expected Outcomes Short Term: Continue to assess and modify interventions until short term weight is achieved;Long Term: Adherence to nutrition and physical activity/exercise program aimed toward attainment of  established weight goal;Weight Maintenance: Understanding of the daily nutrition guidelines, which includes 25-35% calories from fat, 7% or less cal from saturated fats, less than '200mg'$  cholesterol, less than 1.5gm of sodium, & 5 or more servings of fruits and vegetables daily;Understanding recommendations for meals to include 15-35% energy as protein, 25-35% energy from fat, 35-60% energy from carbohydrates, less than '200mg'$  of dietary cholesterol, 20-35 gm of total fiber daily;Understanding of distribution of calorie intake throughout the day with the consumption of 4-5 meals/snacks    Hypertension Yes    Intervention Provide education on lifestyle modifcations including regular physical activity/exercise, weight management, moderate sodium restriction and increased consumption of fresh fruit, vegetables, and low fat dairy, alcohol moderation, and smoking cessation.;Monitor prescription use compliance.    Expected Outcomes Short Term: Continued assessment and intervention until BP is < 140/61m HG in hypertensive participants. < 130/844mHG in hypertensive participants with diabetes, heart failure or chronic kidney disease.;Long Term: Maintenance of blood pressure at goal levels.    Lipids Yes    Intervention Provide education and support for participant on nutrition & aerobic/resistive exercise along with prescribed medications to achieve LDL '70mg'$ , HDL >'40mg'$ .    Expected Outcomes Short Term: Participant states understanding of desired cholesterol values and is compliant with medications prescribed. Participant is following exercise prescription and nutrition guidelines.;Long Term: Cholesterol controlled with medications as prescribed, with individualized exercise RX and with personalized nutrition plan. Value goals: LDL < '70mg'$ , HDL > 40 mg.             Education:Diabetes - Individual verbal and written instruction to review signs/symptoms of diabetes, desired ranges of glucose level fasting, after  meals and with exercise. Acknowledge that pre and post exercise glucose checks will be done for 3 sessions at entry of program.   Core Components/Risk Factors/Patient Goals Review:    Core Components/Risk Factors/Patient Goals at Discharge (Final Review):    ITP Comments:  ITP Comments     Row Name 06/19/22 1458 06/26/22 1503 07/12/22 0738       ITP Comments Virtual orientation call completed today. shehas an appointment on Date: 06/26/2022  for EP eval and gym Orientation.  [facility] . Completed 6MWT and gym orientation. Initial ITP created and sent for review to Dr. MaEmily FilbertMedical Director. 30 Day review completed. Medical Director ITP review done, changes made as directed, and signed approval by Medical Director.  Comments:

## 2022-07-13 DIAGNOSIS — Z955 Presence of coronary angioplasty implant and graft: Secondary | ICD-10-CM | POA: Diagnosis not present

## 2022-07-13 DIAGNOSIS — I208 Other forms of angina pectoris: Secondary | ICD-10-CM | POA: Diagnosis not present

## 2022-07-13 DIAGNOSIS — Z48812 Encounter for surgical aftercare following surgery on the circulatory system: Secondary | ICD-10-CM | POA: Diagnosis not present

## 2022-07-13 DIAGNOSIS — R7309 Other abnormal glucose: Secondary | ICD-10-CM | POA: Diagnosis not present

## 2022-07-13 LAB — GLUCOSE, CAPILLARY: Glucose-Capillary: 156 mg/dL — ABNORMAL HIGH (ref 70–99)

## 2022-07-13 NOTE — Progress Notes (Signed)
Daily Session Note  Patient Details  Name: PAULENA SERVAIS MRN: [id number] Date of Birth: 06/17/1936 Referring Provider:   Flowsheet Row Cardiac Rehab from 06/26/2022 in [facility] Cardiac and Pulmonary Rehab  Referring Provider Fletcher Anon       Encounter Date: 07/13/2022  Check In:  Session Check In - 07/13/22 1410       Check-In   Supervising physician immediately available to respond to emergencies See telemetry face sheet for immediately available ER MD    Location [facility]-Cardiac & Pulmonary Rehab    Staff Present Vida Rigger, RN, BSN;Jessica Raoul, MA, RCEP, CCRP, Marylynn Pearson, MS, ASCM CEP, Exercise Physiologist    Virtual Visit No    Medication changes reported     No    Fall or balance concerns reported    No    Tobacco Cessation No Change    Warm-up and Cool-down Performed on first and last piece of equipment    Resistance Training Performed Yes    VAD Patient? No    PAD/SET Patient? No      Pain Assessment   Currently in Pain? No/denies                Social History   Tobacco Use  Smoking Status Never  Smokeless Tobacco Never    Goals Met:  Independence with exercise equipment Exercise tolerated well No report of concerns or symptoms today Strength training completed today  Goals Unmet:  Not Applicable  Comments: Pt able to follow exercise prescription today without complaint.  Will continue to monitor for progression.   Dr. Emily Filbert is Medical Director for [place].  Dr. Ottie Glazier is Medical Director for [facility] Pulmonary Rehabilitation.

## 2022-07-17 ENCOUNTER — Encounter: Payer: Self-pay | Admitting: Family Medicine

## 2022-07-17 ENCOUNTER — Ambulatory Visit (INDEPENDENT_AMBULATORY_CARE_PROVIDER_SITE_OTHER): Payer: Medicare Other | Admitting: Family Medicine

## 2022-07-17 VITALS — BP 130/80 | HR 71 | Resp 16 | Wt 138.0 lb

## 2022-07-17 DIAGNOSIS — E039 Hypothyroidism, unspecified: Secondary | ICD-10-CM

## 2022-07-17 DIAGNOSIS — J4520 Mild intermittent asthma, uncomplicated: Secondary | ICD-10-CM

## 2022-07-17 DIAGNOSIS — I2510 Atherosclerotic heart disease of native coronary artery without angina pectoris: Secondary | ICD-10-CM | POA: Diagnosis not present

## 2022-07-17 DIAGNOSIS — I10 Essential (primary) hypertension: Secondary | ICD-10-CM

## 2022-07-17 DIAGNOSIS — F430 Acute stress reaction: Secondary | ICD-10-CM | POA: Diagnosis not present

## 2022-07-17 DIAGNOSIS — D508 Other iron deficiency anemias: Secondary | ICD-10-CM

## 2022-07-17 DIAGNOSIS — R7303 Prediabetes: Secondary | ICD-10-CM | POA: Diagnosis not present
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
## 2022-07-17 NOTE — Assessment & Plan Note (Signed)
Chronic, well controlled. No changes.

## 2022-07-17 NOTE — Assessment & Plan Note (Signed)
Recheck labs and adjust as indicated.
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
## 2022-07-17 NOTE — Assessment & Plan Note (Signed)
With improved DOE since recent stent placement. Continue current med regimen and follow with Cardiology, has appt upcoming.

## 2022-07-17 NOTE — Assessment & Plan Note (Signed)
At goal. No changes.  

## 2022-07-18 LAB — LIPID PANEL
Chol/HDL Ratio: 2.3 ratio (ref 0.0–4.4)
Cholesterol, Total: 160 mg/dL (ref 100–199)
HDL: 69 mg/dL (ref >39)
LDL Chol Calc (NIH): 70 mg/dL (ref 0–99)
Triglycerides: 122 mg/dL (ref 0–149)
VLDL Cholesterol Cal: 21 mg/dL (ref 5–40)

## 2022-07-18 LAB — CBC WITH DIFFERENTIAL/PLATELET
Basophils Absolute: 0 10*3/uL (ref 0.0–0.2)
Basos: 1 %
EOS (ABSOLUTE): 0.2 10*3/uL (ref 0.0–0.4)
Eos: 3 %
Hematocrit: 35.7 % (ref 34.0–46.6)
Hemoglobin: 11.7 g/dL (ref 11.1–15.9)
Immature Grans (Abs): 0.1 10*3/uL (ref 0.0–0.1)
Immature Granulocytes: 1 %
Lymphocytes Absolute: 1 10*3/uL (ref 0.7–3.1)
Lymphs: 13 %
MCH: 29.8 pg (ref 26.6–33.0)
MCHC: 32.8 g/dL (ref 31.5–35.7)
MCV: 91 fL (ref 79–97)
Monocytes Absolute: 0.7 10*3/uL (ref 0.1–0.9)
Monocytes: 9 %
Neutrophils Absolute: 5.9 10*3/uL (ref 1.4–7.0)
Neutrophils: 73 %
Platelets: 314 10*3/uL (ref 150–450)
RBC: 3.92 x10E6/uL (ref 3.77–5.28)
RDW: 13.2 % (ref 11.7–15.4)
WBC: 7.9 10*3/uL (ref 3.4–10.8)

## 2022-07-18 LAB — BASIC METABOLIC PANEL
BUN/Creatinine Ratio: 17 (ref 12–28)
BUN: 22 mg/dL (ref 8–27)
CO2: 16 mmol/L — ABNORMAL LOW (ref 20–29)
Calcium: 9.9 mg/dL (ref 8.7–10.3)
Chloride: 108 mmol/L — ABNORMAL HIGH (ref 96–106)
Creatinine, Ser: 1.33 mg/dL — ABNORMAL HIGH (ref 0.57–1.00)
Glucose: 119 mg/dL — ABNORMAL HIGH (ref 70–99)
Potassium: 4.3 mmol/L (ref 3.5–5.2)
Sodium: 140 mmol/L (ref 134–144)
eGFR: 40 mL/min/{1.73_m2} — ABNORMAL LOW (ref >59)

## 2022-07-18 LAB — HEMOGLOBIN A1C
Est. average glucose Bld gHb Est-mCnc: 126 mg/dL
Hgb A1c MFr Bld: 6 % — ABNORMAL HIGH (ref 4.8–5.6)

## 2022-07-18 LAB — FERRITIN: Ferritin: 87 ng/mL (ref 15–150)

## 2022-07-18 LAB — IRON AND TIBC
Iron Saturation: 23 % (ref 15–55)
Iron: 93 ug/dL (ref 27–139)
Total Iron Binding Capacity: 408 ug/dL (ref 250–450)
UIBC: 315 ug/dL (ref 118–369)

## 2022-07-18 LAB — VITAMIN B12: Vitamin B-12: 712 pg/mL (ref 232–1245)

## 2022-07-18 LAB — FOLATE: Folate: >20 ng/mL (ref >3.0)

## 2022-07-18 LAB — TSH: TSH: 2.22 u[IU]/mL (ref 0.450–4.500)

## 2022-07-19 ENCOUNTER — Encounter: Payer: Medicare Other | Admitting: *Deleted

## 2022-07-19 DIAGNOSIS — Z48812 Encounter for surgical aftercare following surgery on the circulatory system: Secondary | ICD-10-CM | POA: Diagnosis not present

## 2022-07-19 DIAGNOSIS — Z955 Presence of coronary angioplasty implant and graft: Secondary | ICD-10-CM

## 2022-07-19 DIAGNOSIS — R7309 Other abnormal glucose: Secondary | ICD-10-CM | POA: Diagnosis not present

## 2022-07-19 DIAGNOSIS — I208 Other forms of angina pectoris: Secondary | ICD-10-CM | POA: Diagnosis not present

## 2022-07-19 NOTE — Progress Notes (Signed)
Daily Session Note  Patient Details  Name: Andrea Hart MRN: [id number] Date of Birth: 1936/06/21 Referring Provider:   Flowsheet Row Cardiac Rehab from 06/26/2022 in [facility] Cardiac and Pulmonary Rehab  Referring Provider Fletcher Anon       Encounter Date: 07/19/2022  Check In:  Session Check In - 07/19/22 1401       Check-In   Supervising physician immediately available to respond to emergencies See telemetry face sheet for immediately available ER MD    Location [facility]-Cardiac & Pulmonary Rehab    Staff Present Renita Papa, RN BSN;Melissa [place], RDN, Tawanna Solo, MS, ASCM CEP, Exercise Physiologist    Virtual Visit No    Medication changes reported     No    Fall or balance concerns reported    No    Warm-up and Cool-down Performed on first and last piece of equipment    Resistance Training Performed Yes    VAD Patient? No    PAD/SET Patient? No      Pain Assessment   Currently in Pain? No/denies                Social History   Tobacco Use  Smoking Status Never  Smokeless Tobacco Never    Goals Met:  Independence with exercise equipment Exercise tolerated well No report of concerns or symptoms today Strength training completed today  Goals Unmet:  Not Applicable  Comments: Pt able to follow exercise prescription today without complaint.  Will continue to monitor for progression.    Dr. Emily Filbert is Medical Director for [place].  Dr. Ottie Glazier is Medical Director for [facility] Pulmonary Rehabilitation.

## 2022-07-24 ENCOUNTER — Encounter: Payer: Self-pay | Admitting: Family Medicine

## 2022-07-24 ENCOUNTER — Ambulatory Visit (INDEPENDENT_AMBULATORY_CARE_PROVIDER_SITE_OTHER): Payer: Medicare Other | Admitting: Family Medicine

## 2022-07-24 ENCOUNTER — Encounter: Payer: Medicare Other | Admitting: *Deleted

## 2022-07-24 VITALS — BP 130/76 | HR 78 | Ht 62.0 in | Wt 141.8 lb

## 2022-07-24 DIAGNOSIS — Z955 Presence of coronary angioplasty implant and graft: Secondary | ICD-10-CM

## 2022-07-24 DIAGNOSIS — M62838 Other muscle spasm: Secondary | ICD-10-CM

## 2022-07-24 DIAGNOSIS — I208 Other forms of angina pectoris: Secondary | ICD-10-CM | POA: Diagnosis not present

## 2022-07-24 DIAGNOSIS — R7309 Other abnormal glucose: Secondary | ICD-10-CM | POA: Diagnosis not present

## 2022-07-24 DIAGNOSIS — M19012 Primary osteoarthritis, left shoulder: Secondary | ICD-10-CM

## 2022-07-24 DIAGNOSIS — Z48812 Encounter for surgical aftercare following surgery on the circulatory system: Secondary | ICD-10-CM | POA: Diagnosis not present

## 2022-07-24 MED ORDER — METHOCARBAMOL 500 MG PO TABS: 500.0000 mg | ORAL_TABLET | Freq: Every evening | ORAL | 0 refills | Status: DC | PRN

## 2022-07-24 NOTE — Progress Notes (Signed)
Daily Session Note  Patient Details  Name: Andrea Hart MRN: [id number] Date of Birth: Jan 21, 1936 Referring Provider:   Flowsheet Row Cardiac Rehab from 06/26/2022 in [facility] Cardiac and Pulmonary Rehab  Referring Provider Fletcher Anon       Encounter Date: 07/24/2022  Check In:  Session Check In - 07/24/22 1413       Check-In   Supervising physician immediately available to respond to emergencies See telemetry face sheet for immediately available ER MD    Location [facility]-Cardiac & Pulmonary Rehab    Staff Present Renita Papa, RN BSN;Joseph Tessie Fass, RCP,RRT,BSRT;Melissa [place], [place], LDN    Virtual Visit No    Medication changes reported     No    Fall or balance concerns reported    No    Warm-up and Cool-down Performed on first and last piece of equipment    Resistance Training Performed Yes    VAD Patient? No    PAD/SET Patient? No      Pain Assessment   Currently in Pain? No/denies                Social History   Tobacco Use  Smoking Status Never  Smokeless Tobacco Never    Goals Met:  Independence with exercise equipment Exercise tolerated well No report of concerns or symptoms today Strength training completed today  Goals Unmet:  Not Applicable  Comments: Pt able to follow exercise prescription today without complaint.  Will continue to monitor for progression.    Dr. Emily Filbert is Medical Director for [place].  Dr. Ottie Glazier is Medical Director for [facility] Pulmonary Rehabilitation.

## 2022-07-24 NOTE — Assessment & Plan Note (Signed)
Presents for follow-up to chronic left shoulder pain in the setting of severe glenohumeral OA, tolerated intra-articular cortisone injection last month. Feels that this has waned, exam reveals stable ROM and symptoms primarily localized to the left upper trapezius. I have advised formal PT for optimization of her left shoulder symptoms and function, additionally methocarbamol to be rewritten with PRN dosing guidelines. If pain at the shoulder or paraspinal musculature prevents PT, she was advised to contact us to coordinate repeat injection(s). Patient and her daughter voice understanding and are amenable to plan.

## 2022-07-24 NOTE — Patient Instructions (Signed)
   [facility]:  [place]: [phone]

## 2022-07-24 NOTE — Assessment & Plan Note (Signed)
Again noted paraspinal left cervical spasm with focality to the upper trapezius, this is in the setting of comorbid left glenohumeral OA with chronic symptoms. She describes positive response from Robaxin, can continue this but advised modification from her nightly dosing to PRN dosing. We will also start formal left shoulder PT as she is amenable to this. Can coordinate follow-up if pain prevents advance with PT.

## 2022-07-24 NOTE — Progress Notes (Signed)
Daily Session Note  Patient Details  Name: Andrea Hart MRN: [id number] Date of Birth: 02/23/1936 Referring Provider:   Flowsheet Row Cardiac Rehab from 06/26/2022 in [facility] Cardiac and Pulmonary Rehab  Referring Provider Arida       Encounter Date: 07/24/2022  Check In:  Session Check In - 07/24/22 1413       Check-In   Supervising physician immediately available to respond to emergencies See telemetry face sheet for immediately available ER MD    Location [facility]-Cardiac & Pulmonary Rehab    Staff Present Meredith Craven, RN BSN;Joseph Hood, RCP,RRT,BSRT;Melissa Cai[place], RDN, LDN    Virtual Visit No    Medication changes reported     No    Fall or balance concerns reported    No    Warm-up and Cool-down Performed on first and last piece of equipment    Resistance Training Performed Yes    VAD Patient? No    PAD/SET Patient? No      Pain Assessment   Currently in Pain? No/denies                Social History   Tobacco Use  Smoking Status Never  Smokeless Tobacco Never    Goals Met:  Independence with exercise equipment Exercise tolerated well No report of concerns or symptoms today Strength training completed today  Goals Unmet:  Not Applicable  Comments: Pt able to follow exercise prescription today without complaint.  Will continue to monitor for progression.    Dr. Mark Miller is Medical Director for [facility].  Dr. Fuad Aleskerov is Medical Director for [facility] Pulmonary Rehabilitation. 

## 2022-07-24 NOTE — Progress Notes (Signed)
     Primary Care / Sports Medicine Office Visit  Patient Information:  Patient ID: Andrea Hart, female DOB: Jun 19, 1936 Age: 86 y.o. MRN: [id number]   Andrea Hart is a pleasant 86 y.o. female presenting with the following:  Chief Complaint  Patient presents with   Shoulder Pain    Pt is going to PT for 3 days a week, is lifting weights is having struggle with left arm.     Vitals:   07/24/22 1049  BP: 130/76  Pulse: 78  SpO2: 98%   Vitals:   07/24/22 1049  Weight: 141 lb 12.8 oz (64.3 kg)  Height: '5\' 2"'$  (1.575 m)   Body mass index is 25.94 kg/m.  No results found.   Independent interpretation of notes and tests performed by another provider:   None  Procedures performed:   None  Pertinent History, Exam, Impression, and Recommendations:   Problem List Items Addressed This Visit       Musculoskeletal and Integument   Osteoarthritis of glenohumeral joint, left - Primary    Presents for follow-up to chronic left shoulder pain in the setting of severe glenohumeral OA, tolerated intra-articular cortisone injection last month. Feels that this has waned, exam reveals stable ROM and symptoms primarily localized to the left upper trapezius. I have advised formal PT for optimization of her left shoulder symptoms and function, additionally methocarbamol to be rewritten with PRN dosing guidelines. If pain at the shoulder or paraspinal musculature prevents PT, she was advised to contact us to coordinate repeat injection(s). Patient and her daughter voice understanding and are amenable to plan.      Relevant Medications   methocarbamol (ROBAXIN) 500 MG tablet   Other Relevant Orders   Ambulatory referral to Physical Therapy     Other   Cervical paraspinal muscle spasm    Again noted paraspinal left cervical spasm with focality to the upper trapezius, this is in the setting of comorbid left glenohumeral OA with chronic symptoms. She describes positive response from Robaxin,  can continue this but advised modification from her nightly dosing to PRN dosing. We will also start formal left shoulder PT as she is amenable to this. Can coordinate follow-up if pain prevents advance with PT.      Relevant Medications   methocarbamol (ROBAXIN) 500 MG tablet   Other Relevant Orders   Ambulatory referral to Physical Therapy     Orders & Medications Meds ordered this encounter  Medications   methocarbamol (ROBAXIN) 500 MG tablet    Sig: Take 1 tablet (500 mg total) by mouth at bedtime as needed for muscle spasms.    Dispense:  90 tablet    Refill:  0   Orders Placed This Encounter  Procedures   Ambulatory referral to Physical Therapy     No follow-ups on file.     Montel Culver, MD   Primary Care Sports Medicine [place]

## 2022-07-26 ENCOUNTER — Encounter: Payer: Medicare Other | Admitting: *Deleted

## 2022-07-26 DIAGNOSIS — R7309 Other abnormal glucose: Secondary | ICD-10-CM | POA: Diagnosis not present

## 2022-07-26 DIAGNOSIS — Z955 Presence of coronary angioplasty implant and graft: Secondary | ICD-10-CM

## 2022-07-26 DIAGNOSIS — Z48812 Encounter for surgical aftercare following surgery on the circulatory system: Secondary | ICD-10-CM | POA: Diagnosis not present

## 2022-07-26 DIAGNOSIS — I208 Other forms of angina pectoris: Secondary | ICD-10-CM | POA: Diagnosis not present

## 2022-07-26 NOTE — Progress Notes (Signed)
Daily Session Note  Patient Details  Name: Andrea Hart MRN: [id number] Date of Birth: 12-09-1935 Referring Provider:   Flowsheet Row Cardiac Rehab from 06/26/2022 in [facility] Cardiac and Pulmonary Rehab  Referring Provider Fletcher Anon       Encounter Date: 07/26/2022  Check In:  Session Check In - 07/26/22 1400       Check-In   Supervising physician immediately available to respond to emergencies See telemetry face sheet for immediately available ER MD    Location [facility]-Cardiac & Pulmonary Rehab    Staff Present Hope Budds, RDN, LDN;Joseph Tessie Fass, Cristopher Estimable, RN BSN    Virtual Visit No    Medication changes reported     No    Fall or balance concerns reported    No    Warm-up and Cool-down Performed on first and last piece of equipment    Resistance Training Performed Yes    VAD Patient? No    PAD/SET Patient? No      Pain Assessment   Currently in Pain? No/denies                Social History   Tobacco Use  Smoking Status Never  Smokeless Tobacco Never    Goals Met:  Independence with exercise equipment Exercise tolerated well No report of concerns or symptoms today Strength training completed today  Goals Unmet:  Not Applicable  Comments: Pt able to follow exercise prescription today without complaint.  Will continue to monitor for progression.    Dr. Emily Filbert is Medical Director for [place].  Dr. Ottie Glazier is Medical Director for [facility] Pulmonary Rehabilitation.

## 2022-07-27 ENCOUNTER — Encounter: Payer: Medicare Other | Admitting: *Deleted

## 2022-07-27 DIAGNOSIS — R7309 Other abnormal glucose: Secondary | ICD-10-CM | POA: Diagnosis not present

## 2022-07-27 DIAGNOSIS — Z48812 Encounter for surgical aftercare following surgery on the circulatory system: Secondary | ICD-10-CM | POA: Diagnosis not present

## 2022-07-27 DIAGNOSIS — Z955 Presence of coronary angioplasty implant and graft: Secondary | ICD-10-CM | POA: Diagnosis not present

## 2022-07-27 DIAGNOSIS — I208 Other forms of angina pectoris: Secondary | ICD-10-CM | POA: Diagnosis not present

## 2022-07-27 NOTE — Progress Notes (Signed)
Daily Session Note  Patient Details  Name: Andrea Hart MRN: [id number] Date of Birth: 11-21-1936 Referring Provider:   Flowsheet Row Cardiac Rehab from 06/26/2022 in [facility] Cardiac and Pulmonary Rehab  Referring Provider Fletcher Anon       Encounter Date: 07/27/2022  Check In:  Session Check In - 07/27/22 1359       Check-In   Supervising physician immediately available to respond to emergencies See telemetry face sheet for immediately available ER MD    Location [facility]-Cardiac & Pulmonary Rehab    Staff Present Renita Papa, RN BSN;Noah Tickle, BS, Exercise Physiologist;Laureen Owens Shark, BS, RRT, CPFT    Virtual Visit No    Medication changes reported     No    Warm-up and Cool-down Performed on first and last piece of equipment    Resistance Training Performed Yes    VAD Patient? No    PAD/SET Patient? No      Pain Assessment   Currently in Pain? No/denies                Social History   Tobacco Use  Smoking Status Never  Smokeless Tobacco Never    Goals Met:  Independence with exercise equipment Exercise tolerated well No report of concerns or symptoms today Strength training completed today  Goals Unmet:  Not Applicable  Comments: Pt able to follow exercise prescription today without complaint.  Will continue to monitor for progression.    Dr. Emily Filbert is Medical Director for [place].  Dr. Ottie Glazier is Medical Director for [facility] Pulmonary Rehabilitation.

## 2022-07-31 ENCOUNTER — Encounter: Payer: Medicare Other | Admitting: *Deleted

## 2022-07-31 DIAGNOSIS — R7309 Other abnormal glucose: Secondary | ICD-10-CM | POA: Diagnosis not present

## 2022-07-31 DIAGNOSIS — Z48812 Encounter for surgical aftercare following surgery on the circulatory system: Secondary | ICD-10-CM | POA: Diagnosis not present

## 2022-07-31 DIAGNOSIS — I208 Other forms of angina pectoris: Secondary | ICD-10-CM | POA: Diagnosis not present

## 2022-07-31 DIAGNOSIS — Z955 Presence of coronary angioplasty implant and graft: Secondary | ICD-10-CM

## 2022-07-31 NOTE — Progress Notes (Signed)
Daily Session Note  Patient Details  Name: Andrea Hart MRN: [id number] Date of Birth: June 03, 1936 Referring Provider:   Flowsheet Row Cardiac Rehab from 06/26/2022 in [facility] Cardiac and Pulmonary Rehab  Referring Provider Arida       Encounter Date: 07/31/2022  Check In:  Session Check In - 07/31/22 1412       Check-In   Supervising physician immediately available to respond to emergencies See telemetry face sheet for immediately available ER MD    Location [facility]-Cardiac & Pulmonary Rehab    Staff Present Renita Papa, RN BSN;Joseph Tessie Fass, RCP,RRT,BSRT;Melissa [place], [place], LDN    Virtual Visit No    Medication changes reported     No    Fall or balance concerns reported    No    Warm-up and Cool-down Performed on first and last piece of equipment    Resistance Training Performed Yes    VAD Patient? No    PAD/SET Patient? No      Pain Assessment   Currently in Pain? No/denies                Social History   Tobacco Use  Smoking Status Never  Smokeless Tobacco Never    Goals Met:  Independence with exercise equipment Exercise tolerated well No report of concerns or symptoms today Strength training completed today  Goals Unmet:  Not Applicable  Comments: Pt able to follow exercise prescription today without complaint.  Will continue to monitor for progression.    Dr. Emily Filbert is Medical Director for [place].  Dr. Ottie Glazier is Medical Director for [facility] Pulmonary Rehabilitation.

## 2022-08-02 ENCOUNTER — Encounter: Payer: Medicare Other | Admitting: *Deleted

## 2022-08-02 DIAGNOSIS — Z955 Presence of coronary angioplasty implant and graft: Secondary | ICD-10-CM

## 2022-08-02 DIAGNOSIS — R7309 Other abnormal glucose: Secondary | ICD-10-CM | POA: Diagnosis not present

## 2022-08-02 DIAGNOSIS — I208 Other forms of angina pectoris: Secondary | ICD-10-CM | POA: Diagnosis not present

## 2022-08-02 DIAGNOSIS — Z48812 Encounter for surgical aftercare following surgery on the circulatory system: Secondary | ICD-10-CM | POA: Diagnosis not present

## 2022-08-02 NOTE — Progress Notes (Signed)
Daily Session Note  Patient Details  Name: Andrea Hart MRN: [id number] Date of Birth: [birth year] Referring Provider:   Flowsheet Row Cardiac Rehab from 06/26/2022 in [facility] Cardiac and Pulmonary Rehab  Referring Provider Fletcher Anon       Encounter Date: 08/02/2022  Check In:  Session Check In - 08/02/22 1354       Check-In   Supervising physician immediately available to respond to emergencies See telemetry face sheet for immediately available ER MD    Location [facility]-Cardiac & Pulmonary Rehab    Staff Present Coralie Keens, MS, ASCM CEP, Exercise Physiologist;Jessica Luan Pulling, MA, RCEP, CCRP, CCET;Jocelyne Reinertsen Sherryll Burger, RN BSN    Virtual Visit No    Medication changes reported     No    Fall or balance concerns reported    No    Warm-up and Cool-down Performed on first and last piece of equipment    Resistance Training Performed Yes    VAD Patient? No    PAD/SET Patient? No      Pain Assessment   Currently in Pain? No/denies                Social History   Tobacco Use  Smoking Status Never  Smokeless Tobacco Never    Goals Met:  Independence with exercise equipment Exercise tolerated well No report of concerns or symptoms today Strength training completed today  Goals Unmet:  Not Applicable  Comments: Pt able to follow exercise prescription today without complaint.  Will continue to monitor for progression.    Dr. Emily Filbert is Medical Director for [facility].  Dr. Ottie Glazier is Medical Director for [facility] Pulmonary Rehabilitation.

## 2022-08-03 ENCOUNTER — Encounter: Payer: Medicare Other | Admitting: *Deleted

## 2022-08-03 DIAGNOSIS — Z955 Presence of coronary angioplasty implant and graft: Secondary | ICD-10-CM

## 2022-08-03 DIAGNOSIS — R7309 Other abnormal glucose: Secondary | ICD-10-CM | POA: Diagnosis not present

## 2022-08-03 DIAGNOSIS — I208 Other forms of angina pectoris: Secondary | ICD-10-CM | POA: Diagnosis not present

## 2022-08-03 DIAGNOSIS — Z48812 Encounter for surgical aftercare following surgery on the circulatory system: Secondary | ICD-10-CM | POA: Diagnosis not present

## 2022-08-03 NOTE — Progress Notes (Signed)
Daily Session Note  Patient Details  Name: Andrea Hart MRN: [id number] Date of Birth: [birth year] Referring Provider:   Flowsheet Row Cardiac Rehab from 06/26/2022 in [facility] Cardiac and Pulmonary Rehab  Referring Provider Fletcher Anon       Encounter Date: 08/03/2022  Check In:  Session Check In - 08/03/22 1408       Check-In   Supervising physician immediately available to respond to emergencies See telemetry face sheet for immediately available ER MD    Location [facility]-Cardiac & Pulmonary Rehab    Staff Present Renita Papa, RN BSN;Joseph [place], RCP,RRT,BSRT;Jessica [place], [place], RCEP, CCRP, CCET    Virtual Visit No    Medication changes reported     No    Fall or balance concerns reported    No    Warm-up and Cool-down Performed on first and last piece of equipment    Resistance Training Performed Yes    VAD Patient? No    PAD/SET Patient? No      Pain Assessment   Currently in Pain? No/denies                Social History   Tobacco Use  Smoking Status Never  Smokeless Tobacco Never    Goals Met:  Independence with exercise equipment Exercise tolerated well No report of concerns or symptoms today Strength training completed today  Goals Unmet:  Not Applicable  Comments: Pt able to follow exercise prescription today without complaint.  Will continue to monitor for progression.    Dr. Emily Filbert is Medical Director for [facility].  Dr. Ottie Glazier is Medical Director for [facility] Pulmonary Rehabilitation.

## 2022-08-04 ENCOUNTER — Ambulatory Visit: Payer: Medicare Other | Admitting: Nurse Practitioner

## 2022-08-08 ENCOUNTER — Encounter: Payer: Self-pay | Admitting: Physician Assistant

## 2022-08-08 ENCOUNTER — Ambulatory Visit: Payer: Medicare Other | Attending: Physician Assistant | Admitting: Physician Assistant

## 2022-08-08 VITALS — BP 140/70 | HR 71 | Ht 60.0 in | Wt 142.0 lb

## 2022-08-08 DIAGNOSIS — Z8719 Personal history of other diseases of the digestive system: Secondary | ICD-10-CM

## 2022-08-08 DIAGNOSIS — R001 Bradycardia, unspecified: Secondary | ICD-10-CM

## 2022-08-08 DIAGNOSIS — I10 Essential (primary) hypertension: Secondary | ICD-10-CM

## 2022-08-08 DIAGNOSIS — N1832 Chronic kidney disease, stage 3b: Secondary | ICD-10-CM

## 2022-08-08 DIAGNOSIS — I6523 Occlusion and stenosis of bilateral carotid arteries: Secondary | ICD-10-CM

## 2022-08-08 DIAGNOSIS — E785 Hyperlipidemia, unspecified: Secondary | ICD-10-CM

## 2022-08-08 DIAGNOSIS — Z8673 Personal history of transient ischemic attack (TIA), and cerebral infarction without residual deficits: Secondary | ICD-10-CM

## 2022-08-08 DIAGNOSIS — I493 Ventricular premature depolarization: Secondary | ICD-10-CM | POA: Diagnosis not present

## 2022-08-08 DIAGNOSIS — I2510 Atherosclerotic heart disease of native coronary artery without angina pectoris: Secondary | ICD-10-CM

## 2022-08-08 DIAGNOSIS — I200 Unstable angina: Secondary | ICD-10-CM | POA: Diagnosis not present

## 2022-08-08 NOTE — Patient Instructions (Signed)
Medication Instructions:  Your physician recommends that you continue on your current medications as directed. Please refer to the Current Medication list given to you today.  *If you need a refill on your cardiac medications before your next appointment, please call your pharmacy*   Lab Work: None  If you have labs (blood work) drawn today and your tests are completely normal, you will receive your results only by: [place] (if you have MyChart) OR A paper copy in the mail If you have any lab test that is abnormal or we need to change your treatment, we will call you to review the results.   Testing/Procedures: None   Follow-Up: At [facility], you and your health needs are our priority.  As part of our continuing mission to provide you with exceptional heart care, we have created designated Provider Care Teams.  These Care Teams include your primary Cardiologist (physician) and Advanced Practice Providers (APPs -  Physician Assistants and Nurse Practitioners) who all work together to provide you with the care you need, when you need it.   Your next appointment:   3 month(s)  The format for your next appointment:   In Person  Provider:   Kathlyn Sacramento, MD or Christell Faith, PA-C        Important Information About [place]

## 2022-08-08 NOTE — Progress Notes (Signed)
Cardiology Office Note    Date:  08/08/2022   ID:  Andrea Hart, DOB 04-03-1936, MRN [id number]  PCP:  Virginia Crews, MD  Cardiologist:  Kathlyn Sacramento, MD  Electrophysiologist:  None   Chief Complaint: Follow-up  History of Present Illness:   Andrea Hart is a 86 y.o. female with history of CAD status post stenting in 2013 to the RCA status post PCI/DES to the RCA overlapping the previously placed stent on 05/24/2022, CKD stage III, prior CVA x2, HTN, HLD, GI bleeding in 02/2021, PVCs, hypothyroidism, and asthma who presents for follow-up of CAD.   She underwent stenting of an unknown vessel in [place], [place] in 2013.  In 2017, she suffered a stroke.  ILR did not show any evidence of A-fib, and she has been maintained on aspirin and clopidogrel since.  In 01/2016, she complained of presyncope and underwent event monitoring.  This showed frequent PVCs with a 10% burden.  She reported a long history of PVCs and has been managed with beta-blocker therapy.  She has history of difficult to control hypertension with renal artery ultrasound in 07/2020 showing no evidence of RAS.  Carotid artery ultrasound in 11/2020 showed moderate, nonobstructive bilateral left greater than right ICA disease.  She was admitted in 02/2021 with rectal bleeding with GI feeling the presentation was most consistent with an acute diverticular bleed.  Aspirin and clopidogrel were held and she underwent outpatient colonoscopy in 04/2021 which demonstrated nonbleeding internal hemorrhoids, moderate diverticulosis without evidence of diverticular bleeding.  She was subsequently resumed on aspirin and clopidogrel.  She was seen in the office in 02/2022 and was without symptoms of angina or decompensation.  She remained active with multiple social events.  Most recent carotid artery ultrasound from 02/2022 demonstrated 1 to 39% RICA stenosis and 60 to 55% LICA stenosis (previously 50 to 69%).  She was seen on 05/16/2022 noting a 9 to  32-monthhistory of exertional dyspnea that had become more pronounced.  Symptoms felt similar, though not as severe, to her prior angina leading up to PCI in 2013.  She was without frank chest pain.  Given concerning symptoms, she underwent LHC on 05/24/2022 which showed moderately to severely calcified coronary arteries with significant one-vessel CAD.  There was 95% stenosis in the proximal/mid RCA just before the previously placed stent.  LVEDP was upper limit of normal.  She underwent successful PCI/DES to the RCA overlapping with the previously placed stent.  Post-cath labs notable for a hemoglobin of 9.4.  Carvedilol was decreased secondary to bradycardia.  At time of discharge, no significant arteriotomy site complications.  She was last seen in the office on 05/30/2022 and was without symptoms of angina or decompensation.  Since undergoing PCI to the RCA, she noted improvement in her dyspnea.  She was frustrated as she continued to be fatigued.  Bradycardia improved following the tapering of carvedilol.  Echo on 06/22/2022 demonstrated an EF of 60 to 65%, no regional wall motion abnormalities, moderate LVH, grade 1 diastolic dysfunction, normal RV systolic function and ventricular cavity size, mildly dilated left atrium, mild mitral regurgitation, aortic valve sclerosis without evidence of stenosis, and an estimated right atrial pressure of 3 mmHg.  She comes in doing very well from a cardiac perspective, and is without symptoms of angina or decompensation.  Since her last visit, and following cardiac rehab, she has noted an improvement in her overall functional status and fatigue.  With this, she is very pleased.  No symptoms of dyspnea, dizziness, presyncope, or syncope.  No falls or symptoms concerning for bleeding.  She is adherent and tolerating cardiac medications without issues.  Overall, she is very pleased with her progress and does not have any active cardiac issues or concerns at this time.      Labs independently reviewed: 07/2022 - A1c 6.0, TC 160, TG 122, HDL 69, LDL 70, Hgb 11.7, PLT 314, TSH normal, BUN 22, serum creatinine 1.33, potassium 4.3 01/2022 - albumin 4.4, AST/ALT normal 07/2021 - TC 137, TG 128, HDL 50, LDL 64    Past Medical History:  Diagnosis Date   Abdominal hernia    Arthritis    Asthma    Carotid arterial disease ([place])    a. 11/2020 Carotid U/S: Less than 68% RICA, 12-75% LICA.   CKD (chronic kidney disease), stage III ([place])    Coronary artery disease    a. 2013 s/p PCI and stent placement in [facility] done for stable angina.   GI bleeding 02/02/2021   Hyperlipidemia    Hypertension    Hypothyroidism    PVC's (premature ventricular contractions)    a. 01/2020 [place]io: RSR, 70, frequent PVCs with 10.5% burden.   Stroke [facility])    a. 2017 - ILR did not show afib.    Past Surgical History:  Procedure Laterality Date   ABDOMINAL HYSTERECTOMY     APPENDECTOMY     CATARACT EXTRACTION Bilateral    CHOLECYSTECTOMY     COLONOSCOPY WITH PROPOFOL N/A 04/06/2021   Procedure: COLONOSCOPY WITH PROPOFOL;  Surgeon: Toledo, Benay Pike, MD;  Location: [facility] ENDOSCOPY;  Service: Gastroenterology;  Laterality: N/A;   CORONARY ANGIOPLASTY  2013   1xStent [facility] [place].   CORONARY STENT INTERVENTION N/A 05/24/2022   Procedure: CORONARY STENT INTERVENTION;  Surgeon: Wellington Hampshire, MD;  Location: [place] CV LAB;  Service: Cardiovascular;  Laterality: N/A;   EYE SURGERY     HAND SURGERY Right    LEFT HEART CATH AND CORONARY ANGIOGRAPHY Left 05/24/2022   Procedure: LEFT HEART CATH AND CORONARY ANGIOGRAPHY;  Surgeon: Wellington Hampshire, MD;  Location: [place] CV LAB;  Service: Cardiovascular;  Laterality: Left;   LOOP RECORDER IMPLANT     NISSEN FUNDOPLICATION  1700   REPLACEMENT TOTAL KNEE Bilateral 2017   TOTAL HIP ARTHROPLASTY Right    TOTAL VAGINAL HYSTERECTOMY      Current Medications: Current Meds  Medication Sig   albuterol  (VENTOLIN HFA) 108 (90 Base) MCG/ACT inhaler Inhale 2 puffs into the lungs every 4 (four) hours as needed for wheezing or shortness of breath.   amLODipine (NORVASC) 10 MG tablet TAKE 1 TABLET BY MOUTH EVERY DAY   aspirin EC 81 MG tablet Take 81 mg by mouth daily.   budesonide-formoterol (SYMBICORT) 160-4.5 MCG/ACT inhaler Inhale 2 puffs into the lungs 2 (two) times daily.   cetirizine (ZYRTEC) 10 MG tablet Take 1 tablet (10 mg total) by mouth daily.   clopidogrel (PLAVIX) 75 MG tablet Take 1 tablet (75 mg total) by mouth daily.   fluticasone (FLONASE) 50 MCG/ACT nasal spray Place 2 sprays into both nostrils daily.   levothyroxine (SYNTHROID) 75 MCG tablet Take 1 tablet (75 mcg total) by mouth daily before breakfast.   meclizine (ANTIVERT) 25 MG tablet Take 0.5 tablets (12.5 mg total) by mouth 3 (three) times daily as needed for dizziness or nausea.   methocarbamol (ROBAXIN) 500 MG tablet Take 1 tablet (500 mg total) by mouth at bedtime as  needed for muscle spasms.   montelukast (SINGULAIR) 10 MG tablet Take 1 tablet (10 mg total) by mouth at bedtime.   omeprazole (PRILOSEC) 40 MG capsule Take 1 capsule (40 mg total) by mouth 2 (two) times daily.   rosuvastatin (CRESTOR) 20 MG tablet TAKE 1 TABLET BY MOUTH EVERY DAY   spironolactone (ALDACTONE) 25 MG tablet Take 0.5 tablets (12.5 mg total) by mouth daily.   spironolactone (ALDACTONE) 25 MG tablet Take 12.5 mg by mouth daily.   Turmeric 500 MG TABS Take 1,000 mg by mouth daily.   valsartan (DIOVAN) 320 MG tablet Take 1 tablet (320 mg total) by mouth daily.    Allergies:   Patient has no active allergies.   Social History   Socioeconomic History   Marital status: Widowed    Spouse name: Not on file   Number of children: 2   Years of education: Not on file   Highest education level: Some college, no degree  Occupational History    Comment: retired Science writer  Tobacco Use   Smoking status: Never   Smokeless tobacco: Never  Vaping Use    Vaping Use: Never used  Substance and Sexual Activity   Alcohol use: Not Currently    Comment: Occasional   Drug use: Never   Sexual activity: Never    Birth control/protection: Surgical  Other Topics Concern   Not on file  Social History Narrative   Moved here in 2019, had been living in [place].    Born here, raised daughters here.    Lives with daughter now, [place].   Lost one daughter in car wreck.    Social Determinants of Health   Financial Resource Strain: Low Risk  (11/29/2021)   Overall Financial Resource Strain (CARDIA)    Difficulty of Paying Living Expenses: Not hard at all  Food Insecurity: No Food Insecurity (11/29/2021)   Hunger Vital Sign    Worried About Running Out of Food in the Last Year: Never true    Ran Out of Food in the Last Year: Never true  Transportation Needs: No Transportation Needs (11/29/2021)   PRAPARE - Hydrologist (Medical): No    Lack of Transportation (Non-Medical): No  Physical Activity: Insufficiently Active (11/29/2021)   Exercise Vital Sign    Days of Exercise per Week: 2 days    Minutes of Exercise per Session: 20 min  Stress: No Stress Concern Present (11/29/2021)   [place]    Feeling of Stress : Not at all  Social Connections: Moderately Integrated (11/29/2021)   Social Connection and Isolation Panel [NHANES]    Frequency of Communication with Friends and Family: Three times a week    Frequency of Social Gatherings with Friends and Family: Twice a week    Attends Religious Services: More than 4 times per year    Active Member of [employer] or Organizations: Yes    Attends Archivist Meetings: More than 4 times per year    Marital Status: Widowed     Family History:  The patient's family history includes Heart Problems in her brother, father, and mother; Heart attack in her brother and mother. There is no history of Colon  cancer or Breast cancer.  ROS:   12-point review of systems is negative unless otherwise noted in the HPI.   EKGs/Labs/Other Studies Reviewed:    Studies reviewed were summarized above. The additional studies were reviewed today:  2D echo  06/22/2022: 1. Left ventricular ejection fraction, by estimation, is 60 to 65%. The  left ventricle has normal function. The left ventricle has no regional  wall motion abnormalities. There is moderate left ventricular hypertrophy.  Left ventricular diastolic  parameters are consistent with Grade I diastolic dysfunction (impaired  relaxation).   2. Right ventricular systolic function is normal. The right ventricular  size is normal.   3. Left atrial size was mildly dilated.   4. The mitral valve is normal in structure. Mild mitral valve  regurgitation. No evidence of mitral stenosis.   5. The aortic valve is normal in structure. Aortic valve regurgitation is  not visualized. Aortic valve sclerosis/calcification is present, without  any evidence of aortic stenosis.   6. The inferior vena cava is normal in size with greater than 50%  respiratory variability, suggesting right atrial pressure of 3 mmHg. __________  LHC 05/24/2022:   Mid RCA lesion is 10% stenosed.   Prox RCA to Mid RCA lesion is 95% stenosed.   Prox LAD to Mid LAD lesion is 20% stenosed.   A drug-eluting stent was successfully placed using a STENT ONYX FRONTIER 3.0X18.   Post intervention, there is a 0% residual stenosis.   1.  Moderately to severely calcified coronary arteries with significant one-vessel coronary artery disease.   There is 95% stenosis in the proximal/mid right coronary artery just before the previously placed stent.   2.  Left ventricular angiography was not performed due to chronic kidney disease.  LVEDP was at the upper limit of normal. 3.  Successful angioplasty and drug-eluting stent placement to the right coronary artery overlapping with the previously placed  stent. 4.  Difficult catheterization via the right radial artery due to significant tortuosity of the innominate and right subclavian arteries.  Consider alternative route in the future.   Recommendations: Dual antiplatelet therapy for at least 6 months. Aggressive treatment of risk factors. __________   Carotid artery ultrasound 02/23/2022: Summary:  Right Carotid: Velocities in the right ICA are consistent with a 1-39%  stenosis. Non-hemodynamically significant plaque <50% noted in the  CCA. The ECA appears <50% stenosed.   Left Carotid: Velocities in the left ICA are consistent with a 60-79%  stenosis. Non-hemodynamically significant plaque <50% noted in the  CCA. The ECA appears >50% stenosed.      The bilateral lobes of the thyroid had a globuated  appearance   Suggest follow up study in 12 months.  __________   Renal artery ultrasound 07/16/2020: Summary:  Largest Aortic Diameter: 2.0 cm     Renal:     Right: Normal size right kidney. Normal right Resisitive Index.         Normal cortical thickness of right kidney. No evidence of         right renal artery stenosis. RRV flow present.  Left:  LRV flow present. No evidence of left renal artery stenosis.         Normal size of left kidney. Normal left Resistive Index.         Normal cortical thickness of the left kidney. __________   Outpatient cardiac monitoring 01/2020: Normal sinus rhythm with an average heart rate of 70 bpm. 1 short run of SVT lasted 4 beats. Frequent PVCs with a burden of 10.5%.  Ventricular bigeminy and trigeminy were present.   EKG:  EKG is ordered today.  The EKG ordered today demonstrates NSR, 71 bpm, occasional PVCs, LVH, no acute ST-T changes  Recent Labs: 01/23/2022: ALT  10 07/17/2022: BUN 22; Creatinine, Ser 1.33; Hemoglobin 11.7; Platelets 314; Potassium 4.3; Sodium 140; TSH 2.220  Recent Lipid Panel    Component Value Date/Time   CHOL 160 07/17/2022 0903   TRIG 122 07/17/2022 0903    HDL 69 07/17/2022 0903   CHOLHDL 2.3 07/17/2022 0903   CHOLHDL 3 12/19/2019 0957   VLDL 36.4 12/19/2019 0957   LDLCALC 70 07/17/2022 0903    PHYSICAL EXAM:    VS:  BP (!) 140/70 (BP Location: Left Arm, Patient Position: Sitting, Cuff Size: Normal) Comment: After ekg  Pulse 71   Ht 5' (1.524 m)   Wt 142 lb (64.4 kg)   SpO2 94%   BMI 27.73 kg/m   BMI: Body mass index is 27.73 kg/m.  Physical Exam Vitals reviewed.  Constitutional:      Appearance: She is well-developed.  HENT:     Head: Normocephalic and atraumatic.  Eyes:     General:        Right eye: No discharge.        Left eye: No discharge.  Neck:     Vascular: No JVD.  Cardiovascular:     Rate and Rhythm: Normal rate and regular rhythm.     Pulses:          Carotid pulses are  on the left side with bruit.      Posterior tibial pulses are 2+ on the right side and 2+ on the left side.     Heart sounds: Normal heart sounds, S1 normal and S2 normal. Heart sounds not distant. No midsystolic click and no opening snap. No murmur heard.    No friction rub.  Pulmonary:     Effort: Pulmonary effort is normal. No respiratory distress.     Breath sounds: Normal breath sounds. No decreased breath sounds, wheezing or rales.  Chest:     Chest wall: No tenderness.  Abdominal:     General: There is no distension.  Musculoskeletal:     Cervical back: Normal range of motion.     Right lower leg: No edema.     Left lower leg: No edema.  Skin:    General: Skin is warm and dry.     Nails: There is no clubbing.  Neurological:     Mental Status: She is alert and oriented to person, place, and time.  Psychiatric:        Speech: Speech normal.        Behavior: Behavior normal.        Thought Content: Thought content normal.        Judgment: Judgment normal.     Wt Readings from Last 3 Encounters:  08/08/22 142 lb (64.4 kg)  07/24/22 141 lb 12.8 oz (64.3 kg)  07/17/22 138 lb (62.6 kg)     ASSESSMENT & PLAN:   CAD  involving the native coronary arteries without angina: She is doing very well without any symptoms concerning for angina or decompensation.  Since undergoing PCI to the RCA, she has noted an improvement in her dyspnea.  Following enrollment with cardiac rehab, she has also noted an improvement in her overall functional status/fatigue.  Continue secondary prevention and aggressive risk factor modification including DAPT with aspirin and clopidogrel without interruption for a minimum of 6 months dating back to date of PCI along with amlodipine, carvedilol, and rosuvastatin.  No indication for further ischemic testing at this time.  HTN: Blood pressure is mildly elevated in the office today with a  repeat blood pressure 140/70.  Typically, blood pressure is well controlled.  No changes were made to medical therapy at this time.  Continue to monitor.  HLD: LDL 64.  She remains on rosuvastatin.  PVCs: She does have a couple PVCs on twelve-lead EKG today.  Asymptomatic.  She remains on low-dose carvedilol.  Prior bradycardia precludes titration.  Sinus bradycardia: Improved following tapering dose of carvedilol.  Carotid artery disease: Most recent carotid artery ultrasound from 02/2022 demonstrated progression of LICA stenosis of 60 to 79% (previously 50 to 69%).  She remains on medical therapy as outlined above.  Follow-up imaging in 02/2023.  History of CVA: No new deficits.  She remains on aspirin, clopidogrel, and rosuvastatin.  History of GI bleed/iron deficiency anemia: Hgb stable on recent check.  CKD stage III: Stable on recent check.    Disposition: F/u with Dr. Fletcher Anon or an APP in 3 months.   Medication Adjustments/Labs and Tests Ordered: Current medicines are reviewed at length with the patient today.  Concerns regarding medicines are outlined above. Medication changes, Labs and Tests ordered today are summarized above and listed in the Patient Instructions accessible in Encounters.    Signed, Christell Faith, PA-C 08/08/2022 9:45 AM     [address] [place], [place] [address] [phone]

## 2022-08-09 ENCOUNTER — Encounter: Payer: Self-pay | Admitting: *Deleted

## 2022-08-09 ENCOUNTER — Encounter: Payer: Medicare Other | Attending: Cardiovascular Disease | Admitting: *Deleted

## 2022-08-09 DIAGNOSIS — I208 Other forms of angina pectoris: Secondary | ICD-10-CM | POA: Diagnosis not present

## 2022-08-09 DIAGNOSIS — Z955 Presence of coronary angioplasty implant and graft: Secondary | ICD-10-CM | POA: Insufficient documentation

## 2022-08-09 NOTE — Progress Notes (Signed)
Daily Session Note  Patient Details  Name: Andrea Hart MRN: [id number] Date of Birth: 05-07-1936 Referring Provider:   Flowsheet Row Cardiac Rehab from 06/26/2022 in [facility] Cardiac and Pulmonary Rehab  Referring Provider Fletcher Anon       Encounter Date: 08/09/2022  Check In:  Session Check In - 08/09/22 1400       Check-In   Supervising physician immediately available to respond to emergencies See telemetry face sheet for immediately available ER MD    Location [facility]-Cardiac & Pulmonary Rehab    Staff Present Renita Papa, RN BSN;Joseph [place], RCP,RRT,BSRT;Laureen [place], [place], RRT, CPFT    Virtual Visit No    Medication changes reported     No    Fall or balance concerns reported    No    Warm-up and Cool-down Performed on first and last piece of equipment    Resistance Training Performed Yes    VAD Patient? No    PAD/SET Patient? No      Pain Assessment   Currently in Pain? No/denies                Social History   Tobacco Use  Smoking Status Never  Smokeless Tobacco Never    Goals Met:  Independence with exercise equipment Exercise tolerated well No report of concerns or symptoms today Strength training completed today  Goals Unmet:  Not Applicable  Comments: Pt able to follow exercise prescription today without complaint.  Will continue to monitor for progression.    Dr. Emily Filbert is Medical Director for [place].  Dr. Ottie Glazier is Medical Director for [facility] Pulmonary Rehabilitation.

## 2022-08-09 NOTE — Progress Notes (Signed)
Cardiac Individual Treatment Plan  Patient Details  Name: Andrea Hart MRN: [id number] Date of Birth: [birth year] Referring Provider:   Flowsheet Row Cardiac Rehab from 06/26/2022 in [facility] Cardiac and Pulmonary Rehab  Referring Provider Arida       Initial Encounter Date:  Flowsheet Row Cardiac Rehab from 06/26/2022 in [facility] Cardiac and Pulmonary Rehab  Date 06/26/22       Visit Diagnosis: Status post coronary artery stent placement  Patient's Home Medications on Admission:  Current Outpatient Medications:    albuterol (VENTOLIN HFA) 108 (90 Base) MCG/ACT inhaler, Inhale 2 puffs into the lungs every 4 (four) hours as needed for wheezing or shortness of breath., Disp: 6.7 g, Rfl: 11   amLODipine (NORVASC) 10 MG tablet, TAKE 1 TABLET BY MOUTH EVERY DAY, Disp: 90 tablet, Rfl: 1   aspirin EC 81 MG tablet, Take 81 mg by mouth daily., Disp: , Rfl:    budesonide-formoterol (SYMBICORT) 160-4.5 MCG/ACT inhaler, Inhale 2 puffs into the lungs 2 (two) times daily., Disp: 3 each, Rfl: 3   cetirizine (ZYRTEC) 10 MG tablet, Take 1 tablet (10 mg total) by mouth daily., Disp: 90 tablet, Rfl: 3   clopidogrel (PLAVIX) 75 MG tablet, Take 1 tablet (75 mg total) by mouth daily., Disp: 30 tablet, Rfl: 5   fluticasone (FLONASE) 50 MCG/ACT nasal spray, Place 2 sprays into both nostrils daily., Disp: 16 g, Rfl: 6   levothyroxine (SYNTHROID) 75 MCG tablet, Take 1 tablet (75 mcg total) by mouth daily before breakfast., Disp: 30 tablet, Rfl: 5   meclizine (ANTIVERT) 25 MG tablet, Take 0.5 tablets (12.5 mg total) by mouth 3 (three) times daily as needed for dizziness or nausea., Disp: 30 tablet, Rfl: 1   methocarbamol (ROBAXIN) 500 MG tablet, Take 1 tablet (500 mg total) by mouth at bedtime as needed for muscle spasms., Disp: 90 tablet, Rfl: 0   montelukast (SINGULAIR) 10 MG tablet, Take 1 tablet (10 mg total) by mouth at bedtime., Disp: 30 tablet, Rfl: 5   omeprazole (PRILOSEC) 40 MG capsule, Take 1 capsule (40 mg  total) by mouth 2 (two) times daily., Disp: 60 capsule, Rfl: 5   rosuvastatin (CRESTOR) 20 MG tablet, TAKE 1 TABLET BY MOUTH EVERY DAY, Disp: 90 tablet, Rfl: 1   spironolactone (ALDACTONE) 25 MG tablet, Take 0.5 tablets (12.5 mg total) by mouth daily., Disp: 15 tablet, Rfl: 5   spironolactone (ALDACTONE) 25 MG tablet, Take 12.5 mg by mouth daily., Disp: , Rfl:    Turmeric 500 MG TABS, Take 1,000 mg by mouth daily., Disp: , Rfl:    valsartan (DIOVAN) 320 MG tablet, Take 1 tablet (320 mg total) by mouth daily., Disp: 30 tablet, Rfl: 5  Past Medical History: Past Medical History:  Diagnosis Date   Abdominal hernia    Arthritis    Asthma    Carotid arterial disease ([place])    a. 11/2020 Carotid U/S: Less than 70% RICA, 48-88% LICA.   CKD (chronic kidney disease), stage III ([place])    Coronary artery disease    a. 2013 s/p PCI and stent placement in [facility] done for stable angina.   GI bleeding 02/02/2021   Hyperlipidemia    Hypertension    Hypothyroidism    PVC's (premature ventricular contractions)    a. 01/2020 Zio: RSR, 70, frequent PVCs with 10.5% burden.   Stroke [facility])    a. 2017 - ILR did not show afib.    Tobacco Use: Social History   Tobacco Use  Smoking Status Never  Smokeless Tobacco Never    Labs: Review Flowsheet  More data exists      Latest Ref Rng & Units 12/19/2019 08/16/2020 07/21/2021 01/23/2022 07/17/2022  Labs for ITP Cardiac and Pulmonary Rehab  Cholestrol 100 - 199 mg/dL 183  145  137  - 160   LDL (calc) 0 - 99 mg/dL 87  65  64  - 70   HDL-C >39 mg/dL 59.60  60  50  - 69   Trlycerides 0 - 149 mg/dL 182.0  111  128  - 122   Hemoglobin A1c 4.8 - 5.6 % - - 6.3  6.0  6.0      Exercise Target Goals: Exercise Program Goal: Individual exercise prescription set using results from initial 6 min walk test and THRR while considering  patient's activity barriers and safety.   Exercise Prescription Goal: Initial exercise prescription builds to 30-45  minutes a day of aerobic activity, 2-3 days per week.  Home exercise guidelines will be given to patient during program as part of exercise prescription that the participant will acknowledge.   Education: Aerobic Exercise: - Group verbal and visual presentation on the components of exercise prescription. Introduces F.I.T.T principle from ACSM for exercise prescriptions.  Reviews F.I.T.T. principles of aerobic exercise including progression. Written material given at graduation. Flowsheet Row Cardiac Rehab from 08/02/2022 in [facility] Cardiac and Pulmonary Rehab  Education need identified 06/26/22       Education: Resistance Exercise: - Group verbal and visual presentation on the components of exercise prescription. Introduces F.I.T.T principle from ACSM for exercise prescriptions  Reviews F.I.T.T. principles of resistance exercise including progression. Written material given at graduation.    Education: Exercise & Equipment Safety: - Individual verbal instruction and demonstration of equipment use and safety with use of the equipment. Flowsheet Row Cardiac Rehab from 08/02/2022 in [facility] Cardiac and Pulmonary Rehab  Date 06/26/22  Educator [facility]  Instruction Review Code 1- Verbalizes Understanding       Education: Exercise Physiology & General Exercise Guidelines: - Group verbal and written instruction with models to review the exercise physiology of the cardiovascular system and associated critical values. Provides general exercise guidelines with specific guidelines to those with heart or lung disease.  Flowsheet Row Cardiac Rehab from 08/02/2022 in [facility] Cardiac and Pulmonary Rehab  Date 08/02/22  Educator kl  Instruction Review Code 1- [employer] Understanding       Education: Flexibility, Balance, Mind/Body Relaxation: - Group verbal and visual presentation with interactive activity on the components of exercise prescription. Introduces F.I.T.T principle from ACSM for exercise  prescriptions. Reviews F.I.T.T. principles of flexibility and balance exercise training including progression. Also discusses the mind body connection.  Reviews various relaxation techniques to help reduce and manage stress (i.e. Deep breathing, progressive muscle relaxation, and visualization). Balance handout provided to take home. Written material given at graduation.   Activity Barriers & Risk Stratification:  Activity Barriers & Cardiac Risk Stratification - 06/19/22 1428       Activity Barriers & Cardiac Risk Stratification   Activity Barriers Right Knee Replacement;Left Knee Replacement;Right Hip Replacement    Cardiac Risk Stratification Moderate             6 Minute Walk:  6 Minute Walk     Row Name 06/26/22 1504         6 Minute Walk   Phase Initial     Distance 615 feet     Walk Time 4 minutes     #  of Rest Breaks 2     MPH 1.75     METS 1.39     RPE 14     Perceived Dyspnea  0     VO2 Peak 4.86     Symptoms Yes (comment)     Comments leg fatigue     Resting HR 78 bpm     Resting BP 130/74     Resting Oxygen Saturation  97 %     Exercise Oxygen Saturation  during 6 min walk 97 %     Max Ex. HR 720 bpm     Max Ex. BP 170/80     2 Minute Post BP 130/70              Oxygen Initial Assessment:   Oxygen Re-Evaluation:   Oxygen Discharge (Final Oxygen Re-Evaluation):   Initial Exercise Prescription:  Initial Exercise Prescription - 06/26/22 1500       Date of Initial Exercise RX and Referring Provider   Date 06/26/22    Referring Provider Arida      Oxygen   Maintain Oxygen Saturation 88% or higher      Treadmill   MPH 0.8    Grade 0    Minutes 15    METs 1.6      Recumbant Bike   Level 1    RPM 50    Minutes 15    METs 1.39      NuStep   Level 1    SPM 80    Minutes 15    METs 1.39      T5 Nustep   Level 1    SPM 80    Minutes 15    METs 1.39      Biostep-RELP   Level 1    SPM 50    Minutes 15    METs 1.39       Track   Laps 5    Minutes 15    METs 1.39      Prescription Details   Frequency (times per week) 3    Duration Progress to 30 minutes of continuous aerobic without signs/symptoms of physical distress      Intensity   THRR 40-80% of Max Heartrate 101-125    Ratings of Perceived Exertion 11-13    Perceived Dyspnea 0-4      Progression   Progression Continue to progress workloads to maintain intensity without signs/symptoms of physical distress.      Resistance Training   Training Prescription Yes    Weight 2 on left/ROM on right    Reps 10-15             Perform Capillary Blood Glucose checks as needed.  Exercise Prescription Changes:   Exercise Prescription Changes     Row Name 06/26/22 1500 07/24/22 1500 08/01/22 1400         Response to Exercise   Blood Pressure (Admit) 130/74 -- 126/62     Blood Pressure (Exercise) 170/80 -- 148/68     Blood Pressure (Exit) 130/70 -- 118/60     Heart Rate (Admit) 78 bpm -- 82 bpm     Heart Rate (Exercise) 120 bpm -- 120 bpm     Heart Rate (Exit) 88 bpm -- 81 bpm     Oxygen Saturation (Admit) 97 % -- --     Oxygen Saturation (Exercise) 97 % -- --     Oxygen Saturation (Exit) 98 % -- --     Rating of  Perceived Exertion (Exercise) 14 -- 13     Perceived Dyspnea (Exercise) 0 -- --     Symptoms fatigue -- none     Comments 6 MWT results -- --     Duration -- -- Continue with 30 min of aerobic exercise without signs/symptoms of physical distress.     Intensity -- -- THRR unchanged       Progression   Progression -- -- Continue to progress workloads to maintain intensity without signs/symptoms of physical distress.     Average METs -- -- 2.19       Resistance Training   Training Prescription -- -- Yes     Weight -- -- 2 lb on left, 1 lb on right     Reps -- -- 10-15       Interval Training   Interval Training -- -- No       Treadmill   MPH -- -- 1.3     Grade -- -- 0.5     Minutes -- -- 15     METs -- -- 2.08        Recumbant Bike   Level -- -- 2     Minutes -- -- 15     METs -- -- 2.5       NuStep   Level -- -- 3     Minutes -- -- 15     METs -- -- 2.1       T5 Nustep   Level -- -- 1     Minutes -- -- 15     METs -- -- 1.7       Home Exercise Plan   Plans to continue exercise at -- Home (comment)  walking, staff videos Home (comment)  walking, staff videos     Frequency -- Add 2 additional days to program exercise sessions.  start with 1 Add 2 additional days to program exercise sessions.  start with 1     Initial Home Exercises Provided -- 07/24/22 07/24/22       Oxygen   Maintain Oxygen Saturation -- -- 88% or higher              Exercise Comments:   Exercise Goals and Review:   Exercise Goals     Row Name 06/26/22 1511             Exercise Goals   Increase Physical Activity Yes       Intervention Provide advice, education, support and counseling about physical activity/exercise needs.;Develop an individualized exercise prescription for aerobic and resistive training based on initial evaluation findings, risk stratification, comorbidities and participant's personal goals.       Expected Outcomes Short Term: Attend rehab on a regular basis to increase amount of physical activity.;Long Term: Add in home exercise to make exercise part of routine and to increase amount of physical activity.;Long Term: Exercising regularly at least 3-5 days a week.       Increase Strength and Stamina Yes       Intervention Provide advice, education, support and counseling about physical activity/exercise needs.;Develop an individualized exercise prescription for aerobic and resistive training based on initial evaluation findings, risk stratification, comorbidities and participant's personal goals.       Expected Outcomes Short Term: Increase workloads from initial exercise prescription for resistance, speed, and METs.;Short Term: Perform resistance training exercises routinely during rehab and add  in resistance training at home;Long Term: Improve cardiorespiratory fitness, muscular endurance and strength as measured by increased  METs and functional capacity (6MWT)       Able to understand and use rate of perceived exertion (RPE) scale Yes       Intervention Provide education and explanation on how to use RPE scale       Expected Outcomes Short Term: Able to use RPE daily in rehab to express subjective intensity level;Long Term:  Able to use RPE to guide intensity level when exercising independently       Able to understand and use Dyspnea scale Yes       Intervention Provide education and explanation on how to use Dyspnea scale       Expected Outcomes Short Term: Able to use Dyspnea scale daily in rehab to express subjective sense of shortness of breath during exertion;Long Term: Able to use Dyspnea scale to guide intensity level when exercising independently       Knowledge and understanding of Target Heart Rate Range (THRR) Yes       Intervention Provide education and explanation of THRR including how the numbers were predicted and where they are located for reference       Expected Outcomes Short Term: Able to state/look up THRR;Short Term: Able to use daily as guideline for intensity in rehab;Long Term: Able to use THRR to govern intensity when exercising independently       Able to check pulse independently Yes       Intervention Provide education and demonstration on how to check pulse in carotid and radial arteries.;Review the importance of being able to check your own pulse for safety during independent exercise       Expected Outcomes Short Term: Able to explain why pulse checking is important during independent exercise;Long Term: Able to check pulse independently and accurately       Understanding of Exercise Prescription Yes       Intervention Provide education, explanation, and written materials on patient's individual exercise prescription       Expected Outcomes Short Term: Able  to explain program exercise prescription;Long Term: Able to explain home exercise prescription to exercise independently                Exercise Goals Re-Evaluation :  Exercise Goals Re-Evaluation     Row Name 07/24/22 1525 08/01/22 1501           Exercise Goal Re-Evaluation   Exercise Goals Review Increase Physical Activity;Increase Strength and Stamina;Able to understand and use rate of perceived exertion (RPE) scale;Knowledge and understanding of Target Heart Rate Range (THRR);Understanding of Exercise Prescription;Able to understand and use Dyspnea scale;Able to check pulse independently Increase Physical Activity;Increase Strength and Stamina;Understanding of Exercise Prescription      Comments Vickki is doing well in rehab. We talked about exercise at home and she has not thought much about it. She has been walking up and down her driveway and she was encouraged to increase her duration and keep that up. She is also interested in doing the staff videos. Her daughter uses a recumbent bike and she is thinking of trying it as long as her knee feels OK with it. Reviewed home exercise with pt today.  Reviewed THR, pulse, RPE, sign and symptoms, pulse oximetery and when to call 911 or MD.  Also discussed weather considerations and indoor options.  Pt voiced understanding. Maryela is doing well in rehab. She increased her overall average MET level to 2.19 METs. She also increased her workload on the treadmill to a speed of 1.3 mph and  an incline of 0.5%. She also improved to level 2 on the recumbent bike and level 3 on the T4. She has also been using 2 lb for her right arm and 1 lb for her left arm during resistance training. We will continue to monitor her progress in the program.      Expected Outcomes Short: Build up progressively to 30 minutes of exercise at once Long: Continue to exercise at home independently Short: Continue to increase workload on treadmill. Long: Continue to increase  strength and stamina.               Discharge Exercise Prescription (Final Exercise Prescription Changes):  Exercise Prescription Changes - 08/01/22 1400       Response to Exercise   Blood Pressure (Admit) 126/62    Blood Pressure (Exercise) 148/68    Blood Pressure (Exit) 118/60    Heart Rate (Admit) 82 bpm    Heart Rate (Exercise) 120 bpm    Heart Rate (Exit) 81 bpm    Rating of Perceived Exertion (Exercise) 13    Symptoms none    Duration Continue with 30 min of aerobic exercise without signs/symptoms of physical distress.    Intensity THRR unchanged      Progression   Progression Continue to progress workloads to maintain intensity without signs/symptoms of physical distress.    Average METs 2.19      Resistance Training   Training Prescription Yes    Weight 2 lb on left, 1 lb on right    Reps 10-15      Interval Training   Interval Training No      Treadmill   MPH 1.3    Grade 0.5    Minutes 15    METs 2.08      Recumbant Bike   Level 2    Minutes 15    METs 2.5      NuStep   Level 3    Minutes 15    METs 2.1      T5 Nustep   Level 1    Minutes 15    METs 1.7      Home Exercise Plan   Plans to continue exercise at Home (comment)   walking, staff videos   Frequency Add 2 additional days to program exercise sessions.   start with 1   Initial Home Exercises Provided 07/24/22      Oxygen   Maintain Oxygen Saturation 88% or higher             Nutrition:  Target Goals: Understanding of nutrition guidelines, daily intake of sodium '1500mg'$ , cholesterol '200mg'$ , calories 30% from fat and 7% or less from saturated fats, daily to have 5 or more servings of fruits and vegetables.  Education: All About Nutrition: -Group instruction provided by verbal, written material, interactive activities, discussions, models, and posters to present general guidelines for heart healthy nutrition including fat, fiber, MyPlate, the role of sodium in heart healthy  nutrition, utilization of the nutrition label, and utilization of this knowledge for meal planning. Follow up email sent as well. Written material given at graduation. Flowsheet Row Cardiac Rehab from 08/02/2022 in [facility] Cardiac and Pulmonary Rehab  Date 06/28/22  Educator [facility]  Instruction Review Code 1- Verbalizes Understanding       Biometrics:  Pre Biometrics - 06/26/22 1511       Pre Biometrics   Height 5' (1.524 m)    Weight 141 lb 9.6 oz (64.2 kg)    BMI (  Calculated) 27.65    Single Leg Stand 1.15 seconds              Nutrition Therapy Plan and Nutrition Goals:  Nutrition Therapy & Goals - 06/28/22 1553       Nutrition Therapy   Diet Heart healthy, low Na    Protein (specify units) 75g    Fiber 20 grams    Whole Grain Foods 1 servings   ideal 3, but high fiber foods tend to bother her GI   Saturated Fats 12 max. grams    Fruits and Vegetables 5 servings/day   ideal 8, but high fiber foods tend to bother her GI   Sodium 2 grams      Personal Nutrition Goals   Nutrition Goal ST: add protein to salad for lunch; leftover meat/fish/poultry from night before, boiled eggs, beans/lentils, nuts/seeds LT: meet protein needs, improve energy    Comments 86 y.o. F admitted to cardiac rehab for s/p coronary artery stent placement. PMHx includes HTN, CAD, GERD, hypothyroidism, CKD, HLD, prediabetes, anemia, and vit D deficiency. Relevant medications inlcudes synthroid, robaxin, omeprazole, crestor, tumeric 500 mg, vit D3. PYP Score: Vegetables & Fruits 6/12. Breads, Grains & Cereals 6/12. Red & Processed Meat 9/12. Poultry 2/2. Fish & Shellfish 0/4. Beans, Nuts & Seeds 1/4. Milk & Dairy Foods 1/6. Arvis reports that since living with her daughter, she has learned a lot about nutrition - her daughter reads food labels, does not cook with salt, limits saturated fat, includes a variety of vegetables and protein with cooking, and uses olive oil when cooking. Makaylin reports eating a little  debbie apple pie with diet coke every morning for breakfast, but is not interested in changing that - encouraged her to maybe add some protein to that with greek yogurt, cottage cheese, or nuts/seeds (she does not like peanut butter). For lunch she will choose ham on white bread or more often than not, a salad with lots of vegetables - she switches up the leafy greens she uses as well as the vegetables she adds on top, sometimes she will include eggs or ham; encouraged her to add protein to salad for lunch such as leftover meat/fish/poultry from night before, boiled eggs, beans/lentils, nuts/seeds. She reports that her daughter cooks dinner which is her biggest meal and will normally be a lean source of protein and a variety of vegetables. She rpeorts loving berries and fruit as well as beans and regularly includes them in her diet. Tomoko drinks propel water and 4 diet cokes during the day - she feels she is drinking a lot of water and is not interested in cutting down on the soda she is drinking; she usually has four bottles of propel. She reports that since her hernia surgery she has had difficulty with larger amounts of fiber and she prefers white bread for her sandwiches. Discussed heart healthy eating and ensuring she is meeting her protein needs by having a good source of protein at eat meal. Discussed heart healthy eating.      Intervention Plan   Intervention Prescribe, educate and counsel regarding individualized specific dietary modifications aiming towards targeted core components such as weight, hypertension, lipid management, diabetes, heart failure and other comorbidities.    Expected Outcomes Short Term Goal: Understand basic principles of dietary content, such as calories, fat, sodium, cholesterol and nutrients.;Short Term Goal: A plan has been developed with personal nutrition goals set during dietitian appointment.;Long Term Goal: Adherence to prescribed nutrition plan.  Nutrition Assessments:  MEDIFICTS Score Key: ?70 Need to make dietary changes  40-70 Heart Healthy Diet ? 40 Therapeutic Level Cholesterol Diet  Flowsheet Row Cardiac Rehab from 06/26/2022 in [facility] Cardiac and Pulmonary Rehab  Picture Your Plate Total Score on Admission 64      Picture Your Plate Scores: <76 Unhealthy dietary pattern with much room for improvement. 41-50 Dietary pattern unlikely to meet recommendations for good health and room for improvement. 51-60 More healthful dietary pattern, with some room for improvement.  >60 Healthy dietary pattern, although there may be some specific behaviors that could be improved.    Nutrition Goals Re-Evaluation:  Nutrition Goals Re-Evaluation     [place] Name 07/24/22 1441             Goals   Nutrition Goal ST: add protein to salad for lunch; leftover meat/fish/poultry from night before, boiled eggs, beans/lentils, nuts/seeds LT: meet protein needs, improve energy       Comment Breunna states she eats egg 2-3 x/week. She also eats  fish, shrimp primarily and steak once/week. We talked about the importance of increasing protein in diet. She states she eats plentiful vegetables that are rainbow. She has been eliminating chips and cookies completely.       Expected Outcome Short: Continue to follow recommendations by RD Long: Continue to eat heart healthy diet                Nutrition Goals Discharge (Final Nutrition Goals Re-Evaluation):  Nutrition Goals Re-Evaluation - 07/24/22 1441       Goals   Nutrition Goal ST: add protein to salad for lunch; leftover meat/fish/poultry from night before, boiled eggs, beans/lentils, nuts/seeds LT: meet protein needs, improve energy    Comment Merlean states she eats egg 2-3 x/week. She also eats  fish, shrimp primarily and steak once/week. We talked about the importance of increasing protein in diet. She states she eats plentiful vegetables that are rainbow. She has been eliminating chips  and cookies completely.    Expected Outcome Short: Continue to follow recommendations by RD Long: Continue to eat heart healthy diet             Psychosocial: Target Goals: Acknowledge presence or absence of significant depression and/or stress, maximize coping skills, provide positive support system. Participant is able to verbalize types and ability to use techniques and skills needed for reducing stress and depression.   Education: Stress, Anxiety, and Depression - Group verbal and visual presentation to define topics covered.  Reviews how body is impacted by stress, anxiety, and depression.  Also discusses healthy ways to reduce stress and to treat/manage anxiety and depression.  Written material given at graduation. Flowsheet Row Cardiac Rehab from 08/02/2022 in [facility] Cardiac and Pulmonary Rehab  Date 07/26/22  Educator NT  Instruction Review Code 1- [employer] Understanding       Education: Sleep Hygiene -Provides group verbal and written instruction about how sleep can affect your health.  Define sleep hygiene, discuss sleep cycles and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

## 2022-08-10 ENCOUNTER — Ambulatory Visit: Payer: Medicare Other | Admitting: Family Medicine

## 2022-08-11 ENCOUNTER — Encounter: Payer: Medicare Other | Admitting: *Deleted

## 2022-08-11 DIAGNOSIS — Z955 Presence of coronary angioplasty implant and graft: Secondary | ICD-10-CM

## 2022-08-11 DIAGNOSIS — I208 Other forms of angina pectoris: Secondary | ICD-10-CM | POA: Diagnosis not present

## 2022-08-11 NOTE — Progress Notes (Signed)
Daily Session Note  Patient Details  Name: Andrea Hart MRN: [id number] Date of Birth: 04/02/1936 Referring Provider:   Flowsheet Row Cardiac Rehab from 06/26/2022 in [facility] Cardiac and Pulmonary Rehab  Referring Provider Fletcher Anon       Encounter Date: 08/11/2022  Check In:  Session Check In - 08/11/22 1013       Check-In   Supervising physician immediately available to respond to emergencies See telemetry face sheet for immediately available ER MD    Location [facility]-Cardiac & Pulmonary Rehab    Staff Present Heath Lark, RN, BSN, CCRP;Joseph Gresham, RCP,RRT,BSRT;Jessica Swif[place], [place], [place], CCRP, CCET    Virtual Visit No    Medication changes reported     No    Fall or balance concerns reported    No    Warm-up and Cool-down Performed on first and last piece of equipment    Resistance Training Performed Yes    VAD Patient? No    PAD/SET Patient? No      Pain Assessment   Currently in Pain? No/denies                Social History   Tobacco Use  Smoking Status Never  Smokeless Tobacco Never    Goals Met:  Independence with exercise equipment Exercise tolerated well No report of concerns or symptoms today  Goals Unmet:  Not Applicable  Comments: Pt able to follow exercise prescription today without complaint.  Will continue to monitor for progression.    Dr. Emily Filbert is Medical Director for [place].  Dr. Ottie Glazier is Medical Director for [facility] Pulmonary Rehabilitation.

## 2022-08-14 ENCOUNTER — Encounter: Payer: Medicare Other | Admitting: *Deleted

## 2022-08-14 DIAGNOSIS — I208 Other forms of angina pectoris: Secondary | ICD-10-CM | POA: Diagnosis not present

## 2022-08-14 DIAGNOSIS — Z955 Presence of coronary angioplasty implant and graft: Secondary | ICD-10-CM | POA: Diagnosis not present

## 2022-08-14 NOTE — Progress Notes (Signed)
Daily Session Note  Patient Details  Name: Andrea Hart MRN: [id number] Date of Birth: Sep 26, 1936 Referring Provider:   Flowsheet Row Cardiac Rehab from 06/26/2022 in [facility] Cardiac and Pulmonary Rehab  Referring Provider Fletcher Anon       Encounter Date: 08/14/2022  Check In:  Session Check In - 08/14/22 1400       Check-In   Supervising physician immediately available to respond to emergencies See telemetry face sheet for immediately available ER MD    Location [facility]-Cardiac & Pulmonary Rehab    Staff Present Renita Papa, RN BSN;Joseph [place], RCP,RRT,BSRT;Kara Fithian, MS, ASCM CEP, Exercise Physiologist;Noah Tickle, BS, Exercise Physiologist    Virtual Visit No    Medication changes reported     No    Fall or balance concerns reported    No    Warm-up and Cool-down Performed on first and last piece of equipment    Resistance Training Performed Yes    VAD Patient? No    PAD/SET Patient? No      Pain Assessment   Currently in Pain? No/denies                Social History   Tobacco Use  Smoking Status Never  Smokeless Tobacco Never    Goals Met:  Independence with exercise equipment Exercise tolerated well No report of concerns or symptoms today Strength training completed today  Goals Unmet:  Not Applicable  Comments: Pt able to follow exercise prescription today without complaint.  Will continue to monitor for progression.    Dr. Emily Filbert is Medical Director for [place].  Dr. Ottie Glazier is Medical Director for [facility] Pulmonary Rehabilitation.

## 2022-08-16 ENCOUNTER — Encounter: Payer: Medicare Other | Admitting: *Deleted

## 2022-08-16 DIAGNOSIS — Z955 Presence of coronary angioplasty implant and graft: Secondary | ICD-10-CM

## 2022-08-16 DIAGNOSIS — I208 Other forms of angina pectoris: Secondary | ICD-10-CM | POA: Diagnosis not present

## 2022-08-16 NOTE — Progress Notes (Signed)
Daily Session Note  Patient Details  Name: Andrea Hart MRN: [id number] Date of Birth: 07/21/1936 Referring Provider:   Flowsheet Row Cardiac Rehab from 06/26/2022 in [facility] Cardiac and Pulmonary Rehab  Referring Provider Fletcher Anon       Encounter Date: 08/16/2022  Check In:  Session Check In - 08/16/22 1441       Check-In   Supervising physician immediately available to respond to emergencies See telemetry face sheet for immediately available ER MD    Location [facility]-Cardiac & Pulmonary Rehab    Staff Present Renita Papa, RN BSN;Joseph Tessie Fass, RCP,RRT,BSRT;Noah [place], [place], Exercise Physiologist    Virtual Visit No    Medication changes reported     No    Fall or balance concerns reported    No    Warm-up and Cool-down Performed on first and last piece of equipment    Resistance Training Performed Yes    VAD Patient? No    PAD/SET Patient? No      Pain Assessment   Currently in Pain? No/denies                Social History   Tobacco Use  Smoking Status Never  Smokeless Tobacco Never    Goals Met:  Independence with exercise equipment Exercise tolerated well No report of concerns or symptoms today Strength training completed today  Goals Unmet:  Not Applicable  Comments: Pt able to follow exercise prescription today without complaint.  Will continue to monitor for progression.    Dr. Emily Filbert is Medical Director for [place].  Dr. Ottie Glazier is Medical Director for [facility] Pulmonary Rehabilitation.

## 2022-08-18 ENCOUNTER — Encounter: Payer: Medicare Other | Admitting: *Deleted

## 2022-08-18 DIAGNOSIS — Z955 Presence of coronary angioplasty implant and graft: Secondary | ICD-10-CM

## 2022-08-18 DIAGNOSIS — I208 Other forms of angina pectoris: Secondary | ICD-10-CM | POA: Diagnosis not present

## 2022-08-18 NOTE — Progress Notes (Signed)
Daily Session Note  Patient Details  Name: Andrea Hart MRN: [id number] Date of Birth: 09/17/1936 Referring Provider:   Flowsheet Row Cardiac Rehab from 06/26/2022 in [facility] Cardiac and Pulmonary Rehab  Referring Provider Fletcher Anon       Encounter Date: 08/18/2022  Check In:  Session Check In - 08/18/22 1043       Check-In   Supervising physician immediately available to respond to emergencies See telemetry face sheet for immediately available ER MD    Location [facility]-Cardiac & Pulmonary Rehab    Staff Present Heath Lark, RN, BSN, CCRP;Joseph Tessie Fass, RCP,RRT,BSRT;Other   Kathee Delton BS,Exercise Physiologist   Virtual Visit No    Medication changes reported     No    Fall or balance concerns reported    No    Warm-up and Cool-down Performed on first and last piece of equipment    Resistance Training Performed Yes    VAD Patient? No    PAD/SET Patient? No      Pain Assessment   Currently in Pain? No/denies                Social History   Tobacco Use  Smoking Status Never  Smokeless Tobacco Never    Goals Met:  Independence with exercise equipment Exercise tolerated well No report of concerns or symptoms today  Goals Unmet:  Not Applicable  Comments: Pt able to follow exercise prescription today without complaint.  Will continue to monitor for progression.    Dr. Emily Filbert is Medical Director for [place].  Dr. Ottie Glazier is Medical Director for [facility] Pulmonary Rehabilitation.

## 2022-08-21 ENCOUNTER — Encounter: Payer: Medicare Other | Admitting: *Deleted

## 2022-08-21 DIAGNOSIS — I208 Other forms of angina pectoris: Secondary | ICD-10-CM | POA: Diagnosis not present

## 2022-08-21 DIAGNOSIS — Z955 Presence of coronary angioplasty implant and graft: Secondary | ICD-10-CM

## 2022-08-21 NOTE — Progress Notes (Signed)
Daily Session Note  Patient Details  Name: Andrea Hart MRN: [id number] Date of Birth: September 15, 1936 Referring Provider:   Flowsheet Row Cardiac Rehab from 06/26/2022 in [facility] Cardiac and Pulmonary Rehab  Referring Provider Fletcher Anon       Encounter Date: 08/21/2022  Check In:  Session Check In - 08/21/22 1357       Check-In   Supervising physician immediately available to respond to emergencies See telemetry face sheet for immediately available ER MD    Location [facility]-Cardiac & Pulmonary Rehab    Staff Present Renita Papa, RN Moises Blood, BS, ACSM CEP, Exercise Physiologist;Joseph Tessie Fass, [place]    Virtual Visit No    Medication changes reported     No    Fall or balance concerns reported    No    Warm-up and Cool-down Performed on first and last piece of equipment    Resistance Training Performed Yes    VAD Patient? No    PAD/SET Patient? No      Pain Assessment   Currently in Pain? No/denies                Social History   Tobacco Use  Smoking Status Never  Smokeless Tobacco Never    Goals Met:  Independence with exercise equipment Exercise tolerated well No report of concerns or symptoms today Strength training completed today  Goals Unmet:  Not Applicable  Comments: Pt able to follow exercise prescription today without complaint.  Will continue to monitor for progression.    Dr. Emily Filbert is Medical Director for [place].  Dr. Ottie Glazier is Medical Director for [facility] Pulmonary Rehabilitation.

## 2022-09-05 ENCOUNTER — Ambulatory Visit: Payer: Medicare Other | Admitting: Physical Therapy

## 2022-09-06 ENCOUNTER — Encounter: Payer: Self-pay | Admitting: *Deleted

## 2022-09-06 DIAGNOSIS — Z955 Presence of coronary angioplasty implant and graft: Secondary | ICD-10-CM

## 2022-09-06 NOTE — Progress Notes (Signed)
Cardiac Individual Treatment Plan  Patient Details  Name: Andrea Hart MRN: [id number] Date of Birth: [birth year] Referring Provider:   Flowsheet Row Cardiac Rehab from 06/26/2022 in [facility] Cardiac and Pulmonary Rehab  Referring Provider Arida       Initial Encounter Date:  Flowsheet Row Cardiac Rehab from 06/26/2022 in [facility] Cardiac and Pulmonary Rehab  Date 06/26/22       Visit Diagnosis: Status post coronary artery stent placement  Patient's Home Medications on Admission:  Current Outpatient Medications:    albuterol (VENTOLIN HFA) 108 (90 Base) MCG/ACT inhaler, Inhale 2 puffs into the lungs every 4 (four) hours as needed for wheezing or shortness of breath., Disp: 6.7 g, Rfl: 11   amLODipine (NORVASC) 10 MG tablet, TAKE 1 TABLET BY MOUTH EVERY DAY, Disp: 90 tablet, Rfl: 1   aspirin EC 81 MG tablet, Take 81 mg by mouth daily., Disp: , Rfl:    budesonide-formoterol (SYMBICORT) 160-4.5 MCG/ACT inhaler, Inhale 2 puffs into the lungs 2 (two) times daily., Disp: 3 each, Rfl: 3   cetirizine (ZYRTEC) 10 MG tablet, Take 1 tablet (10 mg total) by mouth daily., Disp: 90 tablet, Rfl: 3   clopidogrel (PLAVIX) 75 MG tablet, Take 1 tablet (75 mg total) by mouth daily., Disp: 30 tablet, Rfl: 5   fluticasone (FLONASE) 50 MCG/ACT nasal spray, Place 2 sprays into both nostrils daily., Disp: 16 g, Rfl: 6   levothyroxine (SYNTHROID) 75 MCG tablet, Take 1 tablet (75 mcg total) by mouth daily before breakfast., Disp: 30 tablet, Rfl: 5   meclizine (ANTIVERT) 25 MG tablet, Take 0.5 tablets (12.5 mg total) by mouth 3 (three) times daily as needed for dizziness or nausea., Disp: 30 tablet, Rfl: 1   methocarbamol (ROBAXIN) 500 MG tablet, Take 1 tablet (500 mg total) by mouth at bedtime as needed for muscle spasms., Disp: 90 tablet, Rfl: 0   montelukast (SINGULAIR) 10 MG tablet, Take 1 tablet (10 mg total) by mouth at bedtime., Disp: 30 tablet, Rfl: 5   omeprazole (PRILOSEC) 40 MG capsule, Take 1 capsule (40 mg  total) by mouth 2 (two) times daily., Disp: 60 capsule, Rfl: 5   rosuvastatin (CRESTOR) 20 MG tablet, TAKE 1 TABLET BY MOUTH EVERY DAY, Disp: 90 tablet, Rfl: 1   spironolactone (ALDACTONE) 25 MG tablet, Take 0.5 tablets (12.5 mg total) by mouth daily., Disp: 15 tablet, Rfl: 5   spironolactone (ALDACTONE) 25 MG tablet, Take 12.5 mg by mouth daily., Disp: , Rfl:    Turmeric 500 MG TABS, Take 1,000 mg by mouth daily., Disp: , Rfl:    valsartan (DIOVAN) 320 MG tablet, Take 1 tablet (320 mg total) by mouth daily., Disp: 30 tablet, Rfl: 5  Past Medical History: Past Medical History:  Diagnosis Date   Abdominal hernia    Arthritis    Asthma    Carotid arterial disease ([place])    a. 11/2020 Carotid U/S: Less than 70% RICA, 48-88% LICA.   CKD (chronic kidney disease), stage III ([place])    Coronary artery disease    a. 2013 s/p PCI and stent placement in [facility] done for stable angina.   GI bleeding 02/02/2021   Hyperlipidemia    Hypertension    Hypothyroidism    PVC's (premature ventricular contractions)    a. 01/2020 Zio: RSR, 70, frequent PVCs with 10.5% burden.   Stroke [facility])    a. 2017 - ILR did not show afib.    Tobacco Use: Social History   Tobacco Use  Smoking Status Never  Smokeless Tobacco Never    Labs: Review Flowsheet  More data exists      Latest Ref Rng & Units 12/19/2019 08/16/2020 07/21/2021 01/23/2022 07/17/2022  Labs for ITP Cardiac and Pulmonary Rehab  Cholestrol 100 - 199 mg/dL 183  145  137  - 160   LDL (calc) 0 - 99 mg/dL 87  65  64  - 70   HDL-C >39 mg/dL 59.60  60  50  - 69   Trlycerides 0 - 149 mg/dL 182.0  111  128  - 122   Hemoglobin A1c 4.8 - 5.6 % - - 6.3  6.0  6.0      Exercise Target Goals: Exercise Program Goal: Individual exercise prescription set using results from initial 6 min walk test and THRR while considering  patient's activity barriers and safety.   Exercise Prescription Goal: Initial exercise prescription builds to 30-45  minutes a day of aerobic activity, 2-3 days per week.  Home exercise guidelines will be given to patient during program as part of exercise prescription that the participant will acknowledge.   Education: Aerobic Exercise: - Group verbal and visual presentation on the components of exercise prescription. Introduces F.I.T.T principle from ACSM for exercise prescriptions.  Reviews F.I.T.T. principles of aerobic exercise including progression. Written material given at graduation. Flowsheet Row Cardiac Rehab from 08/16/2022 in [facility] Cardiac and Pulmonary Rehab  Education need identified 06/26/22  Date 08/09/22  Educator [place]  Instruction Review Code 1- Verbalizes Understanding       Education: Resistance Exercise: - Group verbal and visual presentation on the components of exercise prescription. Introduces F.I.T.T principle from ACSM for exercise prescriptions  Reviews F.I.T.T. principles of resistance exercise including progression. Written material given at graduation. Flowsheet Row Cardiac Rehab from 08/16/2022 in [facility] Cardiac and Pulmonary Rehab  Date 08/16/22  Educator NT  Instruction Review Code 1- Verbalizes Understanding        Education: Exercise & Equipment Safety: - Individual verbal instruction and demonstration of equipment use and safety with use of the equipment. Flowsheet Row Cardiac Rehab from 08/16/2022 in [facility] Cardiac and Pulmonary Rehab  Date 06/26/22  Educator [facility]  Instruction Review Code 1- Verbalizes Understanding       Education: Exercise Physiology & General Exercise Guidelines: - Group verbal and written instruction with models to review the exercise physiology of the cardiovascular system and associated critical values. Provides general exercise guidelines with specific guidelines to those with heart or lung disease.  Flowsheet Row Cardiac Rehab from 08/16/2022 in [facility] Cardiac and Pulmonary Rehab  Date 08/02/22  Educator kl  Instruction Review Code 1- [employer]  Understanding       Education: Flexibility, Balance, Mind/Body Relaxation: - Group verbal and visual presentation with interactive activity on the components of exercise prescription. Introduces F.I.T.T principle from ACSM for exercise prescriptions. Reviews F.I.T.T. principles of flexibility and balance exercise training including progression. Also discusses the mind body connection.  Reviews various relaxation techniques to help reduce and manage stress (i.e. Deep breathing, progressive muscle relaxation, and visualization). Balance handout provided to take home. Written material given at graduation. Flowsheet Row Cardiac Rehab from 08/16/2022 in [facility] Cardiac and Pulmonary Rehab  Date 08/16/22  Educator NT  Instruction Review Code 1- Verbalizes Understanding       Activity Barriers & Risk Stratification:  Activity Barriers & Cardiac Risk Stratification - 06/19/22 1428       Activity Barriers & Cardiac Risk Stratification   Activity Barriers Right Knee Replacement;Left  Knee Replacement;Right Hip Replacement    Cardiac Risk Stratification Moderate             6 Minute Walk:  6 Minute Walk     Row Name 06/26/22 1504         6 Minute Walk   Phase Initial     Distance 615 feet     Walk Time 4 minutes     # of Rest Breaks 2     MPH 1.75     METS 1.39     RPE 14     Perceived Dyspnea  0     VO2 Peak 4.86     Symptoms Yes (comment)     Comments leg fatigue     Resting HR 78 bpm     Resting BP 130/74     Resting Oxygen Saturation  97 %     Exercise Oxygen Saturation  during 6 min walk 97 %     Max Ex. HR 720 bpm     Max Ex. BP 170/80     2 Minute Post BP 130/70              Oxygen Initial Assessment:   Oxygen Re-Evaluation:   Oxygen Discharge (Final Oxygen Re-Evaluation):   Initial Exercise Prescription:  Initial Exercise Prescription - 06/26/22 1500       Date of Initial Exercise RX and Referring Provider   Date 06/26/22    Referring Provider  Arida      Oxygen   Maintain Oxygen Saturation 88% or higher      Treadmill   MPH 0.8    Grade 0    Minutes 15    METs 1.6      Recumbant Bike   Level 1    RPM 50    Minutes 15    METs 1.39      NuStep   Level 1    SPM 80    Minutes 15    METs 1.39      T5 Nustep   Level 1    SPM 80    Minutes 15    METs 1.39      Biostep-RELP   Level 1    SPM 50    Minutes 15    METs 1.39      Track   Laps 5    Minutes 15    METs 1.39      Prescription Details   Frequency (times per week) 3    Duration Progress to 30 minutes of continuous aerobic without signs/symptoms of physical distress      Intensity   THRR 40-80% of Max Heartrate 101-125    Ratings of Perceived Exertion 11-13    Perceived Dyspnea 0-4      Progression   Progression Continue to progress workloads to maintain intensity without signs/symptoms of physical distress.      Resistance Training   Training Prescription Yes    Weight 2 on left/ROM on right    Reps 10-15             Perform Capillary Blood Glucose checks as needed.  Exercise Prescription Changes:   Exercise Prescription Changes     Row Name 06/26/22 1500 07/24/22 1500 08/01/22 1400 08/15/22 1400 08/30/22 1600     Response to Exercise   Blood Pressure (Admit) 130/74 -- 126/62 118/76 112/62   Blood Pressure (Exercise) 170/80 -- 148/68 160/64 --   Blood Pressure (Exit) 130/70 -- 118/60 126/74 110/60  Heart Rate (Admit) 78 bpm -- 82 bpm 79 bpm 78 bpm   Heart Rate (Exercise) 120 bpm -- 120 bpm 94 bpm 119 bpm   Heart Rate (Exit) 88 bpm -- 81 bpm 79 bpm 75 bpm   Oxygen Saturation (Admit) 97 % -- -- 94 % 94 %   Oxygen Saturation (Exercise) 97 % -- -- 95 % 94 %   Oxygen Saturation (Exit) 98 % -- -- 93 % 96 %   Rating of Perceived Exertion (Exercise) 14 -- _0 Perceived Dyspnea (Exercise) 0 -- -- -- --   Symptoms fatigue -- none none none   Comments 6 MWT results -- -- -- --   Duration -- -- Continue with 30 min of aerobic  exercise without signs/symptoms of physical distress. Continue with 30 min of aerobic exercise without signs/symptoms of physical distress. Continue with 30 min of aerobic exercise without signs/symptoms of physical distress.   Intensity -- -- THRR unchanged THRR unchanged THRR unchanged     Progression   Progression -- -- Continue to progress workloads to maintain intensity without signs/symptoms of physical distress. Continue to progress workloads to maintain intensity without signs/symptoms of physical distress. Continue to progress workloads to maintain intensity without signs/symptoms of physical distress.   Average METs -- -- 2.19 2.37 2.81     Resistance Training   Training Prescription -- -- Yes Yes Yes   Weight -- -- 2 lb on left, 1 lb on right 2 lb 2 lb   Reps -- -- 10-15 10-15 10-15     Interval Training   Interval Training -- -- No No No     Treadmill   MPH -- -- 1.3 -- 1.5   Grade -- -- 0.5 -- 0   Minutes -- -- 15 -- 15   METs -- -- 2.08 -- 2.15     Recumbant Bike   Level -- -- 2 2 2.1   Minutes -- -- _1 METs -- -- 2.5 -- 3.21     NuStep   Level -- -- _2 Minutes -- -- _3 METs -- -- 2.1 3.1 3.1     T5 Nustep   Level -- -- 1 2 --   Minutes -- -- 15 15 --   METs -- -- 1.7 2 --     Biostep-RELP   Level -- -- -- 2 2   Minutes -- -- -- 15 15   METs -- -- -- 2 3     Home Exercise Plan   Plans to continue exercise at -- Home (comment)  walking, staff videos Home (comment)  walking, staff videos Home (comment)  walking, staff videos Home (comment)  walking, staff videos   Frequency -- Add 2 additional days to program exercise sessions.  start with 1 Add 2 additional days to program exercise sessions.  start with 1 Add 2 additional days to program exercise sessions.  start with 1 Add 2 additional days to program exercise sessions.  start with 1   Initial Home Exercises Provided -- 07/24/22 07/24/22 07/24/22 07/24/22     Oxygen   Maintain Oxygen  Saturation -- -- 88% or higher 88% or higher 88% or higher            Exercise Comments:   Exercise Goals and Review:   Exercise Goals     Row Name 06/26/22 1511  Exercise Goals   Increase Physical Activity Yes       Intervention Provide advice, education, support and counseling about physical activity/exercise needs.;Develop an individualized exercise prescription for aerobic and resistive training based on initial evaluation findings, risk stratification, comorbidities and participant's personal goals.       Expected Outcomes Short Term: Attend rehab on a regular basis to increase amount of physical activity.;Long Term: Add in home exercise to make exercise part of routine and to increase amount of physical activity.;Long Term: Exercising regularly at least 3-5 days a week.       Increase Strength and Stamina Yes       Intervention Provide advice, education, support and counseling about physical activity/exercise needs.;Develop an individualized exercise prescription for aerobic and resistive training based on initial evaluation findings, risk stratification, comorbidities and participant's personal goals.       Expected Outcomes Short Term: Increase workloads from initial exercise prescription for resistance, speed, and METs.;Short Term: Perform resistance training exercises routinely during rehab and add in resistance training at home;Long Term: Improve cardiorespiratory fitness, muscular endurance and strength as measured by increased METs and functional capacity (6MWT)       Able to understand and use rate of perceived exertion (RPE) scale Yes       Intervention Provide education and explanation on how to use RPE scale       Expected Outcomes Short Term: Able to use RPE daily in rehab to express subjective intensity level;Long Term:  Able to use RPE to guide intensity level when exercising independently       Able to understand and use Dyspnea scale Yes        Intervention Provide education and explanation on how to use Dyspnea scale       Expected Outcomes Short Term: Able to use Dyspnea scale daily in rehab to express subjective sense of shortness of breath during exertion;Long Term: Able to use Dyspnea scale to guide intensity level when exercising independently       Knowledge and understanding of Target Heart Rate Range (THRR) Yes       Intervention Provide education and explanation of THRR including how the numbers were predicted and where they are located for reference       Expected Outcomes Short Term: Able to state/look up THRR;Short Term: Able to use daily as guideline for intensity in rehab;Long Term: Able to use THRR to govern intensity when exercising independently       Able to check pulse independently Yes       Intervention Provide education and demonstration on how to check pulse in carotid and radial arteries.;Review the importance of being able to check your own pulse for safety during independent exercise       Expected Outcomes Short Term: Able to explain why pulse checking is important during independent exercise;Long Term: Able to check pulse independently and accurately       Understanding of Exercise Prescription Yes       Intervention Provide education, explanation, and written materials on patient's individual exercise prescription       Expected Outcomes Short Term: Able to explain program exercise prescription;Long Term: Able to explain home exercise prescription to exercise independently                Exercise Goals Re-Evaluation :  Exercise Goals Re-Evaluation     Row Name 07/24/22 1525 08/01/22 1501 08/15/22 1438 08/30/22 1617       Exercise Goal Re-Evaluation   Exercise  Goals Review Increase Physical Activity;Increase Strength and Stamina;Able to understand and use rate of perceived exertion (RPE) scale;Knowledge and understanding of Target Heart Rate Range (THRR);Understanding of Exercise Prescription;Able to  understand and use Dyspnea scale;Able to check pulse independently Increase Physical Activity;Increase Strength and Stamina;Understanding of Exercise Prescription Increase Physical Activity;Increase Strength and Stamina;Understanding of Exercise Prescription Increase Physical Activity;Increase Strength and Stamina;Understanding of Exercise Prescription    Comments Vala is doing well in rehab. We talked about exercise at home and she has not thought much about it. She has been walking up and down her driveway and she was encouraged to increase her duration and keep that up. She is also interested in doing the staff videos. Her daughter uses a recumbent bike and she is thinking of trying it as long as her knee feels OK with it. Reviewed home exercise with pt today.  Reviewed THR, pulse, RPE, sign and symptoms, pulse oximetery and when to call 911 or MD.  Also discussed weather considerations and indoor options.  Pt voiced understanding. Lashona is doing well in rehab. She increased her overall average MET level to 2.19 METs. She also increased her workload on the treadmill to a speed of 1.3 mph and an incline of 0.5%. She also improved to level 2 on the recumbent bike and level 3 on the T4. She has also been using 2 lb for her right arm and 1 lb for her left arm during resistance training. We will continue to monitor her progress in the program. Arnisha is doing well in rehab.  She is up to 3.1 METs on the NuStep and level 2 on the BioStep.  We will continue to monitor her progress. Josue continues to do well in rehab. She is due for her psot 6MWT and will look to improve on that. She also improved her overall average MET level to 2.81 METs. She increased her speed on the treadmill as well to 1.5 mph. We will continue to monitor her progress in the program.    Expected Outcomes Short: Build up progressively to 30 minutes of exercise at once Long: Continue to exercise at home independently Short: Continue to  increase workload on treadmill. Long: Continue to increase strength and stamina. Short: Continue to encourage her to walk more Long: Continue to improve stamina Short: Improve on post 6MWT. Long: Continue to improve strength and stamina             Discharge Exercise Prescription (Final Exercise Prescription Changes):  Exercise Prescription Changes - 08/30/22 1600       Response to Exercise   Blood Pressure (Admit) 112/62    Blood Pressure (Exit) 110/60    Heart Rate (Admit) 78 bpm    Heart Rate (Exercise) 119 bpm    Heart Rate (Exit) 75 bpm    Oxygen Saturation (Admit) 94 %    Oxygen Saturation (Exercise) 94 %    Oxygen Saturation (Exit) 96 %    Rating of Perceived Exertion (Exercise) 17    Symptoms none    Duration Continue with 30 min of aerobic exercise without signs/symptoms of physical distress.    Intensity THRR unchanged      Progression   Progression Continue to progress workloads to maintain intensity without signs/symptoms of physical distress.    Average METs 2.81      Resistance Training   Training Prescription Yes    Weight 2 lb    Reps 10-15      Interval Training   Interval Training  No      Treadmill   MPH 1.5    Grade 0    Minutes 15    METs 2.15      Recumbant Bike   Level 2.1    Minutes 15    METs 3.21      NuStep   Level 3    Minutes 15    METs 3.1      Biostep-RELP   Level 2    Minutes 15    METs 3      Home Exercise Plan   Plans to continue exercise at Home (comment)   walking, staff videos   Frequency Add 2 additional days to program exercise sessions.   start with 1   Initial Home Exercises Provided 07/24/22      Oxygen   Maintain Oxygen Saturation 88% or higher             Nutrition:  Target Goals: Understanding of nutrition guidelines, daily intake of sodium <1553m, cholesterol <2053m calories 30% from fat and 7% or less from saturated fats, daily to have 5 or more servings of fruits and vegetables.  Education:  All About Nutrition: -Group instruction provided by verbal, written material, interactive activities, discussions, models, and posters to present general guidelines for heart healthy nutrition including fat, fiber, MyPlate, the role of sodium in heart healthy nutrition, utilization of the nutrition label, and utilization of this knowledge for meal planning. Follow up email sent as well. Written material given at graduation. [facility]  Date 06/28/22  [facility] 1- Verbalizes Understanding       Biometrics:  Pre Biometrics - 06/26/22 1511       Pre Biometrics   Height 5' (1.524 m)    Weight 141 lb 9.6 oz (64.2 kg)    BMI (Calculated) 27.65    Single Leg Stand 1.15 seconds              Nutrition Therapy Plan and Nutrition Goals:  Nutrition Therapy & Goals - 06/28/22 1553       Nutrition Therapy   Diet Heart healthy, low Na    Protein (specify units) 75g    Fiber 20 grams    Whole Grain Foods 1 servings   ideal 3, but high fiber foods tend to bother her GI   Saturated Fats 12 max. grams    Fruits and Vegetables 5 servings/day   ideal 8, but high fiber foods tend to bother her GI   Sodium 2 grams      Personal Nutrition Goals   Nutrition Goal ST: add protein to salad for lunch; leftover meat/fish/poultry from night before, boiled eggs, beans/lentils, nuts/seeds LT: meet protein needs, improve energy    Comments [age].o. F admitted to cardiac rehab for s/p coronary artery stent placement. PMHx includes HTN, CAD, GERD, hypothyroidism, CKD, HLD, prediabetes, anemia, and vit D deficiency. Relevant medications inlcudes synthroid, robaxin, omeprazole, crestor, tumeric 500 mg, vit D3. PYP Score: Vegetables & Fruits 6/12. Breads, Grains & Cereals 6/12. Red & Processed Meat 9/12. Poultry 2/2. Fish & Shellfish 0/4. Beans, Nuts & Seeds 1/4. Milk & Dairy Foods 1/6. Graclyn reports that since living  with her daughter, she has learned a lot about nutrition - her daughter reads food labels, does not cook with salt, limits saturated fat, includes a variety of vegetables and protein with cooking, and uses olive oil when cooking. Jackquelyn reports eating  a little debbie apple pie with diet coke every morning for breakfast, but is not interested in changing that - encouraged her to maybe add some protein to that with greek yogurt, cottage cheese, or nuts/seeds (she does not like peanut butter). For lunch she will choose ham on white bread or more often than not, a salad with lots of vegetables - she switches up the leafy greens she uses as well as the vegetables she adds on top, sometimes she will include eggs or ham; encouraged her to add protein to salad for lunch such as leftover meat/fish/poultry from night before, boiled eggs, beans/lentils, nuts/seeds. She reports that her daughter cooks dinner which is her biggest meal and will normally be a lean source of protein and a variety of vegetables. She rpeorts loving berries and fruit as well as beans and regularly includes them in her diet. Donye drinks propel water and 4 diet cokes during the day - she feels she is drinking a lot of water and is not interested in cutting down on the soda she is drinking; she usually has four bottles of propel. She reports that since her hernia surgery she has had difficulty with larger amounts of fiber and she prefers white bread for her sandwiches. Discussed heart healthy eating and ensuring she is meeting her protein needs by having a good source of protein at eat meal. Discussed heart healthy eating.      Intervention Plan   Intervention Prescribe, educate and counsel regarding individualized specific dietary modifications aiming towards targeted core components such as weight, hypertension, lipid management, diabetes, heart failure and other comorbidities.    Expected Outcomes Short Term Goal: Understand basic principles of  dietary content, such as calories, fat, sodium, cholesterol and nutrients.;Short Term Goal: A plan has been developed with personal nutrition goals set during dietitian appointment.;Long Term Goal: Adherence to prescribed nutrition plan.             Nutrition Assessments:  MEDIFICTS Score Key: ?70 Need to make dietary changes  40-70 Heart Healthy Diet ? 40 Therapeutic Level Cholesterol Diet  Flowsheet Row Cardiac Rehab from 06/26/2022 in [facility] Cardiac and Pulmonary Rehab  Picture Your Plate Total Score on Admission 64      Picture Your Plate Scores: <35 Unhealthy dietary pattern with much room for improvement. 41-50 Dietary pattern unlikely to meet recommendations for good health and room for improvement. 51-60 More healthful dietary pattern, with some room for improvement.  >60 Healthy dietary pattern, although there may be some specific behaviors that could be improved.    Nutrition Goals Re-Evaluation:  Nutrition Goals Re-Evaluation     [place] Name 07/24/22 1441             Goals   Nutrition Goal ST: add protein to salad for lunch; leftover meat/fish/poultry from night before, boiled eggs, beans/lentils, nuts/seeds LT: meet protein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

## 2022-09-11 ENCOUNTER — Encounter: Payer: Medicare Other | Attending: Cardiovascular Disease

## 2022-09-11 DIAGNOSIS — Z955 Presence of coronary angioplasty implant and graft: Secondary | ICD-10-CM | POA: Insufficient documentation

## 2022-09-11 DIAGNOSIS — I2089 Other forms of angina pectoris: Secondary | ICD-10-CM | POA: Insufficient documentation

## 2022-09-11 DIAGNOSIS — Z48812 Encounter for surgical aftercare following surgery on the circulatory system: Secondary | ICD-10-CM | POA: Insufficient documentation

## 2022-09-12 ENCOUNTER — Ambulatory Visit: Payer: Medicare Other | Admitting: Physical Therapy

## 2022-09-14 ENCOUNTER — Telehealth: Payer: Self-pay

## 2022-09-14 NOTE — Telephone Encounter (Signed)
Spoke with patient who has not been able to find transportation to rehab. She is going to call a couple more people this weekend to find stable transportation- I did let her know we can't hold the time slot much longer and that her 18 weeks would be up at the end of November. Patient understood and will call us next weekend to keep [place] updated.

## 2022-09-18 ENCOUNTER — Encounter: Payer: Medicare Other | Admitting: *Deleted

## 2022-09-18 DIAGNOSIS — Z48812 Encounter for surgical aftercare following surgery on the circulatory system: Secondary | ICD-10-CM | POA: Diagnosis not present

## 2022-09-18 DIAGNOSIS — I2089 Other forms of angina pectoris: Secondary | ICD-10-CM | POA: Diagnosis not present

## 2022-09-18 DIAGNOSIS — Z955 Presence of coronary angioplasty implant and graft: Secondary | ICD-10-CM | POA: Diagnosis not present
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
## 2022-09-19 ENCOUNTER — Encounter: Payer: Medicare Other | Admitting: Physical Therapy

## 2022-09-22 ENCOUNTER — Encounter: Payer: Medicare Other | Admitting: *Deleted

## 2022-09-22 VITALS — Ht 60.0 in | Wt 140.7 lb

## 2022-09-22 DIAGNOSIS — Z48812 Encounter for surgical aftercare following surgery on the circulatory system: Secondary | ICD-10-CM | POA: Diagnosis not present

## 2022-09-22 DIAGNOSIS — Z955 Presence of coronary angioplasty implant and graft: Secondary | ICD-10-CM | POA: Diagnosis not present

## 2022-09-22 DIAGNOSIS — I2089 Other forms of angina pectoris: Secondary | ICD-10-CM | POA: Diagnosis not present

## 2022-09-22 NOTE — Patient Instructions (Signed)
Discharge Patient Instructions  Patient Details  Name: Andrea Hart MRN: [id number] Date of Birth: 05-06-1936 Referring Provider:  [place]ia Crews, MD   Number of Visits: 58  Reason for Discharge:  Patient reached a stable level of exercise. Patient independent in their exercise. Patient has met program and personal goals.  Smoking History:  Social History   Tobacco Use  Smoking Status Never  Smokeless Tobacco Never    Diagnosis:  No diagnosis found.  Initial Exercise Prescription:  Initial Exercise Prescription - 06/26/22 1500       Date of Initial Exercise RX and Referring Provider   Date 06/26/22    Referring Provider Arida      Oxygen   Maintain Oxygen Saturation 88% or higher      Treadmill   MPH 0.8    Grade 0    Minutes 15    METs 1.6      Recumbant Bike   Level 1    RPM 50    Minutes 15    METs 1.39      NuStep   Level 1    SPM 80    Minutes 15    METs 1.39      T5 Nustep   Level 1    SPM 80    Minutes 15    METs 1.39      Biostep-RELP   Level 1    SPM 50    Minutes 15    METs 1.39      Track   Laps 5    Minutes 15    METs 1.39      Prescription Details   Frequency (times per week) 3    Duration Progress to 30 minutes of continuous aerobic without signs/symptoms of physical distress      Intensity   THRR 40-80% of Max Heartrate 101-125    Ratings of Perceived Exertion 11-13    Perceived Dyspnea 0-4      Progression   Progression Continue to progress workloads to maintain intensity without signs/symptoms of physical distress.      Resistance Training   Training Prescription Yes    Weight 2 on left/ROM on right    Reps 10-15             Discharge Exercise Prescription (Final Exercise Prescription Changes):  Exercise Prescription Changes - 08/30/22 1600       Response to Exercise   Blood Pressure (Admit) 112/62    Blood Pressure (Exit) 110/60    Heart Rate (Admit) 78 bpm    Heart Rate (Exercise) 119 bpm     Heart Rate (Exit) 75 bpm    Oxygen Saturation (Admit) 94 %    Oxygen Saturation (Exercise) 94 %    Oxygen Saturation (Exit) 96 %    Rating of Perceived Exertion (Exercise) 17    Symptoms none    Duration Continue with 30 min of aerobic exercise without signs/symptoms of physical distress.    Intensity THRR unchanged      Progression   Progression Continue to progress workloads to maintain intensity without signs/symptoms of physical distress.    Average METs 2.81      Resistance Training   Training Prescription Yes    Weight 2 lb    Reps 10-15      Interval Training   Interval Training No      Treadmill   MPH 1.5    Grade 0    Minutes 15    METs  2.15      Recumbant Bike   Level 2.1    Minutes 15    METs 3.21      NuStep   Level 3    Minutes 15    METs 3.1      Biostep-RELP   Level 2    Minutes 15    METs 3      Home Exercise Plan   Plans to continue exercise at Home (comment)   walking, staff videos   Frequency Add 2 additional days to program exercise sessions.   start with 1   Initial Home Exercises Provided 07/24/22      Oxygen   Maintain Oxygen Saturation 88% or higher             Functional Capacity:  6 Minute Walk     Row Name 06/26/22 1504 09/22/22 1029       6 Minute Walk   Phase Initial Discharge    Distance 615 feet 820 feet    Distance % Change -- 33.3 %    Distance Feet Change -- 205 ft    Walk Time 4 minutes 6 minutes    # of Rest Breaks 2 0    MPH 1.75 1.56    METS 1.39 1.13    RPE 14 15    Perceived Dyspnea  0 1    VO2 Peak 4.86 3.95    Symptoms Yes (comment) Yes (comment)    Comments leg fatigue SOB    Resting HR 78 bpm 72 bpm    Resting BP 130/74 128/72    Resting Oxygen Saturation  97 % 97 %    Exercise Oxygen Saturation  during 6 min walk 97 % 94 %    Max Ex. HR 720 bpm 101 bpm    Max Ex. BP 170/80 126/64    2 Minute Post BP 130/70 --               Nutrition & Weight - Outcomes:  Pre Biometrics -  06/26/22 1511       Pre Biometrics   Height 5' (1.524 m)    Weight 141 lb 9.6 oz (64.2 kg)    BMI (Calculated) 27.65    Single Leg Stand 1.15 seconds             Post Biometrics - 09/22/22 1030        Post  Biometrics   Height 5' (1.524 m)    Weight 140 lb 11.2 oz (63.8 kg)    BMI (Calculated) 27.48    Single Leg Stand 3.1 seconds             Nutrition:  Nutrition Therapy & Goals - 06/28/22 1553       Nutrition Therapy   Diet Heart healthy, low Na    Protein (specify units) 75g    Fiber 20 grams    Whole Grain Foods 1 servings   ideal 3, but high fiber foods tend to bother her GI   Saturated Fats 12 max. grams    Fruits and Vegetables 5 servings/day   ideal 8, but high fiber foods tend to bother her GI   Sodium 2 grams      Personal Nutrition Goals   Nutrition Goal ST: add protein to salad for lunch; leftover meat/fish/poultry from night before, boiled eggs, beans/lentils, nuts/seeds LT: meet protein needs, improve energy    Comments 86 y.o. F admitted to cardiac rehab for s/p coronary artery stent placement. PMHx  includes HTN, CAD, GERD, hypothyroidism, CKD, HLD, prediabetes, anemia, and vit D deficiency. Relevant medications inlcudes synthroid, robaxin, omeprazole, crestor, tumeric 500 mg, vit D3. PYP Score: Vegetables & Fruits 6/12. Breads, Grains & Cereals 6/12. Red & Processed Meat 9/12. Poultry 2/2. Fish & Shellfish 0/4. Beans, Nuts & Seeds 1/4. Milk & Dairy Foods 1/6. Andrea Hart reports that since living with her daughter, she has learned a lot about nutrition - 86 daughter reads food labels, does not cook with salt, limits saturated fat, includes a variety of vegetables and protein with cooking, and uses olive oil when cooking. Andrea Hart reports eating a little debbie apple pie with diet coke every morning for breakfast, but is not interested in changing that - encouraged her to maybe add some protein to that with greek yogurt, cottage cheese, or nuts/seeds (she does not  like peanut butter). For lunch she will choose ham on white bread or more often than not, a salad with lots of vegetables - she switches up the leafy greens she uses as well as the vegetables she adds on top, sometimes she will include eggs or ham; encouraged her to add protein to salad for lunch such as leftover meat/fish/poultry from night before, boiled eggs, beans/lentils, nuts/seeds. She reports that her daughter cooks dinner which is her biggest meal and will normally be a lean source of protein and a variety of vegetables. She rpeorts loving berries and fruit as well as beans and regularly includes them in her diet. Brandee drinks propel water and 4 diet cokes during the day - she feels she is drinking a lot of water and is not interested in cutting down on the soda she is drinking; she usually has four bottles of propel. She reports that since her hernia surgery she has had difficulty with larger amounts of fiber and she prefers white bread for her sandwiches. Discussed heart healthy eating and ensuring she is meeting her protein needs by having a good source of protein at eat meal. Discussed heart healthy eating.      Intervention Plan   Intervention Prescribe, educate and counsel regarding individualized specific dietary modifications aiming towards targeted core components such as weight, hypertension, lipid management, diabetes, heart failure and other comorbidities.    Expected Outcomes Short Term Goal: Understand basic principles of dietary content, such as calories, fat, sodium, cholesterol and nutrients.;Short Term Goal: A plan has been developed with personal nutrition goals set during dietitian appointment.;Long Term Goal: Adherence to prescribed nutrition plan.            Goals reviewed with patient; copy given to patient.

## 2022-09-22 NOTE — Progress Notes (Signed)
Daily Session Note  Patient Details  Name: Andrea Hart MRN: [id number] Date of Birth: [birth year] Referring Provider:   Flowsheet Row Cardiac Rehab from 06/26/2022 in [facility]  Referring Provider Fletcher Anon       Encounter Date: 09/22/2022  Check In:  Session Check In - 09/22/22 1038       Check-In   Supervising physician immediately available to respond to emergencies See telemetry face sheet for immediately available ER MD    Location [facility]-Cardiac & Pulmonary Rehab    Staff Present Heath Lark, RN, BSN, CCRP;Jessica [place], MA, RCEP, CCRP, CCET;Joseph [place], [place]    Virtual Visit No    Medication changes reported     No    Fall or balance concerns reported    No    Warm-up and Cool-down Performed on first and last piece of equipment    Resistance Training Performed Yes    VAD Patient? No    PAD/SET Patient? No      Pain Assessment   Currently in Pain? No/denies              6 Minute Walk     Row Name 06/26/22 1504 09/22/22 1029       6 Minute Walk   Phase Initial Discharge    Distance 615 feet 820 feet    Distance % Change -- 33.3 %    Distance Feet Change -- 205 ft    Walk Time 4 minutes 6 minutes    # of Rest Breaks 2 0    MPH 1.75 1.56    METS 1.39 1.13    RPE 14 15    Perceived Dyspnea  0 1    VO2 Peak 4.86 3.95    Symptoms Yes (comment) Yes (comment)    Comments leg fatigue SOB    Resting HR 78 bpm 72 bpm    Resting BP 130/74 128/72    Resting Oxygen Saturation  97 % 97 %    Exercise Oxygen Saturation  during 6 min walk 97 % 94 %    Max Ex. HR 720 bpm 101 bpm    Max Ex. BP 170/80 126/64    2 Minute Post BP 130/70 --               Social History   Tobacco Use  Smoking Status Never  Smokeless Tobacco Never    Goals Met:  Independence with exercise equipment Exercise tolerated well No report of concerns or symptoms today  Goals Unmet:  Not Applicable  Comments: Pt able to follow exercise  prescription today without complaint.  Will continue to monitor for progression.    Dr. Emily Filbert is Medical Director for [facility].  Dr. Ottie Glazier is Medical Director for [facility].

## 2022-09-25 ENCOUNTER — Encounter: Payer: Medicare Other | Admitting: *Deleted

## 2022-09-25 DIAGNOSIS — I2089 Other forms of angina pectoris: Secondary | ICD-10-CM | POA: Diagnosis not present

## 2022-09-25 DIAGNOSIS — Z48812 Encounter for surgical aftercare following surgery on the circulatory system: Secondary | ICD-10-CM | POA: Diagnosis not present

## 2022-09-25 DIAGNOSIS — Z955 Presence of coronary angioplasty implant and graft: Secondary | ICD-10-CM | POA: Diagnosis not present

## 2022-09-25 NOTE — Progress Notes (Signed)
Daily Session Note  Patient Details  Name: Andrea Hart MRN: [id number] Date of Birth: 10/08/1936 Referring Provider:   Flowsheet Row Cardiac Rehab from 06/26/2022 in [facility]  Referring Provider Arida       Encounter Date: 09/25/2022  Check In:  Session Check In - 09/25/22 1353       Check-In   Supervising physician immediately available to respond to emergencies See telemetry face sheet for immediately available ER MD    Location [facility]    Staff Present Jessica Hawkins, MA, RCEP, CCRP, CCET;Joseph Hood, RCP,RRT,BSRT;Megan Smith, RN, ADN    Virtual Visit No    Medication changes reported     No    Fall or balance concerns reported    No    Warm-up and Cool-down Performed on first and last piece of equipment    Resistance Training Performed Yes    VAD Patient? No    PAD/SET Patient? No      Pain Assessment   Currently in Pain? No/denies                Social History   Tobacco Use  Smoking Status Never  Smokeless Tobacco Never    Goals Met:  Proper associated with RPD/PD & O2 Sat Independence with exercise equipment Exercise tolerated well No report of concerns or symptoms today Strength training completed today  Goals Unmet:  Not Applicable  Comments: Pt able to follow exercise prescription today without complaint.  Will continue to monitor for progression.    Dr. Mark Miller is Medical Director for [facility].  Dr. Fuad Aleskerov is Medical Director for [facility]. 

## 2022-09-26 ENCOUNTER — Encounter: Payer: Medicare Other | Admitting: Physical Therapy

## 2022-09-29 ENCOUNTER — Encounter: Payer: Medicare Other | Admitting: *Deleted

## 2022-09-29 DIAGNOSIS — I2089 Other forms of angina pectoris: Secondary | ICD-10-CM | POA: Diagnosis not present

## 2022-09-29 DIAGNOSIS — Z955 Presence of coronary angioplasty implant and graft: Secondary | ICD-10-CM

## 2022-09-29 DIAGNOSIS — Z48812 Encounter for surgical aftercare following surgery on the circulatory system: Secondary | ICD-10-CM | POA: Diagnosis not present

## 2022-09-29 NOTE — Progress Notes (Signed)
Daily Session Note  Patient Details  Name: Andrea Hart MRN: [id number] Date of Birth: 07/21/1936 Referring Provider:   Flowsheet Row Cardiac Rehab from 06/26/2022 in [facility]  Referring Provider Arida       Encounter Date: 09/29/2022  Check In:  Session Check In - 09/29/22 1002       Check-In   Supervising physician immediately available to respond to emergencies See telemetry face sheet for immediately available ER MD    Location [facility]    Staff Present Susanne Bice, RN, BSN, CCRP;Jessica Hawkins, MA, RCEP, CCRP, CCET;Joseph Hood, RCP,RRT,BSRT    Virtual Visit No    Medication changes reported     No    Fall or balance concerns reported    No    Warm-up and Cool-down Performed on first and last piece of equipment    Resistance Training Performed Yes    VAD Patient? No    PAD/SET Patient? No      Pain Assessment   Currently in Pain? No/denies                Social History   Tobacco Use  Smoking Status Never  Smokeless Tobacco Never    Goals Met:  Independence with exercise equipment Exercise tolerated well No report of concerns or symptoms today   Goals Unmet:  Not Applicable  Comments: Pt able to follow exercise prescription today without complaint.  Will continue to monitor for progression.    Dr. Mark Miller is Medical Director for [facility].  Dr. Fuad Aleskerov is Medical Director for [facility]. 

## 2022-10-02 ENCOUNTER — Encounter: Payer: Medicare Other | Admitting: *Deleted

## 2022-10-02 DIAGNOSIS — Z955 Presence of coronary angioplasty implant and graft: Secondary | ICD-10-CM | POA: Diagnosis not present

## 2022-10-02 DIAGNOSIS — Z48812 Encounter for surgical aftercare following surgery on the circulatory system: Secondary | ICD-10-CM | POA: Diagnosis not present

## 2022-10-02 DIAGNOSIS — I2089 Other forms of angina pectoris: Secondary | ICD-10-CM | POA: Diagnosis not present

## 2022-10-02 NOTE — Progress Notes (Signed)
Daily Session Note  Patient Details  Name: Andrea Hart MRN: [id number] Date of Birth: 1936/10/29 Referring Provider:   Flowsheet Row Cardiac Rehab from 06/26/2022 in [facility]  Referring Provider Fletcher Anon       Encounter Date: 10/02/2022  Check In:  Session Check In - 10/02/22 1413       Check-In   Supervising physician immediately available to respond to emergencies See telemetry face sheet for immediately available ER MD    Location [facility]    Staff Present Renita Papa, RN BSN;Megan Tamala Julian, RN, Terie Purser, RCP,RRT,BSRT    Virtual Visit No    Medication changes reported     No    Fall or balance concerns reported    No    Warm-up and Cool-down Performed on first and last piece of equipment    Resistance Training Performed Yes    VAD Patient? No    PAD/SET Patient? No      Pain Assessment   Currently in Pain? No/denies                Social History   Tobacco Use  Smoking Status Never  Smokeless Tobacco Never    Goals Met:  Independence with exercise equipment Exercise tolerated well No report of concerns or symptoms today Strength training completed today  Goals Unmet:  Not Applicable  Comments: Pt able to follow exercise prescription today without complaint.  Will continue to monitor for progression.    Dr. Emily Filbert is Medical Director for [facility].  Dr. Ottie Glazier is Medical Director for [facility].

## 2022-10-03 ENCOUNTER — Encounter: Payer: Medicare Other | Admitting: Physical Therapy

## 2022-10-04 ENCOUNTER — Encounter: Payer: Self-pay | Admitting: *Deleted

## 2022-10-04 DIAGNOSIS — Z955 Presence of coronary angioplasty implant and graft: Secondary | ICD-10-CM

## 2022-10-04 NOTE — Progress Notes (Signed)
Cardiac Individual Treatment Plan  Patient Details  Name: Andrea Hart MRN: [id number] Date of Birth: [birth year] Referring Provider:   Flowsheet Row Cardiac Rehab from 06/26/2022 in [facility] Cardiac and Pulmonary Rehab  Referring Provider Arida       Initial Encounter Date:  Flowsheet Row Cardiac Rehab from 06/26/2022 in [facility] Cardiac and Pulmonary Rehab  Date 06/26/22       Visit Diagnosis: Status post coronary artery stent placement  Patient's Home Medications on Admission:  Current Outpatient Medications:    albuterol (VENTOLIN HFA) 108 (90 Base) MCG/ACT inhaler, Inhale 2 puffs into the lungs every 4 (four) hours as needed for wheezing or shortness of breath., Disp: 6.7 g, Rfl: 11   amLODipine (NORVASC) 10 MG tablet, TAKE 1 TABLET BY MOUTH EVERY DAY, Disp: 90 tablet, Rfl: 1   aspirin EC 81 MG tablet, Take 81 mg by mouth daily., Disp: , Rfl:    budesonide-formoterol (SYMBICORT) 160-4.5 MCG/ACT inhaler, Inhale 2 puffs into the lungs 2 (two) times daily., Disp: 3 each, Rfl: 3   cetirizine (ZYRTEC) 10 MG tablet, Take 1 tablet (10 mg total) by mouth daily., Disp: 90 tablet, Rfl: 3   clopidogrel (PLAVIX) 75 MG tablet, Take 1 tablet (75 mg total) by mouth daily., Disp: 30 tablet, Rfl: 5   fluticasone (FLONASE) 50 MCG/ACT nasal spray, Place 2 sprays into both nostrils daily., Disp: 16 g, Rfl: 6   levothyroxine (SYNTHROID) 75 MCG tablet, Take 1 tablet (75 mcg total) by mouth daily before breakfast., Disp: 30 tablet, Rfl: 5   meclizine (ANTIVERT) 25 MG tablet, Take 0.5 tablets (12.5 mg total) by mouth 3 (three) times daily as needed for dizziness or nausea., Disp: 30 tablet, Rfl: 1   methocarbamol (ROBAXIN) 500 MG tablet, Take 1 tablet (500 mg total) by mouth at bedtime as needed for muscle spasms., Disp: 90 tablet, Rfl: 0   montelukast (SINGULAIR) 10 MG tablet, Take 1 tablet (10 mg total) by mouth at bedtime., Disp: 30 tablet, Rfl: 5   omeprazole (PRILOSEC) 40 MG capsule, Take 1 capsule (40 mg  total) by mouth 2 (two) times daily., Disp: 60 capsule, Rfl: 5   rosuvastatin (CRESTOR) 20 MG tablet, TAKE 1 TABLET BY MOUTH EVERY DAY, Disp: 90 tablet, Rfl: 1   spironolactone (ALDACTONE) 25 MG tablet, Take 0.5 tablets (12.5 mg total) by mouth daily., Disp: 15 tablet, Rfl: 5   spironolactone (ALDACTONE) 25 MG tablet, Take 12.5 mg by mouth daily., Disp: , Rfl:    Turmeric 500 MG TABS, Take 1,000 mg by mouth daily., Disp: , Rfl:    valsartan (DIOVAN) 320 MG tablet, Take 1 tablet (320 mg total) by mouth daily., Disp: 30 tablet, Rfl: 5  Past Medical History: Past Medical History:  Diagnosis Date   Abdominal hernia    Arthritis    Asthma    Carotid arterial disease ([place])    a. 11/2020 Carotid U/S: Less than 70% RICA, 48-88% LICA.   CKD (chronic kidney disease), stage III ([place])    Coronary artery disease    a. 2013 s/p PCI and stent placement in [facility] done for stable angina.   GI bleeding 02/02/2021   Hyperlipidemia    Hypertension    Hypothyroidism    PVC's (premature ventricular contractions)    a. 01/2020 Zio: RSR, 70, frequent PVCs with 10.5% burden.   Stroke [facility])    a. 2017 - ILR did not show afib.    Tobacco Use: Social History   Tobacco Use  Smoking Status Never  Smokeless Tobacco Never    Labs: Review Flowsheet  More data exists      Latest Ref Rng & Units 12/19/2019 08/16/2020 07/21/2021 01/23/2022 07/17/2022  Labs for ITP Cardiac and Pulmonary Rehab  Cholestrol 100 - 199 mg/dL 183  145  137  - 160   LDL (calc) 0 - 99 mg/dL 87  65  64  - 70   HDL-C >39 mg/dL 59.60  60  50  - 69   Trlycerides 0 - 149 mg/dL 182.0  111  128  - 122   Hemoglobin A1c 4.8 - 5.6 % - - 6.3  6.0  6.0      Exercise Target Goals: Exercise Program Goal: Individual exercise prescription set using results from initial 6 min walk test and THRR while considering  patient's activity barriers and safety.   Exercise Prescription Goal: Initial exercise prescription builds to 30-45  minutes a day of aerobic activity, 2-3 days per week.  Home exercise guidelines will be given to patient during program as part of exercise prescription that the participant will acknowledge.   Education: Aerobic Exercise: - Group verbal and visual presentation on the components of exercise prescription. Introduces F.I.T.T principle from ACSM for exercise prescriptions.  Reviews F.I.T.T. principles of aerobic exercise including progression. Written material given at graduation. Flowsheet Row Cardiac Rehab from 08/16/2022 in [facility] Cardiac and Pulmonary Rehab  Education need identified 06/26/22  Date 08/09/22  Educator [place]  Instruction Review Code 1- Verbalizes Understanding       Education: Resistance Exercise: - Group verbal and visual presentation on the components of exercise prescription. Introduces F.I.T.T principle from ACSM for exercise prescriptions  Reviews F.I.T.T. principles of resistance exercise including progression. Written material given at graduation. Flowsheet Row Cardiac Rehab from 08/16/2022 in [facility] Cardiac and Pulmonary Rehab  Date 08/16/22  Educator NT  Instruction Review Code 1- Verbalizes Understanding        Education: Exercise & Equipment Safety: - Individual verbal instruction and demonstration of equipment use and safety with use of the equipment. Flowsheet Row Cardiac Rehab from 08/16/2022 in [facility] Cardiac and Pulmonary Rehab  Date 06/26/22  Educator [facility]  Instruction Review Code 1- Verbalizes Understanding       Education: Exercise Physiology & General Exercise Guidelines: - Group verbal and written instruction with models to review the exercise physiology of the cardiovascular system and associated critical values. Provides general exercise guidelines with specific guidelines to those with heart or lung disease.  Flowsheet Row Cardiac Rehab from 08/16/2022 in [facility] Cardiac and Pulmonary Rehab  Date 08/02/22  Educator kl  Instruction Review Code 1- [employer]  Understanding       Education: Flexibility, Balance, Mind/Body Relaxation: - Group verbal and visual presentation with interactive activity on the components of exercise prescription. Introduces F.I.T.T principle from ACSM for exercise prescriptions. Reviews F.I.T.T. principles of flexibility and balance exercise training including progression. Also discusses the mind body connection.  Reviews various relaxation techniques to help reduce and manage stress (i.e. Deep breathing, progressive muscle relaxation, and visualization). Balance handout provided to take home. Written material given at graduation. Flowsheet Row Cardiac Rehab from 08/16/2022 in [facility] Cardiac and Pulmonary Rehab  Date 08/16/22  Educator NT  Instruction Review Code 1- Verbalizes Understanding       Activity Barriers & Risk Stratification:  Activity Barriers & Cardiac Risk Stratification - 06/19/22 1428       Activity Barriers & Cardiac Risk Stratification   Activity Barriers Right Knee Replacement;Left  Knee Replacement;Right Hip Replacement    Cardiac Risk Stratification Moderate             6 Minute Walk:  6 Minute Walk     Row Name 06/26/22 1504 09/22/22 1029       6 Minute Walk   Phase Initial Discharge    Distance 615 feet 820 feet    Distance % Change -- 33.3 %    Distance Feet Change -- 205 ft    Walk Time 4 minutes 6 minutes    # of Rest Breaks 2 0    MPH 1.75 1.56    METS 1.39 1.13    RPE 14 15    Perceived Dyspnea  0 1    VO2 Peak 4.86 3.95    Symptoms Yes (comment) Yes (comment)    Comments leg fatigue SOB    Resting HR 78 bpm 72 bpm    Resting BP 130/74 128/72    Resting Oxygen Saturation  97 % 97 %    Exercise Oxygen Saturation  during 6 min walk 97 % 94 %    Max Ex. HR 720 bpm 101 bpm    Max Ex. BP 170/80 126/64    2 Minute Post BP 130/70 --             Oxygen Initial Assessment:   Oxygen Re-Evaluation:   Oxygen Discharge (Final Oxygen Re-Evaluation):   Initial  Exercise Prescription:  Initial Exercise Prescription - 06/26/22 1500       Date of Initial Exercise RX and Referring Provider   Date 06/26/22    Referring Provider Arida      Oxygen   Maintain Oxygen Saturation 88% or higher      Treadmill   MPH 0.8    Grade 0    Minutes 15    METs 1.6      Recumbant Bike   Level 1    RPM 50    Minutes 15    METs 1.39      NuStep   Level 1    SPM 80    Minutes 15    METs 1.39      T5 Nustep   Level 1    SPM 80    Minutes 15    METs 1.39      Biostep-RELP   Level 1    SPM 50    Minutes 15    METs 1.39      Track   Laps 5    Minutes 15    METs 1.39      Prescription Details   Frequency (times per week) 3    Duration Progress to 30 minutes of continuous aerobic without signs/symptoms of physical distress      Intensity   THRR 40-80% of Max Heartrate 101-125    Ratings of Perceived Exertion 11-13    Perceived Dyspnea 0-4      Progression   Progression Continue to progress workloads to maintain intensity without signs/symptoms of physical distress.      Resistance Training   Training Prescription Yes    Weight 2 on left/ROM on right    Reps 10-15             Perform Capillary Blood Glucose checks as needed.  Exercise Prescription Changes:   Exercise Prescription Changes     Row Name 06/26/22 1500 07/24/22 1500 08/01/22 1400 08/15/22 1400 08/30/22 1600     Response to Exercise   Blood Pressure (Admit)  130/74 -- 126/62 118/76 112/62   Blood Pressure (Exercise) 170/80 -- 148/68 160/64 --   Blood Pressure (Exit) 130/70 -- 118/60 126/74 110/60   Heart Rate (Admit) 78 bpm -- 82 bpm 79 bpm 78 bpm   Heart Rate (Exercise) 120 bpm -- 120 bpm 94 bpm 119 bpm   Heart Rate (Exit) 88 bpm -- 81 bpm 79 bpm 75 bpm   Oxygen Saturation (Admit) 97 % -- -- 94 % 94 %   Oxygen Saturation (Exercise) 97 % -- -- 95 % 94 %   Oxygen Saturation (Exit) 98 % -- -- 93 % 96 %   Rating of Perceived Exertion (Exercise) 14 -- _0 Perceived Dyspnea (Exercise) 0 -- -- -- --   Symptoms fatigue -- none none none   Comments 6 MWT results -- -- -- --   Duration -- -- Continue with 30 min of aerobic exercise without signs/symptoms of physical distress. Continue with 30 min of aerobic exercise without signs/symptoms of physical distress. Continue with 30 min of aerobic exercise without signs/symptoms of physical distress.   Intensity -- -- THRR unchanged THRR unchanged THRR unchanged     Progression   Progression -- -- Continue to progress workloads to maintain intensity without signs/symptoms of physical distress. Continue to progress workloads to maintain intensity without signs/symptoms of physical distress. Continue to progress workloads to maintain intensity without signs/symptoms of physical distress.   Average METs -- -- 2.19 2.37 2.81     Resistance Training   Training Prescription -- -- Yes Yes Yes   Weight -- -- 2 lb on left, 1 lb on right 2 lb 2 lb   Reps -- -- 10-15 10-15 10-15     Interval Training   Interval Training -- -- No No No     Treadmill   MPH -- -- 1.3 -- 1.5   Grade -- -- 0.5 -- 0   Minutes -- -- 15 -- 15   METs -- -- 2.08 -- 2.15     Recumbant Bike   Level -- -- 2 2 2.1   Minutes -- -- _1 METs -- -- 2.5 -- 3.21     NuStep   Level -- -- _2 Minutes -- -- _3 METs -- -- 2.1 3.1 3.1     T5 Nustep   Level -- -- 1 2 --   Minutes -- -- 15 15 --   METs -- -- 1.7 2 --     Biostep-RELP   Level -- -- -- 2 2   Minutes -- -- -- 15 15   METs -- -- -- 2 3     Home Exercise Plan   Plans to continue exercise at -- Home (comment)  walking, staff videos Home (comment)  walking, staff videos Home (comment)  walking, staff videos Home (comment)  walking, staff videos   Frequency -- Add 2 additional days to program exercise sessions.  start with 1 Add 2 additional days to program exercise sessions.  start with 1 Add 2 additional days to program exercise sessions.  start with 1 Add  2 additional days to program exercise sessions.  start with 1   Initial Home Exercises Provided -- 07/24/22 07/24/22 07/24/22 07/24/22     Oxygen   Maintain Oxygen Saturation -- -- 88% or higher 88% or higher 88% or higher    Row Name 09/26/22 1400  Response to Exercise   Blood Pressure (Admit) 128/72       Blood Pressure (Exit) 124/62       Heart Rate (Admit) 72 bpm       Heart Rate (Exercise) 101 bpm       Heart Rate (Exit) 82 bpm       Oxygen Saturation (Admit) 94 %       Oxygen Saturation (Exercise) 94 %       Oxygen Saturation (Exit) 95 %       Rating of Perceived Exertion (Exercise) 15       Symptoms none       Duration Continue with 30 min of aerobic exercise without signs/symptoms of physical distress.       Intensity THRR unchanged         Progression   Progression Continue to progress workloads to maintain intensity without signs/symptoms of physical distress.       Average METs 2.18         Resistance Training   Training Prescription Yes       Weight 2 lb       Reps 10-15         Interval Training   Interval Training No         Treadmill   MPH 1.5       Grade 0       Minutes 15       METs 2.15         NuStep   Level 3       Minutes 15       METs 2.2         Biostep-RELP   Level 2       Minutes 15         Home Exercise Plan   Plans to continue exercise at Home (comment)  walking, staff videos       Frequency Add 2 additional days to program exercise sessions.  start with 1       Initial Home Exercises Provided 07/24/22         Oxygen   Maintain Oxygen Saturation 88% or higher                Exercise Comments:   Exercise Goals and Review:   Exercise Goals     Row Name 06/26/22 1511             Exercise Goals   Increase Physical Activity Yes       Intervention Provide advice, education, support and counseling about physical activity/exercise needs.;Develop an individualized exercise prescription for aerobic and  resistive training based on initial evaluation findings, risk stratification, comorbidities and participant's personal goals.       Expected Outcomes Short Term: Attend rehab on a regular basis to increase amount of physical activity.;Long Term: Add in home exercise to make exercise part of routine and to increase amount of physical activity.;Long Term: Exercising regularly at least 3-5 days a week.       Increase Strength and Stamina Yes       Intervention Provide advice, education, support and counseling about physical activity/exercise needs.;Develop an individualized exercise prescription for aerobic and resistive training based on initial evaluation findings, risk stratification, comorbidities and participant's personal goals.       Expected Outcomes Short Term: Increase workloads from initial exercise prescription for resistance, speed, and METs.;Short Term: Perform resistance training exercises routinely during rehab and add in resistance training at home;Long Term:  Improve cardiorespiratory fitness, muscular endurance and strength as measured by increased METs and functional capacity (6MWT)       Able to understand and use rate of perceived exertion (RPE) scale Yes       Intervention Provide education and explanation on how to use RPE scale       Expected Outcomes Short Term: Able to use RPE daily in rehab to express subjective intensity level;Long Term:  Able to use RPE to guide intensity level when exercising independently       Able to understand and use Dyspnea scale Yes       Intervention Provide education and explanation on how to use Dyspnea scale       Expected Outcomes Short Term: Able to use Dyspnea scale daily in rehab to express subjective sense of shortness of breath during exertion;Long Term: Able to use Dyspnea scale to guide intensity level when exercising independently       Knowledge and understanding of Target Heart Rate Range (THRR) Yes       Intervention Provide education and  explanation of THRR including how the numbers were predicted and where they are located for reference       Expected Outcomes Short Term: Able to state/look Hart THRR;Short Term: Able to use daily as guideline for intensity in rehab;Long Term: Able to use THRR to govern intensity when exercising independently       Able to check pulse independently Yes       Intervention Provide education and demonstration on how to check pulse in carotid and radial arteries.;Review the importance of being able to check your own pulse for safety during independent exercise       Expected Outcomes Short Term: Able to explain why pulse checking is important during independent exercise;Long Term: Able to check pulse independently and accurately       Understanding of Exercise Prescription Yes       Intervention Provide education, explanation, and written materials on patient's individual exercise prescription       Expected Outcomes Short Term: Able to explain program exercise prescription;Long Term: Able to explain home exercise prescription to exercise independently                Exercise Goals Re-Evaluation :  Exercise Goals Re-Evaluation     Row Name 07/24/22 1525 08/01/22 1501 08/15/22 1438 08/30/22 1617 09/13/22 1327     Exercise Goal Re-Evaluation   Exercise Goals Review Increase Physical Activity;Increase Strength and Stamina;Able to understand and use rate of perceived exertion (RPE) scale;Knowledge and understanding of Target Heart Rate Range (THRR);Understanding of Exercise Prescription;Able to understand and use Dyspnea scale;Able to check pulse independently Increase Physical Activity;Increase Strength and Stamina;Understanding of Exercise Prescription Increase Physical Activity;Increase Strength and Stamina;Understanding of Exercise Prescription Increase Physical Activity;Increase Strength and Stamina;Understanding of Exercise Prescription Increase Physical Activity;Increase Strength and  Stamina;Understanding of Exercise Prescription   Comments Andrea Hart is doing well in rehab. We talked about exercise at home and she has not thought much about it. She has been walking Hart and down her driveway and she was encouraged to increase her duration and keep that Hart. She is also interested in doing the staff videos. Her daughter uses a recumbent bike and she is thinking of trying it as long as her knee feels OK with it. Reviewed home exercise with pt today.  Reviewed THR, pulse, RPE, sign and symptoms, pulse oximetery and when to call 911 or MD.  Also discussed weather considerations and indoor  options.  Pt voiced understanding. Andrea Hart is doing well in rehab. She increased her overall average MET level to 2.19 METs. She also increased her workload on the treadmill to a speed of 1.3 mph and an incline of 0.5%. She also improved to level 2 on the recumbent bike and level 3 on the T4. She has also been using 2 lb for her right arm and 1 lb for her left arm during resistance training. We will continue to monitor her progress in the program. Andrea Hart is doing well in rehab.  She is Hart to 3.1 METs on the NuStep and level 2 on the BioStep.  We will continue to monitor her progress. Andrea Hart continues to do well in rehab. She is due for her psot 6MWT and will look to improve on that. She also improved her overall average MET level to 2.81 METs. She increased her speed on the treadmill as well to 1.5 mph. We will continue to monitor her progress in the program. Andrea Hart has not been to rehab since last review. She has not been able to get transportation to her sessions. We will continue to follow Hart with transportation in hopes her transportation gets figured out soon. She is still due for her post 6MWT.   Expected Outcomes Short: Build Hart progressively to 30 minutes of exercise at once Long: Continue to exercise at home independently Short: Continue to increase workload on treadmill. Long: Continue to increase strength and  stamina. Short: Continue to encourage her to walk more Long: Continue to improve stamina Short: Improve on post 6MWT. Long: Continue to improve strength and stamina Short: Return to rehab and maintain consistent attendance Long: Graduate from [facility] Name 09/18/22 1417 09/26/22 1437           Exercise Goal Re-Evaluation   Exercise Goals Review Increase Physical Activity;Increase Strength and Stamina;Understanding of Exercise Prescription Increase Physical Activity;Increase Strength and Stamina;Understanding of Exercise Prescription      Comments Andrea Hart returned to rehab and expressed that she was very happy to be back and have her transportation worked out. She has 6 more sessions and wants to continue to work on her goals during this time. She states that she really enjoyed the program and it helped her physically as well as mentally after her heart event. She plans to either join the [facility] with her daughter or use her exercise bike at home for exercise after graduation. Andrea Hart returned to the program last week and did well with her exercise. She recently completed her post 6MWT and improved by 33.3%! Shehas been walking more on the treadmill and has tolerated a speed of 1.5 mph. We will continue to monitor her progress until she graduates from the program.      Expected Outcomes Short: improve by at least 10% on post 6 MWT and graduate from program. Long: Maintain independent exercise program upon graduation. Short: Graduate. Long: Continue to exercise independently.               Discharge Exercise Prescription (Final Exercise Prescription Changes):  Exercise Prescription Changes - 09/26/22 1400       Response to Exercise   Blood Pressure (Admit) 128/72    Blood Pressure (Exit) 124/62    Heart Rate (Admit) 72 bpm    Heart Rate (Exercise) 101 bpm    Heart Rate (Exit) 82 bpm    Oxygen Saturation (Admit) 94 %    Oxygen Saturation (Exercise) 94 %    Oxygen  Saturation (Exit)  95 %    Rating of Perceived Exertion (Exercise) 15    Symptoms none    Duration Continue with 30 min of aerobic exercise without signs/symptoms of physical distress.    Intensity THRR unchanged      Progression   Progression Continue to progress workloads to maintain intensity without signs/symptoms of physical distress.    Average METs 2.18      Resistance Training   Training Prescription Yes    Weight 2 lb    Reps 10-15      Interval Training   Interval Training No      Treadmill   MPH 1.5    Grade 0    Minutes 15    METs 2.15      NuStep   Level 3    Minutes 15    METs 2.2      Biostep-RELP   Level 2    Minutes 15      Home Exercise Plan   Plans to continue exercise at Home (comment)   walking, staff videos   Frequency Add 2 additional days to program exercise sessions.   start with 1   Initial Home Exercises Provided 07/24/22      Oxygen   Maintain Oxygen Saturation 88% or higher             Nutrition:  Target Goals: Understanding of nutrition guidelines, daily intake of sodium <1529m, cholesterol <2045m calories 30% from fat and 7% or less from saturated fats, daily to have 5 or more servings of fruits and vegetables.  Education: All About Nutrition: -Group instruction provided by verbal, written material, interactive activities, discussions, models, and posters to present general guidelines for heart healthy nutrition including fat, fiber, MyPlate, the role of sodium in heart healthy nutrition, utilization of the nutrition label, and utilization of this knowledge for meal planning. Follow Hart email sent as well. Written material given at graduation. [facility]  Date 06/28/22  [facility] Code 1- Verbalizes Understanding       Biometrics:  Pre Biometrics - 06/26/22 1511       Pre Biometrics   Height 5' (1.524 m)    Weight 141 lb 9.6 oz (64.2 kg)    BMI  (Calculated) 27.65    Single Leg Stand 1.15 seconds             Post Biometrics - 09/22/22 1030        Post  Biometrics   Height 5' (1.524 m)    Weight 140 lb 11.2 oz (63.8 kg)    BMI (Calculated) 27.48    Single Leg Stand 3.1 seconds             Nutrition Therapy Plan and Nutrition Goals:  Nutrition Therapy & Goals - 06/28/22 1553       Nutrition Therapy   Diet Heart healthy, low Na    Protein (specify units) 75g    Fiber 20 grams    Whole Grain Foods 1 servings   ideal 3, but high fiber foods tend to bother her GI   Saturated Fats 12 max. grams    Fruits and Vegetables 5 servings/day   ideal 8, but high fiber foods tend to bother her GI   Sodium 2 grams      Personal Nutrition Goals   Nutrition Goal ST: add protein to salad for lunch; leftover meat/fish/poultry from night before, boiled  eggs, beans/lentils, nuts/seeds LT: meet protein needs, improve energy    Comments 86 y.o. F admitted to cardiac rehab for s/p coronary artery stent placement. PMHx includes HTN, CAD, GERD, hypothyroidism, CKD, HLD, prediabetes, anemia, and vit D deficiency. Relevant medications inlcudes synthroid, robaxin, omeprazole, crestor, tumeric 500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

## 2022-10-06 ENCOUNTER — Encounter: Payer: Medicare Other | Attending: Cardiovascular Disease | Admitting: *Deleted

## 2022-10-06 DIAGNOSIS — Z955 Presence of coronary angioplasty implant and graft: Secondary | ICD-10-CM | POA: Insufficient documentation

## 2022-10-06 NOTE — Progress Notes (Signed)
Daily Session Note  Patient Details  Name: Andrea Hart MRN: [id number] Date of Birth: Feb 26, 1936 Referring Provider:   Flowsheet Row Cardiac Rehab from 06/26/2022 in [facility]  Referring Provider Fletcher Anon       Encounter Date: 10/06/2022  Check In:  Session Check In - 10/06/22 1014       Check-In   Supervising physician immediately available to respond to emergencies See telemetry face sheet for immediately available ER MD    Location [facility]-Cardiac [facility] Pulmonary [facility]    Staff Present Heath Lark, RN, BSN, CCRP;Jessica Eden, MA, RCEP, CCRP, CCET;Joseph [place], [place]    Virtual Visit No    Medication changes reported     No    Fall or balance concerns reported    No    Warm-up and Cool-down Performed on first and last piece of equipment    Resistance Training Performed Yes    VAD Patient? No    PAD/SET Patient? No      Pain Assessment   Currently in Pain? No/denies                Social History   Tobacco Use  Smoking Status Never  Smokeless Tobacco Never    Goals Met:  Independence with exercise equipment Exercise tolerated well Personal goals reviewed No report of concerns or symptoms today  Goals Unmet:  Not Applicable  Comments:  Tawania graduated today from  rehab with 35 sessions completed.  Details of the patient's exercise prescription and what She needs to do in order to continue the prescription and progress were discussed with patient.  Patient was given a copy of prescription and goals.  Patient verbalized understanding.  Sheyann plans to continue to exercise by using exercise equipment at home..    Dr. Emily Filbert is Medical Director for [place].  Dr. Ottie Glazier is Medical Director for [facility] Pulmonary Rehabilitation.

## 2022-10-06 NOTE — Progress Notes (Signed)
Cardiac Individual Treatment Plan  Patient Details  Name: Andrea Hart MRN: [id number] Date of Birth: [birth year] Referring Provider:   Flowsheet Row Cardiac Rehab from 06/26/2022 in [facility] Cardiac and Pulmonary Rehab  Referring Provider Arida       Initial Encounter Date:  Flowsheet Row Cardiac Rehab from 06/26/2022 in [facility] Cardiac and Pulmonary Rehab  Date 06/26/22       Visit Diagnosis: Status post coronary artery stent placement  Patient's Home Medications on Admission:  Current Outpatient Medications:    albuterol (VENTOLIN HFA) 108 (90 Base) MCG/ACT inhaler, Inhale 2 puffs into the lungs every 4 (four) hours as needed for wheezing or shortness of breath., Disp: 6.7 g, Rfl: 11   amLODipine (NORVASC) 10 MG tablet, TAKE 1 TABLET BY MOUTH EVERY DAY, Disp: 90 tablet, Rfl: 1   aspirin EC 81 MG tablet, Take 81 mg by mouth daily., Disp: , Rfl:    budesonide-formoterol (SYMBICORT) 160-4.5 MCG/ACT inhaler, Inhale 2 puffs into the lungs 2 (two) times daily., Disp: 3 each, Rfl: 3   cetirizine (ZYRTEC) 10 MG tablet, Take 1 tablet (10 mg total) by mouth daily., Disp: 90 tablet, Rfl: 3   clopidogrel (PLAVIX) 75 MG tablet, Take 1 tablet (75 mg total) by mouth daily., Disp: 30 tablet, Rfl: 5   fluticasone (FLONASE) 50 MCG/ACT nasal spray, Place 2 sprays into both nostrils daily., Disp: 16 g, Rfl: 6   levothyroxine (SYNTHROID) 75 MCG tablet, Take 1 tablet (75 mcg total) by mouth daily before breakfast., Disp: 30 tablet, Rfl: 5   meclizine (ANTIVERT) 25 MG tablet, Take 0.5 tablets (12.5 mg total) by mouth 3 (three) times daily as needed for dizziness or nausea., Disp: 30 tablet, Rfl: 1   methocarbamol (ROBAXIN) 500 MG tablet, Take 1 tablet (500 mg total) by mouth at bedtime as needed for muscle spasms., Disp: 90 tablet, Rfl: 0   montelukast (SINGULAIR) 10 MG tablet, Take 1 tablet (10 mg total) by mouth at bedtime., Disp: 30 tablet, Rfl: 5   omeprazole (PRILOSEC) 40 MG capsule, Take 1 capsule (40 mg  total) by mouth 2 (two) times daily., Disp: 60 capsule, Rfl: 5   rosuvastatin (CRESTOR) 20 MG tablet, TAKE 1 TABLET BY MOUTH EVERY DAY, Disp: 90 tablet, Rfl: 1   spironolactone (ALDACTONE) 25 MG tablet, Take 0.5 tablets (12.5 mg total) by mouth daily., Disp: 15 tablet, Rfl: 5   spironolactone (ALDACTONE) 25 MG tablet, Take 12.5 mg by mouth daily., Disp: , Rfl:    Turmeric 500 MG TABS, Take 1,000 mg by mouth daily., Disp: , Rfl:    valsartan (DIOVAN) 320 MG tablet, Take 1 tablet (320 mg total) by mouth daily., Disp: 30 tablet, Rfl: 5  Past Medical History: Past Medical History:  Diagnosis Date   Abdominal hernia    Arthritis    Asthma    Carotid arterial disease ([place])    a. 11/2020 Carotid U/S: Less than 70% RICA, 48-88% LICA.   CKD (chronic kidney disease), stage III ([place])    Coronary artery disease    a. 2013 s/p PCI and stent placement in [facility] done for stable angina.   GI bleeding 02/02/2021   Hyperlipidemia    Hypertension    Hypothyroidism    PVC's (premature ventricular contractions)    a. 01/2020 Zio: RSR, 70, frequent PVCs with 10.5% burden.   Stroke [facility])    a. 2017 - ILR did not show afib.    Tobacco Use: Social History   Tobacco Use  Smoking Status Never  Smokeless Tobacco Never    Labs: Review Flowsheet  More data exists      Latest Ref Rng & Units 12/19/2019 08/16/2020 07/21/2021 01/23/2022 07/17/2022  Labs for ITP Cardiac and Pulmonary Rehab  Cholestrol 100 - 199 mg/dL 183  145  137  - 160   LDL (calc) 0 - 99 mg/dL 87  65  64  - 70   HDL-C >39 mg/dL 59.60  60  50  - 69   Trlycerides 0 - 149 mg/dL 182.0  111  128  - 122   Hemoglobin A1c 4.8 - 5.6 % - - 6.3  6.0  6.0      Exercise Target Goals: Exercise Program Goal: Individual exercise prescription set using results from initial 6 min walk test and THRR while considering  patient's activity barriers and safety.   Exercise Prescription Goal: Initial exercise prescription builds to 30-45  minutes a day of aerobic activity, 2-3 days per week.  Home exercise guidelines will be given to patient during program as part of exercise prescription that the participant will acknowledge.   Education: Aerobic Exercise: - Group verbal and visual presentation on the components of exercise prescription. Introduces F.I.T.T principle from ACSM for exercise prescriptions.  Reviews F.I.T.T. principles of aerobic exercise including progression. Written material given at graduation. Flowsheet Row Cardiac Rehab from 08/16/2022 in [facility] Cardiac and Pulmonary Rehab  Education need identified 06/26/22  Date 08/09/22  Educator [place]  Instruction Review Code 1- Verbalizes Understanding       Education: Resistance Exercise: - Group verbal and visual presentation on the components of exercise prescription. Introduces F.I.T.T principle from ACSM for exercise prescriptions  Reviews F.I.T.T. principles of resistance exercise including progression. Written material given at graduation. Flowsheet Row Cardiac Rehab from 08/16/2022 in [facility] Cardiac and Pulmonary Rehab  Date 08/16/22  Educator NT  Instruction Review Code 1- Verbalizes Understanding        Education: Exercise & Equipment Safety: - Individual verbal instruction and demonstration of equipment use and safety with use of the equipment. Flowsheet Row Cardiac Rehab from 08/16/2022 in [facility] Cardiac and Pulmonary Rehab  Date 06/26/22  Educator [facility]  Instruction Review Code 1- Verbalizes Understanding       Education: Exercise Physiology & General Exercise Guidelines: - Group verbal and written instruction with models to review the exercise physiology of the cardiovascular system and associated critical values. Provides general exercise guidelines with specific guidelines to those with heart or lung disease.  Flowsheet Row Cardiac Rehab from 08/16/2022 in [facility] Cardiac and Pulmonary Rehab  Date 08/02/22  Educator kl  Instruction Review Code 1- [employer]  Understanding       Education: Flexibility, Balance, Mind/Body Relaxation: - Group verbal and visual presentation with interactive activity on the components of exercise prescription. Introduces F.I.T.T principle from ACSM for exercise prescriptions. Reviews F.I.T.T. principles of flexibility and balance exercise training including progression. Also discusses the mind body connection.  Reviews various relaxation techniques to help reduce and manage stress (i.e. Deep breathing, progressive muscle relaxation, and visualization). Balance handout provided to take home. Written material given at graduation. Flowsheet Row Cardiac Rehab from 08/16/2022 in [facility] Cardiac and Pulmonary Rehab  Date 08/16/22  Educator NT  Instruction Review Code 1- Verbalizes Understanding       Activity Barriers & Risk Stratification:  Activity Barriers & Cardiac Risk Stratification - 06/19/22 1428       Activity Barriers & Cardiac Risk Stratification   Activity Barriers Right Knee Replacement;Left  Knee Replacement;Right Hip Replacement    Cardiac Risk Stratification Moderate             6 Minute Walk:  6 Minute Walk     Row Name 06/26/22 1504 09/22/22 1029       6 Minute Walk   Phase Initial Discharge    Distance 615 feet 820 feet    Distance % Change -- 33.3 %    Distance Feet Change -- 205 ft    Walk Time 4 minutes 6 minutes    # of Rest Breaks 2 0    MPH 1.75 1.56    METS 1.39 1.13    RPE 14 15    Perceived Dyspnea  0 1    VO2 Peak 4.86 3.95    Symptoms Yes (comment) Yes (comment)    Comments leg fatigue SOB    Resting HR 78 bpm 72 bpm    Resting BP 130/74 128/72    Resting Oxygen Saturation  97 % 97 %    Exercise Oxygen Saturation  during 6 min walk 97 % 94 %    Max Ex. HR 720 bpm 101 bpm    Max Ex. BP 170/80 126/64    2 Minute Post BP 130/70 --             Oxygen Initial Assessment:   Oxygen Re-Evaluation:   Oxygen Discharge (Final Oxygen Re-Evaluation):   Initial  Exercise Prescription:  Initial Exercise Prescription - 06/26/22 1500       Date of Initial Exercise RX and Referring Provider   Date 06/26/22    Referring Provider Arida      Oxygen   Maintain Oxygen Saturation 88% or higher      Treadmill   MPH 0.8    Grade 0    Minutes 15    METs 1.6      Recumbant Bike   Level 1    RPM 50    Minutes 15    METs 1.39      NuStep   Level 1    SPM 80    Minutes 15    METs 1.39      T5 Nustep   Level 1    SPM 80    Minutes 15    METs 1.39      Biostep-RELP   Level 1    SPM 50    Minutes 15    METs 1.39      Track   Laps 5    Minutes 15    METs 1.39      Prescription Details   Frequency (times per week) 3    Duration Progress to 30 minutes of continuous aerobic without signs/symptoms of physical distress      Intensity   THRR 40-80% of Max Heartrate 101-125    Ratings of Perceived Exertion 11-13    Perceived Dyspnea 0-4      Progression   Progression Continue to progress workloads to maintain intensity without signs/symptoms of physical distress.      Resistance Training   Training Prescription Yes    Weight 2 on left/ROM on right    Reps 10-15             Perform Capillary Blood Glucose checks as needed.  Exercise Prescription Changes:   Exercise Prescription Changes     Row Name 06/26/22 1500 07/24/22 1500 08/01/22 1400 08/15/22 1400 08/30/22 1600     Response to Exercise   Blood Pressure (Admit)  130/74 -- 126/62 118/76 112/62   Blood Pressure (Exercise) 170/80 -- 148/68 160/64 --   Blood Pressure (Exit) 130/70 -- 118/60 126/74 110/60   Heart Rate (Admit) 78 bpm -- 82 bpm 79 bpm 78 bpm   Heart Rate (Exercise) 120 bpm -- 120 bpm 94 bpm 119 bpm   Heart Rate (Exit) 88 bpm -- 81 bpm 79 bpm 75 bpm   Oxygen Saturation (Admit) 97 % -- -- 94 % 94 %   Oxygen Saturation (Exercise) 97 % -- -- 95 % 94 %   Oxygen Saturation (Exit) 98 % -- -- 93 % 96 %   Rating of Perceived Exertion (Exercise) 14 -- _0 Perceived Dyspnea (Exercise) 0 -- -- -- --   Symptoms fatigue -- none none none   Comments 6 MWT results -- -- -- --   Duration -- -- Continue with 30 min of aerobic exercise without signs/symptoms of physical distress. Continue with 30 min of aerobic exercise without signs/symptoms of physical distress. Continue with 30 min of aerobic exercise without signs/symptoms of physical distress.   Intensity -- -- THRR unchanged THRR unchanged THRR unchanged     Progression   Progression -- -- Continue to progress workloads to maintain intensity without signs/symptoms of physical distress. Continue to progress workloads to maintain intensity without signs/symptoms of physical distress. Continue to progress workloads to maintain intensity without signs/symptoms of physical distress.   Average METs -- -- 2.19 2.37 2.81     Resistance Training   Training Prescription -- -- Yes Yes Yes   Weight -- -- 2 lb on left, 1 lb on right 2 lb 2 lb   Reps -- -- 10-15 10-15 10-15     Interval Training   Interval Training -- -- No No No     Treadmill   MPH -- -- 1.3 -- 1.5   Grade -- -- 0.5 -- 0   Minutes -- -- 15 -- 15   METs -- -- 2.08 -- 2.15     Recumbant Bike   Level -- -- 2 2 2.1   Minutes -- -- _1 METs -- -- 2.5 -- 3.21     NuStep   Level -- -- _2 Minutes -- -- _3 METs -- -- 2.1 3.1 3.1     T5 Nustep   Level -- -- 1 2 --   Minutes -- -- 15 15 --   METs -- -- 1.7 2 --     Biostep-RELP   Level -- -- -- 2 2   Minutes -- -- -- 15 15   METs -- -- -- 2 3     Home Exercise Plan   Plans to continue exercise at -- Home (comment)  walking, staff videos Home (comment)  walking, staff videos Home (comment)  walking, staff videos Home (comment)  walking, staff videos   Frequency -- Add 2 additional days to program exercise sessions.  start with 1 Add 2 additional days to program exercise sessions.  start with 1 Add 2 additional days to program exercise sessions.  start with 1 Add  2 additional days to program exercise sessions.  start with 1   Initial Home Exercises Provided -- 07/24/22 07/24/22 07/24/22 07/24/22     Oxygen   Maintain Oxygen Saturation -- -- 88% or higher 88% or higher 88% or higher    Row Name 09/26/22 1400  Response to Exercise   Blood Pressure (Admit) 128/72       Blood Pressure (Exit) 124/62       Heart Rate (Admit) 72 bpm       Heart Rate (Exercise) 101 bpm       Heart Rate (Exit) 82 bpm       Oxygen Saturation (Admit) 94 %       Oxygen Saturation (Exercise) 94 %       Oxygen Saturation (Exit) 95 %       Rating of Perceived Exertion (Exercise) 15       Symptoms none       Duration Continue with 30 min of aerobic exercise without signs/symptoms of physical distress.       Intensity THRR unchanged         Progression   Progression Continue to progress workloads to maintain intensity without signs/symptoms of physical distress.       Average METs 2.18         Resistance Training   Training Prescription Yes       Weight 2 lb       Reps 10-15         Interval Training   Interval Training No         Treadmill   MPH 1.5       Grade 0       Minutes 15       METs 2.15         NuStep   Level 3       Minutes 15       METs 2.2         Biostep-RELP   Level 2       Minutes 15         Home Exercise Plan   Plans to continue exercise at Home (comment)  walking, staff videos       Frequency Add 2 additional days to program exercise sessions.  start with 1       Initial Home Exercises Provided 07/24/22         Oxygen   Maintain Oxygen Saturation 88% or higher                Exercise Comments:   Exercise Comments     Row Name 10/06/22 1016           Exercise Comments Makenley graduated today from  rehab with 35 sessions completed.  Details of the patient's exercise prescription and what She needs to do in order to continue the prescription and progress were discussed with patient.  Patient was given a copy  of prescription and goals.  Patient verbalized understanding.  Shaindel plans to continue to exercise by using exercise equipment at home..                Exercise Goals and Review:   Exercise Goals     Row Name 06/26/22 1511             Exercise Goals   Increase Physical Activity Yes       Intervention Provide advice, education, support and counseling about physical activity/exercise needs.;Develop an individualized exercise prescription for aerobic and resistive training based on initial evaluation findings, risk stratification, comorbidities and participant's personal goals.       Expected Outcomes Short Term: Attend rehab on a regular basis to increase amount of physical activity.;Long Term: Add in home exercise to make exercise part of routine and to increase  amount of physical activity.;Long Term: Exercising regularly at least 3-5 days a week.       Increase Strength and Stamina Yes       Intervention Provide advice, education, support and counseling about physical activity/exercise needs.;Develop an individualized exercise prescription for aerobic and resistive training based on initial evaluation findings, risk stratification, comorbidities and participant's personal goals.       Expected Outcomes Short Term: Increase workloads from initial exercise prescription for resistance, speed, and METs.;Short Term: Perform resistance training exercises routinely during rehab and add in resistance training at home;Long Term: Improve cardiorespiratory fitness, muscular endurance and strength as measured by increased METs and functional capacity (6MWT)       Able to understand and use rate of perceived exertion (RPE) scale Yes       Intervention Provide education and explanation on how to use RPE scale       Expected Outcomes Short Term: Able to use RPE daily in rehab to express subjective intensity level;Long Term:  Able to use RPE to guide intensity level when exercising independently        Able to understand and use Dyspnea scale Yes       Intervention Provide education and explanation on how to use Dyspnea scale       Expected Outcomes Short Term: Able to use Dyspnea scale daily in rehab to express subjective sense of shortness of breath during exertion;Long Term: Able to use Dyspnea scale to guide intensity level when exercising independently       Knowledge and understanding of Target Heart Rate Range (THRR) Yes       Intervention Provide education and explanation of THRR including how the numbers were predicted and where they are located for reference       Expected Outcomes Short Term: Able to state/look up THRR;Short Term: Able to use daily as guideline for intensity in rehab;Long Term: Able to use THRR to govern intensity when exercising independently       Able to check pulse independently Yes       Intervention Provide education and demonstration on how to check pulse in carotid and radial arteries.;Review the importance of being able to check your own pulse for safety during independent exercise       Expected Outcomes Short Term: Able to explain why pulse checking is important during independent exercise;Long Term: Able to check pulse independently and accurately       Understanding of Exercise Prescription Yes       Intervention Provide education, explanation, and written materials on patient's individual exercise prescription       Expected Outcomes Short Term: Able to explain program exercise prescription;Long Term: Able to explain home exercise prescription to exercise independently                Exercise Goals Re-Evaluation :  Exercise Goals Re-Evaluation     Row Name 07/24/22 1525 08/01/22 1501 08/15/22 1438 08/30/22 1617 09/13/22 1327     Exercise Goal Re-Evaluation   Exercise Goals Review Increase Physical Activity;Increase Strength and Stamina;Able to understand and use rate of perceived exertion (RPE) scale;Knowledge and understanding of Target Heart Rate  Range (THRR);Understanding of Exercise Prescription;Able to understand and use Dyspnea scale;Able to check pulse independently Increase Physical Activity;Increase Strength and Stamina;Understanding of Exercise Prescription Increase Physical Activity;Increase Strength and Stamina;Understanding of Exercise Prescription Increase Physical Activity;Increase Strength and Stamina;Understanding of Exercise Prescription Increase Physical Activity;Increase Strength and Stamina;Understanding of Exercise Prescription   Comments Chennel is doing  well in rehab. We talked about exercise at home and she has not thought much about it. She has been walking up and down her driveway and she was encouraged to increase her duration and keep that up. She is also interested in doing the staff videos. Her daughter uses a recumbent bike and she is thinking of trying it as long as her knee feels OK with it. Reviewed home exercise with pt today.  Reviewed THR, pulse, RPE, sign and symptoms, pulse oximetery and when to call 911 or MD.  Also discussed weather considerations and indoor options.  Pt voiced understanding. Whitney is doing well in rehab. She increased her overall average MET level to 2.19 METs. She also increased her workload on the treadmill to a speed of 1.3 mph and an incline of 0.5%. She also improved to level 2 on the recumbent bike and level 3 on the T4. She has also been using 2 lb for her right arm and 1 lb for her left arm during resistance training. We will continue to monitor her progress in the program. Cherica is doing well in rehab.  She is up to 3.1 METs on the NuStep and level 2 on the BioStep.  We will continue to monitor her progress. Evana continues to do well in rehab. She is due for her psot 6MWT and will look to improve on that. She also improved her overall average MET level to 2.81 METs. She increased her speed on the treadmill as well to 1.5 mph. We will continue to monitor her progress in the program. Anndee  has not been to rehab since last review. She has not been able to get transportation to her sessions. We will continue to follow up with transportation in hopes her transportation gets figured out soon. She is still due for her post 6MWT.   Expected Outcomes Short: Build up progressively to 30 minutes of exercise at once Long: Continue to exercise at home independently Short: Continue to increase workload on treadmill. Long: Continue to increase strength and stamina. Short: Continue to encourage her to walk more Long: Continue to improve stamina Short: Improve on post 6MWT. Long: Continue to improve strength and stamina Short: Return to rehab and maintain consistent attendance Long: Graduate from [facility] Name 09/18/22 1417 09/26/22 1437           Exercise Goal Re-Evaluation   Exercise Goals Review Increase Physical Activity;Increase Strength and Stamina;Understanding of Exercise Prescription Increase Physical Activity;Increase Strength and Stamina;Understanding of Exercise Prescription      Comments Adena returned to rehab and expressed that she was very happy to be back and have her transportation worked out. She has 6 more sessions and wants to continue to work on her goals during this time. She states that she really enjoyed the program and it helped her physically as well as mentally after her heart event. She plans to either join the [facility] with her daughter or use her exercise bike at home for exercise after graduation. Mariaguadalupe returned to the program last week and did well with her exercise. She recently completed her post 6MWT and improved by 33.3%! Shehas been walking more on the treadmill and has tolerated a speed of 1.5 mph. We will continue to monitor her progress until she graduates from the program.      Expected Outcomes Short: improve by at least 10% on post 6 MWT and graduate from program. Long: Maintain independent exercise program upon graduation. Short: Graduate.  Long:  Continue to exercise independently.               Discharge Exercise Prescription (Final Exercise Prescription Changes):  Exercise Prescription Changes - 09/26/22 1400       Response to Exercise   Blood Pressure (Admit) 128/72    Blood Pressure (Exit) 124/62    Heart Rate (Admit) 72 bpm    Heart Rate (Exercise) 101 bpm    Heart Rate (Exit) 82 bpm    Oxygen Saturation (Admit) 94 %    Oxygen Saturation (Exercise) 94 %    Oxygen Saturation (Exit) 95 %    Rating of Perceived Exertion (Exercise) 15    Symptoms none    Duration Continue with 30 min of aerobic exercise without signs/symptoms of physical distress.    Intensity THRR unchanged      Progression   Progression Continue to progress workloads to maintain intensity without signs/symptoms of physical distress.    Average METs 2.18      Resistance Training   Training Prescription Yes    Weight 2 lb    Reps 10-15      Interval Training   Interval Training No      Treadmill   MPH 1.5    Grade 0    Minutes 15    METs 2.15      NuStep   Level 3    Minutes 15    METs 2.2      Biostep-RELP   Level 2    Minutes 15      Home Exercise Plan   Plans to continue exercise at Home (comment)   walking, staff videos   Frequency Add 2 additional days to program exercise sessions.   start with 1   Initial Home Exercises Provided 07/24/22      Oxygen   Maintain Oxygen Saturation 88% or higher             Nutrition:  Target Goals: Understanding of nutrition guidelines, daily intake of sodium <1555m, cholesterol <2021m calories 30% from fat and 7% or less from saturated fats, daily to have 5 or more servings of fruits and vegetables.  Education: All About Nutrition: -Group instruction provided by verbal, written material, interactive activities, discussions, models, and posters to present general guidelines for heart healthy nutrition including fat, fiber, MyPlate, the role of sodium in heart healthy nutrition,  utilization of the nutrition label, and utilization of this knowledge for meal planning. Follow up email sent as well. Written material given at graduation. [facility]  Date 06/28/22  [facility] Hospital[facility]ruction Review Code 1- Verbalizes Understanding       Biometrics:  Pre Biometrics - 06/26/22 1511       Pre Biometrics   Height 5' (1.524 m)    Weight 141 lb 9.6 oz (64.2 kg)    BMI (Calculated) 27.65    Single Leg Stand 1.15 seconds             Post Biometrics - 09/22/22 1030        Post  Biometrics   Height 5' (1.524 m)    Weight 140 lb 11.2 oz (63.8 kg)    BMI (Calculated) 27.48    Single Leg Stand 3.1 seconds             Nutrition Therapy Plan and Nutrition Goals:  Nutrition Therapy & Goals - 06/28/22 1553       Nutrition Therapy  Diet Heart healthy, low Na    Protein (specify units) 75g    Fiber 20 grams    Whole Grain Foods 1 servings   ideal 3, but high fiber foods tend to bother her GI   Saturated Fats 12 max. grams    Fruits and Vegetables 5 servings/day   ideal 8, but high fiber foods tend to bother her GI   Sodium 2 grams      Personal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

## 2022-10-06 NOTE — Progress Notes (Signed)
Discharge Note  Carmon Brigandi  [birth year]   Rilie graduated today from  rehab with 35 sessions completed.  Details of the patient's exercise prescription and what She needs to do in order to continue the prescription and progress were discussed with patient.  Patient was given a copy of prescription and goals.  Patient verbalized understanding.  Caro plans to continue to exercise by using exercise equipment at home..  6 Minute Walk     Row Name 06/26/22 1504 09/22/22 1029       6 Minute Walk   Phase Initial Discharge    Distance 615 feet 820 feet    Distance % Change -- 33.3 %    Distance Feet Change -- 205 ft    Walk Time 4 minutes 6 minutes    # of Rest Breaks 2 0    MPH 1.75 1.56    METS 1.39 1.13    RPE 14 15    Perceived Dyspnea  0 1    VO2 Peak 4.86 3.95    Symptoms Yes (comment) Yes (comment)    Comments leg fatigue SOB    Resting HR 78 bpm 72 bpm    Resting BP 130/74 128/72    Resting Oxygen Saturation  97 % 97 %    Exercise Oxygen Saturation  during 6 min walk 97 % 94 %    Max Ex. HR 720 bpm 101 bpm    Max Ex. BP 170/80 126/64    2 Minute Post BP 130/70 --            Thank you for the referral.

## 2022-10-07 ENCOUNTER — Other Ambulatory Visit: Payer: Self-pay | Admitting: Nurse Practitioner

## 2022-10-07 ENCOUNTER — Other Ambulatory Visit: Payer: Self-pay | Admitting: Family Medicine

## 2022-10-07 DIAGNOSIS — E039 Hypothyroidism, unspecified: Secondary | ICD-10-CM

## 2022-10-07 DIAGNOSIS — I10 Essential (primary) hypertension: Secondary | ICD-10-CM

## 2022-10-07 DIAGNOSIS — E785 Hyperlipidemia, unspecified: Secondary | ICD-10-CM

## 2022-10-08 ENCOUNTER — Other Ambulatory Visit: Payer: Self-pay | Admitting: Nurse Practitioner

## 2022-10-08 ENCOUNTER — Other Ambulatory Visit: Payer: Self-pay | Admitting: Family Medicine

## 2022-10-08 DIAGNOSIS — K219 Gastro-esophageal reflux disease without esophagitis: Secondary | ICD-10-CM

## 2022-10-08 DIAGNOSIS — J4520 Mild intermittent asthma, uncomplicated: Secondary | ICD-10-CM

## 2022-10-08 DIAGNOSIS — I2510 Atherosclerotic heart disease of native coronary artery without angina pectoris: Secondary | ICD-10-CM

## 2022-10-10 ENCOUNTER — Encounter: Payer: Medicare Other | Admitting: Physical Therapy

## 2022-10-17 ENCOUNTER — Encounter: Payer: Medicare Other | Admitting: Physical Therapy

## 2022-10-24 ENCOUNTER — Encounter: Payer: Medicare Other | Admitting: Physical Therapy

## 2022-10-31 ENCOUNTER — Encounter: Payer: Medicare Other | Admitting: Physical Therapy

## 2022-11-05 DIAGNOSIS — Z23 Encounter for immunization: Secondary | ICD-10-CM | POA: Diagnosis not present

## 2022-11-28 NOTE — Progress Notes (Unsigned)
Cardiology Office Note    Date:  11/30/2022   ID:  [name], DOB 10-12-1936, MRN [id number]  PCP:  Virginia Crews, MD  Cardiologist:  Kathlyn Sacramento, MD  Electrophysiologist:  None   Chief Complaint: Follow up  History of Present Illness:   Andrea Hart is a 86 y.o. female with history of CAD status post stenting in 2013 to the RCA status post PCI/DES to the RCA overlapping the previously placed stent on 05/24/2022, CKD stage III, prior CVA x2, HTN, HLD, GI bleeding in 02/2021, PVCs, hypothyroidism, and asthma who presents for follow-up of CAD.   She underwent stenting of an unknown vessel in [place], [place] in 2013.  In 2017, she suffered a stroke.  ILR did not show any evidence of A-fib, and she has been maintained on aspirin and clopidogrel since.  In 01/2016, she complained of presyncope and underwent event monitoring.  This showed frequent PVCs with a 10% burden.  She reported a long history of PVCs and has been managed with beta-blocker therapy.  She has history of difficult to control hypertension with renal artery ultrasound in 07/2020 showing no evidence of RAS.  Carotid artery ultrasound in 11/2020 showed moderate, nonobstructive bilateral left greater than right ICA disease.  She was admitted in 02/2021 with rectal bleeding with GI feeling the presentation was most consistent with an acute diverticular bleed.  Aspirin and clopidogrel were held and she underwent outpatient colonoscopy in 04/2021 which demonstrated nonbleeding internal hemorrhoids, moderate diverticulosis without evidence of diverticular bleeding.  She was subsequently resumed on aspirin and clopidogrel.  She was seen in the office in 02/2022 and was without symptoms of angina or decompensation.  She remained active with multiple social events.  Most recent carotid artery ultrasound from 02/2022 demonstrated 1 to 39% RICA stenosis and 60 to 03% LICA stenosis (previously 50 to 69%).  She was seen on 05/16/2022 noting a 9  to 81-monthhistory of exertional dyspnea that had become more pronounced.  Symptoms felt similar, though not as severe, to her prior angina leading up to PCI in 2013.  She was without frank chest pain.  Given concerning symptoms, she underwent LHC on 05/24/2022 which showed moderately to severely calcified coronary arteries with significant one-vessel CAD.  There was 95% stenosis in the proximal/mid RCA just before the previously placed stent.  LVEDP was upper limit of normal.  She underwent successful PCI/DES to the RCA overlapping with the previously placed stent.  Post-cath labs notable for a hemoglobin of 9.4.  Carvedilol was decreased secondary to bradycardia.     She was seen in the office on 05/30/2022 and was without symptoms of angina or decompensation.  Since undergoing PCI to the RCA, she noted improvement in her dyspnea.  She was frustrated as she continued to be fatigued.  Bradycardia improved following the tapering of carvedilol.  Echo on 06/22/2022 demonstrated an EF of 60 to 65%, no regional wall motion abnormalities, moderate LVH, grade 1 diastolic dysfunction, normal RV systolic function and ventricular cavity size, mildly dilated left atrium, mild mitral regurgitation, aortic valve sclerosis without evidence of stenosis, and an estimated right atrial pressure of 3 mmHg.  She was last seen in the office in 08/2022 and was doing very well from a cardiac perspective, without symptoms of angina or decompensation.  She noted an improvement in her functional status and fatigue with cardiac rehab.  She comes in continuing to do very well from a cardiac perspective and is without  symptoms of angina or decompensation.  She indicates this is the best she has felt in 10 years.  She is now back to living her active lifestyle without cardiac limitation.  No dyspnea, palpitations, dizziness, presyncope, or syncope.  No falls, hematochezia, or melena.  Blood pressure remains well-controlled at home.  She is  adherent and tolerating cardiac medications without issues.  Overall, she is very pleased with her progress.     Labs independently reviewed: 07/2022 - A1c 6.0, TC 160, TG 122, HDL 69, LDL 70, Hgb 11.7, PLT 314, TSH normal, BUN 22, serum creatinine 1.33, potassium 4.3 01/2022 - albumin 4.4, AST/ALT normal   Past Medical History:  Diagnosis Date   Abdominal hernia    Arthritis    Asthma    Carotid arterial disease ([place])    a. 11/2020 Carotid U/S: Less than 32% RICA, 35-57% LICA.   CKD (chronic kidney disease), stage III ([place])    Coronary artery disease    a. 2013 s/p PCI and stent placement in [facility] done for stable angina.   GI bleeding 02/02/2021   Hyperlipidemia    Hypertension    Hypothyroidism    PVC's (premature ventricular contractions)    a. 01/2020 [facility]: RSR, 70, frequent PVCs with 10.5% burden.   Stroke [facility])    a. 2017 - ILR did not show afib.    Past Surgical History:  Procedure Laterality Date   ABDOMINAL HYSTERECTOMY     APPENDECTOMY     CATARACT EXTRACTION Bilateral    CHOLECYSTECTOMY     COLONOSCOPY WITH PROPOFOL N/A 04/06/2021   Procedure: COLONOSCOPY WITH PROPOFOL;  Surgeon: Toledo, Benay Pike, MD;  Location: [facility] ENDOSCOPY;  Service: Gastroenterology;  Laterality: N/A;   CORONARY ANGIOPLASTY  2013   1xStent [facility] [place].   CORONARY STENT INTERVENTION N/A 05/24/2022   Procedure: CORONARY STENT INTERVENTION;  Surgeon: Wellington Hampshire, MD;  Location: [place] [facility];  Service: Cardiovascular;  Laterality: N/A;   EYE SURGERY     HAND SURGERY Right    LEFT HEART CATH AND CORONARY ANGIOGRAPHY Left 05/24/2022   Procedure: LEFT HEART CATH AND CORONARY ANGIOGRAPHY;  Surgeon: Wellington Hampshire, MD;  Location: [place] [facility];  Service: Cardiovascular;  Laterality: Left;   LOOP RECORDER IMPLANT     NISSEN FUNDOPLICATION  3220   REPLACEMENT TOTAL KNEE Bilateral 2017   TOTAL HIP ARTHROPLASTY Right    TOTAL VAGINAL HYSTERECTOMY       Current Medications: Current Meds  Medication Sig   albuterol (VENTOLIN HFA) 108 (90 Base) MCG/ACT inhaler Inhale 2 puffs into the lungs every 4 (four) hours as needed for wheezing or shortness of breath.   amLODipine (NORVASC) 10 MG tablet TAKE 1 TABLET BY MOUTH EVERY DAY   aspirin EC 81 MG tablet Take 81 mg by mouth daily.   budesonide-formoterol (SYMBICORT) 160-4.5 MCG/ACT inhaler Inhale 2 puffs into the lungs 2 (two) times daily.   cetirizine (ZYRTEC) 10 MG tablet Take 1 tablet (10 mg total) by mouth daily.   clopidogrel (PLAVIX) 75 MG tablet TAKE 1 TABLET BY MOUTH EVERY DAY   fluticasone (FLONASE) 50 MCG/ACT nasal spray Place 2 sprays into both nostrils daily.   levothyroxine (SYNTHROID) 75 MCG tablet TAKE 1 TABLET BY MOUTH DAILY BEFORE BREAKFAST.   meclizine (ANTIVERT) 25 MG tablet Take 0.5 tablets (12.5 mg total) by mouth 3 (three) times daily as needed for dizziness or nausea.   methocarbamol (ROBAXIN) 500 MG tablet Take 1 tablet (500  mg total) by mouth at bedtime as needed for muscle spasms.   montelukast (SINGULAIR) 10 MG tablet TAKE 1 TABLET BY MOUTH EVERYDAY AT BEDTIME   omeprazole (PRILOSEC) 40 MG capsule TAKE 1 CAPSULE BY MOUTH TWICE A DAY   rosuvastatin (CRESTOR) 20 MG tablet TAKE 1 TABLET BY MOUTH EVERY DAY   spironolactone (ALDACTONE) 25 MG tablet Take 12.5 mg by mouth daily.   spironolactone (ALDACTONE) 25 MG tablet TAKE 1/2 TABLET BY MOUTH EVERY DAY   Turmeric 500 MG TABS Take 1,000 mg by mouth daily.   valsartan (DIOVAN) 320 MG tablet Take 1 tablet (320 mg total) by mouth daily.    Allergies:   Patient has no known allergies.   Social History   Socioeconomic History   Marital status: Widowed    Spouse name: Not on file   Number of children: 2   Years of education: Not on file   Highest education level: Some college, no degree  Occupational History    Comment: retired Science writer  Tobacco Use   Smoking status: Never   Smokeless tobacco: Never  Vaping Use    Vaping Use: Never used  Substance and Sexual Activity   Alcohol use: Not Currently    Comment: Occasional   Drug use: Never   Sexual activity: Never    Birth control/protection: Surgical  Other Topics Concern   Not on file  Social History Narrative   Moved here in 2019, had been living in [place].    Born here, raised daughters here.    Lives with [place] now, [place].   Lost one daughter in car wreck.    Social Determinants of Health   Financial Resource Strain: Low Risk  (11/29/2021)   Overall Financial Resource Strain (CARDIA)    Difficulty of Paying Living Expenses: Not hard at all  Food Insecurity: No Food Insecurity (11/29/2021)   Hunger Vital Sign    Worried About Running Out of Food in the Last Year: Never true    Ran Out of Food in the Last Year: Never true  Transportation Needs: No Transportation Needs (11/29/2021)   PRAPARE - Hydrologist (Medical): No    Lack of Transportation (Non-Medical): No  Physical Activity: Insufficiently Active (11/29/2021)   Exercise Vital Sign    Days of Exercise per Week: 2 days    Minutes of Exercise per Session: 20 min  Stress: No Stress Concern Present (11/29/2021)   [place]    Feeling of Stress : Not at all  Social Connections: Moderately Integrated (11/29/2021)   Social Connection and Isolation Panel [NHANES]    Frequency of Communication with Friends and Family: Three times a week    Frequency of Social Gatherings with Friends and Family: Twice a week    Attends Religious Services: More than 4 times per year    Active Member of [employer] or Organizations: Yes    Attends Archivist Meetings: More than 4 times per year    Marital Status: Widowed     Family History:  The patient's family history includes Heart Problems in her brother, father, and mother; Heart attack in her brother and mother. There is no history of Colon  cancer or Breast cancer.  ROS:   12-point review of systems is negative unless otherwise noted in HPI.   EKGs/Labs/Other Studies Reviewed:    Studies reviewed were summarized above. The additional studies were reviewed today:  2D echo 06/22/2022: 1.  Left ventricular ejection fraction, by estimation, is 60 to 65%. The  left ventricle has normal function. The left ventricle has no regional  wall motion abnormalities. There is moderate left ventricular hypertrophy.  Left ventricular diastolic  parameters are consistent with Grade I diastolic dysfunction (impaired  relaxation).   2. Right ventricular systolic function is normal. The right ventricular  size is normal.   3. Left atrial size was mildly dilated.   4. The mitral valve is normal in structure. Mild mitral valve  regurgitation. No evidence of mitral stenosis.   5. The aortic valve is normal in structure. Aortic valve regurgitation is  not visualized. Aortic valve sclerosis/calcification is present, without  any evidence of aortic stenosis.   6. The inferior vena cava is normal in size with greater than 50%  respiratory variability, suggesting right atrial pressure of 3 mmHg. __________   LHC 05/24/2022:   Mid RCA lesion is 10% stenosed.   Prox RCA to Mid RCA lesion is 95% stenosed.   Prox LAD to Mid LAD lesion is 20% stenosed.   A drug-eluting stent was successfully placed using a STENT ONYX FRONTIER 3.0X18.   Post intervention, there is a 0% residual stenosis.   1.  Moderately to severely calcified coronary arteries with significant one-vessel coronary artery disease.   There is 95% stenosis in the proximal/mid right coronary artery just before the previously placed stent.   2.  Left ventricular angiography was not performed due to chronic kidney disease.  LVEDP was at the upper limit of normal. 3.  Successful angioplasty and drug-eluting stent placement to the right coronary artery overlapping with the previously placed  stent. 4.  Difficult catheterization via the right radial artery due to significant tortuosity of the innominate and right subclavian arteries.  Consider alternative route in the future.   Recommendations: Dual antiplatelet therapy for at least 6 months. Aggressive treatment of risk factors. __________   Carotid artery ultrasound 02/23/2022: Summary:  Right Carotid: Velocities in the right ICA are consistent with a 1-39%  stenosis. Non-hemodynamically significant plaque <50% noted in the  CCA. The ECA appears <50% stenosed.   Left Carotid: Velocities in the left ICA are consistent with a 60-79%  stenosis. Non-hemodynamically significant plaque <50% noted in the  CCA. The ECA appears >50% stenosed.      The bilateral lobes of the thyroid had a globuated  appearance   Suggest follow up study in 12 months.  __________   Renal artery ultrasound 07/16/2020: Summary:  Largest Aortic Diameter: 2.0 cm     Renal:     Right: Normal size right kidney. Normal right Resisitive Index.         Normal cortical thickness of right kidney. No evidence of         right renal artery stenosis. RRV flow present.  Left:  LRV flow present. No evidence of left renal artery stenosis.         Normal size of left kidney. Normal left Resistive Index.         Normal cortical thickness of the left kidney. __________   Outpatient cardiac monitoring 01/2020: Normal sinus rhythm with an average heart rate of 70 bpm. 1 short run of SVT lasted 4 beats. Frequent PVCs with a burden of 10.5%.  Ventricular bigeminy and trigeminy were present.   EKG:  EKG is not ordered today.    Recent Labs: 01/23/2022: ALT 10 07/17/2022: BUN 22; Creatinine, Ser 1.33; Hemoglobin 11.7; Platelets 314; Potassium 4.3; Sodium  140; TSH 2.220  Recent Lipid Panel    Component Value Date/Time   CHOL 160 07/17/2022 0903   TRIG 122 07/17/2022 0903   HDL 69 07/17/2022 0903   CHOLHDL 2.3 07/17/2022 0903   CHOLHDL 3 12/19/2019 0957    VLDL 36.4 12/19/2019 0957   LDLCALC 70 07/17/2022 0903    PHYSICAL EXAM:    VS:  BP 136/72 (BP Location: Left Arm)   Pulse 76   Ht 5' (1.524 m)   Wt 141 lb 3.2 oz (64 kg)   SpO2 96%   BMI 27.58 kg/m   BMI: Body mass index is 27.58 kg/m.  Physical Exam Vitals reviewed.  Constitutional:      Appearance: She is well-developed.  HENT:     Head: Normocephalic and atraumatic.  Eyes:     General:        Right eye: No discharge.        Left eye: No discharge.  Neck:     Vascular: No JVD.  Cardiovascular:     Rate and Rhythm: Normal rate and regular rhythm.     Pulses:          Carotid pulses are  on the left side with bruit.      Posterior tibial pulses are 2+ on the right side and 2+ on the left side.     Heart sounds: Normal heart sounds, S1 normal and S2 normal. Heart sounds not distant. No midsystolic click and no opening snap. No murmur heard.    No friction rub.  Pulmonary:     Effort: Pulmonary effort is normal. No respiratory distress.     Breath sounds: Normal breath sounds. No decreased breath sounds, wheezing or rales.  Chest:     Chest wall: No tenderness.  Abdominal:     General: There is no distension.  Musculoskeletal:     Cervical back: Normal range of motion.  Skin:    General: Skin is warm and dry.     Nails: There is no clubbing.  Neurological:     Mental Status: She is alert and oriented to person, place, and time.  Psychiatric:        Speech: Speech normal.        Behavior: Behavior normal.        Thought Content: Thought content normal.        Judgment: Judgment normal.     Wt Readings from Last 3 Encounters:  11/30/22 141 lb 3.2 oz (64 kg)  09/22/22 140 lb 11.2 oz (63.8 kg)  08/08/22 142 lb (64.4 kg)     ASSESSMENT & PLAN:   CAD involving the native coronary arteries without angina: She continues to do very well from a cardiac perspective and is without symptoms of angina or decompensation.  Continue secondary prevention and aggressive  risk factor modification including DAPT with aspirin and clopidogrel without interruption for minimum of 6 months dating back to date of PCI along with amlodipine, carvedilol, and rosuvastatin.  With history of CVA, will likely plan to continue DAPT long-term, as long as tolerating, given that she was on dual antiplatelet therapy prior to PCI.  No indication for further ischemic testing at this time.  HTN: Blood pressure is reasonably controlled in the office today and well-controlled at home.  She remains on amlodipine, spironolactone, and valsartan.  Recent BMP stable.  HLD: LDL 70 in 07/2022 with normal AST/ALT in 01/2022.  She remains on rosuvastatin.  PVCs: Quiescent.  No longer on beta-blocker  secondary to bradycardia and fatigue.  Sinus bradycardia: Improved following discontinuation of beta-blocker.  Carotid artery disease: Most recent carotid artery ultrasound from 02/2022 demonstrated progression of LICA stenosis of 60 to 79% (previously 50 to 69%).  She remains on medical therapy as outlined above.  Follow-up imaging in 02/2023.  History of CVA: No new deficits.  He remains on aspirin, clopidogrel, and rosuvastatin.  History of GI bleed/iron deficiency anemia: Hgb stable.  No symptoms concerning for recurrence.  CKD stage III: Stable on recent check.   Disposition: F/u with Dr. Fletcher Anon or an APP after carotid artery ultrasound in 02/2023.   Medication Adjustments/Labs and Tests Ordered: Current medicines are reviewed at length with the patient today.  Concerns regarding medicines are outlined above. Medication changes, Labs and Tests ordered today are summarized above and listed in the Patient Instructions accessible in Encounters.   Signed, Christell Faith, PA-C 11/30/2022 3:31 PM     [facility] [address] [place] [address] [place], [place] [address] [phone]

## 2022-11-29 ENCOUNTER — Telehealth: Payer: Self-pay | Admitting: Family Medicine

## 2022-11-29 NOTE — Telephone Encounter (Signed)
Left message for patient to call back and schedule Medicare Annual Wellness Visit (AWV) in office.   If not able to come in office, please offer to do virtually or by telephone.  Left office number and my jabber [phone].  Last AWV:11/29/2021   Please schedule at anytime with Nurse Health Advisor.

## 2022-11-30 ENCOUNTER — Ambulatory Visit: Payer: Medicare Other | Attending: Physician Assistant | Admitting: Physician Assistant

## 2022-11-30 ENCOUNTER — Encounter: Payer: Self-pay | Admitting: Physician Assistant

## 2022-11-30 VITALS — BP 136/72 | HR 76 | Ht 60.0 in | Wt 141.2 lb

## 2022-11-30 DIAGNOSIS — I10 Essential (primary) hypertension: Secondary | ICD-10-CM

## 2022-11-30 DIAGNOSIS — I493 Ventricular premature depolarization: Secondary | ICD-10-CM | POA: Diagnosis not present

## 2022-11-30 DIAGNOSIS — I200 Unstable angina: Secondary | ICD-10-CM | POA: Diagnosis not present

## 2022-11-30 DIAGNOSIS — R001 Bradycardia, unspecified: Secondary | ICD-10-CM

## 2022-11-30 DIAGNOSIS — Z8673 Personal history of transient ischemic attack (TIA), and cerebral infarction without residual deficits: Secondary | ICD-10-CM | POA: Diagnosis not present

## 2022-11-30 DIAGNOSIS — Z8719 Personal history of other diseases of the digestive system: Secondary | ICD-10-CM

## 2022-11-30 DIAGNOSIS — E785 Hyperlipidemia, unspecified: Secondary | ICD-10-CM

## 2022-11-30 DIAGNOSIS — I6522 Occlusion and stenosis of left carotid artery: Secondary | ICD-10-CM

## 2022-11-30 DIAGNOSIS — I2510 Atherosclerotic heart disease of native coronary artery without angina pectoris: Secondary | ICD-10-CM

## 2022-11-30 DIAGNOSIS — N1832 Chronic kidney disease, stage 3b: Secondary | ICD-10-CM | POA: Diagnosis not present

## 2022-11-30 NOTE — Patient Instructions (Addendum)
Medication Instructions:   Your physician recommends that you continue on your current medications as directed. Please refer to the Current Medication list given to you today.  *If you need a refill on your cardiac medications before your next appointment, please call your pharmacy*   Lab Work:  NONE  If you have labs (blood work) drawn today and your tests are completely normal, you will receive your results only by: [place] (if you have MyChart) OR A paper copy in the mail If you have any lab test that is abnormal or we need to change your treatment, we will call you to review the results.   Testing/Procedures:   Your physician has requested that you have a carotid duplex. This test is an ultrasound of the carotid arteries in your neck. It looks at blood flow through these arteries that supply the brain with blood. Allow one hour for this exam. There are no restrictions or special instructions.  March 2024  Follow-Up: At [facility], you and your health needs are our priority.  As part of our continuing mission to provide you with exceptional heart care, we have created designated Provider Care Teams.  These Care Teams include your primary Cardiologist (physician) and Advanced Practice Providers (APPs -  Physician Assistants and Nurse Practitioners) who all work together to provide you with the care you need, when you need it.  We recommend signing up for the patient portal called "MyChart".  Sign up information is provided on this After Visit Summary.  MyChart is used to connect with patients for Virtual Visits (Telemedicine).  Patients are able to view lab/test results, encounter notes, upcoming appointments, etc.  Non-urgent messages can be sent to your provider as well.   To learn more about what you can do with MyChart, go to [email or web address].    Your next appointment:    Follow up after carotid ultrasound   The format for your next appointment:   In  Person  Provider:   You may see Kathlyn Sacramento, MD or one of the following Advanced Practice Providers on your designated Care Team:   Murray Hodgkins, NP Christell Faith, PA-C Cadence Kathlen Mody, PA-C Gerrie Nordmann, NP  Important Information About [place]

## 2022-12-07 ENCOUNTER — Telehealth: Payer: Self-pay | Admitting: Family Medicine

## 2022-12-07 NOTE — Telephone Encounter (Signed)
Left message for patient to call back and schedule Medicare Annual Wellness Visit (AWV) in office.   If not able to come in office, please offer to do virtually or by telephone.  Left office number and my jabber [phone].  Last AWV:11/29/2021   Please schedule at anytime with Nurse Health Advisor.

## 2022-12-14 DIAGNOSIS — R809 Proteinuria, unspecified: Secondary | ICD-10-CM | POA: Diagnosis not present

## 2022-12-14 DIAGNOSIS — I129 Hypertensive chronic kidney disease with stage 1 through stage 4 chronic kidney disease, or unspecified chronic kidney disease: Secondary | ICD-10-CM | POA: Diagnosis not present

## 2022-12-14 DIAGNOSIS — N1832 Chronic kidney disease, stage 3b: Secondary | ICD-10-CM | POA: Diagnosis not present

## 2022-12-15 ENCOUNTER — Ambulatory Visit (INDEPENDENT_AMBULATORY_CARE_PROVIDER_SITE_OTHER): Payer: Medicare Other | Admitting: Family Medicine

## 2022-12-15 ENCOUNTER — Encounter: Payer: Self-pay | Admitting: Family Medicine

## 2022-12-15 ENCOUNTER — Telehealth: Payer: Self-pay

## 2022-12-15 VITALS — BP 129/57 | HR 96 | Temp 97.8°F | Resp 16 | Wt 141.0 lb

## 2022-12-15 DIAGNOSIS — E785 Hyperlipidemia, unspecified: Secondary | ICD-10-CM | POA: Diagnosis not present

## 2022-12-15 DIAGNOSIS — I10 Essential (primary) hypertension: Secondary | ICD-10-CM | POA: Diagnosis not present

## 2022-12-15 DIAGNOSIS — E039 Hypothyroidism, unspecified: Secondary | ICD-10-CM | POA: Diagnosis not present

## 2022-12-15 DIAGNOSIS — J4520 Mild intermittent asthma, uncomplicated: Secondary | ICD-10-CM

## 2022-12-15 DIAGNOSIS — R7303 Prediabetes: Secondary | ICD-10-CM | POA: Diagnosis not present

## 2022-12-15 DIAGNOSIS — M19012 Primary osteoarthritis, left shoulder: Secondary | ICD-10-CM | POA: Diagnosis not present

## 2022-12-15 DIAGNOSIS — K219 Gastro-esophageal reflux disease without esophagitis: Secondary | ICD-10-CM

## 2022-12-15 DIAGNOSIS — M62838 Other muscle spasm: Secondary | ICD-10-CM | POA: Diagnosis not present

## 2022-12-15 MED ORDER — OMEPRAZOLE 40 MG PO CPDR: 40.0000 mg | DELAYED_RELEASE_CAPSULE | Freq: Two times a day (BID) | ORAL | 1 refills | Status: DC

## 2022-12-15 MED ORDER — METHOCARBAMOL 500 MG PO TABS: 500.0000 mg | ORAL_TABLET | Freq: Every evening | ORAL | 1 refills | Status: DC | PRN

## 2022-12-15 MED ORDER — MONTELUKAST SODIUM 10 MG PO TABS: 10.0000 mg | ORAL_TABLET | Freq: Every day | ORAL | 1 refills | Status: DC

## 2022-12-15 MED ORDER — FLUTICASONE PROPIONATE 50 MCG/ACT NA SUSP: 2.0000 | Freq: Every day | NASAL | 6 refills | Status: DC

## 2022-12-15 MED ORDER — ROSUVASTATIN CALCIUM 20 MG PO TABS: 20.0000 mg | ORAL_TABLET | Freq: Every day | ORAL | 1 refills | Status: DC

## 2022-12-15 MED ORDER — BUDESONIDE-FORMOTEROL FUMARATE 160-4.5 MCG/ACT IN AERO: 2.0000 | INHALATION_SPRAY | Freq: Two times a day (BID) | RESPIRATORY_TRACT | 3 refills | Status: DC

## 2022-12-15 MED ORDER — AMLODIPINE BESYLATE 10 MG PO TABS: 10.0000 mg | ORAL_TABLET | Freq: Every day | ORAL | 1 refills | Status: DC

## 2022-12-15 MED ORDER — LEVOTHYROXINE SODIUM 75 MCG PO TABS: 75.0000 ug | ORAL_TABLET | Freq: Every day | ORAL | 1 refills | Status: DC

## 2022-12-15 NOTE — Assessment & Plan Note (Signed)
Recommend low carb diet °Recheck A1c  °

## 2022-12-15 NOTE — Progress Notes (Signed)
I,Sulibeya S Dimas,acting as a scribe for Lavon Paganini, MD.,have documented all relevant documentation on the behalf of Lavon Paganini, MD,as directed by  Lavon Paganini, MD while in the presence of Lavon Paganini, MD.     Established patient visit   Patient: Andrea Hart   DOB: [birth year]   86 y.o. Female  MRN: [id number] Visit Date: 12/15/2022  Today's healthcare provider: Lavon Paganini, MD   Chief Complaint  Patient presents with   Hypertension   Subjective    HPI  Hypertension, follow-up  BP Readings from Last 3 Encounters:  12/15/22 (!) 129/57  11/30/22 136/72  08/08/22 (!) 140/70   Wt Readings from Last 3 Encounters:  12/15/22 141 lb (64 kg)  11/30/22 141 lb 3.2 oz (64 kg)  09/22/22 140 lb 11.2 oz (63.8 kg)     She was last seen for hypertension 4 months ago.  BP at that visit was 130/76. Management since that visit includes no changes.  She reports excellent compliance with treatment. She is not having side effects.  Pertinent labs Lab Results  Component Value Date   CHOL 160 07/17/2022   HDL 69 07/17/2022   LDLCALC 70 07/17/2022   TRIG 122 07/17/2022   CHOLHDL 2.3 07/17/2022   Lab Results  Component Value Date   NA 140 07/17/2022   K 4.3 07/17/2022   CREATININE 1.33 (H) 07/17/2022   EGFR 40 (L) 07/17/2022   GLUCOSE 119 (H) 07/17/2022   TSH 2.220 07/17/2022     The ASCVD Risk score (Arnett DK, et al., 2019) failed to calculate for the following reasons:   The 2019 ASCVD risk score is only valid for ages 87 to 85  --------------------------------------------------------------------------------------------------- Hypothyroid, follow-up  Lab Results  Component Value Date   TSH 2.220 07/17/2022   TSH 2.210 01/23/2022   TSH 2.330 10/03/2021   T4TOTAL 10.5 10/28/2018    Wt Readings from Last 3 Encounters:  12/15/22 141 lb (64 kg)  11/30/22 141 lb 3.2 oz (64 kg)  09/22/22 140 lb 11.2 oz (63.8 kg)    She was last seen for  hypothyroid 4 months ago.  Management since that visit includes no changes. She reports excellent compliance with treatment. She is not having side effects.  -----------------------------------------------------------------------------------------   Medications: Outpatient Medications Prior to Visit  Medication Sig   albuterol (VENTOLIN HFA) 108 (90 Base) MCG/ACT inhaler Inhale 2 puffs into the lungs every 4 (four) hours as needed for wheezing or shortness of breath.   aspirin EC 81 MG tablet Take 81 mg by mouth daily.   cetirizine (ZYRTEC) 10 MG tablet Take 1 tablet (10 mg total) by mouth daily.   clopidogrel (PLAVIX) 75 MG tablet TAKE 1 TABLET BY MOUTH EVERY DAY   meclizine (ANTIVERT) 25 MG tablet Take 0.5 tablets (12.5 mg total) by mouth 3 (three) times daily as needed for dizziness or nausea.   spironolactone (ALDACTONE) 25 MG tablet TAKE 1/2 TABLET BY MOUTH EVERY DAY   Turmeric 500 MG TABS Take 1,000 mg by mouth daily.   valsartan (DIOVAN) 320 MG tablet Take 1 tablet (320 mg total) by mouth daily.   [DISCONTINUED] amLODipine (NORVASC) 10 MG tablet TAKE 1 TABLET BY MOUTH EVERY DAY   [DISCONTINUED] budesonide-formoterol (SYMBICORT) 160-4.5 MCG/ACT inhaler Inhale 2 puffs into the lungs 2 (two) times daily.   [DISCONTINUED] fluticasone (FLONASE) 50 MCG/ACT nasal spray Place 2 sprays into both nostrils daily.   [DISCONTINUED] levothyroxine (SYNTHROID) 75 MCG tablet TAKE 1 TABLET BY MOUTH DAILY BEFORE  BREAKFAST.   [DISCONTINUED] methocarbamol (ROBAXIN) 500 MG tablet Take 1 tablet (500 mg total) by mouth at bedtime as needed for muscle spasms.   [DISCONTINUED] montelukast (SINGULAIR) 10 MG tablet TAKE 1 TABLET BY MOUTH EVERYDAY AT BEDTIME   [DISCONTINUED] omeprazole (PRILOSEC) 40 MG capsule TAKE 1 CAPSULE BY MOUTH TWICE A DAY   [DISCONTINUED] rosuvastatin (CRESTOR) 20 MG tablet TAKE 1 TABLET BY MOUTH EVERY DAY   [DISCONTINUED] spironolactone (ALDACTONE) 25 MG tablet Take 12.5 mg by mouth  daily. (Patient not taking: Reported on 12/15/2022)   No facility-administered medications prior to visit.    Review of Systems  Constitutional:  Negative for appetite change, chills, fatigue and fever.  Eyes:  Negative for visual disturbance.  Respiratory:  Negative for chest tightness and shortness of breath.   Cardiovascular:  Negative for chest pain and leg swelling.  Gastrointestinal:  Negative for abdominal pain, nausea and vomiting.  Endocrine: Negative for cold intolerance and heat intolerance.  Neurological:  Negative for dizziness, light-headedness and headaches.       Objective    BP (!) 129/57 (BP Location: Left Arm, Patient Position: Sitting, Cuff Size: Normal)   Pulse 96   Temp 97.8 F (36.6 C) (Temporal)   Resp 16   Wt 141 lb (64 kg)   SpO2 99%   BMI 27.54 kg/m  BP Readings from Last 3 Encounters:  12/15/22 (!) 129/57  11/30/22 136/72  08/08/22 (!) 140/70   Wt Readings from Last 3 Encounters:  12/15/22 141 lb (64 kg)  11/30/22 141 lb 3.2 oz (64 kg)  09/22/22 140 lb 11.2 oz (63.8 kg)      Physical Exam Vitals reviewed.  Constitutional:      General: She is not in acute distress.    Appearance: Normal appearance. She is well-developed. She is not diaphoretic.  HENT:     Head: Normocephalic and atraumatic.  Eyes:     General: No scleral icterus.    Conjunctiva/sclera: Conjunctivae normal.  Neck:     Thyroid: No thyromegaly.  Cardiovascular:     Rate and Rhythm: Normal rate and regular rhythm.     Heart sounds: Normal heart sounds. No murmur heard. Pulmonary:     Effort: Pulmonary effort is normal. No respiratory distress.     Breath sounds: Normal breath sounds. No wheezing, rhonchi or rales.  Musculoskeletal:     Cervical back: Neck supple.     Right lower leg: No edema.     Left lower leg: No edema.  Lymphadenopathy:     Cervical: No cervical adenopathy.  Skin:    General: Skin is warm and dry.     Findings: No rash.  Neurological:      Mental Status: She is alert and oriented to person, place, and time. Mental status is at baseline.  Psychiatric:        Mood and Affect: Mood normal.        Behavior: Behavior normal.     No results found for any visits on 12/15/22.  Assessment & Plan     Problem List Items Addressed This Visit       Cardiovascular and Mediastinum   Essential hypertension - Primary    Well controlled Continue current medications Recheck metabolic panel F/u in 6 months       Relevant Medications   amLODipine (NORVASC) 10 MG tablet   rosuvastatin (CRESTOR) 20 MG tablet     Respiratory   Mild intermittent asthma    Chronic, well controlled, no  changes      Relevant Medications   budesonide-formoterol (SYMBICORT) 160-4.5 MCG/ACT inhaler   montelukast (SINGULAIR) 10 MG tablet     Digestive   Gastroesophageal reflux disease without esophagitis    Chronic and stable Continue PPI      Relevant Medications   omeprazole (PRILOSEC) 40 MG capsule     Endocrine   Hypothyroidism    Previously well controlled Continue Synthroid at current dose  Recheck TSH and adjust Synthroid as indicated        Relevant Medications   levothyroxine (SYNTHROID) 75 MCG tablet   Other Relevant Orders   TSH     Musculoskeletal and Integument   Osteoarthritis of glenohumeral joint, left   Relevant Medications   methocarbamol (ROBAXIN) 500 MG tablet     Other   Hyperlipidemia    Previously well controlled Continue statin Repeat FLP and CMP Goal LDL < 70      Relevant Medications   amLODipine (NORVASC) 10 MG tablet   rosuvastatin (CRESTOR) 20 MG tablet   Prediabetes    Recommend low carb diet Recheck A1c        Relevant Orders   Hemoglobin A1c   Cervical paraspinal muscle spasm   Relevant Medications   methocarbamol (ROBAXIN) 500 MG tablet     Return in about 6 months (around 06/15/2023) for AWV.      I, Lavon Paganini, MD, have reviewed all documentation for this visit. The  documentation on 12/15/22 for the exam, diagnosis, procedures, and orders are all accurate and complete.   Malichi Palardy, Dionne Bucy, MD, MPH [facility]

## 2022-12-15 NOTE — Telephone Encounter (Signed)
Called pt and got updated medicare number.

## 2022-12-15 NOTE — Assessment & Plan Note (Signed)
Chronic and stable Continue PPI 

## 2022-12-15 NOTE — Assessment & Plan Note (Signed)
Chronic, well controlled, no changes

## 2022-12-15 NOTE — Assessment & Plan Note (Signed)
Well controlled Continue current medications Recheck metabolic panel F/u in 6 months  

## 2022-12-15 NOTE — Telephone Encounter (Signed)
Copied from [place]. Topic: General - Other >> Dec 15, 2022 12:49 PM Cyndi Bender wrote: Reason for CRM: Pt stated she was asked to call the office to provide her insurance information. Pt requests call back at [phone]

## 2022-12-15 NOTE — Assessment & Plan Note (Signed)
Previously well controlled Continue Synthroid at current dose  Recheck TSH and adjust Synthroid as indicated   

## 2022-12-15 NOTE — Assessment & Plan Note (Signed)
Previously well controlled Continue statin Repeat FLP and CMP Goal LDL < 70

## 2022-12-16 LAB — HEMOGLOBIN A1C
Est. average glucose Bld gHb Est-mCnc: 126 mg/dL
Hgb A1c MFr Bld: 6 % — ABNORMAL HIGH (ref 4.8–5.6)

## 2022-12-16 LAB — TSH: TSH: 3.3 u[IU]/mL (ref 0.450–4.500)

## 2023-01-01 ENCOUNTER — Other Ambulatory Visit: Payer: Self-pay | Admitting: Physician Assistant

## 2023-01-02 ENCOUNTER — Ambulatory Visit: Payer: Medicare Other | Attending: Family Medicine | Admitting: Physical Therapy

## 2023-01-02 ENCOUNTER — Other Ambulatory Visit: Payer: Self-pay

## 2023-01-02 ENCOUNTER — Encounter: Payer: Self-pay | Admitting: Physical Therapy

## 2023-01-02 DIAGNOSIS — M19012 Primary osteoarthritis, left shoulder: Secondary | ICD-10-CM | POA: Diagnosis not present

## 2023-01-02 DIAGNOSIS — M62838 Other muscle spasm: Secondary | ICD-10-CM | POA: Diagnosis not present

## 2023-01-02 DIAGNOSIS — G8929 Other chronic pain: Secondary | ICD-10-CM | POA: Insufficient documentation

## 2023-01-02 DIAGNOSIS — M25512 Pain in left shoulder: Secondary | ICD-10-CM | POA: Insufficient documentation

## 2023-01-02 NOTE — Therapy (Signed)
OUTPATIENT PHYSICAL THERAPY SHOULDER EVALUATION   Patient Name: Andrea Hart MRN: [id number] DOB:09-02-1936, 87 y.o., female Today's Date: 01/02/2023  END OF SESSION:  PT End of Session - 01/02/23 1712     Visit Number 1    Number of Visits 1    Date for PT Re-Evaluation 01/02/23    Authorization - Visit Number 1    Authorization - Number of Visits 1    PT Start Time 3976    PT Stop Time 1630    PT Time Calculation (min) 45 min    Activity Tolerance Patient limited by pain    Behavior During Therapy Agitated             Past Medical History:  Diagnosis Date   Abdominal hernia    Arthritis    Asthma    Carotid arterial disease ([place])    a. 11/2020 Carotid U/S: Less than 73% RICA, 41-93% LICA.   CKD (chronic kidney disease), stage III ([place])    Coronary artery disease    a. 2013 s/p PCI and stent placement in [facility] done for stable angina.   GI bleeding 02/02/2021   Hyperlipidemia    Hypertension    Hypothyroidism    PVC's (premature ventricular contractions)    a. 01/2020 [place]: RSR, 70, frequent PVCs with 10.5% burden.   Stroke [facility])    a. 2017 - ILR did not show afib.   Past Surgical History:  Procedure Laterality Date   ABDOMINAL HYSTERECTOMY     APPENDECTOMY     CATARACT EXTRACTION Bilateral    CHOLECYSTECTOMY     COLONOSCOPY WITH PROPOFOL N/A 04/06/2021   Procedure: COLONOSCOPY WITH PROPOFOL;  Surgeon: Toledo, Benay Pike, MD;  Location: [facility] ENDOSCOPY;  Service: Gastroenterology;  Laterality: N/A;   CORONARY ANGIOPLASTY  2013   1xStent [facility] [place].   CORONARY STENT INTERVENTION N/A 05/24/2022   Procedure: CORONARY STENT INTERVENTION;  Surgeon: Wellington Hampshire, MD;  Location: [place] CV LAB;  Service: Cardiovascular;  Laterality: N/A;   EYE SURGERY     HAND SURGERY Right    LEFT HEART CATH AND CORONARY ANGIOGRAPHY Left 05/24/2022   Procedure: LEFT HEART CATH AND CORONARY ANGIOGRAPHY;  Surgeon: Wellington Hampshire, MD;  Location:  [place] CV LAB;  Service: Cardiovascular;  Laterality: Left;   LOOP RECORDER IMPLANT     NISSEN FUNDOPLICATION  7902   REPLACEMENT TOTAL KNEE Bilateral 2017   TOTAL HIP ARTHROPLASTY Right    TOTAL VAGINAL HYSTERECTOMY     Patient Active Problem List   Diagnosis Date Noted   Stress reaction 07/17/2022   Cervical paraspinal muscle spasm 06/19/2022   Effort angina 05/24/2022   DOE (dyspnea on exertion) 04/28/2022   Osteoarthritis of glenohumeral joint, left 02/21/2022   Knee pain 11/29/2021   Osteoarthritis of knee 11/29/2021   Prediabetes 07/21/2021   Unsteady gait when walking 08/13/2020   Vitamin D deficiency 08/13/2020   CAD (coronary artery disease) 12/29/2019   CKD (chronic kidney disease), stage III ([place]) 12/29/2019   H/O coronary angioplasty 07/29/2018   Osteoarthritis 07/29/2018   Gastroesophageal reflux disease without esophagitis 07/29/2018   Hypothyroidism 05/20/2018   Anemia 05/20/2018   Essential hypertension 05/20/2018   Mild intermittent asthma 05/20/2018   Hyperlipidemia 05/20/2018   Dry eye syndrome, bilateral 08/09/2017   Pseudophakia, both eyes 06/29/2017   PVD (posterior vitreous detachment), right 06/29/2017   Macular retinal edema 06/26/2017    PCP: Dr. Brita Romp  REFERRING PROVIDER: Dr. Rosette Reveal  REFERRING DIAG: Left Shoulder Pain   THERAPY DIAG:  Chronic left shoulder pain  Osteoarthritis of left shoulder, unspecified osteoarthritis type  Rationale for Evaluation and Treatment: Rehabilitation  ONSET DATE: 01/02/2018  SUBJECTIVE:                                                                                                                                                                                      SUBJECTIVE STATEMENT:  PERTINENT HISTORY: She started experiencing left shoulder pain five years ago and she does not use shoulder much. Pt reports that she was referred her by Dr. Zigmund Kelly Ranieri, who has been doing cortisone  shots, which he has been doing for the past 4 or 5 months. Dr. Zigmund Raksha Wolfgang wants patient to trial PT because she has received several shots at this point. She was unable to therapy until now because she lacked transportation.  PAIN:  Are you having pain? Yes: NPRS scale: 8-9/10 Pain location: Anterior and posterior portion of left shoulder  Pain description: Sharp  Aggravating factors: When patient moves shoulder  Relieving factors: When patient holds arm   PRECAUTIONS: None  WEIGHT BEARING RESTRICTIONS: No  FALLS:  Has patient fallen in last 6 months? No  LIVING ENVIRONMENT: Lives with: lives with their daughter Lives in: House/apartment Stairs: Yes: External: 0 steps; none, 15 steps to daughters home with railing  Has following equipment at home: Single point cane  OCCUPATION: Retired   PLOF: Independent  PATIENT GOALS: Reduce right shoulder pain   NEXT MD VISIT: No visits scheduled   OBJECTIVE:   VITALS: BP 160/80 SpO2 100 HR 79   DIAGNOSTIC FINDINGS:  None listed for the shoulder   PATIENT SURVEYS:  FOTO Not taken given that this is one visit  COGNITION: Overall cognitive status: Within functional limits for tasks assessed     SENSATION: WFL  POSTURE: Forward flexed and rounded shoulders   UPPER EXTREMITY ROM:                                                                                                                      Cervical  AROM                                  Flex    45                                 Ext      45                        Lat [place] R/L 45/45                       Rotation       R/L     60/60  Active ROM Right eval Left eval  Shoulder flexion 110 45*  Shoulder extension 60 60  Shoulder abduction 90* 30*   Shoulder adduction    Shoulder internal rotation    Shoulder external rotation    Elbow flexion    Elbow extension    Wrist flexion    Wrist extension    Wrist ulnar deviation     Wrist radial deviation    Wrist pronation    Wrist supination    (Blank rows = not tested)               UPPER EXTREMITY MMT:  MMT Right eval Left eval  Shoulder flexion 4  3-  Shoulder extension 4+ 4+  Shoulder abduction 4+ 3-  Shoulder adduction    Shoulder internal rotation    Shoulder external rotation    Middle trapezius    Lower trapezius    Elbow flexion    Elbow extension    Wrist flexion    Wrist extension    Wrist ulnar deviation    Wrist radial deviation    Wrist pronation    Wrist supination    Grip strength (lbs)    (Blank rows = not tested)  SHOULDER SPECIAL TESTS: Rotator cuff assessment: Infraspinatus test: negative   JOINT MOBILITY TESTING:  Crepitus with   PALPATION:  Right posterior shoulder joint   TODAY'S TREATMENT:                                                                                                                                         DATE:   01/02/23: Shoulder Flexion AAROM with pulleys 1 x 10  -LUE only able to reach 45 degrees  Shoulder Abduction AAROM with pulleys 1 x 5  -Unable to perform on LUE because of severe pain  Seated Scapular Rows with red band 1 x 10    PATIENT EDUCATION: Education details: form and technique for appropriate exercise and explanation of underlying deficits  Person educated: Patient and Child(ren) Education method: Explanation, Demonstration, Tactile cues, Verbal cues, and Handouts Education  comprehension: verbalized understanding, returned demonstration, and verbal cues required  HOME EXERCISE PROGRAM: Access Code: [email or web address] URL: [email or web address] Date: 01/02/2023 Prepared by: Bradly Chris  Exercises - Seated Shoulder Row with Anchored Resistance  - 3 x weekly - 3 sets - 10 reps - Seated Shoulder Flexion AAROM with Pulley Behind  - 1 x daily - 1 sets - 10 reps  ASSESSMENT:  CLINICAL IMPRESSION: Patient is an 87 y.o. white woman who was seen today for  physical therapy evaluation and treatment for chronic left shoulder pain. All planes of motion with exception of extension are pain provoking with patient reporting NPS of 8/10 with movement. These movements are accompanied by crepitus and catching. These are all signs and symptoms of severe left shoulder OA. While there is no imaging to verify differential, pt has been instructed by past orthopedics that she will require a shoulder surgery and that nothing else will improve pain. Given severity of pain along with marked crepitus and catching, pt will likely not benefit from further PT to improve shoulder ROM and strength. PT advises pt receive further medical evaluation and treatment and not continue with PT. PT did advise that patient may benefit from aquatic exercise and that she would need to try and move shoulder or otherwise she would continue to lose mobility. Pt understands recommendations fully and she wants time to consider them and to pursue them independently.   OBJECTIVE IMPAIRMENTS: decreased ROM, decreased strength, hypomobility, impaired UE functional use, postural dysfunction, and pain.   ACTIVITY LIMITATIONS: carrying, lifting, sleeping, transfers, reach over head, and hygiene/grooming  PARTICIPATION LIMITATIONS: cleaning, laundry, shopping, yard work, and school  PERSONAL FACTORS: Age, Past/current experiences, Time since onset of injury/illness/exacerbation, and 1 comorbidity: Hypertension  are also affecting patient's functional outcome.   REHAB POTENTIAL: Poor severe OA that has been ongoing for years with increased pain with movement in nearly all planes   CLINICAL DECISION MAKING: Stable/uncomplicated  EVALUATION COMPLEXITY: Low   GOALS: Goals reviewed with patient? No  SHORT TERM GOALS: Target date: 01/16/2023  Pt will be independent with HEP in order to improve strength and balance in order to decrease fall risk and improve function at home and work. Baseline: Patient  demonstrates understanding of HEP  Goal status: MET   PLAN:  PT FREQUENCY:  N/a   PT DURATION: other: N/a   PLANNED INTERVENTIONS: N/a   PLAN FOR NEXT SESSION: No follow up   Bradly Chris PT, DPT  01/02/2023, 8:45 PM

## 2023-01-03 ENCOUNTER — Telehealth: Payer: Self-pay | Admitting: Physician Assistant

## 2023-01-03 NOTE — Telephone Encounter (Signed)
Spoke with patient and reviewed provider recommendations. Patient uses a brachial cuff to check her readings. Instructed her to bring that cuff to her next appointment. We also discussed diet changes to help with the ankles as well. She verbalized understanding of our conversation and had no further questions at this time.

## 2023-01-03 NOTE — Telephone Encounter (Signed)
Pt c/o BP issue: STAT if pt c/o blurred vision, one-sided weakness or slurred speech  1. What are your last 5 BP readings? 150/70's, 148/73 HR 75 a couple days ago, Sunday 186/80 HR 78, 10pm Sunday 199/82 HR 74, yesterday afternoon 155/74 HR 83, last night 158/77 HR 76, 2am 159/77 HR 75, 6am 170/80 HR 66  2. Are you having any other symptoms (ex. Dizziness, headache, blurred vision, passed out)? no  3. What is your BP issue? Patient states her BP has been high. She says she is not sure if it is because she is less active since it has been cold. She says she has also noticed when she wears tight socks she has an indention in her leg that goes away an hour or 2 later. She says she noticed it Sunday. She says she does not know if she takes a fluid pill and has not gained weight.

## 2023-01-03 NOTE — Telephone Encounter (Signed)
Blood pressure readings at multiple medical offices have been well controlled over the past several months. Agree with RN recommendations to check BP 2 hours after medications. Try to take medications at similar times daily as well. Ensure she is using a brachial BP cuff, not a wrist cuff. Would also recommend limiting salt intake. If she is using a brachial BP cuff, it may be beneficial for her to bring this cuff to her next appointment for comparison to the office BP reading to ensure accuracy.

## 2023-01-03 NOTE — Telephone Encounter (Signed)
Spoke with patient and she wanted to discuss her blood pressures. She reports them being elevated and is concerned. Patient reports feeling great with no other symptoms. These readings are at random times and before medications. We discussed monitoring her blood pressures and to make sure to check 2 hours after her medications.   She then reported she wears bobby socks and has noticed a ring around her ankles and is concerned about that. We discussed diet and how that can play a role. Patient stated that she just wanted to mention. Advised that I would send this over to provider for his review and if he should have any further recommendations. She verbalized understanding.

## 2023-01-05 ENCOUNTER — Other Ambulatory Visit: Payer: Self-pay | Admitting: Nurse Practitioner

## 2023-01-08 ENCOUNTER — Ambulatory Visit: Payer: Medicare Other | Admitting: Physical Therapy

## 2023-01-10 ENCOUNTER — Ambulatory Visit: Payer: Medicare Other | Admitting: Physical Therapy

## 2023-01-10 ENCOUNTER — Other Ambulatory Visit: Payer: Self-pay

## 2023-01-10 DIAGNOSIS — E785 Hyperlipidemia, unspecified: Secondary | ICD-10-CM

## 2023-01-10 DIAGNOSIS — I10 Essential (primary) hypertension: Secondary | ICD-10-CM

## 2023-01-10 MED ORDER — CLOPIDOGREL BISULFATE 75 MG PO TABS: 75.0000 mg | ORAL_TABLET | Freq: Every day | ORAL | 3 refills | Status: DC

## 2023-01-15 ENCOUNTER — Encounter: Payer: Medicare Other | Admitting: Physical Therapy

## 2023-01-16 NOTE — Telephone Encounter (Signed)
Pt dropped off folder with bp readings. Placed in nurse box.

## 2023-01-17 ENCOUNTER — Encounter: Payer: Medicare Other | Admitting: Physical Therapy

## 2023-01-18 MED ORDER — CARVEDILOL 3.125 MG PO TABS: 3.1250 mg | ORAL_TABLET | Freq: Two times a day (BID) | ORAL | 3 refills | Status: DC

## 2023-01-18 NOTE — Telephone Encounter (Signed)
Reviewed patient's home blood pressure log.  Most BPs range from the [id number] to [id number] systolic with a rare reading of [id number] systolic and an occasional reading of [id number] systolic.  Blood pressures were confirmed to be taken after medications.  Heart rates ranged from the 60s to 90 bpm.  She is no longer on beta-blocker secondary to bradycardia and fatigue.  Now that she is participating with cardiac rehab, if she is agreeable please have her restart carvedilol 3.125 mg twice daily and let us know if her fatigue returns with this medication.

## 2023-01-18 NOTE — Telephone Encounter (Signed)
Called patient to see if these blood pressure readings are 2 hours after medication and she reports that they are after meds. Patient reports feeling great with no problems. Advised that I would have provider review the list of her blood pressure readings and would be in touch with any recommendations. She verbalized understanding.

## 2023-01-18 NOTE — Addendum Note (Signed)
Addended by: Valora Corporal on: 01/18/2023 02:47 PM   Modules accepted: Orders

## 2023-01-18 NOTE — Telephone Encounter (Signed)
Spoke with patient and her daughter and reviewed provider recommendations. Both were agreeable for her to try the carvedilol again. Sent this to her confirmed pharmacy. She was appreciative for the call back.

## 2023-01-22 ENCOUNTER — Encounter: Payer: Medicare Other | Admitting: Physical Therapy

## 2023-01-24 ENCOUNTER — Encounter: Payer: Medicare Other | Admitting: Physical Therapy

## 2023-01-29 ENCOUNTER — Encounter: Payer: Medicare Other | Admitting: Physical Therapy

## 2023-01-31 ENCOUNTER — Encounter: Payer: Medicare Other | Admitting: Physical Therapy

## 2023-02-02 ENCOUNTER — Telehealth: Payer: Self-pay | Admitting: Physician Assistant

## 2023-02-02 NOTE — Telephone Encounter (Signed)
  Pt c/o medication issue:  1. Name of Medication: carvedilol (COREG) 3.125 MG tablet   2. How are you currently taking this medication (dosage and times per day)?   Take 1 tablet (3.125 mg total) by mouth 2 (two) times daily.    3. Are you having a reaction (difficulty breathing--STAT)? No   4. What is your medication issue? Pt said, she is feeling ill when she started taking this medication a week ago. She said, she stopped taking it today. She is requesting to speak with RN Olin Hauser to discuss

## 2023-02-02 NOTE — Telephone Encounter (Signed)
Patient reports was feeling great at last appointment until we added her carvedilol. She states it has made her ill, frustrated, and just not nice. She stopped it yesterday and is now feeling so much better. She inquired about this medication because she reports this was given to her before and for some reason they changed it. She would like to know when it was changed previously and why. Reviewed that I would send this to provider for his review and any recommendations.

## 2023-02-02 NOTE — Telephone Encounter (Signed)
Carvedilol was discontinued in late 2023 secondary to fatigue and bradycardia.  It was restarted in late 12/2022 due to elevated BP readings.  Given she is noting intolerance with rechallenge, albeit different symptoms, okay to discontinue carvedilol.  Let us know if blood pressure trends on the high side.

## 2023-02-02 NOTE — Telephone Encounter (Signed)
Called patient to review providers findings and recommendations. Instructed her to monitor blood pressures 2 hours after medications and inquired if she checks My Chart. She does and advised that I will send her some blood pressure tips when checking it. She verbalized understanding with no further questions at this time.

## 2023-02-05 ENCOUNTER — Encounter: Payer: Medicare Other | Admitting: Physical Therapy

## 2023-02-06 ENCOUNTER — Telehealth: Payer: Self-pay | Admitting: Cardiovascular Disease

## 2023-02-06 NOTE — Telephone Encounter (Signed)
Left a message for the patient to call back.  

## 2023-02-06 NOTE — Telephone Encounter (Signed)
Returned the call to the patient. She stated that since stopping the Carvedilol, her blood pressures have stayed elevated. She would like to try and take this again and see how it does.   Blood pressures: 174/80 - Last night 168/90 - This morning. 186/84- Noon today  Currently takes: Amlodipine 10 mg in the morning  Valsartan 320 mg once daily takes 6 pm at night Spironolactone 12.5 mg once daily in the morning.

## 2023-02-06 NOTE — Telephone Encounter (Signed)
Pt c/o BP issue: STAT if pt c/o blurred vision, one-sided weakness or slurred speech  1. What are your last 5 BP readings?  186/84- Noon today 168/90 - This morning. 174/80 - Last night    2. Are you having any other symptoms (ex. Dizziness, headache, blurred vision, passed out)? Nose bleed  3. What is your BP issue? Pt states that BP has been elevated for the last week or so and is causing her to feel terrible. Pt would like a callback regarding this matter

## 2023-02-06 NOTE — Telephone Encounter (Signed)
I've reviewed prior records.  Only significant bradycardia occurred on carvedilol 12.'5mg'$  bid.  I think it's reasonable to try carvedilol 3.'125mg'$  BID.  She should cont to follow HR/BP @ home.

## 2023-02-06 NOTE — Telephone Encounter (Signed)
Patient has been made aware. She has plenty at home. She will keep a log of her pressures and heart rates and let us know how this goes.

## 2023-02-07 ENCOUNTER — Encounter: Payer: Medicare Other | Admitting: Physical Therapy

## 2023-02-08 DIAGNOSIS — H43813 Vitreous degeneration, bilateral: Secondary | ICD-10-CM | POA: Diagnosis not present

## 2023-02-08 DIAGNOSIS — Z961 Presence of intraocular lens: Secondary | ICD-10-CM | POA: Diagnosis not present

## 2023-02-08 DIAGNOSIS — H26493 Other secondary cataract, bilateral: Secondary | ICD-10-CM | POA: Diagnosis not present

## 2023-02-12 ENCOUNTER — Encounter: Payer: Medicare Other | Admitting: Physical Therapy

## 2023-02-14 ENCOUNTER — Encounter: Payer: Medicare Other | Admitting: Physical Therapy

## 2023-03-01 ENCOUNTER — Ambulatory Visit: Payer: Medicare Other | Admitting: Physician Assistant

## 2023-03-15 ENCOUNTER — Ambulatory Visit: Payer: Medicare Other | Admitting: Physician Assistant

## 2023-04-03 ENCOUNTER — Other Ambulatory Visit: Payer: Self-pay | Admitting: Physician Assistant

## 2023-04-09 DIAGNOSIS — L400 Psoriasis vulgaris: Secondary | ICD-10-CM | POA: Diagnosis not present

## 2023-04-10 ENCOUNTER — Telehealth: Payer: Self-pay | Admitting: Physician Assistant

## 2023-04-10 ENCOUNTER — Ambulatory Visit: Payer: Medicare Other | Attending: Physician Assistant

## 2023-04-10 DIAGNOSIS — I6522 Occlusion and stenosis of left carotid artery: Secondary | ICD-10-CM | POA: Insufficient documentation

## 2023-04-10 NOTE — Telephone Encounter (Signed)
Patient dropped off BP readings placed in box. 

## 2023-04-10 NOTE — Telephone Encounter (Signed)
Reviewed BP readings which overall are on the higher side with most readings typically ranging from the 130s to 150s with occasional readings in the 170s and rare readings in the 180s to 190s over 70s to 90s with heart rates largely in the 60s to 70s bpm with occasional reading in the upper 50s bpm.  Currently taking carvedilol 3.125 mg twice daily, amlodipine 10 mg daily, spironolactone 12.5 mg daily, and valsartan 320 mg nightly.  She has been intolerant to higher doses of carvedilol secondary to fatigue and bradycardia precluding titration of medication.  Recommendations: -Increase spironolactone to 25 mg daily with continuation of carvedilol 3.125 mg twice daily, amlodipine 10 mg daily, and valsartan 320 mg daily -Keep follow-up appointment scheduled for later this week

## 2023-04-10 NOTE — Telephone Encounter (Signed)
Blood pressure log brought by office. Ranges for SBP 135-194. List of all blood pressures given to provider for his review and any recommendations.

## 2023-04-11 NOTE — Progress Notes (Signed)
Cardiology Office Note    Date:  04/12/2023   ID:  Andrea Hart, DOB 09-04-1936, MRN [id number]  PCP:  Erasmo Downer, MD  Cardiologist:  Lorine Bears, MD  Electrophysiologist:  None   Chief Complaint: Follow-up  History of Present Illness:   Andrea Hart is a 87 y.o. female with history of CAD status post stenting in 2013 to the RCA status post PCI/DES to the RCA overlapping the previously placed stent on 05/24/2022, CKD stage III, prior CVA x2, HTN, HLD, carotid artery disease, GI bleeding in 02/2021, PVCs, hypothyroidism, and asthma who presents for follow-up of CAD.   She underwent stenting of an unknown vessel in [place], [place] in 2013.  In 2017, she suffered a stroke.  ILR did not show any evidence of A-fib, and she has been maintained on aspirin and clopidogrel since.  In 01/2016, she complained of presyncope and underwent event monitoring.  This showed frequent PVCs with a 10% burden.  She reported a long history of PVCs and has been managed with beta-blocker therapy.  She has history of difficult to control hypertension with renal artery ultrasound in 07/2020 showing no evidence of RAS.  Carotid artery ultrasound in 11/2020 showed moderate, nonobstructive bilateral left greater than right ICA disease.  She was admitted in 02/2021 with rectal bleeding with GI feeling the presentation was most consistent with an acute diverticular bleed.  Aspirin and clopidogrel were held and she underwent outpatient colonoscopy in 04/2021 which demonstrated nonbleeding internal hemorrhoids, moderate diverticulosis without evidence of diverticular bleeding.  She was subsequently resumed on aspirin and clopidogrel.  She was seen in the office in 02/2022 and was without symptoms of angina or decompensation.  She remained active with multiple social events.  Carotid artery ultrasound from 02/2022 demonstrated 1 to 39% RICA stenosis and 60 to 79% LICA stenosis (previously 50 to 69%).  She was seen on 05/16/2022  noting a 9 to 75-month history of exertional dyspnea that had become more pronounced.  Symptoms felt similar, though not as severe, to her prior angina leading up to PCI in 2013.  She was without frank chest pain.  Given concerning symptoms, she underwent LHC on 05/24/2022 which showed moderately to severely calcified coronary arteries with significant one-vessel CAD.  There was 95% stenosis in the proximal/mid RCA just before the previously placed stent.  LVEDP was upper limit of normal.  She underwent successful PCI/DES to the RCA overlapping with the previously placed stent.  Post-cath labs notable for a hemoglobin of 9.4.  Carvedilol was decreased secondary to bradycardia.     She was seen in the office on 05/30/2022 and noted improvement in her dyspnea following PCI to the RCA.  She was frustrated as she continued to be fatigued, though this is subsequently improved with cardiac rehab.  Bradycardia improved following the tapering of carvedilol.  Echo on 06/22/2022 demonstrated an EF of 60 to 65%, no regional wall motion abnormalities, moderate LVH, grade 1 diastolic dysfunction, normal RV systolic function and ventricular cavity size, mildly dilated left atrium, mild mitral regurgitation, aortic valve sclerosis without evidence of stenosis, and an estimated right atrial pressure of 3 mmHg.  She was last seen in the office in 11/2022 and remained without symptoms of angina or cardiac decompensation.  She indicated she was feeling the best she had in 10 years.  She was subsequently rechallenged on carvedilol due to elevated BPs, though again reported intolerance leading to its discontinuation.  However, more recently carvedilol was  again restarted in 02/2023 due to continued elevated BP readings.  More recently, she dropped off BP readings on 04/10/2023 readings typically in the 130s to 150s systolic with an occasional reading in the 170s with rare readings in the 180s to 190s over 70s to 90s with heart rates  largely in the 60s to 70s bpm with occasional reading in the upper 50s bpm.  At that time, she reported taking carvedilol 3.125 mg twice daily, amlodipine 10 mg daily, valsartan 320 mg daily, and spironolactone 12.5 mg daily.  It was recommended she titrate spironolactone to 25 mg daily.  Carotid artery ultrasound from 04/10/2023 showed showed 80 to 99% left ICA stenosis with 1 to 39% right ICA stenosis.  This was a progression of the left ICA stenosis that was previously noted to be 60 to 79% in 02/2022.  She comes in today noting an increase in fatigue dyspnea, and dizziness following the initiation of carvedilol.  These are symptoms are similar to what she experienced when on carvedilol initially.  Indicates in the setting of this that she is more sedentary.  She is without symptoms of frank angina or cardiac decompensation.  No presyncope or syncope.  No falls, hematochezia, or melena.  She has not yet titrated to spironolactone to 25 mg daily.   Labs independently reviewed: 12/2022 - TSH normal, A1c 6.0, Hgb 11.6, PLT 272, BUN 25, serum creatinine 1.25, potassium 4.4, albumin 4.4, magnesium 1.7 07/2022 - TC 160, TG 122, HDL 69, LDL 70 01/2022 - AST/ALT normal  Past Medical History:  Diagnosis Date   Abdominal hernia    Arthritis    Asthma    Carotid arterial disease (HCC)    a. 11/2020 Carotid U/S: Less than 50% RICA, 50-69% LICA.   CKD (chronic kidney disease), stage III (HCC)    Coronary artery disease    a. 2013 s/p PCI and stent placement in [facility] done for stable angina.   GI bleeding 02/02/2021   Hyperlipidemia    Hypertension    Hypothyroidism    PVC's (premature ventricular contractions)    a. 01/2020 Zio: RSR, 70, frequent PVCs with 10.5% burden.   Stroke [facility])    a. 2017 - ILR did not show afib.    Past Surgical History:  Procedure Laterality Date   ABDOMINAL HYSTERECTOMY     APPENDECTOMY     CATARACT EXTRACTION Bilateral    CHOLECYSTECTOMY      COLONOSCOPY WITH PROPOFOL N/A 04/06/2021   Procedure: COLONOSCOPY WITH PROPOFOL;  Surgeon: Toledo, Boykin Nearing, MD;  Location: [facility] ENDOSCOPY;  Service: Gastroenterology;  Laterality: N/A;   CORONARY ANGIOPLASTY  2013   1xStent [facility] [place].   CORONARY STENT INTERVENTION N/A 05/24/2022   Procedure: CORONARY STENT INTERVENTION;  Surgeon: Iran Ouch, MD;  Location: [facility] INVASIVE CV LAB;  Service: Cardiovascular;  Laterality: N/A;   EYE SURGERY     HAND SURGERY Right    LEFT HEART CATH AND CORONARY ANGIOGRAPHY Left 05/24/2022   Procedure: LEFT HEART CATH AND CORONARY ANGIOGRAPHY;  Surgeon: Iran Ouch, MD;  Location: [facility] INVASIVE CV LAB;  Service: Cardiovascular;  Laterality: Left;   LOOP RECORDER IMPLANT     NISSEN FUNDOPLICATION  2016   REPLACEMENT TOTAL KNEE Bilateral 2017   TOTAL HIP ARTHROPLASTY Right    TOTAL VAGINAL HYSTERECTOMY      Current Medications: Current Meds  Medication Sig   albuterol (VENTOLIN HFA) 108 (90 Base) MCG/ACT inhaler Inhale 2 puffs into the lungs every  4 (four) hours as needed for wheezing or shortness of breath.   amLODipine (NORVASC) 10 MG tablet Take 1 tablet (10 mg total) by mouth daily.   aspirin EC 81 MG tablet Take 81 mg by mouth daily.   budesonide-formoterol (SYMBICORT) 160-4.5 MCG/ACT inhaler Inhale 2 puffs into the lungs 2 (two) times daily.   cetirizine (ZYRTEC) 10 MG tablet Take 1 tablet (10 mg total) by mouth daily.   clopidogrel (PLAVIX) 75 MG tablet Take 1 tablet (75 mg total) by mouth daily.   fluticasone (FLONASE) 50 MCG/ACT nasal spray Place 2 sprays into both nostrils daily.   hydrochlorothiazide (HYDRODIURIL) 25 MG tablet Take 1 tablet (25 mg total) by mouth daily.   levothyroxine (SYNTHROID) 75 MCG tablet Take 1 tablet (75 mcg total) by mouth daily before breakfast.   meclizine (ANTIVERT) 25 MG tablet Take 0.5 tablets (12.5 mg total) by mouth 3 (three) times daily as needed for dizziness or nausea.   methocarbamol (ROBAXIN)  500 MG tablet Take 1 tablet (500 mg total) by mouth at bedtime as needed for muscle spasms.   montelukast (SINGULAIR) 10 MG tablet Take 1 tablet (10 mg total) by mouth at bedtime.   omeprazole (PRILOSEC) 40 MG capsule Take 1 capsule (40 mg total) by mouth 2 (two) times daily.   rosuvastatin (CRESTOR) 20 MG tablet Take 1 tablet (20 mg total) by mouth daily.   spironolactone (ALDACTONE) 25 MG tablet Take 1 tablet (25 mg total) by mouth daily.   Turmeric 500 MG TABS Take 1,000 mg by mouth daily.   valsartan (DIOVAN) 320 MG tablet TAKE 1 TABLET BY MOUTH EVERY DAY   [DISCONTINUED] carvedilol (COREG) 3.125 MG tablet Take 3.125 mg by mouth 2 (two) times daily with a meal.   [DISCONTINUED] spironolactone (ALDACTONE) 25 MG tablet TAKE 1/2 TABLET BY MOUTH EVERY DAY    Allergies:   Patient has no known allergies.   Social History   Socioeconomic History   Marital status: Widowed    Spouse name: Not on file   Number of children: 2   Years of education: Not on file   Highest education level: Some college, no degree  Occupational History    Comment: retired Photographer  Tobacco Use   Smoking status: Never   Smokeless tobacco: Never  Vaping Use   Vaping Use: Never used  Substance and Sexual Activity   Alcohol use: Not Currently    Comment: Occasional   Drug use: Never   Sexual activity: Never    Birth control/protection: Surgical  Other Topics Concern   Not on file  Social History Narrative   Moved here in 2019, had been living in [place].    Born here, raised daughters here.    Lives with daughter now, [place].   Lost one daughter in car wreck.    Social Determinants of Health   Financial Resource Strain: Low Risk  (11/29/2021)   Overall Financial Resource Strain (CARDIA)    Difficulty of Paying Living Expenses: Not hard at all  Food Insecurity: No Food Insecurity (11/29/2021)   Hunger Vital Sign    Worried About Running Out of Food in the Last Year: Never true    Ran Out of Food  in the Last Year: Never true  Transportation Needs: No Transportation Needs (11/29/2021)   PRAPARE - Administrator, Civil Service (Medical): No    Lack of Transportation (Non-Medical): No  Physical Activity: Insufficiently Active (11/29/2021)   Exercise Vital Sign    Days  of Exercise per Week: 2 days    Minutes of Exercise per Session: 20 min  Stress: No Stress Concern Present (11/29/2021)   [employer] of Occupational Health - Occupational Stress Questionnaire    Feeling of Stress : Not at all  Social Connections: Moderately Integrated (11/29/2021)   Social Connection and Isolation Panel [NHANES]    Frequency of Communication with Friends and Family: Three times a week    Frequency of Social Gatherings with Friends and Family: Twice a week    Attends Religious Services: More than 4 times per year    Active Member of [employer] or Organizations: Yes    Attends Banker Meetings: More than 4 times per year    Marital Status: Widowed     Family History:  The patient's family history includes Heart Problems in her brother, father, and mother; Heart attack in her brother and mother. There is no history of Colon cancer or Breast cancer.  ROS:   12-point review of systems is negative unless otherwise noted in the HPI.   EKGs/Labs/Other Studies Reviewed:    Studies reviewed were summarized above. The additional studies were reviewed today:  Carotid artery ultrasound 04/10/2023: Summary:  Right Carotid: Velocities in the right ICA are consistent with a 1-39% stenosis.   Left Carotid: Velocities in the left ICA are consistent with a 80-99% stenosis. The ECA appears >50% stenosed.  __________  2D echo 06/22/2022: 1. Left ventricular ejection fraction, by estimation, is 60 to 65%. The  left ventricle has normal function. The left ventricle has no regional  wall motion abnormalities. There is moderate left ventricular hypertrophy.  Left ventricular diastolic   parameters are consistent with Grade I diastolic dysfunction (impaired  relaxation).   2. Right ventricular systolic function is normal. The right ventricular  size is normal.   3. Left atrial size was mildly dilated.   4. The mitral valve is normal in structure. Mild mitral valve  regurgitation. No evidence of mitral stenosis.   5. The aortic valve is normal in structure. Aortic valve regurgitation is  not visualized. Aortic valve sclerosis/calcification is present, without  any evidence of aortic stenosis.   6. The inferior vena cava is normal in size with greater than 50%  respiratory variability, suggesting right atrial pressure of 3 mmHg. __________   LHC 05/24/2022:   Mid RCA lesion is 10% stenosed.   Prox RCA to Mid RCA lesion is 95% stenosed.   Prox LAD to Mid LAD lesion is 20% stenosed.   A drug-eluting stent was successfully placed using a STENT ONYX FRONTIER 3.0X18.   Post intervention, there is a 0% residual stenosis.   1.  Moderately to severely calcified coronary arteries with significant one-vessel coronary artery disease.   There is 95% stenosis in the proximal/mid right coronary artery just before the previously placed stent.   2.  Left ventricular angiography was not performed due to chronic kidney disease.  LVEDP was at the upper limit of normal. 3.  Successful angioplasty and drug-eluting stent placement to the right coronary artery overlapping with the previously placed stent. 4.  Difficult catheterization via the right radial artery due to significant tortuosity of the innominate and right subclavian arteries.  Consider alternative route in the future.   Recommendations: Dual antiplatelet therapy for at least 6 months. Aggressive treatment of risk factors. __________   Carotid artery ultrasound 02/23/2022: Summary:  Right Carotid: Velocities in the right ICA are consistent with a 1-39%  stenosis. Non-hemodynamically significant  plaque <50% noted in the  CCA.  The ECA appears <50% stenosed.   Left Carotid: Velocities in the left ICA are consistent with a 60-79%  stenosis. Non-hemodynamically significant plaque <50% noted in the  CCA. The ECA appears >50% stenosed.      The bilateral lobes of the thyroid had a globuated  appearance   Suggest follow up study in 12 months.  __________   Renal artery ultrasound 07/16/2020: Summary:  Largest Aortic Diameter: 2.0 cm     Renal:     Right: Normal size right kidney. Normal right Resisitive Index.         Normal cortical thickness of right kidney. No evidence of         right renal artery stenosis. RRV flow present.  Left:  LRV flow present. No evidence of left renal artery stenosis.         Normal size of left kidney. Normal left Resistive Index.         Normal cortical thickness of the left kidney. __________   Outpatient cardiac monitoring 01/2020: Normal sinus rhythm with an average heart rate of 70 bpm. 1 short run of SVT lasted 4 beats. Frequent PVCs with a burden of 10.5%.  Ventricular bigeminy and trigeminy were present.   EKG:  EKG is ordered today.  The EKG ordered today demonstrates NSR, 65 bpm, no acute ST-T changes  Recent Labs: 07/17/2022: BUN 22; Creatinine, Ser 1.33; Hemoglobin 11.7; Platelets 314; Potassium 4.3; Sodium 140 12/15/2022: TSH 3.300  Recent Lipid Panel    Component Value Date/Time   CHOL 160 07/17/2022 0903   TRIG 122 07/17/2022 0903   HDL 69 07/17/2022 0903   CHOLHDL 2.3 07/17/2022 0903   CHOLHDL 3 12/19/2019 0957   VLDL 36.4 12/19/2019 0957   LDLCALC 70 07/17/2022 0903    PHYSICAL EXAM:    VS:  BP (!) 153/84 (BP Location: Left Arm, Patient Position: Sitting, Cuff Size: Normal)   Pulse 64   Ht 5' (1.524 m)   Wt 142 lb 3.2 oz (64.5 kg)   SpO2 97%   BMI 27.77 kg/m   BMI: Body mass index is 27.77 kg/m.  Physical Exam Vitals reviewed.  Constitutional:      Appearance: She is well-developed.  HENT:     Head: Normocephalic and atraumatic.  Eyes:      General:        Right eye: No discharge.        Left eye: No discharge.  Neck:     Vascular: No JVD.  Cardiovascular:     Rate and Rhythm: Normal rate and regular rhythm.     Pulses:          Carotid pulses are  on the left side with bruit.      Posterior tibial pulses are 2+ on the right side and 2+ on the left side.     Heart sounds: Normal heart sounds, S1 normal and S2 normal. Heart sounds not distant. No midsystolic click and no opening snap. No murmur heard.    No friction rub.  Pulmonary:     Effort: Pulmonary effort is normal. No respiratory distress.     Breath sounds: Normal breath sounds. No decreased breath sounds, wheezing or rales.  Chest:     Chest wall: No tenderness.  Abdominal:     General: There is no distension.  Musculoskeletal:     Cervical back: Normal range of motion.     Right lower leg: No edema.  Left lower leg: No edema.  Skin:    General: Skin is warm and dry.     Nails: There is no clubbing.  Neurological:     Mental Status: She is alert and oriented to person, place, and time.  Psychiatric:        Speech: Speech normal.        Behavior: Behavior normal.        Thought Content: Thought content normal.        Judgment: Judgment normal.     Wt Readings from Last 3 Encounters:  04/12/23 142 lb 3.2 oz (64.5 kg)  12/15/22 141 lb (64 kg)  11/30/22 141 lb 3.2 oz (64 kg)     ASSESSMENT & PLAN:   CAD involving the native coronary arteries without angina: She is without symptoms of angina or cardiac decompensation.  Continue secondary prevention and aggressive risk factor modification including DAPT with aspirin and clopidogrel along with amlodipine, rosuvastatin, and valsartan.  Carotid artery stenosis: Most recent carotid artery ultrasound from 04/10/2023 showed progression of LICA up to 80 to 99% stenosis (previously 60 to 79% stenosis in 02/2022).  Schedule stat CTA head and neck with urgent referral to vascular surgery in [place].  She  remains on aspirin, clopidogrel, and rosuvastatin.  HTN: Blood pressure remains mildly elevated.  She is intolerant to carvedilol with return of fatigue, dyspnea, dizziness following rechallenge.  Discontinue carvedilol.  Start HCTZ 25 mg daily.  Titrate spironolactone to 25 mg daily.  Otherwise continue amlodipine 10 mg and valsartan 320 mg.  Follow-up BMP 1 week.  Low-sodium diet encouraged.  HLD: LDL 70 in 07/2022.  She remains on rosuvastatin 20 mg.  PVCs: Quiescent.  Discontinuing beta-blocker secondary to intolerance as outlined above.  History of CVA: No new deficits.  She remains on aspirin, clopidogrel, and rosuvastatin.  History of GI bleed/iron deficiency anemia: Hemoglobin stable.  No symptoms concerning for recurrence.  CKD stage III: Stable.   Disposition: F/u with Dr. Kirke Corin or an APP in 2 months.   Medication Adjustments/Labs and Tests Ordered: Current medicines are reviewed at length with the patient today.  Concerns regarding medicines are outlined above. Medication changes, Labs and Tests ordered today are summarized above and listed in the Patient Instructions accessible in Encounters.   Signed, Eula Listen, PA-C 04/12/2023 4:39 PM     [facility] [place] [address] [place], [place] [address] [phone]

## 2023-04-11 NOTE — Telephone Encounter (Signed)
Left message on patient's daughter's voicemail stating the following:  Recommendations: -Increase spironolactone to 25 mg daily with continuation of carvedilol 3.125 mg twice daily, amlodipine 10 mg daily, and valsartan 320 mg daily -Keep follow-up appointment scheduled for later this week  Instructed them to call back with any questions/concerns and if not we would see them Thursday 04/12/2023 at 3:10 PM for their appointment

## 2023-04-12 ENCOUNTER — Ambulatory Visit
Admission: RE | Admit: 2023-04-12 | Discharge: 2023-04-12 | Disposition: A | Discharge status: Home / Self Care | Payer: Medicare Other | Source: Ambulatory Visit | Attending: Physician Assistant | Admitting: Physician Assistant

## 2023-04-12 ENCOUNTER — Encounter: Payer: Self-pay | Admitting: Family Medicine

## 2023-04-12 ENCOUNTER — Ambulatory Visit: Payer: Medicare Other | Attending: Physician Assistant | Admitting: Physician Assistant

## 2023-04-12 ENCOUNTER — Encounter: Payer: Self-pay | Admitting: Physician Assistant

## 2023-04-12 VITALS — BP 153/84 | HR 64 | Ht 60.0 in | Wt 142.2 lb

## 2023-04-12 DIAGNOSIS — R519 Headache, unspecified: Secondary | ICD-10-CM | POA: Diagnosis not present

## 2023-04-12 DIAGNOSIS — I493 Ventricular premature depolarization: Secondary | ICD-10-CM | POA: Diagnosis not present

## 2023-04-12 DIAGNOSIS — I6523 Occlusion and stenosis of bilateral carotid arteries: Secondary | ICD-10-CM | POA: Diagnosis not present

## 2023-04-12 DIAGNOSIS — Z8719 Personal history of other diseases of the digestive system: Secondary | ICD-10-CM | POA: Diagnosis not present

## 2023-04-12 DIAGNOSIS — R42 Dizziness and giddiness: Secondary | ICD-10-CM | POA: Diagnosis not present

## 2023-04-12 DIAGNOSIS — N1832 Chronic kidney disease, stage 3b: Secondary | ICD-10-CM | POA: Insufficient documentation

## 2023-04-12 DIAGNOSIS — I2510 Atherosclerotic heart disease of native coronary artery without angina pectoris: Secondary | ICD-10-CM | POA: Insufficient documentation

## 2023-04-12 DIAGNOSIS — J4520 Mild intermittent asthma, uncomplicated: Secondary | ICD-10-CM

## 2023-04-12 DIAGNOSIS — Z8673 Personal history of transient ischemic attack (TIA), and cerebral infarction without residual deficits: Secondary | ICD-10-CM | POA: Insufficient documentation

## 2023-04-12 DIAGNOSIS — I10 Essential (primary) hypertension: Secondary | ICD-10-CM | POA: Diagnosis not present

## 2023-04-12 DIAGNOSIS — E785 Hyperlipidemia, unspecified: Secondary | ICD-10-CM | POA: Insufficient documentation

## 2023-04-12 LAB — POCT I-STAT CREATININE: Creatinine, Ser: 1.3 mg/dL — ABNORMAL HIGH (ref 0.44–1.00)

## 2023-04-12 MED ORDER — SPIRONOLACTONE 25 MG PO TABS: 25.0000 mg | ORAL_TABLET | Freq: Every day | ORAL | 3 refills | Status: DC

## 2023-04-12 MED ORDER — HYDROCHLOROTHIAZIDE 25 MG PO TABS: 25.0000 mg | ORAL_TABLET | Freq: Every day | ORAL | 3 refills | Status: DC

## 2023-04-12 MED ORDER — IOHEXOL 350 MG/ML SOLN
75.0000 mL | Freq: Once | INTRAVENOUS | Status: AC | PRN
  Administered 2023-04-12: 75 mL via INTRAVENOUS

## 2023-04-12 NOTE — Patient Instructions (Signed)
Medication Instructions:  Your physician has recommended you make the following change in your medication:   STOP Carvedilol (Coreg) START Hydrochlorothiazide 25 mg once daily  INCREASE Spironolactone to 25 mg once daily   *If you need a refill on your cardiac medications before your next appointment, please call your pharmacy*   Lab Work: None  If you have labs (blood work) drawn today and your tests are completely normal, you will receive your results only by: MyChart Message (if you have MyChart) OR A paper copy in the mail If you have any lab test that is abnormal or we need to change your treatment, we will call you to review the results.   Testing/Procedures: STAT CTA of Head & Neck have been ordered.   STAT Referral has also been placed for Vascular & Vein specialists in [place].   # [phone]   Follow-Up: At [facility], you and your health needs are our priority.  As part of our continuing mission to provide you with exceptional heart care, we have created designated Provider Care Teams.  These Care Teams include your primary Cardiologist (physician) and Advanced Practice Providers (APPs -  Physician Assistants and Nurse Practitioners) who all work together to provide you with the care you need, when you need it.   Your next appointment:   2 month(s)  Provider:   Eula Listen, PA-C

## 2023-04-12 NOTE — Addendum Note (Signed)
Addended by: Bryna Colander on: 04/12/2023 04:47 PM   Modules accepted: Orders

## 2023-04-12 NOTE — Progress Notes (Signed)
Called over to [facility] Vascular & Vein specialists in [place] and they received referral and will have providers review.

## 2023-04-13 MED ORDER — ALBUTEROL SULFATE HFA 108 (90 BASE) MCG/ACT IN AERS
2.0000 | INHALATION_SPRAY | RESPIRATORY_TRACT | 11 refills | Status: DC | PRN
Start: 2023-04-13 — End: 2024-06-16

## 2023-04-13 NOTE — Addendum Note (Signed)
Addended by: Auburn Bilberry D on: 04/13/2023 04:13 PM   Modules accepted: Orders

## 2023-04-16 ENCOUNTER — Other Ambulatory Visit: Payer: Self-pay

## 2023-04-16 ENCOUNTER — Ambulatory Visit (INDEPENDENT_AMBULATORY_CARE_PROVIDER_SITE_OTHER): Payer: Medicare Other | Admitting: Surgery

## 2023-04-16 ENCOUNTER — Encounter: Payer: Self-pay | Admitting: Surgery

## 2023-04-16 VITALS — BP 134/84 | HR 75 | Temp 98.1°F | Resp 20 | Ht 60.0 in | Wt 142.0 lb

## 2023-04-16 DIAGNOSIS — I6523 Occlusion and stenosis of bilateral carotid arteries: Secondary | ICD-10-CM | POA: Diagnosis not present

## 2023-04-16 NOTE — H&P (View-Only) (Signed)
Vascular and Vein Specialist of [facility]  Patient name: Andrea Hart MRN: [id number] DOB: Dec 25, 1935 Sex: female   REQUESTING PROVIDER:    Eula Listen   REASON FOR CONSULT:    Carotid disease  HISTORY OF PRESENT ILLNESS:   Andrea Hart is a 87 y.o. female, who is referred for evaluation of carotid stenosis.  Ultrasound from 04/10/2023 shows a 80-99% left-sided stenosis.  She has had a CT scan as well, showing high-grade left-sided stenosis.  She is asymptomatic.  Specifically, she denies numbness or weakness in either extremity.  She denies slurred speech.  She denies amaurosis fugax.   The patient suffers from coronary artery disease, status post PCI.  She has had 2 separate strokes.  She is medically managed for hypertension.  She takes a statin for hyperlipidemia she is a non-smoker.  She has a history of GI bleeding in 2022.  PAST MEDICAL HISTORY    Past Medical History:  Diagnosis Date   Abdominal hernia    Arthritis    Asthma    Carotid arterial disease (HCC)    a. 11/2020 Carotid U/S: Less than 50% RICA, 50-69% LICA.   CKD (chronic kidney disease), stage III (HCC)    Coronary artery disease    a. 2013 s/p PCI and stent placement in [facility] done for stable angina.   GI bleeding 02/02/2021   Hyperlipidemia    Hypertension    Hypothyroidism    PVC's (premature ventricular contractions)    a. 01/2020 Zio: RSR, 70, frequent PVCs with 10.5% burden.   Stroke [facility])    a. 2017 - ILR did not show afib.     FAMILY HISTORY   Family History  Problem Relation Age of Onset   Heart Problems Mother    Heart attack Mother    Heart Problems Father    Heart attack Brother    Heart Problems Brother    Colon cancer Neg Hx    Breast cancer Neg Hx     SOCIAL HISTORY:   Social History   Socioeconomic History   Marital status: Widowed    Spouse name: Not on file   Number of children: 2   Years of education: Not on file    Highest education level: Some college, no degree  Occupational History    Comment: retired Photographer  Tobacco Use   Smoking status: Never   Smokeless tobacco: Never  Vaping Use   Vaping Use: Never used  Substance and Sexual Activity   Alcohol use: Not Currently    Comment: Occasional   Drug use: Never   Sexual activity: Never    Birth control/protection: Surgical  Other Topics Concern   Not on file  Social History Narrative   Moved here in 2019, had been living in [place].    Born here, raised daughters here.    Lives with daughter now, [place].   Lost one daughter in car wreck.    Social Determinants of Health   Financial Resource Strain: Low Risk  (11/29/2021)   Overall Financial Resource Strain (CARDIA)    Difficulty of Paying Living Expenses: Not hard at all  Food Insecurity: No Food Insecurity (11/29/2021)   Hunger Vital Sign    Worried About Running Out of Food in the Last Year: Never true    Ran Out of Food in the Last Year: Never true  Transportation Needs: No Transportation Needs (11/29/2021)   PRAPARE - Administrator, Civil Service (Medical): No  Lack of Transportation (Non-Medical): No  Physical Activity: Insufficiently Active (11/29/2021)   Exercise Vital Sign    Days of Exercise per Week: 2 days    Minutes of Exercise per Session: 20 min  Stress: No Stress Concern Present (11/29/2021)   [employer] of Occupational Health - Occupational Stress Questionnaire    Feeling of Stress : Not at all  Social Connections: Moderately Integrated (11/29/2021)   Social Connection and Isolation Panel [NHANES]    Frequency of Communication with Friends and Family: Three times a week    Frequency of Social Gatherings with Friends and Family: Twice a week    Attends Religious Services: More than 4 times per year    Active Member of [employer] or Organizations: Yes    Attends Banker Meetings: More than 4 times per year    Marital Status:  Widowed  Intimate Partner Violence: Not At Risk (11/29/2021)   Humiliation, Afraid, Rape, and Kick questionnaire    Fear of Current or Ex-Partner: No    Emotionally Abused: No    Physically Abused: No    Sexually Abused: No    ALLERGIES:    No Known Allergies  CURRENT MEDICATIONS:    Current Outpatient Medications  Medication Sig Dispense Refill   albuterol (VENTOLIN HFA) 108 (90 Base) MCG/ACT inhaler Inhale 2 puffs into the lungs every 4 (four) hours as needed for wheezing or shortness of breath. 6.7 g 11   amLODipine (NORVASC) 10 MG tablet Take 1 tablet (10 mg total) by mouth daily. 90 tablet 1   aspirin EC 81 MG tablet Take 81 mg by mouth daily.     budesonide-formoterol (SYMBICORT) 160-4.5 MCG/ACT inhaler Inhale 2 puffs into the lungs 2 (two) times daily. 3 each 3   cetirizine (ZYRTEC) 10 MG tablet Take 1 tablet (10 mg total) by mouth daily. 90 tablet 3   clopidogrel (PLAVIX) 75 MG tablet Take 1 tablet (75 mg total) by mouth daily. 90 tablet 3   fluticasone (FLONASE) 50 MCG/ACT nasal spray Place 2 sprays into both nostrils daily. 16 g 6   hydrochlorothiazide (HYDRODIURIL) 25 MG tablet Take 1 tablet (25 mg total) by mouth daily. 90 tablet 3   levothyroxine (SYNTHROID) 75 MCG tablet Take 1 tablet (75 mcg total) by mouth daily before breakfast. 90 tablet 1   meclizine (ANTIVERT) 25 MG tablet Take 0.5 tablets (12.5 mg total) by mouth 3 (three) times daily as needed for dizziness or nausea. 30 tablet 1   methocarbamol (ROBAXIN) 500 MG tablet Take 1 tablet (500 mg total) by mouth at bedtime as needed for muscle spasms. 90 tablet 1   montelukast (SINGULAIR) 10 MG tablet Take 1 tablet (10 mg total) by mouth at bedtime. 90 tablet 1   omeprazole (PRILOSEC) 40 MG capsule Take 1 capsule (40 mg total) by mouth 2 (two) times daily. 180 capsule 1   rosuvastatin (CRESTOR) 20 MG tablet Take 1 tablet (20 mg total) by mouth daily. 90 tablet 1   spironolactone (ALDACTONE) 25 MG tablet Take 1 tablet  (25 mg total) by mouth daily. 90 tablet 3   Turmeric 500 MG TABS Take 1,000 mg by mouth daily.     valsartan (DIOVAN) 320 MG tablet TAKE 1 TABLET BY MOUTH EVERY DAY 90 tablet 0   No current facility-administered medications for this visit.    REVIEW OF SYSTEMS:   [X]  denotes positive finding, [ ]  denotes negative finding Cardiac  Comments:  Chest pain or chest pressure:  Shortness of breath upon exertion:    Short of breath when lying flat:    Irregular heart rhythm:        Vascular    Pain in calf, thigh, or hip brought on by ambulation:    Pain in feet at night that wakes you up from your sleep:     Blood clot in your veins:    Leg swelling:         Pulmonary    Oxygen at home:    Productive cough:     Wheezing:         Neurologic    Sudden weakness in arms or legs:     Sudden numbness in arms or legs:     Sudden onset of difficulty speaking or slurred speech:    Temporary loss of vision in one eye:     Problems with dizziness:         Gastrointestinal    Blood in stool:      Vomited blood:         Genitourinary    Burning when urinating:     Blood in urine:        Psychiatric    Major depression:         Hematologic    Bleeding problems:    Problems with blood clotting too easily:        Skin    Rashes or ulcers:        Constitutional    Fever or chills:     PHYSICAL EXAM:   Vitals:   04/16/23 1350 04/16/23 1353  BP: (!) 146/77 134/84  Pulse: 75   Resp: 20   Temp: 98.1 F (36.7 C)   SpO2: 95%   Weight: 142 lb (64.4 kg)   Height: 5' (1.524 m)     GENERAL: The patient is a well-nourished female, in no acute distress. The vital signs are documented above. CARDIAC: There is a regular rate and rhythm.  PULMONARY: Nonlabored respirations MUSCULOSKELETAL: There are no major deformities or cyanosis. NEUROLOGIC: No focal weakness or paresthesias are detected. SKIN: There are no ulcers or rashes noted. PSYCHIATRIC: The patient has a normal  affect.  STUDIES:   I have reviewed the following:  Right Carotid: Velocities in the right ICA are consistent with a 1-39%  stenosis.   Left Carotid: Velocities in the left ICA are consistent with a 80-99%  stenosis.               The ECA appears >50% stenosed.    CTA: 1. 75% stenosis of the proximal left internal carotid artery, just beyond the bifurcation. 2. Atherosclerotic calcifications at the right carotid bifurcation and cavernous internal carotid arteries without significant stenosis relative to the more distal vessels. 3. Normal variant CTA Circle of Willis without significant proximal stenosis, aneurysm, or branch vessel occlusion. 4. Multilevel degenerative changes of the cervical spine. 5.  Aortic Atherosclerosis (ICD10-I70.0). ASSESSMENT and PLAN   Asymptomatic left carotid stenosis: I discussed with the patient and her daughter that we have 3 options for treating this.  The first being medical management which would be associated with a high risk of stroke.  The second carotid endarterectomy, 3 May carotid stenting.  After I reviewed the CT scan with her, I showed them that the top of the lesion is at the mid to upper level of C2.  Because of her inability to extend her neck, I feel that endarterectomy will have difficulty getting to the  distal extent of the lesion and potentially cause risk to the glossopharyngeal nerve.  Therefore, because of the surgical anatomy I think she is best treated with TCAR.  I discussed the details of the operation including the risk of stroke and bleeding.  All questions were answered.  They wish to proceed at the earliest possible time as she is very anxious about this.  She is already on aspirin and Plavix as well as a statin.   Charlena Cross, MD, FACS Vascular and Vein Specialists of [facility] [phone] Pager [phone]

## 2023-04-16 NOTE — Progress Notes (Signed)
 Vascular and Vein Specialist of [place]  Patient name: Jaileen Z Dellis MRN: [id number] DOB: 06/07/1936 Sex: female   REQUESTING PROVIDER:    Ryan Dunn   REASON FOR CONSULT:    Carotid disease  HISTORY OF PRESENT ILLNESS:   Nataley Z Billig is a 87 y.o. female, who is referred for evaluation of carotid stenosis.  Ultrasound from 04/10/2023 shows a 80-99% left-sided stenosis.  She has had a CT scan as well, showing high-grade left-sided stenosis.  She is asymptomatic.  Specifically, she denies numbness or weakness in either extremity.  She denies slurred speech.  She denies amaurosis fugax.   The patient suffers from coronary artery disease, status post PCI.  She has had 2 separate strokes.  She is medically managed for hypertension.  She takes a statin for hyperlipidemia she is a non-smoker.  She has a history of GI bleeding in 2022.  PAST MEDICAL HISTORY    Past Medical History:  Diagnosis Date   Abdominal hernia    Arthritis    Asthma    Carotid arterial disease (HCC)    a. 11/2020 Carotid U/S: Less than 50% RICA, 50-69% LICA.   CKD (chronic kidney disease), stage III (HCC)    Coronary artery disease    a. 2013 s/p PCI and stent placement in [place] done for stable angina.   GI bleeding 02/02/2021   Hyperlipidemia    Hypertension    Hypothyroidism    PVC's (premature ventricular contractions)    a. 01/2020 Zio: RSR, 70, frequent PVCs with 10.5% burden.   Stroke (HCC)    a. 2017 - ILR did not show afib.     FAMILY HISTORY   Family History  Problem Relation Age of Onset   Heart Problems Mother    Heart attack Mother    Heart Problems Father    Heart attack Brother    Heart Problems Brother    Colon cancer Neg Hx    Breast cancer Neg Hx     SOCIAL HISTORY:   Social History   Socioeconomic History   Marital status: Widowed    Spouse name: Not on file   Number of children: 2   Years of education: Not on file    Highest education level: Some college, no degree  Occupational History    Comment: retired banking  Tobacco Use   Smoking status: Never   Smokeless tobacco: Never  Vaping Use   Vaping Use: Never used  Substance and Sexual Activity   Alcohol use: Not Currently    Comment: Occasional   Drug use: Never   Sexual activity: Never    Birth control/protection: Surgical  Other Topics Concern   Not on file  Social History Narrative   Moved here in 2019, had been living in [place].    Born here, raised daughters here.    Lives with daughter now, [place].   Lost one daughter in car wreck.    Social Determinants of Health   Financial Resource Strain: Low Risk  (11/29/2021)   Overall Financial Resource Strain (CARDIA)    Difficulty of Paying Living Expenses: Not hard at all  Food Insecurity: No Food Insecurity (11/29/2021)   Hunger Vital Sign    Worried About Running Out of Food in the Last Year: Never true    Ran Out of Food in the Last Year: Never true  Transportation Needs: No Transportation Needs (11/29/2021)   PRAPARE - Transportation    Lack of Transportation (Medical): No      Lack of Transportation (Non-Medical): No  Physical Activity: Insufficiently Active (11/29/2021)   Exercise Vital Sign    Days of Exercise per Week: 2 days    Minutes of Exercise per Session: 20 min  Stress: No Stress Concern Present (11/29/2021)   [employer] of Occupational [employer] - Occupational Stress Questionnaire    Feeling of Stress : Not at all  Social Connections: Moderately Integrated (11/29/2021)   Social Connection and Isolation Panel [NHANES]    Frequency of Communication with Friends and Family: Three times a week    Frequency of Social Gatherings with Friends and Family: Twice a week    Attends Religious Services: More than 4 times per year    Active Member of Clubs or Organizations: Yes    Attends Club or Organization Meetings: More than 4 times per year    Marital Status:  Widowed  Intimate Partner Violence: Not At Risk (11/29/2021)   Humiliation, Afraid, Rape, and Kick questionnaire    Fear of Current or Ex-Partner: No    Emotionally Abused: No    Physically Abused: No    Sexually Abused: No    ALLERGIES:    No Known Allergies  CURRENT MEDICATIONS:    Current Outpatient Medications  Medication Sig Dispense Refill   albuterol (VENTOLIN HFA) 108 (90 Base) MCG/ACT inhaler Inhale 2 puffs into the lungs every 4 (four) hours as needed for wheezing or shortness of breath. 6.7 g 11   amLODipine (NORVASC) 10 MG tablet Take 1 tablet (10 mg total) by mouth daily. 90 tablet 1   aspirin EC 81 MG tablet Take 81 mg by mouth daily.     budesonide-formoterol (SYMBICORT) 160-4.5 MCG/ACT inhaler Inhale 2 puffs into the lungs 2 (two) times daily. 3 each 3   cetirizine (ZYRTEC) 10 MG tablet Take 1 tablet (10 mg total) by mouth daily. 90 tablet 3   clopidogrel (PLAVIX) 75 MG tablet Take 1 tablet (75 mg total) by mouth daily. 90 tablet 3   fluticasone (FLONASE) 50 MCG/ACT nasal spray Place 2 sprays into both nostrils daily. 16 g 6   hydrochlorothiazide (HYDRODIURIL) 25 MG tablet Take 1 tablet (25 mg total) by mouth daily. 90 tablet 3   levothyroxine (SYNTHROID) 75 MCG tablet Take 1 tablet (75 mcg total) by mouth daily before breakfast. 90 tablet 1   meclizine (ANTIVERT) 25 MG tablet Take 0.5 tablets (12.5 mg total) by mouth 3 (three) times daily as needed for dizziness or nausea. 30 tablet 1   methocarbamol (ROBAXIN) 500 MG tablet Take 1 tablet (500 mg total) by mouth at bedtime as needed for muscle spasms. 90 tablet 1   montelukast (SINGULAIR) 10 MG tablet Take 1 tablet (10 mg total) by mouth at bedtime. 90 tablet 1   omeprazole (PRILOSEC) 40 MG capsule Take 1 capsule (40 mg total) by mouth 2 (two) times daily. 180 capsule 1   rosuvastatin (CRESTOR) 20 MG tablet Take 1 tablet (20 mg total) by mouth daily. 90 tablet 1   spironolactone (ALDACTONE) 25 MG tablet Take 1 tablet  (25 mg total) by mouth daily. 90 tablet 3   Turmeric 500 MG TABS Take 1,000 mg by mouth daily.     valsartan (DIOVAN) 320 MG tablet TAKE 1 TABLET BY MOUTH EVERY DAY 90 tablet 0   No current facility-administered medications for this visit.    REVIEW OF SYSTEMS:   [X] denotes positive finding, [ ] denotes negative finding Cardiac  Comments:  Chest pain or chest pressure:      Shortness of breath upon exertion:    Short of breath when lying flat:    Irregular heart rhythm:        Vascular    Pain in calf, thigh, or hip brought on by ambulation:    Pain in feet at night that wakes you up from your sleep:     Blood clot in your veins:    Leg swelling:         Pulmonary    Oxygen at home:    Productive cough:     Wheezing:         Neurologic    Sudden weakness in arms or legs:     Sudden numbness in arms or legs:     Sudden onset of difficulty speaking or slurred speech:    Temporary loss of vision in one eye:     Problems with dizziness:         Gastrointestinal    Blood in stool:      Vomited blood:         Genitourinary    Burning when urinating:     Blood in urine:        Psychiatric    Major depression:         Hematologic    Bleeding problems:    Problems with blood clotting too easily:        Skin    Rashes or ulcers:        Constitutional    Fever or chills:     PHYSICAL EXAM:   Vitals:   04/16/23 1350 04/16/23 1353  BP: (!) 146/77 134/84  Pulse: 75   Resp: 20   Temp: 98.1 F (36.7 C)   SpO2: 95%   Weight: 142 lb (64.4 kg)   Height: 5' (1.524 m)     GENERAL: The patient is a well-nourished female, in no acute distress. The vital signs are documented above. CARDIAC: There is a regular rate and rhythm.  PULMONARY: Nonlabored respirations MUSCULOSKELETAL: There are no major deformities or cyanosis. NEUROLOGIC: No focal weakness or paresthesias are detected. SKIN: There are no ulcers or rashes noted. PSYCHIATRIC: The patient has a normal  affect.  STUDIES:   I have reviewed the following:  Right Carotid: Velocities in the right ICA are consistent with a 1-39%  stenosis.   Left Carotid: Velocities in the left ICA are consistent with a 80-99%  stenosis.               The ECA appears >50% stenosed.    CTA: 1. 75% stenosis of the proximal left internal carotid artery, just beyond the bifurcation. 2. Atherosclerotic calcifications at the right carotid bifurcation and cavernous internal carotid arteries without significant stenosis relative to the more distal vessels. 3. Normal variant CTA Circle of Willis without significant proximal stenosis, aneurysm, or branch vessel occlusion. 4. Multilevel degenerative changes of the cervical spine. 5.  Aortic Atherosclerosis (ICD10-I70.0). ASSESSMENT and PLAN   Asymptomatic left carotid stenosis: I discussed with the patient and her daughter that we have 3 options for treating this.  The first being medical management which would be associated with a high risk of stroke.  The second carotid endarterectomy, 3 May carotid stenting.  After I reviewed the CT scan with her, I showed them that the top of the lesion is at the mid to upper level of C2.  Because of her inability to extend her neck, I feel that endarterectomy will have difficulty getting to the   distal extent of the lesion and potentially cause risk to the glossopharyngeal nerve.  Therefore, because of the surgical anatomy I think she is best treated with TCAR.  I discussed the details of the operation including the risk of stroke and bleeding.  All questions were answered.  They wish to proceed at the earliest possible time as she is very anxious about this.  She is already on aspirin and Plavix as well as a statin.   Wells Izaan Kingbird, IV, MD, FACS [facility]ascular and Vein Specialists of [place] Tel [phone] Pager [phone]  

## 2023-04-17 ENCOUNTER — Other Ambulatory Visit: Payer: Self-pay

## 2023-04-17 ENCOUNTER — Encounter (HOSPITAL_COMMUNITY): Payer: Self-pay | Admitting: Surgery

## 2023-04-17 NOTE — Progress Notes (Addendum)
Mrs Andrea Hart denies chest pain or shortness of breath. Patient denies having any s/s of Covid in her household, also denies any known exposure to Covid. Mrs. Andrea Hart denies  any s/s of upper or lower respiratory in the past 8 weeks.   Mrs Andrea Hart's PCP is Dr. Shirlee Latch, cardiologist is Dr. Kirke Corin.I am asking anesthesia PA-C to review.

## 2023-04-18 ENCOUNTER — Encounter (HOSPITAL_COMMUNITY)
Admission: RE | Disposition: A | Discharge status: Home / Self Care | Payer: Self-pay | Source: Home / Self Care | Attending: Surgery

## 2023-04-18 ENCOUNTER — Inpatient Hospital Stay (HOSPITAL_COMMUNITY): Payer: Medicare Other | Admitting: Physician Assistant

## 2023-04-18 ENCOUNTER — Inpatient Hospital Stay (HOSPITAL_COMMUNITY): Payer: Medicare Other

## 2023-04-18 ENCOUNTER — Encounter (HOSPITAL_COMMUNITY): Payer: Self-pay | Admitting: Surgery

## 2023-04-18 ENCOUNTER — Other Ambulatory Visit: Payer: Self-pay

## 2023-04-18 ENCOUNTER — Inpatient Hospital Stay (HOSPITAL_COMMUNITY)
Admission: RE | Admit: 2023-04-18 | Discharge: 2023-04-19 | DRG: 035 | Disposition: A | Discharge status: Home / Self Care | Payer: Medicare Other | Attending: Surgery | Admitting: Surgery

## 2023-04-18 DIAGNOSIS — E039 Hypothyroidism, unspecified: Secondary | ICD-10-CM | POA: Diagnosis present

## 2023-04-18 DIAGNOSIS — N183 Chronic kidney disease, stage 3 unspecified: Secondary | ICD-10-CM | POA: Diagnosis present

## 2023-04-18 DIAGNOSIS — Z79890 Hormone replacement therapy (postmenopausal): Secondary | ICD-10-CM | POA: Diagnosis not present

## 2023-04-18 DIAGNOSIS — Z955 Presence of coronary angioplasty implant and graft: Secondary | ICD-10-CM

## 2023-04-18 DIAGNOSIS — D62 Acute posthemorrhagic anemia: Secondary | ICD-10-CM | POA: Diagnosis not present

## 2023-04-18 DIAGNOSIS — Z79899 Other long term (current) drug therapy: Secondary | ICD-10-CM

## 2023-04-18 DIAGNOSIS — I6523 Occlusion and stenosis of bilateral carotid arteries: Secondary | ICD-10-CM | POA: Diagnosis not present

## 2023-04-18 DIAGNOSIS — Z8673 Personal history of transient ischemic attack (TIA), and cerebral infarction without residual deficits: Secondary | ICD-10-CM | POA: Diagnosis not present

## 2023-04-18 DIAGNOSIS — J45909 Unspecified asthma, uncomplicated: Secondary | ICD-10-CM | POA: Diagnosis present

## 2023-04-18 DIAGNOSIS — Z8249 Family history of ischemic heart disease and other diseases of the circulatory system: Secondary | ICD-10-CM | POA: Diagnosis not present

## 2023-04-18 DIAGNOSIS — Z006 Encounter for examination for normal comparison and control in clinical research program: Secondary | ICD-10-CM | POA: Diagnosis not present

## 2023-04-18 DIAGNOSIS — Z7902 Long term (current) use of antithrombotics/antiplatelets: Secondary | ICD-10-CM

## 2023-04-18 DIAGNOSIS — I25119 Atherosclerotic heart disease of native coronary artery with unspecified angina pectoris: Secondary | ICD-10-CM | POA: Diagnosis not present

## 2023-04-18 DIAGNOSIS — E785 Hyperlipidemia, unspecified: Secondary | ICD-10-CM | POA: Diagnosis present

## 2023-04-18 DIAGNOSIS — I6522 Occlusion and stenosis of left carotid artery: Principal | ICD-10-CM | POA: Diagnosis present

## 2023-04-18 DIAGNOSIS — Z7982 Long term (current) use of aspirin: Secondary | ICD-10-CM | POA: Diagnosis not present

## 2023-04-18 DIAGNOSIS — I2510 Atherosclerotic heart disease of native coronary artery without angina pectoris: Secondary | ICD-10-CM | POA: Diagnosis present

## 2023-04-18 DIAGNOSIS — D638 Anemia in other chronic diseases classified elsewhere: Secondary | ICD-10-CM | POA: Diagnosis not present

## 2023-04-18 DIAGNOSIS — I10 Essential (primary) hypertension: Secondary | ICD-10-CM | POA: Diagnosis not present

## 2023-04-18 DIAGNOSIS — Z7951 Long term (current) use of inhaled steroids: Secondary | ICD-10-CM

## 2023-04-18 DIAGNOSIS — I129 Hypertensive chronic kidney disease with stage 1 through stage 4 chronic kidney disease, or unspecified chronic kidney disease: Secondary | ICD-10-CM | POA: Diagnosis present

## 2023-04-18 HISTORY — DX: Pneumonia, unspecified organism: J18.9

## 2023-04-18 HISTORY — DX: Personal history of other diseases of the digestive system: Z87.19

## 2023-04-18 HISTORY — PX: ULTRASOUND GUIDANCE FOR VASCULAR ACCESS: SHX6516

## 2023-04-18 HISTORY — PX: TRANSCAROTID ARTERY REVASCULARIZATIONÂ: SHX6778

## 2023-04-18 HISTORY — DX: Personal history of other medical treatment: Z92.89

## 2023-04-18 LAB — URINALYSIS, MICROSCOPIC (REFLEX)

## 2023-04-18 LAB — COMPREHENSIVE METABOLIC PANEL
ALT: 18 U/L (ref 0–44)
AST: 23 U/L (ref 15–41)
Albumin: 4.2 g/dL (ref 3.5–5.0)
Alkaline Phosphatase: 62 U/L (ref 38–126)
Anion gap: 14 (ref 5–15)
BUN: 24 mg/dL — ABNORMAL HIGH (ref 8–23)
CO2: 19 mmol/L — ABNORMAL LOW (ref 22–32)
Calcium: 9.8 mg/dL (ref 8.9–10.3)
Chloride: 106 mmol/L (ref 98–111)
Creatinine, Ser: 1.17 mg/dL — ABNORMAL HIGH (ref 0.44–1.00)
GFR, Estimated: 46 mL/min — ABNORMAL LOW (ref >60)
Glucose, Bld: 125 mg/dL — ABNORMAL HIGH (ref 70–99)
Potassium: 4.1 mmol/L (ref 3.5–5.1)
Sodium: 139 mmol/L (ref 135–145)
Total Bilirubin: 0.7 mg/dL (ref 0.3–1.2)
Total Protein: 7.1 g/dL (ref 6.5–8.1)

## 2023-04-18 LAB — CBC
HCT: 35.5 % — ABNORMAL LOW (ref 36.0–46.0)
Hemoglobin: 11.6 g/dL — ABNORMAL LOW (ref 12.0–15.0)
MCH: 29.8 pg (ref 26.0–34.0)
MCHC: 32.7 g/dL (ref 30.0–36.0)
MCV: 91.3 fL (ref 80.0–100.0)
Platelets: 220 10*3/uL (ref 150–400)
RBC: 3.89 MIL/uL (ref 3.87–5.11)
RDW: 13.1 % (ref 11.5–15.5)
WBC: 7.4 10*3/uL (ref 4.0–10.5)
nRBC: 0 % (ref 0.0–0.2)

## 2023-04-18 LAB — TYPE AND SCREEN: Antibody Screen: POSITIVE

## 2023-04-18 LAB — URINALYSIS, ROUTINE W REFLEX MICROSCOPIC
Bilirubin Urine: NEGATIVE
Glucose, UA: NEGATIVE mg/dL
Ketones, ur: NEGATIVE mg/dL
Nitrite: POSITIVE — AB
Protein, ur: NEGATIVE mg/dL
Specific Gravity, Urine: 1.02 (ref 1.005–1.030)
pH: 6 (ref 5.0–8.0)

## 2023-04-18 LAB — PROTIME-INR
INR: 1 (ref 0.8–1.2)
Prothrombin Time: 13.3 seconds (ref 11.4–15.2)

## 2023-04-18 LAB — APTT: aPTT: 25 seconds (ref 24–36)

## 2023-04-18 LAB — BPAM RBC: Unit Type and Rh: 6200

## 2023-04-18 LAB — POCT ACTIVATED CLOTTING TIME: Activated Clotting Time: 304 seconds

## 2023-04-18 SURGERY — TRANSCAROTID ARTERY REVASCULARIZATION (TCAR)
Anesthesia: General | Site: Groin | Laterality: Left

## 2023-04-18 MED ORDER — DEXAMETHASONE SODIUM PHOSPHATE 10 MG/ML IJ SOLN
INTRAMUSCULAR | Status: AC
Start: 2023-04-18 — End: ?
  Filled 2023-04-18: qty 1

## 2023-04-18 MED ORDER — LIDOCAINE 2% (20 MG/ML) 5 ML SYRINGE
INTRAMUSCULAR | Status: AC
Start: 2023-04-18 — End: ?
  Filled 2023-04-18: qty 5

## 2023-04-18 MED ORDER — ROCURONIUM BROMIDE 10 MG/ML (PF) SYRINGE
PREFILLED_SYRINGE | INTRAVENOUS | Status: AC
  Filled 2023-04-18: qty 10

## 2023-04-18 MED ORDER — MONTELUKAST SODIUM 10 MG PO TABS
10.0000 mg | ORAL_TABLET | Freq: Every day | ORAL | Status: DC
  Administered 2023-04-18: 10 mg via ORAL
  Filled 2023-04-18: qty 1

## 2023-04-18 MED ORDER — PHENYLEPHRINE 80 MCG/ML (10ML) SYRINGE FOR IV PUSH (FOR BLOOD PRESSURE SUPPORT)
PREFILLED_SYRINGE | INTRAVENOUS | Status: DC | PRN
  Administered 2023-04-18 (×2): 160 ug via INTRAVENOUS

## 2023-04-18 MED ORDER — CHLORHEXIDINE GLUCONATE CLOTH 2 % EX PADS: 6.0000 | MEDICATED_PAD | Freq: Once | CUTANEOUS | Status: DC

## 2023-04-18 MED ORDER — CEFAZOLIN SODIUM-DEXTROSE 2-4 GM/100ML-% IV SOLN
2.0000 g | Freq: Three times a day (TID) | INTRAVENOUS | Status: AC
  Administered 2023-04-18 – 2023-04-19 (×2): 2 g via INTRAVENOUS
  Filled 2023-04-18 (×2): qty 100

## 2023-04-18 MED ORDER — ORAL CARE MOUTH RINSE: 15.0000 mL | Freq: Once | OROMUCOSAL | Status: AC

## 2023-04-18 MED ORDER — LORATADINE 10 MG PO TABS
10.0000 mg | ORAL_TABLET | Freq: Every day | ORAL | Status: DC
  Administered 2023-04-18: 10 mg via ORAL
  Filled 2023-04-18: qty 1

## 2023-04-18 MED ORDER — LABETALOL HCL 5 MG/ML IV SOLN: 10.0000 mg | INTRAVENOUS | Status: DC | PRN

## 2023-04-18 MED ORDER — HYDRALAZINE HCL 20 MG/ML IJ SOLN: 5.0000 mg | INTRAMUSCULAR | Status: DC | PRN

## 2023-04-18 MED ORDER — DEXAMETHASONE SODIUM PHOSPHATE 10 MG/ML IJ SOLN
INTRAMUSCULAR | Status: AC
  Filled 2023-04-18: qty 1

## 2023-04-18 MED ORDER — ACETAMINOPHEN 10 MG/ML IV SOLN: 1000.0000 mg | Freq: Once | INTRAVENOUS | Status: DC | PRN

## 2023-04-18 MED ORDER — SPIRONOLACTONE 25 MG PO TABS
25.0000 mg | ORAL_TABLET | Freq: Every day | ORAL | Status: DC
  Administered 2023-04-18 – 2023-04-19 (×2): 25 mg via ORAL
  Filled 2023-04-18 (×2): qty 1

## 2023-04-18 MED ORDER — BISACODYL 10 MG RE SUPP: 10.0000 mg | Freq: Every day | RECTAL | Status: DC | PRN

## 2023-04-18 MED ORDER — SODIUM CHLORIDE 0.9 % IV SOLN: 500.0000 mL | Freq: Once | INTRAVENOUS | Status: DC | PRN

## 2023-04-18 MED ORDER — ALUM & MAG HYDROXIDE-SIMETH 200-200-20 MG/5ML PO SUSP: 15.0000 mL | ORAL | Status: DC | PRN

## 2023-04-18 MED ORDER — ASPIRIN 81 MG PO TBEC
81.0000 mg | DELAYED_RELEASE_TABLET | Freq: Every evening | ORAL | Status: DC
  Administered 2023-04-18: 81 mg via ORAL
  Filled 2023-04-18: qty 1

## 2023-04-18 MED ORDER — EPHEDRINE 5 MG/ML INJ
INTRAVENOUS | Status: AC
  Filled 2023-04-18: qty 5

## 2023-04-18 MED ORDER — SODIUM CHLORIDE 0.9 % IV SOLN: INTRAVENOUS | Status: DC

## 2023-04-18 MED ORDER — HEPARIN 6000 UNIT IRRIGATION SOLUTION
Status: DC | PRN
  Administered 2023-04-18: 1

## 2023-04-18 MED ORDER — FENTANYL CITRATE (PF) 250 MCG/5ML IJ SOLN
INTRAMUSCULAR | Status: AC
  Filled 2023-04-18: qty 5

## 2023-04-18 MED ORDER — LEVOTHYROXINE SODIUM 75 MCG PO TABS
75.0000 ug | ORAL_TABLET | Freq: Every day | ORAL | Status: DC
  Administered 2023-04-19: 75 ug via ORAL
  Filled 2023-04-18: qty 1

## 2023-04-18 MED ORDER — PANTOPRAZOLE SODIUM 40 MG PO TBEC
40.0000 mg | DELAYED_RELEASE_TABLET | Freq: Every day | ORAL | Status: DC
  Administered 2023-04-18 – 2023-04-19 (×2): 40 mg via ORAL
  Filled 2023-04-18 (×2): qty 1

## 2023-04-18 MED ORDER — ONDANSETRON HCL 4 MG/2ML IJ SOLN: 4.0000 mg | Freq: Four times a day (QID) | INTRAMUSCULAR | Status: DC | PRN

## 2023-04-18 MED ORDER — FLUTICASONE PROPIONATE 50 MCG/ACT NA SUSP: 2.0000 | Freq: Every day | NASAL | Status: DC | PRN

## 2023-04-18 MED ORDER — ACETAMINOPHEN 325 MG PO TABS
325.0000 mg | ORAL_TABLET | ORAL | Status: DC | PRN
  Administered 2023-04-18 (×2): 650 mg via ORAL
  Filled 2023-04-18 (×2): qty 2

## 2023-04-18 MED ORDER — PROTAMINE SULFATE 10 MG/ML IV SOLN
INTRAVENOUS | Status: AC
  Filled 2023-04-18: qty 5

## 2023-04-18 MED ORDER — PHENYLEPHRINE 80 MCG/ML (10ML) SYRINGE FOR IV PUSH (FOR BLOOD PRESSURE SUPPORT)
PREFILLED_SYRINGE | INTRAVENOUS | Status: AC
  Filled 2023-04-18: qty 10

## 2023-04-18 MED ORDER — LIDOCAINE 2% (20 MG/ML) 5 ML SYRINGE
INTRAMUSCULAR | Status: AC
  Filled 2023-04-18: qty 5

## 2023-04-18 MED ORDER — MAGNESIUM SULFATE 2 GM/50ML IV SOLN: 2.0000 g | Freq: Every day | INTRAVENOUS | Status: DC | PRN

## 2023-04-18 MED ORDER — ONDANSETRON HCL 4 MG/2ML IJ SOLN
INTRAMUSCULAR | Status: AC
  Filled 2023-04-18: qty 2

## 2023-04-18 MED ORDER — TRIAMCINOLONE ACETONIDE 0.1 % EX CREA: 1.0000 | TOPICAL_CREAM | Freq: Two times a day (BID) | CUTANEOUS | Status: DC | PRN

## 2023-04-18 MED ORDER — PROPOFOL 10 MG/ML IV BOLUS
INTRAVENOUS | Status: DC | PRN
  Administered 2023-04-18: 100 mg via INTRAVENOUS

## 2023-04-18 MED ORDER — HEPARIN 6000 UNIT IRRIGATION SOLUTION
Status: AC
  Filled 2023-04-18: qty 500

## 2023-04-18 MED ORDER — DOCUSATE SODIUM 100 MG PO CAPS
100.0000 mg | ORAL_CAPSULE | Freq: Every day | ORAL | Status: DC
  Administered 2023-04-19: 100 mg via ORAL
  Filled 2023-04-18: qty 1

## 2023-04-18 MED ORDER — ROCURONIUM BROMIDE 10 MG/ML (PF) SYRINGE
PREFILLED_SYRINGE | INTRAVENOUS | Status: DC | PRN
  Administered 2023-04-18: 60 mg via INTRAVENOUS

## 2023-04-18 MED ORDER — POLYETHYLENE GLYCOL 3350 17 G PO PACK: 17.0000 g | PACK | Freq: Every day | ORAL | Status: DC | PRN

## 2023-04-18 MED ORDER — MORPHINE SULFATE (PF) 2 MG/ML IV SOLN: 2.0000 mg | INTRAVENOUS | Status: DC | PRN

## 2023-04-18 MED ORDER — DEXAMETHASONE SODIUM PHOSPHATE 10 MG/ML IJ SOLN
INTRAMUSCULAR | Status: DC | PRN
  Administered 2023-04-18: 10 mg via INTRAVENOUS

## 2023-04-18 MED ORDER — HYDROCHLOROTHIAZIDE 25 MG PO TABS
25.0000 mg | ORAL_TABLET | Freq: Every day | ORAL | Status: DC
  Administered 2023-04-18 – 2023-04-19 (×2): 25 mg via ORAL
  Filled 2023-04-18 (×2): qty 1

## 2023-04-18 MED ORDER — FENTANYL CITRATE (PF) 250 MCG/5ML IJ SOLN
INTRAMUSCULAR | Status: DC | PRN
  Administered 2023-04-18 (×2): 50 ug via INTRAVENOUS

## 2023-04-18 MED ORDER — LIDOCAINE 2% (20 MG/ML) 5 ML SYRINGE
INTRAMUSCULAR | Status: DC | PRN
  Administered 2023-04-18: 60 mg via INTRAVENOUS

## 2023-04-18 MED ORDER — EPHEDRINE 5 MG/ML INJ
INTRAVENOUS | Status: AC
  Filled 2023-04-18: qty 10

## 2023-04-18 MED ORDER — CEFAZOLIN SODIUM-DEXTROSE 2-4 GM/100ML-% IV SOLN
2.0000 g | INTRAVENOUS | Status: AC
  Administered 2023-04-18: 2 g via INTRAVENOUS
  Filled 2023-04-18: qty 100

## 2023-04-18 MED ORDER — ALBUTEROL SULFATE HFA 108 (90 BASE) MCG/ACT IN AERS: 2.0000 | INHALATION_SPRAY | RESPIRATORY_TRACT | Status: DC | PRN

## 2023-04-18 MED ORDER — ROSUVASTATIN CALCIUM 20 MG PO TABS
20.0000 mg | ORAL_TABLET | Freq: Every day | ORAL | Status: DC
  Administered 2023-04-18 – 2023-04-19 (×2): 20 mg via ORAL
  Filled 2023-04-18 (×2): qty 1

## 2023-04-18 MED ORDER — MOMETASONE FURO-FORMOTEROL FUM 200-5 MCG/ACT IN AERO
2.0000 | INHALATION_SPRAY | Freq: Two times a day (BID) | RESPIRATORY_TRACT | Status: DC
  Filled 2023-04-18: qty 8.8

## 2023-04-18 MED ORDER — SUGAMMADEX SODIUM 200 MG/2ML IV SOLN
INTRAVENOUS | Status: DC | PRN
  Administered 2023-04-18: 127 mg via INTRAVENOUS

## 2023-04-18 MED ORDER — PROTAMINE SULFATE 10 MG/ML IV SOLN
INTRAVENOUS | Status: DC | PRN
  Administered 2023-04-18: 50 mg via INTRAVENOUS

## 2023-04-18 MED ORDER — AMLODIPINE BESYLATE 10 MG PO TABS
10.0000 mg | ORAL_TABLET | Freq: Every day | ORAL | Status: DC
  Administered 2023-04-19: 10 mg via ORAL
  Filled 2023-04-18: qty 1

## 2023-04-18 MED ORDER — HEMOSTATIC AGENTS (NO CHARGE) OPTIME
TOPICAL | Status: DC | PRN
  Administered 2023-04-18: 1 via TOPICAL

## 2023-04-18 MED ORDER — OXYCODONE-ACETAMINOPHEN 5-325 MG PO TABS: 1.0000 | ORAL_TABLET | ORAL | Status: DC | PRN

## 2023-04-18 MED ORDER — GUAIFENESIN-DM 100-10 MG/5ML PO SYRP: 15.0000 mL | ORAL_SOLUTION | ORAL | Status: DC | PRN

## 2023-04-18 MED ORDER — IRBESARTAN 300 MG PO TABS
300.0000 mg | ORAL_TABLET | Freq: Every day | ORAL | Status: DC
  Administered 2023-04-18 – 2023-04-19 (×2): 300 mg via ORAL
  Filled 2023-04-18 (×2): qty 1

## 2023-04-18 MED ORDER — POTASSIUM CHLORIDE CRYS ER 20 MEQ PO TBCR: 20.0000 meq | EXTENDED_RELEASE_TABLET | Freq: Every day | ORAL | Status: DC | PRN

## 2023-04-18 MED ORDER — METOPROLOL TARTRATE 5 MG/5ML IV SOLN: 2.0000 mg | INTRAVENOUS | Status: DC | PRN

## 2023-04-18 MED ORDER — PHENOL 1.4 % MT LIQD: 1.0000 | OROMUCOSAL | Status: DC | PRN

## 2023-04-18 MED ORDER — LIDOCAINE HCL (PF) 1 % IJ SOLN
INTRAMUSCULAR | Status: AC
  Filled 2023-04-18: qty 5

## 2023-04-18 MED ORDER — IODIXANOL 320 MG/ML IV SOLN
INTRAVENOUS | Status: DC | PRN
  Administered 2023-04-18: 20 mL via INTRA_ARTERIAL

## 2023-04-18 MED ORDER — HEPARIN SODIUM (PORCINE) 1000 UNIT/ML IJ SOLN
INTRAMUSCULAR | Status: DC | PRN
  Administered 2023-04-18: 7000 [IU] via INTRAVENOUS

## 2023-04-18 MED ORDER — GLYCOPYRROLATE PF 0.2 MG/ML IJ SOSY
PREFILLED_SYRINGE | INTRAMUSCULAR | Status: DC | PRN
  Administered 2023-04-18: .2 mg via INTRAVENOUS

## 2023-04-18 MED ORDER — LACTATED RINGERS IV SOLN: INTRAVENOUS | Status: DC

## 2023-04-18 MED ORDER — SODIUM CHLORIDE 0.9 % IV SOLN: INTRAVENOUS | Status: AC

## 2023-04-18 MED ORDER — FENTANYL CITRATE (PF) 100 MCG/2ML IJ SOLN: 25.0000 ug | INTRAMUSCULAR | Status: DC | PRN

## 2023-04-18 MED ORDER — ONDANSETRON HCL 4 MG/2ML IJ SOLN
INTRAMUSCULAR | Status: DC | PRN
  Administered 2023-04-18: 4 mg via INTRAVENOUS

## 2023-04-18 MED ORDER — EPHEDRINE SULFATE-NACL 50-0.9 MG/10ML-% IV SOSY
PREFILLED_SYRINGE | INTRAVENOUS | Status: DC | PRN
  Administered 2023-04-18 (×2): 10 mg via INTRAVENOUS
  Administered 2023-04-18: 5 mg via INTRAVENOUS

## 2023-04-18 MED ORDER — ACETAMINOPHEN 650 MG RE SUPP: 325.0000 mg | RECTAL | Status: DC | PRN

## 2023-04-18 MED ORDER — PHENYLEPHRINE HCL-NACL 20-0.9 MG/250ML-% IV SOLN
INTRAVENOUS | Status: DC | PRN
  Administered 2023-04-18: 50 ug/min via INTRAVENOUS

## 2023-04-18 MED ORDER — CHLORHEXIDINE GLUCONATE 0.12 % MT SOLN
15.0000 mL | Freq: Once | OROMUCOSAL | Status: AC
  Administered 2023-04-18: 15 mL via OROMUCOSAL
  Filled 2023-04-18: qty 15

## 2023-04-18 MED ORDER — CLOPIDOGREL BISULFATE 75 MG PO TABS
75.0000 mg | ORAL_TABLET | Freq: Every day | ORAL | Status: DC
  Administered 2023-04-19: 75 mg via ORAL
  Filled 2023-04-18: qty 1

## 2023-04-18 MED ORDER — MECLIZINE HCL 12.5 MG PO TABS: 12.5000 mg | ORAL_TABLET | Freq: Three times a day (TID) | ORAL | Status: DC | PRN

## 2023-04-18 SURGICAL SUPPLY — 55 items
ADH SKN CLS APL DERMABOND .7 (GAUZE/BANDAGES/DRESSINGS) ×4
AGENT HMST KT MTR STRL THRMB (HEMOSTASIS) ×2
BAG BANDED W/RUBBER/TAPE 36X54 (MISCELLANEOUS) ×2 IMPLANT
BAG COUNTER SPONGE SURGICOUNT (BAG) ×2 IMPLANT
BAG EQP BAND 135X91 W/RBR TAPE (MISCELLANEOUS) ×2
BAG SPNG CNTER NS LX DISP (BAG) ×2
BALLN STERLING RX 5X30X80 (BALLOONS) ×2
BALLOON STERLING RX 5X30X80 (BALLOONS) IMPLANT
CANISTER SUCT 3000ML PPV (MISCELLANEOUS) ×2 IMPLANT
CATH SUCT 10FR WHISTLE TIP (CATHETERS) ×2 IMPLANT
CLIP TI MEDIUM 6 (CLIP) ×2 IMPLANT
CLIP TI WIDE RED SMALL 6 (CLIP) ×2 IMPLANT
COVER DOME SNAP 22 D (MISCELLANEOUS) ×2 IMPLANT
COVER PROBE W GEL 5X96 (DRAPES) ×2 IMPLANT
DERMABOND ADVANCED .7 DNX12 (GAUZE/BANDAGES/DRESSINGS) ×2 IMPLANT
DRAPE C-ARM 42X72 X-RAY (DRAPES) IMPLANT
DRAPE FEMORAL ANGIO 80X135IN (DRAPES) ×2 IMPLANT
ELECT REM PT RETURN 9FT ADLT (ELECTROSURGICAL) ×2
ELECTRODE REM PT RTRN 9FT ADLT (ELECTROSURGICAL) ×2 IMPLANT
GLOVE SURG SS PI 7.5 STRL IVOR (GLOVE) ×6 IMPLANT
GOWN STRL REUS W/ TWL LRG LVL3 (GOWN DISPOSABLE) ×4 IMPLANT
GOWN STRL REUS W/ TWL XL LVL3 (GOWN DISPOSABLE) ×2 IMPLANT
GOWN STRL REUS W/TWL LRG LVL3 (GOWN DISPOSABLE) ×4
GOWN STRL REUS W/TWL XL LVL3 (GOWN DISPOSABLE) ×2
GUIDEWIRE ENROUTE 0.014 (WIRE) ×2 IMPLANT
HEMOSTAT SNOW SURGICEL 2X4 (HEMOSTASIS) IMPLANT
INTRODUCER KIT GALT 7CM (INTRODUCER) ×2
KIT BASIN OR (CUSTOM PROCEDURE TRAY) ×2 IMPLANT
KIT ENCORE 26 ADVANTAGE (KITS) ×2 IMPLANT
KIT INTRODUCER GALT 7 (INTRODUCER) ×2 IMPLANT
KIT TURNOVER KIT B (KITS) ×2 IMPLANT
LOOP VASCULAR MINI 18 RED (MISCELLANEOUS) ×2
NDL HYPO 25GX1X1/2 BEV (NEEDLE) IMPLANT
NEEDLE HYPO 25GX1X1/2 BEV (NEEDLE) IMPLANT
PACK CAROTID (CUSTOM PROCEDURE TRAY) ×2 IMPLANT
POSITIONER HEAD DONUT 9IN (MISCELLANEOUS) ×2 IMPLANT
SET MICROPUNCTURE 5F STIFF (MISCELLANEOUS) ×2 IMPLANT
STENT TRANSCAROTID SYSTEM 8X30 (Permanent Stent) IMPLANT
STENT TRANSCAROTID SYSTEM 9X30 (Permanent Stent) IMPLANT
SURGIFLO W/THROMBIN 8M KIT (HEMOSTASIS) IMPLANT
SUT PROLENE 5 0 C 1 24 (SUTURE) ×4 IMPLANT
SUT SILK 2 0 PERMA HAND 18 BK (SUTURE) ×2 IMPLANT
SUT SILK 2 0 SH (SUTURE) ×2 IMPLANT
SUT VIC AB 3-0 SH 27 (SUTURE) ×4
SUT VIC AB 3-0 SH 27X BRD (SUTURE) ×4 IMPLANT
SUT VIC AB 4-0 PS2 27 (SUTURE) ×2 IMPLANT
SYR 10ML LL (SYRINGE) ×6 IMPLANT
SYR 20ML LL LF (SYRINGE) ×2 IMPLANT
SYR CONTROL 10ML LL (SYRINGE) IMPLANT
SYSTEM TRANSCAROTID NEUROPRTCT (MISCELLANEOUS) ×2 IMPLANT
TOWEL GREEN STERILE (TOWEL DISPOSABLE) ×2 IMPLANT
TRANSCAROTID NEUROPROTECT SYS (MISCELLANEOUS) ×2
VASCULAR TIE MINI RED 18IN STL (MISCELLANEOUS) IMPLANT
WATER STERILE IRR 1000ML POUR (IV SOLUTION) ×2 IMPLANT
WIRE BENTSON .035X145CM (WIRE) ×2 IMPLANT

## 2023-04-18 NOTE — Op Note (Signed)
Patient name: Andrea Hart MRN: [id number] DOB: 02-05-1936 Sex: female  04/18/2023 Pre-operative Diagnosis: Asymptomatic left carotid stenosis Post-operative diagnosis:  Same Surgeon:  Durene Cal Assistants:  Doreatha Massed, PA Procedure:   #1: Left carotid stent (TCAR)   #2: Retrograde flow reversal neuro protection   #3: Ultrasound-guided access, right femoral vein Anesthesia:  General Blood Loss:  minimal Specimens:  none  Findings: Calcified irregular appearing carotid stenosis, greater than 90%, resolved after stenting  Predilation balloon: 5 x 30 Stent: ENROUTE 8 x 30 (distal), 9 x 30 (proximal) Contrast: 20 cc Dose area: 7.63 Fluoroscopy time: 4.08 minutes Procedure time: 50 minutes Flow reversal time: 17 minutes  Indications: This is an 87 year old female with asymptomatic left carotid stenosis.  Because of her inability to extend her neck and the lesion location, I felt that carotid stenting was the best option for her.  Details of the operation were discussed with the patient.  I discussed medical management and endarterectomy.  We elected to proceed with stenting due to anatomic reasons.  Procedure:  The patient was identified in the holding area and taken to [facility] OR ROOM 11  The patient was then placed supine on the table. general anesthesia was administered.  The patient was prepped and draped in the usual sterile fashion.  A time out was called and antibiotics were administered.  A PA was necessary to expedite the procedure and assist with technical details.  She provided suction and retraction to help with exposure.  She help with wire management and sheath removal in the right groin  Ultrasound was used to evaluate the right common femoral vein which was widely patent and easily compressible.  It was cannulated under ultrasound guidance with a micropuncture needle.  A 018 wire was then inserted followed by placement of micropuncture sheath.  Next an 035 wire was placed  followed by the TCAR sheath.  This flushed and aspirated without difficulty.  I used ultrasound to identify the location of the left carotid artery.  A transverse incision was made at the level of the 2 heads of the sternocleidomastoid.  Cautery was used divide subcutaneous tissue and platysma muscle.  I then bluntly separated the muscle fibers and identified an avascular plane into the neck.  I first identified the internal jugular vein.  This was mobilized laterally.  Once this was done I exposed the vagus nerve which was sitting in between the artery and vein.  This was carefully protected.  I then moved medially and identified the common carotid artery which was disease-free.  It was fully mobilized and encircled with a vessel loop and umbilical tape.  Patient was then fully heparinized.  A 5-0 Prolene U-stitch was placed in the adventitia of the carotid artery.  Next, the carotid artery was cannulated with a micropuncture needle.  A 018 wire was inserted up to 4 cm and then a micropuncture sheath was placed 3 cm.  A contrast injection was then performed which located the proximal aspect of the disease which was marked on the screen.  Next, a carotid Amplatz wire was inserted under fluoroscopic visualization to make sure that it did not cross the lesion.  I then placed the TCAR sheath which was sutured into position with 2-0 silk suture.  Next the flow reversal tubing was connected.  Passive flow reversal was initiated and confirmed with a saline flush.  Next, a TCAR timeout was performed.  ACT was greater than 300 and hemodynamics were appropriate.  I  then tighten the vessel loop on the carotid artery, to occlude the artery.  Active flow reversal was then initiated as confirmed by saline flush.  Next, a 018 wire with the support of the predilation 5 x 30 balloon or inserted.  The wire was navigated through the lesion which was heavily calcified and very stenotic, 90%.  The wire tracked up into the internal  carotid artery.  There was no visible external carotid artery.  I then advanced the balloon across the lesion and inflated this to nominal pressure for 30 seconds.  The balloon was removed and a 8 x 30 stent was inserted.  This was then successfully deployed.  Completion imaging showed that the proximal edge of the stent was barely across the proximal aspect of the lesion.  There was also approximately 50% residual stenosis.  Because this area was heavily calcified and because the stent barely cover the lesion, I elected to place a second stent.  This was a 9 x 30.  I then postdilated this with a 5 mm balloon.  I then waited 2 minutes and performed completion angiography which revealed less than 10% residual stenosis and a widely patent internal carotid artery.  I was very satisfied with these results.  The wire was then removed.  Active flow reversal was discontinued.  The flow reversal tubing was disconnected and the blood returned to the patient through the groin.  I then removed the TCAR sheath and secured closed the arteriotomy by tightening the 5-0 Prolene that was previously placed.  Hand-held Doppler revealed excellent signals in the common carotid artery proximal and distal to the cannulation site.  Next, the patient's heparin was reversed with 50 mg of protamine.  Hemostasis was achieved.  The wound was irrigated.  Surgiflo was placed over top of the artery.  The platysma muscle was closed with a running 3-0 Vicryl.  The skin was closed with a running 3-0 Vicryl followed by Dermabond.  The sheath in the right groin was removed and manual pressure was held for hemostasis.  The patient tolerated the procedure well.  She was successfully extubated.  She was neurologically intact and taken to recovery in stable condition.   Disposition: To PACU stable.   Juleen China, M.D., [facility] [place] Office: [phone] Pager:  [phone]

## 2023-04-18 NOTE — Progress Notes (Signed)
Status post left TCAR Patient now on 4 E.  Daughter at bedside Incision without hematoma Neurologically intact  [place] Andrea Hart

## 2023-04-18 NOTE — Discharge Instructions (Signed)
   [facility]  Discharge Instructions   Carotid Surgery  Please refer to the following instructions for your post-procedure care. Your surgeon or physician assistant will discuss any changes with you.  Activity  You are encouraged to walk as much as you can. You can slowly return to normal activities but must avoid strenuous activity and heavy lifting until your doctor tell you it's okay. Avoid activities such as vacuuming or swinging a golf club. You can drive after one week if you are comfortable and you are no longer taking prescription pain medications. It is normal to feel tired for serval weeks after your surgery. It is also normal to have difficulty with sleep habits, eating, and bowel movements after surgery. These will go away with time.  Bathing/Showering  Shower daily after you go home. Do not soak in a bathtub, hot tub, or swim until the incision heals completely.  Incision Care  Shower every day. Clean your incision with mild soap and water. Pat the area dry with a clean towel. You do not need a bandage unless otherwise instructed. Do not apply any ointments or creams to your incision. You may have skin glue on your incision. Do not peel it off. It will come off on its own in about one week. Your incision may feel thickened and raised for several weeks after your surgery. This is normal and the skin will soften over time.   For Men Only: It's okay to shave around the incision but do not shave the incision itself for 2 weeks. It is common to have numbness under your chin that could last for several months.  Diet  Resume your normal diet. There are no special food restrictions following this procedure. A low fat/low cholesterol diet is recommended for all patients with vascular disease. In order to heal from your surgery, it is CRITICAL to get adequate nutrition. Your body requires vitamins, minerals, and protein. Vegetables are the best source of  vitamins and minerals. Vegetables also provide the perfect balance of protein. Processed food has little nutritional value, so try to avoid this.  Medications  Resume taking all of your medications unless your doctor or physician assistant tells you not to. If your incision is causing pain, you may take over-the- counter pain relievers such as acetaminophen (Tylenol). If you were prescribed a stronger pain medication, please be aware these medications can cause nausea and constipation. Prevent nausea by taking the medication with a snack or meal. Avoid constipation by drinking plenty of fluids and eating foods with a high amount of fiber, such as fruits, vegetables, and grains.   Do not take Tylenol if you are taking prescription pain medications.  Follow Up  Our office will schedule a follow up appointment 2-3 weeks following discharge.  Please call us immediately for any of the following conditions  . Increased pain, redness, drainage (pus) from your incision site. . Fever of 101 degrees or higher. . If you should develop stroke (slurred speech, difficulty swallowing, weakness on one side of your body, loss of vision) you should call 911 and go to the nearest emergency room. .  Reduce your risk of vascular disease:  . Stop smoking. If you would like help call [facility] at 1-800-QUIT-NOW (1-800-784-8669) or [place] at 336-586-4000. . Manage your cholesterol . Maintain a desired weight . Control your diabetes . Keep your blood pressure down .  If you have any questions, please call the office at [phone]. 

## 2023-04-18 NOTE — Progress Notes (Signed)
Pt arrived from ...PACU.., A/ox ..4.pt denies any pain, MD aware,CCMD called. CHG bath given,no further needs at this time    

## 2023-04-18 NOTE — Transfer of Care (Signed)
Immediate Anesthesia Transfer of Care Note  Patient: Andrea Hart  Procedure(s) Performed: LEFT Transcarotid Artery Revascularization (Left) ULTRASOUND GUIDANCE FOR VASCULAR ACCESS (Groin)  Patient Location: PACU  Anesthesia Type:General  Level of Consciousness: awake, alert , and oriented  Airway & Oxygen Therapy: Patient Spontanous Breathing  Post-op Assessment: Report given to RN, Post -op Vital signs reviewed and stable, Patient moving all extremities, Patient moving all extremities X 4, and Patient able to stick tongue midline  Post vital signs: Reviewed and stable  Last Vitals:  Vitals Value Taken Time  BP 124/54 04/18/23 1330  Temp 36.6 C 04/18/23 1315  Pulse 65 04/18/23 1341  Resp 14 04/18/23 1341  SpO2 94 % 04/18/23 1341  Vitals shown include unvalidated device data.  Last Pain:  Vitals:   04/18/23 1315  TempSrc:   PainSc: 0-No pain         Complications: No notable events documented.

## 2023-04-18 NOTE — Anesthesia Procedure Notes (Signed)
Procedure Name: Intubation Date/Time: 04/18/2023 11:45 AM  Performed by: Darryl Nestle, CRNAPre-anesthesia Checklist: Patient identified, Emergency Drugs available, Suction available and Patient being monitored Patient Re-evaluated:Patient Re-evaluated prior to induction Oxygen Delivery Method: Circle system utilized Preoxygenation: Pre-oxygenation with 100% oxygen Induction Type: IV induction Ventilation: Mask ventilation without difficulty Laryngoscope Size: Mac and 3 Grade View: Grade I Tube type: Oral Number of attempts: 1 Airway Equipment and Method: Stylet and Oral airway Placement Confirmation: ETT inserted through vocal cords under direct vision, positive ETCO2 and breath sounds checked- equal and bilateral Secured at: 21 cm Tube secured with: Tape Dental Injury: Teeth and Oropharynx as per pre-operative assessment

## 2023-04-18 NOTE — Interval H&P Note (Signed)
History and Physical Interval Note:  04/18/2023 10:33 AM  Andrea Hart  has presented today for surgery, with the diagnosis of Bilateral carotid stenosis.  The various methods of treatment have been discussed with the patient and family. After consideration of risks, benefits and other options for treatment, the patient has consented to  Procedure(s): Transcarotid Artery Revascularization (Left) as a surgical intervention.  The patient's history has been reviewed, patient examined, no change in status, stable for surgery.  I have reviewed the patient's chart and labs.  Questions were answered to the patient's satisfaction.     Durene Cal

## 2023-04-18 NOTE — Anesthesia Procedure Notes (Addendum)
Arterial Line Insertion Start/End5/15/2024 11:00 AM, 04/18/2023 11:10 AM Performed by: Darryl Nestle, CRNA, CRNA  Patient location: Pre-op. Preanesthetic checklist: patient identified, IV checked, site marked, risks and benefits discussed, surgical consent, monitors and equipment checked, pre-op evaluation, timeout performed and anesthesia consent Lidocaine 1% used for infiltration Right, radial was placed Catheter size: 20 G Hand hygiene performed  and maximum sterile barriers used   Attempts: 2 Procedure performed using ultrasound guided technique. Ultrasound Notes:anatomy identified, needle tip was noted to be adjacent to the nerve/plexus identified and no ultrasound evidence of intravascular and/or intraneural injection Following insertion, dressing applied and Biopatch. Post procedure assessment: normal and unchanged  Patient tolerated the procedure well with no immediate complications.

## 2023-04-18 NOTE — Anesthesia Preprocedure Evaluation (Signed)
Anesthesia Evaluation  Patient identified by MRN, date of birth, ID band Patient awake    Reviewed: Allergy & Precautions, NPO status , Patient's Chart, lab work & pertinent test results  Airway Mallampati: II  TM Distance: >3 FB Neck ROM: Full    Dental no notable dental hx.    Pulmonary asthma    Pulmonary exam normal        Cardiovascular hypertension, + angina  + CAD and + DOE   Rhythm:Regular Rate:Normal  ECHO 07/23:  1. Left ventricular ejection fraction, by estimation, is 60 to 65%. The  left ventricle has normal function. The left ventricle has no regional  wall motion abnormalities. There is moderate left ventricular hypertrophy.  Left ventricular diastolic  parameters are consistent with Grade I diastolic dysfunction (impaired  relaxation).   2. Right ventricular systolic function is normal. The right ventricular  size is normal.   3. Left atrial size was mildly dilated.   4. The mitral valve is normal in structure. Mild mitral valve  regurgitation. No evidence of mitral stenosis.   5. The aortic valve is normal in structure. Aortic valve regurgitation is  not visualized. Aortic valve sclerosis/calcification is present, without  any evidence of aortic stenosis.   6. The inferior vena cava is normal in size with greater than 50%  respiratory variability, suggesting right atrial pressure of 3 mmHg.     Neuro/Psych   Anxiety     CVA    GI/Hepatic Neg liver ROS, hiatal hernia,GERD  ,,  Endo/Other  Hypothyroidism    Renal/GU   negative genitourinary   Musculoskeletal  (+) Arthritis , Osteoarthritis,    Abdominal Normal abdominal exam  (+)   Peds  Hematology  (+) Blood dyscrasia, anemia Lab Results      Component                Value               Date                      WBC                      7.4                 04/18/2023                HGB                      11.6 (L)            04/18/2023                 HCT                      35.5 (L)            04/18/2023                MCV                      91.3                04/18/2023                PLT                      220  04/18/2023             Lab Results      Component                Value               Date                      NA                       140                 07/17/2022                K                        4.3                 07/17/2022                CO2                      16 (L)              07/17/2022                GLUCOSE                  119 (H)             07/17/2022                BUN                      22                  07/17/2022                CREATININE               1.30 (H)            04/12/2023                CALCIUM                  9.9                 07/17/2022                EGFR                     40 (L)              07/17/2022                GFRNONAA                 42 (L)              06/09/2022              Anesthesia Other Findings   Reproductive/Obstetrics                             Anesthesia Physical Anesthesia Plan  ASA: 3  Anesthesia Plan: General   Post-op Pain Management:    Induction: Intravenous  PONV Risk Score and Plan: 3 and Ondansetron,  Dexamethasone and Treatment may vary due to age or medical condition  Airway Management Planned: Mask and Oral ETT  Additional Equipment: Arterial line  Intra-op Plan:   Post-operative Plan: Extubation in OR  Informed Consent: I have reviewed the patients History and Physical, chart, labs and discussed the procedure including the risks, benefits and alternatives for the proposed anesthesia with the patient or authorized representative who has indicated his/her understanding and acceptance.     Dental advisory given  Plan Discussed with: CRNA  Anesthesia Plan Comments:        Anesthesia Quick Evaluation

## 2023-04-19 ENCOUNTER — Encounter (HOSPITAL_COMMUNITY): Payer: Self-pay | Admitting: Surgery

## 2023-04-19 LAB — BASIC METABOLIC PANEL
Anion gap: 13 (ref 5–15)
BUN: 27 mg/dL — ABNORMAL HIGH (ref 8–23)
CO2: 17 mmol/L — ABNORMAL LOW (ref 22–32)
Calcium: 8.8 mg/dL — ABNORMAL LOW (ref 8.9–10.3)
Chloride: 106 mmol/L (ref 98–111)
Creatinine, Ser: 1.34 mg/dL — ABNORMAL HIGH (ref 0.44–1.00)
GFR, Estimated: 39 mL/min — ABNORMAL LOW (ref >60)
Glucose, Bld: 153 mg/dL — ABNORMAL HIGH (ref 70–99)
Potassium: 4.3 mmol/L (ref 3.5–5.1)
Sodium: 136 mmol/L (ref 135–145)

## 2023-04-19 LAB — CBC
HCT: 28 % — ABNORMAL LOW (ref 36.0–46.0)
Hemoglobin: 9.2 g/dL — ABNORMAL LOW (ref 12.0–15.0)
MCH: 30.3 pg (ref 26.0–34.0)
MCHC: 32.9 g/dL (ref 30.0–36.0)
MCV: 92.1 fL (ref 80.0–100.0)
Platelets: 189 10*3/uL (ref 150–400)
RBC: 3.04 MIL/uL — ABNORMAL LOW (ref 3.87–5.11)
RDW: 13 % (ref 11.5–15.5)
WBC: 13.7 10*3/uL — ABNORMAL HIGH (ref 4.0–10.5)
nRBC: 0 % (ref 0.0–0.2)

## 2023-04-19 LAB — LIPID PANEL
Cholesterol: 116 mg/dL (ref 0–200)
HDL: 54 mg/dL (ref >40)
LDL Cholesterol: 39 mg/dL (ref 0–99)
Total CHOL/HDL Ratio: 2.1 RATIO
Triglycerides: 116 mg/dL (ref <150)
VLDL: 23 mg/dL (ref 0–40)

## 2023-04-19 LAB — BPAM RBC: Unit Type and Rh: 6200

## 2023-04-19 LAB — TYPE AND SCREEN
ABO/RH(D): A POS
PT AG Type: NEGATIVE
Unit division: 0

## 2023-04-19 MED ORDER — TRAMADOL HCL 50 MG PO TABS: 50.0000 mg | ORAL_TABLET | Freq: Three times a day (TID) | ORAL | 0 refills | Status: DC | PRN

## 2023-04-19 NOTE — Discharge Summary (Signed)
Discharge Summary     Andrea Hart [birth year] 87 y.o. female  [id number]  Admission Date: 04/18/2023  Discharge Date: 04/19/2023  Physician: Nada Libman, MD  Admission Diagnosis: Asymptomatic carotid artery stenosis without infarction, left [I65.22]   HPI:   This is a 87 y.o. female  who is referred for evaluation of carotid stenosis.  Ultrasound from 04/10/2023 shows a 80-99% left-sided stenosis.  She has had a CT scan as well, showing high-grade left-sided stenosis.  She is asymptomatic.  Specifically, she denies numbness or weakness in either extremity.  She denies slurred speech.  She denies amaurosis fugax.     The patient suffers from coronary artery disease, status post PCI.  She has had 2 separate strokes.  She is medically managed for hypertension.  She takes a statin for hyperlipidemia she is a non-smoker.  She has a history of GI bleeding in 2022.  Hospital Course:  The patient was admitted to the hospital and taken to the operating room on 04/18/2023 and underwent left TCAR    Findings: Calcified irregular appearing carotid stenosis, greater than 90%, resolved after stenting   The pt tolerated the procedure well and was transported to the PACU in good condition.   By POD 1, the pt neuro status was in tact.  Pt was able to void, ambulate and swallow without difficulty. She did have acute blood loss anemia but was asymptomatic.     Recent Labs    04/18/23 1051 04/19/23 0437  NA 139 136  K 4.1 4.3  CL 106 106  CO2 19* 17*  GLUCOSE 125* 153*  BUN 24* 27*  CALCIUM 9.8 8.8*   Recent Labs    04/18/23 1051 04/19/23 0437  WBC 7.4 13.7*  HGB 11.6* 9.2*  HCT 35.5* 28.0*  PLT 220 189   Recent Labs    04/18/23 1051  INR 1.0     Discharge Instructions     Discharge patient   Complete by: As directed    Discharge once pt has walked, eaten, voided and been seen by Dr. Myra Gianotti   Discharge disposition: 01-Home or Self Care   Discharge patient date:  04/19/2023       Discharge Diagnosis:  Asymptomatic carotid artery stenosis without infarction, left [I65.22]  Secondary Diagnosis: Patient Active Problem List   Diagnosis Date Noted   Asymptomatic carotid artery stenosis without infarction, left 04/18/2023   Stress reaction 07/17/2022   Cervical paraspinal muscle spasm 06/19/2022   Effort angina 05/24/2022   DOE (dyspnea on exertion) 04/28/2022   Osteoarthritis of glenohumeral joint, left 02/21/2022   Knee pain 11/29/2021   Osteoarthritis of knee 11/29/2021   Prediabetes 07/21/2021   Unsteady gait when walking 08/13/2020   Vitamin D deficiency 08/13/2020   CAD (coronary artery disease) 12/29/2019   CKD (chronic kidney disease), stage III (HCC) 12/29/2019   H/O coronary angioplasty 07/29/2018   Osteoarthritis 07/29/2018   Gastroesophageal reflux disease without esophagitis 07/29/2018   Hypothyroidism 05/20/2018   Anemia 05/20/2018   Essential hypertension 05/20/2018   Mild intermittent asthma 05/20/2018   Hyperlipidemia 05/20/2018   Dry eye syndrome, bilateral 08/09/2017   Pseudophakia, both eyes 06/29/2017   PVD (posterior vitreous detachment), right 06/29/2017   Macular retinal edema 06/26/2017   Past Medical History:  Diagnosis Date   Abdominal hernia    Arthritis    Asthma    Carotid arterial disease (HCC)    a. 11/2020 Carotid U/S: Less than 50% RICA, 50-69% LICA.   CKD (chronic  kidney disease), stage III (HCC)    Coronary artery disease    a. 2013 s/p PCI and stent placement in [facility] done for stable angina.   GI bleeding 02/02/2021   History of blood transfusion    History of hiatal hernia    had  surgery   Hyperlipidemia    Hypertension    Hypothyroidism    Pneumonia    years ago   PVC's (premature ventricular contractions)    a. 01/2020 Zio: RSR, 70, frequent PVCs with 10.5% burden.   Stroke [facility])    a. 2017 - ILR did not show afib.    Allergies as of 04/19/2023   No Known  Allergies      Medication List     TAKE these medications    acetaminophen 500 MG tablet Commonly known as: TYLENOL Take 1,000 mg by mouth at bedtime.   albuterol 108 (90 Base) MCG/ACT inhaler Commonly known as: VENTOLIN HFA Inhale 2 puffs into the lungs every 4 (four) hours as needed for wheezing or shortness of breath.   amLODipine 10 MG tablet Commonly known as: NORVASC Take 1 tablet (10 mg total) by mouth daily.   aspirin EC 81 MG tablet Take 81 mg by mouth every evening.   budesonide-formoterol 160-4.5 MCG/ACT inhaler Commonly known as: SYMBICORT Inhale 2 puffs into the lungs 2 (two) times daily. What changed:  when to take this reasons to take this   cetirizine 10 MG tablet Commonly known as: ZYRTEC Take 1 tablet (10 mg total) by mouth daily. What changed: when to take this   clobetasol 0.05 % external solution Commonly known as: TEMOVATE Apply 1 Application topically in the morning and at bedtime.   clopidogrel 75 MG tablet Commonly known as: PLAVIX Take 1 tablet (75 mg total) by mouth daily.   fluticasone 50 MCG/ACT nasal spray Commonly known as: FLONASE Place 2 sprays into both nostrils daily. What changed:  when to take this reasons to take this   hydrochlorothiazide 25 MG tablet Commonly known as: HYDRODIURIL Take 1 tablet (25 mg total) by mouth daily.   ketoconazole 2 % shampoo Commonly known as: NIZORAL Apply topically.   levothyroxine 75 MCG tablet Commonly known as: SYNTHROID Take 1 tablet (75 mcg total) by mouth daily before breakfast.   meclizine 25 MG tablet Commonly known as: ANTIVERT Take 0.5 tablets (12.5 mg total) by mouth 3 (three) times daily as needed for dizziness or nausea.   methocarbamol 500 MG tablet Commonly known as: ROBAXIN Take 1 tablet (500 mg total) by mouth at bedtime as needed for muscle spasms.   montelukast 10 MG tablet Commonly known as: SINGULAIR Take 1 tablet (10 mg total) by mouth at bedtime.    multivitamin with minerals Tabs tablet Take 1 tablet by mouth daily with lunch. One A Day for Women   omeprazole 40 MG capsule Commonly known as: PRILOSEC Take 1 capsule (40 mg total) by mouth 2 (two) times daily.   rosuvastatin 20 MG tablet Commonly known as: CRESTOR Take 1 tablet (20 mg total) by mouth daily.   spironolactone 25 MG tablet Commonly known as: ALDACTONE Take 1 tablet (25 mg total) by mouth daily.   traMADol 50 MG tablet Commonly known as: Ultram Take 1 tablet (50 mg total) by mouth every 8 (eight) hours as needed.   triamcinolone cream 0.1 % Commonly known as: KENALOG Apply 1 Application topically 2 (two) times daily as needed (psoriasis (elbows)).   TURMERIC PO Take 1 capsule  by mouth daily with lunch. Qunol Joint Comfort With Turmeric Capsules   valsartan 320 MG tablet Commonly known as: DIOVAN TAKE 1 TABLET BY MOUTH EVERY DAY   Vitamin D3 125 MCG (5000 UT) Tabs Take by mouth.   zinc sulfate 220 (50 Zn) MG capsule Take 220 mg by mouth daily with lunch.         [facility] Discharge Instructions Carotid Endarterectomy (CEA)  Please refer to the following instructions for your post-procedure care. Your surgeon or physician assistant will discuss any changes with you.  Activity  You are encouraged to walk as much as you can. You can slowly return to normal activities but must avoid strenuous activity and heavy lifting until your doctor tell you it's OK. Avoid activities such as vacuuming or swinging a golf club. You can drive after one week if you are comfortable and you are no longer taking prescription pain medications. It is normal to feel tired for serval weeks after your surgery. It is also normal to have difficulty with sleep habits, eating, and bowel movements after surgery. These will go away with time.  Bathing/Showering  You may shower after you come home. Do not soak in a bathtub, hot tub, or swim until  the incision heals completely.  Incision Care  Shower every day. Clean your incision with mild soap and water. Pat the area dry with a clean towel. You do not need a bandage unless otherwise instructed. Do not apply any ointments or creams to your incision. You may have skin glue on your incision. Do not peel it off. It will come off on its own in about one week. Your incision may feel thickened and raised for several weeks after your surgery. This is normal and the skin will soften over time. For Men Only: It's OK to shave around the incision but do not shave the incision itself for 2 weeks. It is common to have numbness under your chin that could last for several months.  Diet  Resume your normal diet. There are no special food restrictions following this procedure. A low fat/low cholesterol diet is recommended for all patients with vascular disease. In order to heal from your surgery, it is CRITICAL to get adequate nutrition. Your body requires vitamins, minerals, and protein. Vegetables are the best source of vitamins and minerals. Vegetables also provide the perfect balance of protein. Processed food has little nutritional value, so try to avoid this.  Medications  Resume taking all of your medications unless your doctor or physician assistant tells you not to.  If your incision is causing pain, you may take over-the- counter pain relievers such as acetaminophen (Tylenol). If you were prescribed a stronger pain medication, please be aware these medications can cause nausea and constipation.  Prevent nausea by taking the medication with a snack or meal. Avoid constipation by drinking plenty of fluids and eating foods with a high amount of fiber, such as fruits, vegetables, and grains.  Do not take Tylenol if you are taking prescription pain medications.  Follow Up  Our office will schedule a follow up appointment 2-3 weeks following discharge.  Please call us immediately for any of the  following conditions  Increased pain, redness, drainage (pus) from your incision site. Fever of 101 degrees or higher. If you should develop stroke (slurred speech, difficulty swallowing, weakness on one side of your body, loss of vision) you should call 911 and go to the nearest emergency room.  Reduce your risk of vascular disease:  Stop smoking. If you would like help call [facility] at 1-800-QUIT-NOW (325-004-7277) or [place] at [phone]. Manage your cholesterol Maintain a desired weight Control your diabetes Keep your blood pressure down  If you have any questions, please call the office at [phone].  Prescriptions given: 1.   Tramadol #6 No Refill  Disposition: home  Patient's condition: is Good  Follow up: 1. [facility] in 4 weeks with carotid duplex   Doreatha Massed, PA-C [facility] [phone]   --- For [facility] Registry use ---   Modified Rankin score at D/C (0-6): 0  IV medication needed for:  1. Hypertension: No 2. Hypotension: No  Post-op Complications: No  1. Post-op CVA or TIA: No  If yes: Event classification (right eye, left eye, right cortical, left cortical, verterobasilar, other): n/a  If yes: Timing of event (intra-op, <6 hrs post-op, >=6 hrs post-op, unknown): n/a  2. CN injury: No  If yes: CN n/a injuried   3. Myocardial infarction: No  If yes: Dx by (EKG or clinical, Troponin): n/a  4.  CHF: No  5.  Dysrhythmia (new): No  6. Wound infection: No  7. Reperfusion symptoms: No  8. Return to OR: No  If yes: return to OR for (bleeding, neurologic, other CEA incision, other): n/a  Discharge medications: Statin use:  Yes ASA use:  Yes   Beta blocker use:  No ACE-Inhibitor use:  No  ARB use:  No CCB use: No P2Y12 Antagonist use: Yes, [ x] Plavix, [ ]  Plasugrel, [ ]  Ticlopinine, [ ]  Ticagrelor, [ ]  Other, [ ]  No for medical reason, [ ]  Non-compliant, [ ]  Not-indicated Anti-coagulant use:  No, [ ]  Warfarin,  [ ]  Rivaroxaban, [ ]  Dabigatran,

## 2023-04-19 NOTE — Anesthesia Postprocedure Evaluation (Signed)
Anesthesia Post Note  Patient: Andrea Hart  Procedure(s) Performed: LEFT Transcarotid Artery Revascularization (Left) ULTRASOUND GUIDANCE FOR VASCULAR ACCESS (Groin)     Patient location during evaluation: PACU Anesthesia Type: General Level of consciousness: awake and alert Pain management: pain level controlled Vital Signs Assessment: post-procedure vital signs reviewed and stable Respiratory status: spontaneous breathing, nonlabored ventilation, respiratory function stable and patient connected to nasal cannula oxygen Cardiovascular status: blood pressure returned to baseline and stable Postop Assessment: no apparent nausea or vomiting Anesthetic complications: no   No notable events documented.  Last Vitals:  Vitals:   04/19/23 0814 04/19/23 0820  BP: (!) 122/49   Pulse: (!) 57 83  Resp: 17 18  Temp: 36.5 C   SpO2: 96% 96%    Last Pain:  Vitals:   04/19/23 0814  TempSrc: Oral  PainSc: 0-No pain                 Earl Lites [address]

## 2023-04-19 NOTE — Progress Notes (Signed)
PHARMACIST LIPID MONITORING   Andrea Hart is a 87 y.o. female admitted on 04/18/2023 with carotid stenosis.  Pharmacy has been consulted to optimize lipid-lowering therapy with the indication of secondary prevention for clinical ASCVD.  Recent Labs:  Lipid Panel (last 6 months):   Lab Results  Component Value Date   CHOL 116 04/19/2023   TRIG 116 04/19/2023   HDL 54 04/19/2023   CHOLHDL 2.1 04/19/2023   VLDL 23 04/19/2023   LDLCALC 39 04/19/2023    Hepatic function panel (last 6 months):   Lab Results  Component Value Date   AST 23 04/18/2023   ALT 18 04/18/2023   ALKPHOS 62 04/18/2023   BILITOT 0.7 04/18/2023    SCr (since admission):   Serum creatinine: 1.34 mg/dL (H) 95/28/41 3244 Estimated creatinine clearance: 25.9 mL/min (A)  Current therapy and lipid therapy tolerance Current lipid-lowering therapy: rosuvastatin 20mg  qday Previous lipid-lowering therapies (if applicable):  Documented or reported allergies or intolerances to lipid-lowering therapies (if applicable):   Assessment:   Patient prefers no changes in lipid-lowering therapy at this time due to LDL<55  Plan:    1.Statin intensity (high intensity recommended for all patients regardless of the LDL):  No statin changes. The patient is already on a high intensity statin.  2.Add ezetimibe (if any one of the following):   Not indicated at this time.  3.Refer to lipid clinic:   No  4.Follow-up with:  Primary care provider - Erasmo Downer, MD  5.Follow-up labs after discharge:  No changes in lipid therapy, repeat a lipid panel in one year.       Ulyses Southward, PharmD, BCIDP, AAHIVP, CPP Infectious Disease Pharmacist 04/19/2023 7:10 AM

## 2023-04-19 NOTE — Progress Notes (Addendum)
  Progress Note    04/19/2023 6:41 AM 1 Day Post-Op  Subjective:  ready to go home; has voided; swallowing fine; walked yesterday.   Afebrile HR 50's-70's NSR 100's-140's systolic 92% RA  Gtts:  none  Vitals:   04/19/23 0356 04/19/23 0536  BP: (!) 109/42 (!) 115/48  Pulse: 65 60  Resp: 17 17  Temp: 98 F (36.7 C) 98.2 F (36.8 C)  SpO2: 92% 93%     Physical Exam: Neuro:  in tact; tongue is midline Lungs:  non labored Incision:  clean and dry; right groin soft without hematoma  CBC    Component Value Date/Time   WBC 13.7 (H) 04/19/2023 0437   RBC 3.04 (L) 04/19/2023 0437   HGB 9.2 (L) 04/19/2023 0437   HGB 11.7 07/17/2022 0903   HCT 28.0 (L) 04/19/2023 0437   HCT 35.7 07/17/2022 0903   PLT 189 04/19/2023 0437   PLT 314 07/17/2022 0903   MCV 92.1 04/19/2023 0437   MCV 91 07/17/2022 0903   MCH 30.3 04/19/2023 0437   MCHC 32.9 04/19/2023 0437   RDW 13.0 04/19/2023 0437   RDW 13.2 07/17/2022 0903   LYMPHSABS 1.0 07/17/2022 0903   MONOABS 0.6 02/02/2021 0308   EOSABS 0.2 07/17/2022 0903   BASOSABS 0.0 07/17/2022 0903    BMET    Component Value Date/Time   NA 136 04/19/2023 0437   NA 140 07/17/2022 0903   K 4.3 04/19/2023 0437   CL 106 04/19/2023 0437   CO2 17 (L) 04/19/2023 0437   GLUCOSE 153 (H) 04/19/2023 0437   BUN 27 (H) 04/19/2023 0437   BUN 22 07/17/2022 0903   CREATININE 1.34 (H) 04/19/2023 0437   CALCIUM 8.8 (L) 04/19/2023 0437   GFRNONAA 39 (L) 04/19/2023 0437   GFRAA 50 04/26/2021 0000     Intake/Output Summary (Last 24 hours) at 04/19/2023 0641 Last data filed at 04/19/2023 0025 Gross per 24 hour  Intake 1340 ml  Output 10 ml  Net 1330 ml     Assessment/Plan:  This is a 87 y.o. female who is s/p left TCAR  1 Day Post-Op  -pt is doing well this am. -pt neuro exam is in tact -creatinine 1.34 and appears to be at baseline -acute blood loss anemia-asymptomatic -leukocytosis of 13.7k up from 7.4-most likely related to periop-pt  afebrile -pt has ambulated -pt has voided -f/u with [facility] in 4 weeks with carotid duplex on Dr. Myra Gianotti clinic day.   Doreatha Massed, PA-C [facility] [phone]  I have evaluated the patient's chart/imaging and refer this patient to the Clinical Pharmacist Practitioner for medication management. I have reviewed the CPP's documentation and agree with her assessment and plan. I was immediately available during the visit for questions and collaboration.   Durene Cal, MD

## 2023-04-19 NOTE — Progress Notes (Signed)
MD to round before pt can be discharge per PA reported to pt's primary nurse.   Nahiara Kretzschmar,RN SWOT

## 2023-04-20 ENCOUNTER — Telehealth: Payer: Self-pay | Admitting: *Deleted

## 2023-04-20 NOTE — Transitions of Care (Post Inpatient/ED Visit) (Signed)
04/20/2023  Name: Andrea Hart MRN: [id number] DOB: Aug 19, 1936  Today's TOC FU Call Status: Today's TOC FU Call Status:: Successful TOC FU Call Competed TOC FU Call Complete Date: 04/20/23  Transition Care Management Follow-up Telephone Call Date of Discharge: 04/19/23 Discharge Facility: [facility]) Type of Discharge: Inpatient Admission Primary Inpatient Discharge Diagnosis:: Asymptomatic carotid artery stenosis without infarction, left How have you been since you were released from the hospital?: Better Any questions or concerns?: No  Items Reviewed: Did you receive and understand the discharge instructions provided?: No Medications obtained,verified, and reconciled?: Yes (Medications Reviewed) Any new allergies since your discharge?: No Dietary orders reviewed?: No Do you have support at home?: Yes People in Home: child(ren), adult Name of Support/Comfort Primary Source: Candace  Medications Reviewed Today: Medications Reviewed Today     Reviewed by Luella Cook, RN (Case Manager) on 04/20/23 at 1021  Med List Status: <None>   Medication Order Taking? Sig Documenting Provider Last Dose Status Informant  acetaminophen (TYLENOL) 500 MG tablet [id number] Yes Take 1,000 mg by mouth at bedtime. [provider] Taking Active Family Member  albuterol (VENTOLIN HFA) 108 (90 Base) MCG/ACT inhaler [id number] Yes Inhale 2 puffs into the lungs every 4 (four) hours as needed for wheezing or shortness of breath. Erasmo Downer, MD Taking Active Family Member  amLODipine (NORVASC) 10 MG tablet [id number] Yes Take 1 tablet (10 mg total) by mouth daily. Erasmo Downer, MD Taking Active Family Member  aspirin EC 81 MG tablet [id number] Yes Take 81 mg by mouth every evening. [provider] Taking Active Family Member  budesonide-formoterol [facility]) 160-4.5 MCG/ACT inhaler [id number] Yes Inhale 2 puffs into the lungs 2 (two) times daily.  Patient taking  differently: Inhale 2 puffs into the lungs 2 (two) times daily as needed (respiratory issues.).   Erasmo Downer, MD Taking Active Family Member  cetirizine (ZYRTEC) 10 MG tablet [id number] Yes Take 1 tablet (10 mg total) by mouth daily.  Patient taking differently: Take 10 mg by mouth at bedtime.   Erasmo Downer, MD Taking Active Family Member  Cholecalciferol (VITAMIN D3) 125 MCG (5000 UT) TABS [id number] Yes Take by mouth. [provider] Taking Active Family Member  clobetasol (TEMOVATE) 0.05 % external solution [id number] Yes Apply 1 Application topically in the morning and at bedtime. [provider] Taking Active Family Member  clopidogrel (PLAVIX) 75 MG tablet [id number] Yes Take 1 tablet (75 mg total) by mouth daily. Sondra Barges, PA-C Taking Active Family Member  fluticasone [facility]) 50 MCG/ACT nasal spray [id number] Yes Place 2 sprays into both nostrils daily.  Patient taking differently: Place 2 sprays into both nostrils daily as needed (allergies.).   Erasmo Downer, MD Taking Active Family Member  hydrochlorothiazide (HYDRODIURIL) 25 MG tablet [id number] Yes Take 1 tablet (25 mg total) by mouth daily. Sondra Barges, PA-C Taking Active Family Member  ketoconazole (NIZORAL) 2 % shampoo [id number]  Apply topically. [provider]  Active Family Member           Med Note Andrea Hart Apr 17, 2023  9:06 AM) Newly prescribed (patient has not used)  levothyroxine (SYNTHROID) 75 MCG tablet [id number] Yes Take 1 tablet (75 mcg total) by mouth daily before breakfast. Erasmo Downer, MD Taking Active Family Member  meclizine (ANTIVERT) 25 MG tablet [id number] Yes Take 0.5 tablets (12.5 mg total) by mouth 3 (three) times daily as needed for dizziness or nausea. Bacigalupo,  Marzella Schlein, MD Taking Active Family Member  methocarbamol (ROBAXIN) 500 MG tablet [id number] Yes Take 1 tablet (500 mg total) by mouth at bedtime as needed for muscle spasms.  Erasmo Downer, MD Taking Active Family Member  montelukast (SINGULAIR) 10 MG tablet [id number] Yes Take 1 tablet (10 mg total) by mouth at bedtime. Erasmo Downer, MD Taking Active Family Member  Multiple Vitamin (MULTIVITAMIN WITH MINERALS) TABS tablet [id number] Yes Take 1 tablet by mouth daily with lunch. One A Day for Women [provider] Taking Active Family Member  omeprazole (PRILOSEC) 40 MG capsule [id number] Yes Take 1 capsule (40 mg total) by mouth 2 (two) times daily. Erasmo Downer, MD Taking Active Family Member  rosuvastatin (CRESTOR) 20 MG tablet [id number] Yes Take 1 tablet (20 mg total) by mouth daily. Erasmo Downer, MD Taking Active Family Member  spironolactone (ALDACTONE) 25 MG tablet [id number] Yes Take 1 tablet (25 mg total) by mouth daily. Sondra Barges, PA-C Taking Active Family Member  traMADol Janean Sark) 50 MG tablet [id number] Yes Take 1 tablet (50 mg total) by mouth every 8 (eight) hours as needed. Dara Lords, PA-C Taking Active   triamcinolone cream (KENALOG) 0.1 % [id number] Yes Apply 1 Application topically 2 (two) times daily as needed (psoriasis (elbows)). [provider] Taking Active Family Member  TURMERIC PO [id number] Yes Take 1 capsule by mouth daily with lunch. Qunol Joint Comfort With Turmeric Capsules [provider] Taking Active Family Member  valsartan (DIOVAN) 320 MG tablet [id number] Yes TAKE 1 TABLET BY MOUTH EVERY DAY Dunn, Raymon Mutton, PA-C Taking Active Family Member  zinc sulfate 220 (50 Zn) MG capsule [id number] Yes Take 220 mg by mouth daily with lunch. [provider] Taking Active Family Member            Home Care and Equipment/Supplies: Were Home Health Services Ordered?: NA Any new equipment or medical supplies ordered?: NA  Functional Questionnaire: Do you need assistance with bathing/showering or dressing?: Yes Do you need assistance with meal preparation?: Yes Do you need  assistance with eating?: No Do you have difficulty maintaining continence: No Do you need assistance with getting out of bed/getting out of a chair/moving?: No Do you have difficulty managing or taking your medications?: No  Follow up appointments reviewed: PCP Follow-up appointment confirmed?: NA Specialist Hospital Follow-up appointment confirmed?: No Reason Specialist Follow-Up Not Confirmed: Patient has Specialist Provider Number and will Call for Appointment [facility] specialist will call with appt. Per daughter if they have not heard from them they will the office) Do you need transportation to your follow-up appointment?: No Do you understand care options if your condition(s) worsen?: Yes-patient verbalized understanding  SDOH Interventions Today    Flowsheet Row Most Recent Value  SDOH Interventions   Food Insecurity Interventions Intervention Not Indicated  Housing Interventions Intervention Not Indicated  Transportation Interventions Intervention Not Indicated      Interventions Today    Flowsheet Row Most Recent Value  General Interventions   General Interventions Discussed/Reviewed General Interventions Discussed, General Interventions Reviewed  Exercise Interventions   Exercise Discussed/Reviewed Exercise Discussed  [encouraged to walk as much as you can]  Pharmacy Interventions   Pharmacy Dicussed/Reviewed Pharmacy Topics Discussed, Pharmacy Topics Reviewed  [Patient hasn't needed the ultram. Pain has been controlled]      TOC Interventions Today    Flowsheet Row Most Recent Value  TOC Interventions   TOC Interventions Discussed/Reviewed TOC Interventions Discussed, TOC Interventions  Reviewed        Gean Maidens BSN RN [facility] [phone]

## 2023-04-22 LAB — TYPE AND SCREEN
Donor AG Type: NEGATIVE
Donor AG Type: NEGATIVE

## 2023-04-22 LAB — BPAM RBC
Blood Product Expiration Date: 202405302359
Blood Product Expiration Date: 202406052359

## 2023-05-18 ENCOUNTER — Other Ambulatory Visit: Payer: Self-pay | Admitting: *Deleted

## 2023-05-18 DIAGNOSIS — I6523 Occlusion and stenosis of bilateral carotid arteries: Secondary | ICD-10-CM

## 2023-05-28 ENCOUNTER — Ambulatory Visit (HOSPITAL_COMMUNITY)
Admission: RE | Admit: 2023-05-28 | Discharge: 2023-05-28 | Disposition: A | Discharge status: Home / Self Care | Payer: Medicare Other | Source: Ambulatory Visit | Attending: Surgery | Admitting: Surgery

## 2023-05-28 ENCOUNTER — Ambulatory Visit (INDEPENDENT_AMBULATORY_CARE_PROVIDER_SITE_OTHER): Payer: Medicare Other | Admitting: Physician Assistant

## 2023-05-28 VITALS — BP 146/81 | HR 77 | Temp 98.0°F | Resp 20 | Ht 60.0 in | Wt 140.0 lb

## 2023-05-28 DIAGNOSIS — I6523 Occlusion and stenosis of bilateral carotid arteries: Secondary | ICD-10-CM

## 2023-05-28 NOTE — Progress Notes (Signed)
POST OPERATIVE OFFICE NOTE    CC:  F/u for surgery  HPI:  Andrea Hart is a 87 y.o. female who is s/p left TCAR on 04/18/2023 by Dr.Brabham. This was done for asymptomatic critical ICA stenosis.  Pt returns today for follow up.  Pt states she has been doing well since surgery. She denies any strokelike symptoms such as sudden weakness/numbness, sudden visual changes, slurred speech, or facial droop.   She is tolerating her ASA and plavix.   No Known Allergies  Current Outpatient Medications  Medication Sig Dispense Refill   acetaminophen (TYLENOL) 500 MG tablet Take 1,000 mg by mouth at bedtime.     albuterol (VENTOLIN HFA) 108 (90 Base) MCG/ACT inhaler Inhale 2 puffs into the lungs every 4 (four) hours as needed for wheezing or shortness of breath. 6.7 g 11   amLODipine (NORVASC) 10 MG tablet Take 1 tablet (10 mg total) by mouth daily. 90 tablet 1   aspirin EC 81 MG tablet Take 81 mg by mouth every evening.     budesonide-formoterol (SYMBICORT) 160-4.5 MCG/ACT inhaler Inhale 2 puffs into the lungs 2 (two) times daily. (Patient taking differently: Inhale 2 puffs into the lungs 2 (two) times daily as needed (respiratory issues.).) 3 each 3   cetirizine (ZYRTEC) 10 MG tablet Take 1 tablet (10 mg total) by mouth daily. (Patient taking differently: Take 10 mg by mouth at bedtime.) 90 tablet 3   Cholecalciferol (VITAMIN D3) 125 MCG (5000 UT) TABS Take by mouth.     clobetasol (TEMOVATE) 0.05 % external solution Apply 1 Application topically in the morning and at bedtime.     clopidogrel (PLAVIX) 75 MG tablet Take 1 tablet (75 mg total) by mouth daily. 90 tablet 3   fluticasone (FLONASE) 50 MCG/ACT nasal spray Place 2 sprays into both nostrils daily. (Patient taking differently: Place 2 sprays into both nostrils daily as needed (allergies.).) 16 g 6   hydrochlorothiazide (HYDRODIURIL) 25 MG tablet Take 1 tablet (25 mg total) by mouth daily. 90 tablet 3   ketoconazole (NIZORAL) 2 % shampoo Apply  topically.     levothyroxine (SYNTHROID) 75 MCG tablet Take 1 tablet (75 mcg total) by mouth daily before breakfast. 90 tablet 1   meclizine (ANTIVERT) 25 MG tablet Take 0.5 tablets (12.5 mg total) by mouth 3 (three) times daily as needed for dizziness or nausea. 30 tablet 1   methocarbamol (ROBAXIN) 500 MG tablet Take 1 tablet (500 mg total) by mouth at bedtime as needed for muscle spasms. 90 tablet 1   montelukast (SINGULAIR) 10 MG tablet Take 1 tablet (10 mg total) by mouth at bedtime. 90 tablet 1   Multiple Vitamin (MULTIVITAMIN WITH MINERALS) TABS tablet Take 1 tablet by mouth daily with lunch. One A Day for Women     omeprazole (PRILOSEC) 40 MG capsule Take 1 capsule (40 mg total) by mouth 2 (two) times daily. 180 capsule 1   rosuvastatin (CRESTOR) 20 MG tablet Take 1 tablet (20 mg total) by mouth daily. 90 tablet 1   spironolactone (ALDACTONE) 25 MG tablet Take 1 tablet (25 mg total) by mouth daily. 90 tablet 3   traMADol (ULTRAM) 50 MG tablet Take 1 tablet (50 mg total) by mouth every 8 (eight) hours as needed. (Patient not taking: Reported on 05/28/2023) 6 tablet 0   triamcinolone cream (KENALOG) 0.1 % Apply 1 Application topically 2 (two) times daily as needed (psoriasis (elbows)).     TURMERIC PO Take 1 capsule by mouth daily with  lunch. Theodoro Grist Joint Comfort With Turmeric Capsules     valsartan (DIOVAN) 320 MG tablet TAKE 1 TABLET BY MOUTH EVERY DAY 90 tablet 0   zinc sulfate 220 (50 Zn) MG capsule Take 220 mg by mouth daily with lunch.     No current facility-administered medications for this visit.     ROS:  See HPI  Physical Exam:  Incision:  L sided neck incision well healed without hematoma or infection Extremities:  palpable and equal radial pulses Neuro: intact. Moving all extremities equally. No facial droop or slurred speech  Carotid Duplex (05/28/2023) Right Carotid Findings:  +----------+--------+--------+--------+------------------+--------+           PSV cm/sEDV  cm/sStenosisPlaque DescriptionComments  +----------+--------+--------+--------+------------------+--------+  CCA Prox  62      10              heterogenous                +----------+--------+--------+--------+------------------+--------+  CCA Distal54      14              heterogenous                +----------+--------+--------+--------+------------------+--------+  ICA Prox  65      17      1-39%   heterogenous                +----------+--------+--------+--------+------------------+--------+  ICA Mid   84      25                                          +----------+--------+--------+--------+------------------+--------+  ICA Distal100     32                                          +----------+--------+--------+--------+------------------+--------+  ECA      75      10                                          +----------+--------+--------+--------+------------------+--------+   +----------+--------+-------+--------+-------------------+           PSV cm/sEDV cmsDescribeArm Pressure (mmHG)  +----------+--------+-------+--------+-------------------+  [id number]                   135                  +----------+--------+-------+--------+-------------------+   +---------+--------+--+--------+--+---------+  VertebralPSV cm/s41EDV cm/s14Antegrade  +---------+--------+--+--------+--+---------+      Left Carotid Findings:  +----------+--------+--------+--------+------------------+--------+           PSV cm/sEDV cm/sStenosisPlaque DescriptionComments  +----------+--------+--------+--------+------------------+--------+  CCA Prox  75      19              heterogenous                +----------+--------+--------+--------+------------------+--------+  CCA Distal32      12              heterogenous                +----------+--------+--------+--------+------------------+--------+  ICA Distal64      18                                           +----------+--------+--------+--------+------------------+--------+  ECA      265             >50%                                +----------+--------+--------+--------+------------------+--------+   Patent left carotid stent   Assessment/Plan:  This is a 87 y.o. female who is s/p: L TCAR on 5/15  -She has been doing well since surgery. She has had no pain from her incision site -She denies any stroke like symptoms. On exam she is neuro intact with no facial droop, slurred speech, hoarseness, or lip droop -Duplex demonstrates a patent left carotid stent without stenosis. Her right ICA stenosis is 1-39% -Her incision is well healed without signs of infection  -She can follow up with our office in 9 months with bilateral carotid duplex   Loel Dubonnet, PA-C [facility] [phone]   Clinic MD:  Myra Gianotti

## 2023-06-04 ENCOUNTER — Other Ambulatory Visit: Payer: Self-pay

## 2023-06-04 DIAGNOSIS — I6523 Occlusion and stenosis of bilateral carotid arteries: Secondary | ICD-10-CM

## 2023-06-14 ENCOUNTER — Encounter: Payer: Self-pay | Admitting: Family Medicine

## 2023-06-14 ENCOUNTER — Ambulatory Visit (INDEPENDENT_AMBULATORY_CARE_PROVIDER_SITE_OTHER): Payer: Medicare Other | Admitting: Family Medicine

## 2023-06-14 VITALS — BP 118/78 | HR 71 | Temp 97.7°F | Resp 16 | Ht 60.0 in | Wt 140.7 lb

## 2023-06-14 DIAGNOSIS — E785 Hyperlipidemia, unspecified: Secondary | ICD-10-CM | POA: Diagnosis not present

## 2023-06-14 DIAGNOSIS — N1832 Chronic kidney disease, stage 3b: Secondary | ICD-10-CM

## 2023-06-14 DIAGNOSIS — R040 Epistaxis: Secondary | ICD-10-CM | POA: Diagnosis not present

## 2023-06-14 DIAGNOSIS — R7303 Prediabetes: Secondary | ICD-10-CM

## 2023-06-14 DIAGNOSIS — K219 Gastro-esophageal reflux disease without esophagitis: Secondary | ICD-10-CM

## 2023-06-14 DIAGNOSIS — R0609 Other forms of dyspnea: Secondary | ICD-10-CM

## 2023-06-14 DIAGNOSIS — Z0000 Encounter for general adult medical examination without abnormal findings: Secondary | ICD-10-CM | POA: Diagnosis not present

## 2023-06-14 DIAGNOSIS — I10 Essential (primary) hypertension: Secondary | ICD-10-CM | POA: Diagnosis not present

## 2023-06-14 DIAGNOSIS — E039 Hypothyroidism, unspecified: Secondary | ICD-10-CM | POA: Diagnosis not present

## 2023-06-14 DIAGNOSIS — J4520 Mild intermittent asthma, uncomplicated: Secondary | ICD-10-CM

## 2023-06-14 DIAGNOSIS — D508 Other iron deficiency anemias: Secondary | ICD-10-CM

## 2023-06-14 DIAGNOSIS — E782 Mixed hyperlipidemia: Secondary | ICD-10-CM

## 2023-06-14 DIAGNOSIS — M62838 Other muscle spasm: Secondary | ICD-10-CM

## 2023-06-14 DIAGNOSIS — M19012 Primary osteoarthritis, left shoulder: Secondary | ICD-10-CM

## 2023-06-14 MED ORDER — MONTELUKAST SODIUM 10 MG PO TABS: 10.0000 mg | ORAL_TABLET | Freq: Every day | ORAL | 1 refills | Status: DC

## 2023-06-14 MED ORDER — OMEPRAZOLE 40 MG PO CPDR: 40.0000 mg | DELAYED_RELEASE_CAPSULE | Freq: Two times a day (BID) | ORAL | 1 refills | Status: DC

## 2023-06-14 MED ORDER — AMLODIPINE BESYLATE 10 MG PO TABS: 10.0000 mg | ORAL_TABLET | Freq: Every day | ORAL | 1 refills | Status: DC

## 2023-06-14 MED ORDER — ROSUVASTATIN CALCIUM 20 MG PO TABS: 20.0000 mg | ORAL_TABLET | Freq: Every day | ORAL | 1 refills | Status: DC

## 2023-06-14 MED ORDER — LEVOTHYROXINE SODIUM 75 MCG PO TABS
75.0000 ug | ORAL_TABLET | Freq: Every day | ORAL | 1 refills | Status: DC
Start: 2023-06-14 — End: 2023-09-27

## 2023-06-14 MED ORDER — METHOCARBAMOL 500 MG PO TABS
500.0000 mg | ORAL_TABLET | Freq: Every evening | ORAL | 1 refills | Status: DC | PRN
Start: 2023-06-14 — End: 2024-06-16

## 2023-06-14 MED ORDER — BUDESONIDE-FORMOTEROL FUMARATE 160-4.5 MCG/ACT IN AERO: 2.0000 | INHALATION_SPRAY | Freq: Two times a day (BID) | RESPIRATORY_TRACT | 3 refills | Status: DC

## 2023-06-14 MED ORDER — AIRSUPRA 90-80 MCG/ACT IN AERO: 2.0000 | INHALATION_SPRAY | Freq: Four times a day (QID) | RESPIRATORY_TRACT | 2 refills | Status: DC | PRN
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
## 2023-06-15 DIAGNOSIS — I10 Essential (primary) hypertension: Secondary | ICD-10-CM | POA: Diagnosis not present

## 2023-06-15 DIAGNOSIS — E039 Hypothyroidism, unspecified: Secondary | ICD-10-CM | POA: Diagnosis not present

## 2023-06-15 DIAGNOSIS — R7303 Prediabetes: Secondary | ICD-10-CM | POA: Diagnosis not present

## 2023-06-15 DIAGNOSIS — E782 Mixed hyperlipidemia: Secondary | ICD-10-CM | POA: Diagnosis not present

## 2023-06-15 DIAGNOSIS — D508 Other iron deficiency anemias: Secondary | ICD-10-CM | POA: Diagnosis not present

## 2023-06-15 DIAGNOSIS — N1832 Chronic kidney disease, stage 3b: Secondary | ICD-10-CM | POA: Diagnosis not present

## 2023-06-15 NOTE — Assessment & Plan Note (Signed)
Recommend low carb diet °Recheck A1c  °

## 2023-06-15 NOTE — Assessment & Plan Note (Signed)
Increased use of albuterol inhaler due to wheezing and shortness of breath. Patient not regularly using Symbicort. -Resume Symbicort 2 puffs twice daily as a controller inhaler. -Prescribe Air Supra as a rescue inhaler, pending insurance approval. -If Air Supra is not approved, continue albuterol as needed.

## 2023-06-15 NOTE — Assessment & Plan Note (Signed)
F/b Nephrology °Chronic and stable °Recheck metabolic panel °Avoid nephrotoxic meds  °

## 2023-06-15 NOTE — Assessment & Plan Note (Signed)
Chronic and stable Continue PPI 

## 2023-06-15 NOTE — Assessment & Plan Note (Signed)
Chronic IDA Stopped iron supplement Recheck CBC and iron panel 

## 2023-06-15 NOTE — Assessment & Plan Note (Signed)
Previously well controlled Continue statin Repeat FLP and CMP Goal LDL < 70 

## 2023-06-15 NOTE — Assessment & Plan Note (Signed)
Previously well controlled Continue Synthroid at current dose  Recheck TSH and adjust Synthroid as indicated   

## 2023-06-15 NOTE — Assessment & Plan Note (Signed)
Well controlled Continue current medications Recheck metabolic panel F/u in 6 months  

## 2023-06-16 LAB — PHOSPHORUS: Phosphorus: 4.2 mg/dL (ref 3.0–4.3)

## 2023-06-16 LAB — CBC WITH DIFFERENTIAL/PLATELET
Basophils Absolute: 0.1 10*3/uL (ref 0.0–0.2)
Basos: 1 %
EOS (ABSOLUTE): 0.5 10*3/uL — ABNORMAL HIGH (ref 0.0–0.4)
Eos: 7 %
Hematocrit: 30.3 % — ABNORMAL LOW (ref 34.0–46.6)
Hemoglobin: 10.2 g/dL — ABNORMAL LOW (ref 11.1–15.9)
Immature Grans (Abs): 0.1 10*3/uL (ref 0.0–0.1)
Immature Granulocytes: 1 %
Lymphocytes Absolute: 1.3 10*3/uL (ref 0.7–3.1)
Lymphs: 20 %
MCH: 30.4 pg (ref 26.6–33.0)
MCHC: 33.7 g/dL (ref 31.5–35.7)
MCV: 90 fL (ref 79–97)
Monocytes Absolute: 0.6 10*3/uL (ref 0.1–0.9)
Monocytes: 8 %
Neutrophils Absolute: 4.3 10*3/uL (ref 1.4–7.0)
Neutrophils: 63 %
Platelets: 259 10*3/uL (ref 150–450)
RBC: 3.35 x10E6/uL — ABNORMAL LOW (ref 3.77–5.28)
RDW: 12.4 % (ref 11.7–15.4)
WBC: 6.8 10*3/uL (ref 3.4–10.8)

## 2023-06-16 LAB — COMPREHENSIVE METABOLIC PANEL
ALT: 17 IU/L (ref 0–32)
AST: 22 IU/L (ref 0–40)
Albumin: 4.4 g/dL (ref 3.7–4.7)
Alkaline Phosphatase: 70 IU/L (ref 44–121)
BUN/Creatinine Ratio: 23 (ref 12–28)
BUN: 30 mg/dL — ABNORMAL HIGH (ref 8–27)
Bilirubin Total: 0.2 mg/dL (ref 0.0–1.2)
CO2: 16 mmol/L — ABNORMAL LOW (ref 20–29)
Calcium: 9.8 mg/dL (ref 8.7–10.3)
Chloride: 110 mmol/L — ABNORMAL HIGH (ref 96–106)
Creatinine, Ser: 1.28 mg/dL — ABNORMAL HIGH (ref 0.57–1.00)
Globulin, Total: 2.5 g/dL (ref 1.5–4.5)
Glucose: 115 mg/dL — ABNORMAL HIGH (ref 70–99)
Potassium: 4.7 mmol/L (ref 3.5–5.2)
Sodium: 141 mmol/L (ref 134–144)
Total Protein: 6.9 g/dL (ref 6.0–8.5)
eGFR: 41 mL/min/{1.73_m2} — ABNORMAL LOW (ref >59)

## 2023-06-16 LAB — IRON,TIBC AND FERRITIN PANEL
Ferritin: 61 ng/mL (ref 15–150)
Iron Saturation: 22 % (ref 15–55)
Iron: 76 ug/dL (ref 27–139)
Total Iron Binding Capacity: 353 ug/dL (ref 250–450)
UIBC: 277 ug/dL (ref 118–369)

## 2023-06-16 LAB — HEMOGLOBIN A1C
Est. average glucose Bld gHb Est-mCnc: 128 mg/dL
Hgb A1c MFr Bld: 6.1 % — ABNORMAL HIGH (ref 4.8–5.6)

## 2023-06-16 LAB — PARATHYROID HORMONE, INTACT (NO CA): PTH: 19 pg/mL (ref 15–65)

## 2023-06-16 LAB — TSH: TSH: 3.51 u[IU]/mL (ref 0.450–4.500)

## 2023-06-18 DIAGNOSIS — R809 Proteinuria, unspecified: Secondary | ICD-10-CM | POA: Diagnosis not present

## 2023-06-18 DIAGNOSIS — I10 Essential (primary) hypertension: Secondary | ICD-10-CM | POA: Diagnosis not present

## 2023-06-18 DIAGNOSIS — I129 Hypertensive chronic kidney disease with stage 1 through stage 4 chronic kidney disease, or unspecified chronic kidney disease: Secondary | ICD-10-CM | POA: Diagnosis not present

## 2023-06-18 DIAGNOSIS — N1832 Chronic kidney disease, stage 3b: Secondary | ICD-10-CM | POA: Diagnosis not present

## 2023-06-18 DIAGNOSIS — E8722 Chronic metabolic acidosis: Secondary | ICD-10-CM | POA: Diagnosis not present

## 2023-06-19 NOTE — Progress Notes (Signed)
Cardiology Office Note    Date:  06/22/2023   ID:  Andrea Hart, DOB [birth year], MRN [id number]  PCP:  Erasmo Downer, MD  Cardiologist:  Lorine Bears, MD  Electrophysiologist:  None   Chief Complaint: Follow up   History of Present Illness:   Andrea Hart is a 87 y.o. female with history of CAD status post stenting in 2013 to the RCA status post PCI/DES to the RCA overlapping the previously placed stent on 05/24/2022, CKD stage III, prior CVA x2, HTN, HLD, carotid artery disease status post left TCAR in 04/2023, GI bleeding in 02/2021, PVCs, hypothyroidism, and asthma who presents for follow-up of CAD.   She underwent stenting of an unknown vessel in [place], [place] in 2013.  In 2017, she suffered a stroke.  ILR did not show any evidence of A-fib, and she has been maintained on aspirin and clopidogrel since.  In 01/2016, she complained of presyncope and underwent event monitoring.  This showed frequent PVCs with a 10% burden.  She reported a long history of PVCs and has been managed with beta-blocker therapy.  She has history of difficult to control hypertension with renal artery ultrasound in 07/2020 showing no evidence of RAS.  Carotid artery ultrasound in 11/2020 showed moderate, nonobstructive bilateral left greater than right ICA disease.  She was admitted in 02/2021 with rectal bleeding with GI feeling the presentation was most consistent with an acute diverticular bleed.  Aspirin and clopidogrel were held and she underwent outpatient colonoscopy in 04/2021 which demonstrated nonbleeding internal hemorrhoids, moderate diverticulosis without evidence of diverticular bleeding.  She was subsequently resumed on aspirin and clopidogrel.  She was seen in the office in 02/2022 and was without symptoms of angina or decompensation.  She remained active with multiple social events.  Carotid artery ultrasound from 02/2022 demonstrated 1 to 39% RICA stenosis and 60 to 79% LICA stenosis (previously 50 to  69%).  She was seen on 05/16/2022 noting a 9 to 43-month history of exertional dyspnea that had become more pronounced.  Symptoms felt similar, though not as severe, to her prior angina leading up to PCI in 2013.  She was without frank chest pain.  Given concerning symptoms, she underwent LHC on 05/24/2022 which showed moderately to severely calcified coronary arteries with significant one-vessel CAD.  There was 95% stenosis in the proximal/mid RCA just before the previously placed stent.  LVEDP was upper limit of normal.  She underwent successful PCI/DES to the RCA overlapping with the previously placed stent.  Post-cath labs notable for a hemoglobin of 9.4.  Carvedilol was decreased secondary to bradycardia.     She was seen in the office on 05/30/2022 and noted improvement in her dyspnea following PCI to the RCA.  She was frustrated as she continued to be fatigued, though this is subsequently improved with cardiac rehab.  Bradycardia improved following the tapering of carvedilol.  Echo on 06/22/2022 demonstrated an EF of 60 to 65%, no regional wall motion abnormalities, moderate LVH, grade 1 diastolic dysfunction, normal RV systolic function and ventricular cavity size, mildly dilated left atrium, mild mitral regurgitation, aortic valve sclerosis without evidence of stenosis, and an estimated right atrial pressure of 3 mmHg.     She was subsequently rechallenged on carvedilol due to elevated BPs, though again reported intolerance leading to its discontinuation.  However, more recently carvedilol was again restarted in 02/2023 due to continued elevated BP readings.  She dropped off BP readings on 04/10/2023, with readings typically  in the 130s to 150s systolic with an occasional reading in the 170s with rare readings in the 180s to 190s over 70s to 90s with heart rates largely in the 60s to 70s bpm with occasional reading in the upper 50s bpm.  At that time, she reported taking carvedilol 3.125 mg twice daily,  amlodipine 10 mg daily, valsartan 320 mg daily, and spironolactone 12.5 mg daily.  It was recommended she titrate spironolactone to 25 mg daily.   Carotid artery ultrasound from 04/10/2023 showed showed 80 to 99% left ICA stenosis with 1 to 39% right ICA stenosis.  This was a progression of the left ICA stenosis that was previously noted to be 60 to 79% in 02/2022.  Subsequent CTA of the head/neck on 04/12/2023 demonstrated 75% stenosis of the proximal left ICA just beyond the bifurcation as well as atherosclerotic calcification at the right carotid bifurcation and internal carotid arteries without significant stenosis.  She was evaluated by vascular surgery and underwent left TCAR on 04/18/2023 for asymptomatic critical ICA stenosis.  Follow-up carotid artery ultrasound on 05/28/2023 showed 1 to 39% right ICA stenosis with patent left ICA stenosis without obvious evidence of stenosis.  She comes in today noting an increase in exertional fatigue and dyspnea that she feels like became more noticeable following TCAR. No frank chest pain. Symptoms feel similar to what she was experiencing in 2023 leading up to her PCI. No presyncope or syncope. She prefers to avoid cardiac cath. No falls or symptoms concerning for bleeding. Home BP readings ranging from 131-141/67-75.   Labs independently reviewed: 06/2023 - Hgb 10.2, PLT 259, BUN 30, serum creatinine 1.28, potassium 4.7, albumin 4.4, AST/ALT normal, A1c 6.1, TSH normal 04/2023 - TC 160, TG 116, HDL 54, LDL 39  Past Medical History:  Diagnosis Date   Abdominal hernia    Arthritis    Asthma    Carotid arterial disease (HCC)    a. 11/2020 Carotid U/S: Less than 50% RICA, 50-69% LICA.   CKD (chronic kidney disease), stage III (HCC)    Coronary artery disease    a. 2013 s/p PCI and stent placement in [facility] done for stable angina.   GI bleeding 02/02/2021   History of blood transfusion    History of hiatal hernia    had  surgery    Hyperlipidemia    Hypertension    Hypothyroidism    Pneumonia    years ago   PVC's (premature ventricular contractions)    a. 01/2020 Zio: RSR, 70, frequent PVCs with 10.5% burden.   Stroke [facility])    a. 2017 - ILR did not show afib.    Past Surgical History:  Procedure Laterality Date   ABDOMINAL HYSTERECTOMY     APPENDECTOMY     CATARACT EXTRACTION Bilateral    CHOLECYSTECTOMY     COLONOSCOPY WITH PROPOFOL N/A 04/06/2021   Procedure: COLONOSCOPY WITH PROPOFOL;  Surgeon: Toledo, Boykin Nearing, MD;  Location: [facility] ENDOSCOPY;  Service: Gastroenterology;  Laterality: N/A;   CORONARY ANGIOPLASTY  2013   1xStent [facility] [place].   CORONARY STENT INTERVENTION N/A 05/24/2022   Procedure: CORONARY STENT INTERVENTION;  Surgeon: Iran Ouch, MD;  Location: [facility] INVASIVE CV LAB;  Service: Cardiovascular;  Laterality: N/A;   EYE SURGERY     HAND SURGERY Right    LEFT HEART CATH AND CORONARY ANGIOGRAPHY Left 05/24/2022   Procedure: LEFT HEART CATH AND CORONARY ANGIOGRAPHY;  Surgeon: Iran Ouch, MD;  Location: [facility] INVASIVE CV LAB;  Service: Cardiovascular;  Laterality: Left;   LOOP RECORDER IMPLANT     NISSEN FUNDOPLICATION  2016   REPLACEMENT TOTAL KNEE Bilateral 2017   TOTAL HIP ARTHROPLASTY Right    TOTAL VAGINAL HYSTERECTOMY     TRANSCAROTID ARTERY REVASCULARIZATION  Left 04/18/2023   Procedure: LEFT Transcarotid Artery Revascularization;  Surgeon: Nada Libman, MD;  Location: [facility] OR;  Service: Vascular;  Laterality: Left;   ULTRASOUND GUIDANCE FOR VASCULAR ACCESS  04/18/2023   Procedure: ULTRASOUND GUIDANCE FOR VASCULAR ACCESS;  Surgeon: Nada Libman, MD;  Location: [facility] OR;  Service: Vascular;;    Current Medications: Current Meds  Medication Sig   acetaminophen (TYLENOL) 500 MG tablet Take 1,000 mg by mouth at bedtime.   albuterol (VENTOLIN HFA) 108 (90 Base) MCG/ACT inhaler Inhale 2 puffs into the lungs every 4 (four) hours as needed for wheezing or shortness of breath.    amLODipine (NORVASC) 10 MG tablet Take 1 tablet (10 mg total) by mouth daily.   aspirin EC 81 MG tablet Take 81 mg by mouth every evening.   budesonide-formoterol (SYMBICORT) 160-4.5 MCG/ACT inhaler Inhale 2 puffs into the lungs 2 (two) times daily.   cetirizine (ZYRTEC) 10 MG tablet Take 1 tablet (10 mg total) by mouth daily. (Patient taking differently: Take 10 mg by mouth at bedtime.)   Cholecalciferol (VITAMIN D3) 125 MCG (5000 UT) TABS Take by mouth.   clobetasol (TEMOVATE) 0.05 % external solution Apply 1 Application topically in the morning and at bedtime.   clopidogrel (PLAVIX) 75 MG tablet Take 1 tablet (75 mg total) by mouth daily.   fluticasone (FLONASE) 50 MCG/ACT nasal spray Place 2 sprays into both nostrils daily. (Patient taking differently: Place 2 sprays into both nostrils daily as needed (allergies.).)   ketoconazole (NIZORAL) 2 % shampoo Apply topically.   levothyroxine (SYNTHROID) 75 MCG tablet Take 1 tablet (75 mcg total) by mouth daily before breakfast.   meclizine (ANTIVERT) 25 MG tablet Take 0.5 tablets (12.5 mg total) by mouth 3 (three) times daily as needed for dizziness or nausea.   methocarbamol (ROBAXIN) 500 MG tablet Take 1 tablet (500 mg total) by mouth at bedtime as needed for muscle spasms.   montelukast (SINGULAIR) 10 MG tablet Take 1 tablet (10 mg total) by mouth at bedtime.   Multiple Vitamin (MULTIVITAMIN WITH MINERALS) TABS tablet Take 1 tablet by mouth daily with lunch. One A Day for Women   omeprazole (PRILOSEC) 40 MG capsule Take 1 capsule (40 mg total) by mouth 2 (two) times daily.   rosuvastatin (CRESTOR) 20 MG tablet Take 1 tablet (20 mg total) by mouth daily.   sodium bicarbonate 650 MG tablet Take 1,300 mg by mouth 2 (two) times daily.   spironolactone (ALDACTONE) 25 MG tablet Take 1 tablet (25 mg total) by mouth daily.   triamcinolone cream (KENALOG) 0.1 % Apply 1 Application topically 2 (two) times daily as needed (psoriasis (elbows)).   TURMERIC  PO Take 1 capsule by mouth daily with lunch. Qunol Joint Comfort With Turmeric Capsules   valsartan (DIOVAN) 320 MG tablet TAKE 1 TABLET BY MOUTH EVERY DAY   zinc sulfate 220 (50 Zn) MG capsule Take 220 mg by mouth daily with lunch.    Allergies:   Patient has no known allergies.   Social History   Socioeconomic History   Marital status: Widowed    Spouse name: Not on file   Number of children: 2   Years of education: Not on file   Highest education  level: Some college, no degree  Occupational History    Comment: retired Photographer  Tobacco Use   Smoking status: Never   Smokeless tobacco: Never  Vaping Use   Vaping status: Never Used  Substance and Sexual Activity   Alcohol use: Not Currently    Comment: Occasional   Drug use: Never   Sexual activity: Never    Birth control/protection: Surgical  Other Topics Concern   Not on file  Social History Narrative   Moved here in 2019, had been living in [place].    Born here, raised daughters here.    Lives with daughter now, [place].   Lost one daughter in car wreck.    Social Determinants of Health   Financial Resource Strain: Low Risk  (11/29/2021)   Overall Financial Resource Strain (CARDIA)    Difficulty of Paying Living Expenses: Not hard at all  Food Insecurity: No Food Insecurity (04/20/2023)   Hunger Vital Sign    Worried About Running Out of Food in the Last Year: Never true    Ran Out of Food in the Last Year: Never true  Transportation Needs: No Transportation Needs (04/20/2023)   PRAPARE - Administrator, Civil Service (Medical): No    Lack of Transportation (Non-Medical): No  Physical Activity: Insufficiently Active (11/29/2021)   Exercise Vital Sign    Days of Exercise per Week: 2 days    Minutes of Exercise per Session: 20 min  Stress: No Stress Concern Present (11/29/2021)   [employer] of Occupational Health - Occupational Stress Questionnaire    Feeling of Stress : Not at all  Social  Connections: Moderately Integrated (11/29/2021)   Social Connection and Isolation Panel [NHANES]    Frequency of Communication with Friends and Family: Three times a week    Frequency of Social Gatherings with Friends and Family: Twice a week    Attends Religious Services: More than 4 times per year    Active Member of [employer] or Organizations: Yes    Attends Banker Meetings: More than 4 times per year    Marital Status: Widowed     Family History:  The patient's family history includes Heart Problems in her brother, father, and mother; Heart attack in her brother and mother. There is no history of Colon cancer or Breast cancer.  ROS:   12-point review of systems is negative unless otherwise noted in the HPI.   EKGs/Labs/Other Studies Reviewed:    Studies reviewed were summarized above. The additional studies were reviewed today:  Carotid artery ultrasound 05/28/2023: Summary:  Right Carotid: Velocities in the right ICA are consistent with a 1-39% stenosis.   Left Carotid: The ECA appears >50% stenosed. Patent stent without obvious evidence of stenosis.   Vertebrals: Bilateral vertebral arteries demonstrate antegrade flow.  __________  Carotid artery ultrasound 04/10/2023: Summary:  Right Carotid: Velocities in the right ICA are consistent with a 1-39% stenosis.   Left Carotid: Velocities in the left ICA are consistent with a 80-99% stenosis. The ECA appears >50% stenosed.  __________   2D echo 06/22/2022: 1. Left ventricular ejection fraction, by estimation, is 60 to 65%. The  left ventricle has normal function. The left ventricle has no regional  wall motion abnormalities. There is moderate left ventricular hypertrophy.  Left ventricular diastolic  parameters are consistent with Grade I diastolic dysfunction (impaired  relaxation).   2. Right ventricular systolic function is normal. The right ventricular  size is normal.   3. Left atrial  size was mildly  dilated.   4. The mitral valve is normal in structure. Mild mitral valve  regurgitation. No evidence of mitral stenosis.   5. The aortic valve is normal in structure. Aortic valve regurgitation is  not visualized. Aortic valve sclerosis/calcification is present, without  any evidence of aortic stenosis.   6. The inferior vena cava is normal in size with greater than 50%  respiratory variability, suggesting right atrial pressure of 3 mmHg. __________   LHC 05/24/2022:   Mid RCA lesion is 10% stenosed.   Prox RCA to Mid RCA lesion is 95% stenosed.   Prox LAD to Mid LAD lesion is 20% stenosed.   A drug-eluting stent was successfully placed using a STENT ONYX FRONTIER [id number].   Post intervention, there is a 0% residual stenosis.   1.  Moderately to severely calcified coronary arteries with significant one-vessel coronary artery disease.   There is 95% stenosis in the proximal/mid right coronary artery just before the previously placed stent.   2.  Left ventricular angiography was not performed due to chronic kidney disease.  LVEDP was at the upper limit of normal. 3.  Successful angioplasty and drug-eluting stent placement to the right coronary artery overlapping with the previously placed stent. 4.  Difficult catheterization via the right radial artery due to significant tortuosity of the innominate and right subclavian arteries.  Consider alternative route in the future.   Recommendations: Dual antiplatelet therapy for at least 6 months. Aggressive treatment of risk factors. __________   Carotid artery ultrasound 02/23/2022: Summary:  Right Carotid: Velocities in the right ICA are consistent with a 1-39%  stenosis. Non-hemodynamically significant plaque <50% noted in the  CCA. The ECA appears <50% stenosed.   Left Carotid: Velocities in the left ICA are consistent with a 60-79%  stenosis. Non-hemodynamically significant plaque <50% noted in the  CCA. The ECA appears >50% stenosed.       The bilateral lobes of the thyroid had a globuated  appearance   Suggest follow up study in 12 months.  __________   Renal artery ultrasound 07/16/2020: Summary:  Largest Aortic Diameter: 2.0 cm     Renal:     Right: Normal size right kidney. Normal right Resisitive Index.         Normal cortical thickness of right kidney. No evidence of         right renal artery stenosis. RRV flow present.  Left:  LRV flow present. No evidence of left renal artery stenosis.         Normal size of left kidney. Normal left Resistive Index.         Normal cortical thickness of the left kidney. __________   Outpatient cardiac monitoring 01/2020: Normal sinus rhythm with an average heart rate of 70 bpm. 1 short run of SVT lasted 4 beats. Frequent PVCs with a burden of 10.5%.  Ventricular bigeminy and trigeminy were present.  EKG:  EKG is not ordered today.    Recent Labs: 06/15/2023: ALT 17; BUN 30; Creatinine, Ser 1.28; Hemoglobin 10.2; Platelets 259; Potassium 4.7; Sodium 141; TSH 3.510  Recent Lipid Panel    Component Value Date/Time   CHOL 116 04/19/2023 0437   CHOL 160 07/17/2022 0903   TRIG 116 04/19/2023 0437   HDL 54 04/19/2023 0437   HDL 69 07/17/2022 0903   CHOLHDL 2.1 04/19/2023 0437   VLDL 23 04/19/2023 0437   LDLCALC 39 04/19/2023 0437   LDLCALC 70 07/17/2022 0903    PHYSICAL  EXAM:    VS:  BP (!) 140/70 (BP Location: Left Arm, Patient Position: Sitting, Cuff Size: Normal)   Pulse 88   Ht 5' (1.524 m)   Wt 141 lb 8 oz (64.2 kg)   SpO2 95%   BMI 27.63 kg/m   BMI: Body mass index is 27.63 kg/m.  Physical Exam Vitals reviewed.  Constitutional:      Appearance: She is well-developed.  HENT:     Head: Normocephalic and atraumatic.  Eyes:     General:        Right eye: No discharge.        Left eye: No discharge.  Neck:     Vascular: No JVD.  Cardiovascular:     Rate and Rhythm: Normal rate and regular rhythm.     Heart sounds: Normal heart sounds, S1 normal and  S2 normal. Heart sounds not distant. No midsystolic click and no opening snap. No murmur heard.    No friction rub.  Pulmonary:     Effort: Pulmonary effort is normal. No respiratory distress.     Breath sounds: Normal breath sounds. No decreased breath sounds, wheezing or rales.  Chest:     Chest wall: No tenderness.  Abdominal:     General: There is no distension.  Musculoskeletal:     Cervical back: Normal range of motion.  Skin:    General: Skin is warm and dry.     Nails: There is no clubbing.  Neurological:     Mental Status: She is alert and oriented to person, place, and time.  Psychiatric:        Speech: Speech normal.        Behavior: Behavior normal.        Thought Content: Thought content normal.        Judgment: Judgment normal.     Wt Readings from Last 3 Encounters:  06/22/23 141 lb 8 oz (64.2 kg)  06/14/23 140 lb 11.2 oz (63.8 kg)  05/28/23 140 lb (63.5 kg)     ASSESSMENT & PLAN:   CAD involving the native coronary arteries with exertional dyspnea: Since undergoing PCI to the RCA in 05/2022, she has had ebbs and flows of intermittent dyspnea and fatigue. More recently, she notes an increase in exertional dyspnea/fatigue over the past couple of months. No frank chest pain. We discussed invasive and noninvasive ischemic testing modalities. She would like to avoid cardiac cath [facility]) if at all possible (bad experience with last cath). We will proceed with Lexiscan MPI and echo. Continue aggressive risk factor modification and secondary prevention including DAPT with ASA, Plavix, amlodipine, Crestor, and valsartan.   HTN: Blood pressure is reasonably controlled in the office. She remains on amlodipine 10 mg, spironolactone 25 mg, and valsartan 320 mg.   HLD: LDL 39 in 04/2023, with normal AST/ALT in 06/2023. She remains on rosuvastatin 20 mg.   PVCs: Quiescent. Discontinuing beta-blocker secondary to intolerance as outlined above.   Carotid artery stenosis: Status  post left TCAR in 04/2023 with ultrasound in 05/2023 showing patent stent with no obvious evidence of stenosis. Followed by vascular surgery.   History of CVA: She remains on aspirin, clopidogrel, and rosuvastatin.   History of GI bleed/iron deficiency anemia: Hgb stable. No symptoms concerning for recurrence.   CKD stage III: Stable. Followed by nephrology.    Informed Consent   Shared Decision Making/Informed Consent{ The risks [chest pain, shortness of breath, cardiac arrhythmias, dizziness, blood pressure fluctuations, myocardial infarction, stroke/transient ischemic attack,  nausea, vomiting, allergic reaction, radiation exposure, metallic taste sensation and life-threatening complications (estimated to be 1 in 10,000)], benefits (risk stratification, diagnosing coronary artery disease, treatment guidance) and alternatives of a nuclear stress test were discussed in detail with Ms. Krebs and she agrees to proceed.        Disposition: F/u with Dr. Kirke Corin or an APP in 6 weeks.   Medication Adjustments/Labs and Tests Ordered: Current medicines are reviewed at length with the patient today.  Concerns regarding medicines are outlined above. Medication changes, Labs and Tests ordered today are summarized above and listed in the Patient Instructions accessible in Encounters.   Signed, Eula Listen, PA-C 06/22/2023 4:15 PM     [facility] [place] [address] [place], [place] [address] [phone]

## 2023-06-22 ENCOUNTER — Ambulatory Visit: Payer: Medicare Other | Attending: Physician Assistant | Admitting: Physician Assistant

## 2023-06-22 ENCOUNTER — Encounter: Payer: Self-pay | Admitting: Physician Assistant

## 2023-06-22 VITALS — BP 140/70 | HR 88 | Ht 60.0 in | Wt 141.5 lb

## 2023-06-22 DIAGNOSIS — I10 Essential (primary) hypertension: Secondary | ICD-10-CM

## 2023-06-22 DIAGNOSIS — Z8673 Personal history of transient ischemic attack (TIA), and cerebral infarction without residual deficits: Secondary | ICD-10-CM | POA: Diagnosis not present

## 2023-06-22 DIAGNOSIS — E785 Hyperlipidemia, unspecified: Secondary | ICD-10-CM | POA: Diagnosis not present

## 2023-06-22 DIAGNOSIS — Z8719 Personal history of other diseases of the digestive system: Secondary | ICD-10-CM

## 2023-06-22 DIAGNOSIS — N1832 Chronic kidney disease, stage 3b: Secondary | ICD-10-CM

## 2023-06-22 DIAGNOSIS — I493 Ventricular premature depolarization: Secondary | ICD-10-CM

## 2023-06-22 DIAGNOSIS — R0609 Other forms of dyspnea: Secondary | ICD-10-CM

## 2023-06-22 DIAGNOSIS — I6523 Occlusion and stenosis of bilateral carotid arteries: Secondary | ICD-10-CM | POA: Diagnosis not present

## 2023-06-22 DIAGNOSIS — I25118 Atherosclerotic heart disease of native coronary artery with other forms of angina pectoris: Secondary | ICD-10-CM | POA: Diagnosis not present

## 2023-06-22 DIAGNOSIS — I2510 Atherosclerotic heart disease of native coronary artery without angina pectoris: Secondary | ICD-10-CM

## 2023-06-22 NOTE — Patient Instructions (Signed)
Medication Instructions:  Your Physician recommend you continue on your current medication as directed.    *If you need a refill on your cardiac medications before your next appointment, please call your pharmacy*   Lab Work: None ordered today.  If you have labs (blood work) drawn today and your tests are completely normal, you will receive your results only by: MyChart Message (if you have MyChart) OR A paper copy in the mail If you have any lab test that is abnormal or we need to change your treatment, we will call you to review the results.   Testing/Procedures: Your physician has requested that you have an echocardiogram. Echocardiography is a painless test that uses sound waves to create images of your heart. It provides your doctor with information about the size and shape of your heart and how well your heart's chambers and valves are working.   You may receive an ultrasound enhancing agent through an IV if needed to better visualize your heart during the echo. This procedure takes approximately one hour.  There are no restrictions for this procedure.  This will take place at [address], [place] [address]   Your provider has ordered a Lexiscan/ Exercise Myoview Stress test. This will take place at [facility] Please report to the [facility] medical mall entrance. The volunteers at the first desk will direct you where to go.    [facility] MYOVIEW Please schedule 06/28/23 or 06/29/23 Your provider has ordered a Stress Test with nuclear imaging. The purpose of this test is to evaluate the blood supply to your heart muscle. This procedure is referred to as a "Non-Invasive Stress Test." This is because other than having an IV started in your vein, nothing is inserted or "invades" your body. Cardiac stress tests are done to find areas of poor blood flow to the heart by determining the extent of coronary artery disease (CAD). Some patients exercise on a treadmill, which  naturally increases the blood flow to your heart, while others who are unable to walk on a treadmill due to physical limitations will have a pharmacologic/chemical stress agent called Lexiscan . This medicine will mimic walking on a treadmill by temporarily increasing your coronary blood flow.   Please note: these test may take anywhere between 2-4 hours to complete  How to prepare for your Myoview test:  Nothing to eat for 6 hours prior to the test No caffeine for 24 hours prior to test No smoking 24 hours prior to test. Your medication may be taken with water.  If your doctor stopped a medication because of this test, do not take that medication. Hold Hydrochlorothiazide morning of test.  Ladies, please do not wear dresses.  Skirts or pants are appropriate. Please wear a short sleeve shirt. No perfume, cologne or lotion. Wear comfortable walking shoes. No heels!   PLEASE NOTIFY THE OFFICE AT LEAST 24 HOURS IN ADVANCE IF YOU ARE UNABLE TO KEEP YOUR APPOINTMENT.  [phone] AND  PLEASE NOTIFY NUCLEAR MEDICINE AT [facility] AT LEAST 24 HOURS IN ADVANCE IF YOU ARE UNABLE TO KEEP YOUR APPOINTMENT. [phone]    Follow-Up: At [facility], you and your health needs are our priority.  As part of our continuing mission to provide you with exceptional heart care, we have created designated Provider Care Teams.  These Care Teams include your primary Cardiologist (physician) and Advanced Practice Providers (APPs -  Physician Assistants and Nurse Practitioners) who all work together to provide you with  the care you need, when you need it.  We recommend signing up for the patient portal called "MyChart".  Sign up information is provided on this After Visit Summary.  MyChart is used to connect with patients for Virtual Visits (Telemedicine).  Patients are able to view lab/test results, encounter notes, upcoming appointments, etc.  Non-urgent messages can be sent to your provider as well.   To  learn more about what you can do with MyChart, go to [email or web address].    Your next appointment:   6 week(s)  Provider:   You may see Lorine Bears, MD or one of the following Advanced Practice Providers on your designated Care Team:   Nicolasa Ducking, NP Eula Listen, PA-C Cadence Fransico Michael, PA-C Charlsie Quest, NP

## 2023-06-28 ENCOUNTER — Encounter
Admission: RE | Admit: 2023-06-28 | Discharge: 2023-06-28 | Disposition: A | Discharge status: Home / Self Care | Payer: Medicare Other | Source: Ambulatory Visit | Attending: Physician Assistant | Admitting: Physician Assistant

## 2023-06-28 DIAGNOSIS — R0609 Other forms of dyspnea: Secondary | ICD-10-CM | POA: Insufficient documentation

## 2023-06-28 LAB — NM MYOCAR MULTI W/SPECT W/WALL MOTION / EF
LV dias vol: 33 mL (ref 46–106)
LV sys vol: 11 mL
Nuc Stress EF: 67 %
Peak HR: 85 {beats}/min
Percent HR: 62 %
Rest HR: 64 {beats}/min
Rest Nuclear Isotope Dose: 10.3 mCi
SDS: 0
SRS: 2
SSS: 0
ST Depression (mm): 0 mm
Stress Nuclear Isotope Dose: 30.2 mCi
TID: 0.73

## 2023-06-28 MED ORDER — REGADENOSON 0.4 MG/5ML IV SOLN
0.4000 mg | Freq: Once | INTRAVENOUS | Status: AC
  Administered 2023-06-28: 0.4 mg via INTRAVENOUS

## 2023-06-28 MED ORDER — TECHNETIUM TC 99M TETROFOSMIN IV KIT
10.2800 | PACK | Freq: Once | INTRAVENOUS | Status: AC | PRN
  Administered 2023-06-28: 10.28 via INTRAVENOUS

## 2023-06-28 MED ORDER — TECHNETIUM TC 99M TETROFOSMIN IV KIT
30.1600 | PACK | Freq: Once | INTRAVENOUS | Status: AC | PRN
  Administered 2023-06-28: 30.16 via INTRAVENOUS

## 2023-06-29 ENCOUNTER — Other Ambulatory Visit: Payer: Self-pay

## 2023-07-05 ENCOUNTER — Other Ambulatory Visit: Payer: Self-pay | Admitting: Physician Assistant

## 2023-07-13 DIAGNOSIS — L400 Psoriasis vulgaris: Secondary | ICD-10-CM | POA: Diagnosis not present

## 2023-07-16 ENCOUNTER — Other Ambulatory Visit: Payer: Self-pay | Admitting: Physician Assistant

## 2023-07-16 DIAGNOSIS — Z8719 Personal history of other diseases of the digestive system: Secondary | ICD-10-CM

## 2023-07-16 DIAGNOSIS — I6523 Occlusion and stenosis of bilateral carotid arteries: Secondary | ICD-10-CM

## 2023-07-16 DIAGNOSIS — R0609 Other forms of dyspnea: Secondary | ICD-10-CM

## 2023-07-16 DIAGNOSIS — I10 Essential (primary) hypertension: Secondary | ICD-10-CM

## 2023-07-16 DIAGNOSIS — Z8673 Personal history of transient ischemic attack (TIA), and cerebral infarction without residual deficits: Secondary | ICD-10-CM

## 2023-07-16 DIAGNOSIS — I493 Ventricular premature depolarization: Secondary | ICD-10-CM

## 2023-07-16 DIAGNOSIS — N1832 Chronic kidney disease, stage 3b: Secondary | ICD-10-CM

## 2023-07-16 DIAGNOSIS — I25118 Atherosclerotic heart disease of native coronary artery with other forms of angina pectoris: Secondary | ICD-10-CM

## 2023-07-16 DIAGNOSIS — E785 Hyperlipidemia, unspecified: Secondary | ICD-10-CM

## 2023-07-25 ENCOUNTER — Ambulatory Visit: Payer: Medicare Other | Attending: Physician Assistant

## 2023-07-25 DIAGNOSIS — R0609 Other forms of dyspnea: Secondary | ICD-10-CM

## 2023-07-25 LAB — ECHOCARDIOGRAM COMPLETE
Area-P 1/2: 3.21 cm2
S' Lateral: 2.6 cm

## 2023-07-31 NOTE — Progress Notes (Signed)
Cardiology Office Note    Date:  08/03/2023   ID:  Andrea Hart, DOB 04/03/1936, MRN [id number]  PCP:  Erasmo Downer, MD  Cardiologist:  Lorine Bears, MD  Electrophysiologist:  None   Chief Complaint: Follow up  History of Present Illness:   Andrea Hart is a 87 y.o. female with history of CAD status post stenting in 2013 to the RCA status post PCI/DES to the RCA overlapping the previously placed stent on 05/24/2022, CKD stage III, prior CVA x2, HTN, HLD, carotid artery disease status post left TCAR in 04/2023, GI bleeding in 02/2021, PVCs, hypothyroidism, and asthma who presents for follow-up of echo and Lexiscan MPI.   She underwent stenting of an unknown vessel in [place], [place] in 2013.  In 2017, she suffered a stroke.  ILR did not show any evidence of A-fib, and she has been maintained on aspirin and clopidogrel since.  In 01/2016, she complained of presyncope and underwent event monitoring.  This showed frequent PVCs with a 10% burden.  She reported a long history of PVCs and has been managed with beta-blocker therapy.  She has history of difficult to control hypertension with renal artery ultrasound in 07/2020 showing no evidence of RAS.  Carotid artery ultrasound in 11/2020 showed moderate, nonobstructive bilateral left greater than right ICA disease.  She was admitted in 02/2021 with rectal bleeding with GI feeling the presentation was most consistent with an acute diverticular bleed.  Aspirin and clopidogrel were held and she underwent outpatient colonoscopy in 04/2021 which demonstrated nonbleeding internal hemorrhoids, moderate diverticulosis without evidence of diverticular bleeding.  She was subsequently resumed on aspirin and clopidogrel.  She was seen in the office in 02/2022 and was without symptoms of angina or decompensation.  She remained active with multiple social events.  Carotid artery ultrasound from 02/2022 demonstrated 1 to 39% RICA stenosis and 60 to 79% LICA stenosis  (previously 50 to 69%).  She was seen on 05/16/2022 noting a 9 to 83-month history of exertional dyspnea that had become more pronounced.  Symptoms felt similar, though not as severe, to her prior angina leading up to PCI in 2013.  She was without frank chest pain.  Given concerning symptoms, she underwent LHC on 05/24/2022 which showed moderately to severely calcified coronary arteries with significant one-vessel CAD.  There was 95% stenosis in the proximal/mid RCA just before the previously placed stent.  LVEDP was upper limit of normal.  She underwent successful PCI/DES to the RCA overlapping with the previously placed stent.  Post-cath labs notable for a hemoglobin of 9.4.  Carvedilol was decreased secondary to bradycardia.     She was seen in the office on 05/30/2022 and noted improvement in her dyspnea following PCI to the RCA.  She was frustrated as she continued to be fatigued, though this is subsequently improved with cardiac rehab.  Bradycardia improved following the tapering of carvedilol.  Echo on 06/22/2022 demonstrated an EF of 60 to 65%, no regional wall motion abnormalities, moderate LVH, grade 1 diastolic dysfunction, normal RV systolic function and ventricular cavity size, mildly dilated left atrium, mild mitral regurgitation, aortic valve sclerosis without evidence of stenosis, and an estimated right atrial pressure of 3 mmHg.     She was subsequently rechallenged on carvedilol due to elevated BPs, though again reported intolerance leading to its discontinuation.  However, more recently carvedilol was again restarted in 02/2023 due to continued elevated BP readings.  She dropped off BP readings on 04/10/2023, with  readings typically in the 130s to 150s systolic with an occasional reading in the 170s with rare readings in the 180s to 190s over 70s to 90s with heart rates largely in the 60s to 70s bpm with occasional reading in the upper 50s bpm.  At that time, she reported taking carvedilol 3.125 mg  twice daily, amlodipine 10 mg daily, valsartan 320 mg daily, and spironolactone 12.5 mg daily.  It was recommended she titrate spironolactone to 25 mg daily.   Carotid artery ultrasound from 04/10/2023 showed showed 80 to 99% left ICA stenosis with 1 to 39% right ICA stenosis.  This was a progression of the left ICA stenosis that was previously noted to be 60 to 79% in 02/2022.  Subsequent CTA of the head/neck on 04/12/2023 demonstrated 75% stenosis of the proximal left ICA just beyond the bifurcation as well as atherosclerotic calcification at the right carotid bifurcation and internal carotid arteries without significant stenosis.  She was evaluated by vascular surgery and underwent left TCAR on 04/18/2023 for asymptomatic critical ICA stenosis.  Follow-up carotid artery ultrasound on 05/28/2023 showed 1 to 39% right ICA stenosis with patent left ICA stenosis without obvious evidence of stenosis.  She was last seen in the office on 06/22/2023 noting an increase in exertional fatigue and dyspnea that became more noticeable following TCAR.  She was without frank chest pain.  Lexiscan MPI on 06/28/2023 showed no significant ischemia with an EF of 70%.  CT attenuation corrected images showed three-vessel coronary artery calcification and aortic atherosclerosis.  Overall, this was a low risk scan.  Echo on 07/25/2023 showed an EF of 60 to 65%, no regional wall motion abnormalities, mild LVH, grade 1 diastolic dysfunction, normal RV systolic function and ventricular cavity size, moderately dilated left atrium, mild mitral regurgitation with moderate mitral annular calcification, aortic valve sclerosis without evidence of stenosis, and an estimated right atrial pressure of 3 mmHg.  She comes in today feeling about the same as she has over her last several visits, though a little improved off of statin therapy.  She continues to note exertional shortness of breath and fatigue.  No frank chest pain.  No dizziness, presyncope,  or syncope.  No lower extremity swelling or progressive orthopnea.  No hematochezia, melena, hemoptysis, hematemesis, or hematuria.  She does report a several month history of epistaxis, typically at nighttime.  She reports a prior history of asthma and wonders if this is contributing to her dyspnea.   Labs independently reviewed: 06/2023 - Hgb 10.2, PLT 259, BUN 30, serum creatinine 1.28, potassium 4.7, albumin 4.4, AST/ALT normal, A1c 6.1, TSH normal 04/2023 - TC 160, TG 116, HDL 54, LDL 39  Past Medical History:  Diagnosis Date   Abdominal hernia    Arthritis    Asthma    Carotid arterial disease (HCC)    a. 11/2020 Carotid U/S: Less than 50% RICA, 50-69% LICA.   CKD (chronic kidney disease), stage III (HCC)    Coronary artery disease    a. 2013 s/p PCI and stent placement in [facility] done for stable angina.   GI bleeding 02/02/2021   History of blood transfusion    History of hiatal hernia    had  surgery   Hyperlipidemia    Hypertension    Hypothyroidism    Pneumonia    years ago   PVC's (premature ventricular contractions)    a. 01/2020 Zio: RSR, 70, frequent PVCs with 10.5% burden.   Stroke [facility])  a. 2017 - ILR did not show afib.    Past Surgical History:  Procedure Laterality Date   ABDOMINAL HYSTERECTOMY     APPENDECTOMY     CATARACT EXTRACTION Bilateral    CHOLECYSTECTOMY     COLONOSCOPY WITH PROPOFOL N/A 04/06/2021   Procedure: COLONOSCOPY WITH PROPOFOL;  Surgeon: Toledo, Boykin Nearing, MD;  Location: [facility] ENDOSCOPY;  Service: Gastroenterology;  Laterality: N/A;   CORONARY ANGIOPLASTY  2013   1xStent [facility] [place].   CORONARY STENT INTERVENTION N/A 05/24/2022   Procedure: CORONARY STENT INTERVENTION;  Surgeon: Iran Ouch, MD;  Location: [facility] INVASIVE CV LAB;  Service: Cardiovascular;  Laterality: N/A;   EYE SURGERY     HAND SURGERY Right    LEFT HEART CATH AND CORONARY ANGIOGRAPHY Left 05/24/2022   Procedure: LEFT HEART CATH AND CORONARY  ANGIOGRAPHY;  Surgeon: Iran Ouch, MD;  Location: [facility] INVASIVE CV LAB;  Service: Cardiovascular;  Laterality: Left;   LOOP RECORDER IMPLANT     NISSEN FUNDOPLICATION  2016   REPLACEMENT TOTAL KNEE Bilateral 2017   TOTAL HIP ARTHROPLASTY Right    TOTAL VAGINAL HYSTERECTOMY     TRANSCAROTID ARTERY REVASCULARIZATION  Left 04/18/2023   Procedure: LEFT Transcarotid Artery Revascularization;  Surgeon: Nada Libman, MD;  Location: [facility] OR;  Service: Vascular;  Laterality: Left;   ULTRASOUND GUIDANCE FOR VASCULAR ACCESS  04/18/2023   Procedure: ULTRASOUND GUIDANCE FOR VASCULAR ACCESS;  Surgeon: Nada Libman, MD;  Location: [facility] OR;  Service: Vascular;;    Current Medications: Current Meds  Medication Sig   acetaminophen (TYLENOL) 500 MG tablet Take 1,000 mg by mouth at bedtime.   albuterol (VENTOLIN HFA) 108 (90 Base) MCG/ACT inhaler Inhale 2 puffs into the lungs every 4 (four) hours as needed for wheezing or shortness of breath.   amLODipine (NORVASC) 10 MG tablet Take 1 tablet (10 mg total) by mouth daily.   aspirin EC 81 MG tablet Take 81 mg by mouth every evening.   budesonide-formoterol (SYMBICORT) 160-4.5 MCG/ACT inhaler Inhale 2 puffs into the lungs 2 (two) times daily.   cetirizine (ZYRTEC) 10 MG tablet Take 1 tablet (10 mg total) by mouth daily. (Patient taking differently: Take 10 mg by mouth at bedtime.)   Cholecalciferol (VITAMIN D3) 125 MCG (5000 UT) TABS Take by mouth.   clobetasol (TEMOVATE) 0.05 % external solution Apply 1 Application topically in the morning and at bedtime.   clopidogrel (PLAVIX) 75 MG tablet Take 1 tablet (75 mg total) by mouth daily.   fluticasone (FLONASE) 50 MCG/ACT nasal spray Place 2 sprays into both nostrils daily. (Patient taking differently: Place 2 sprays into both nostrils daily as needed (allergies.).)   ketoconazole (NIZORAL) 2 % shampoo Apply topically.   levothyroxine (SYNTHROID) 75 MCG tablet Take 1 tablet (75 mcg total) by mouth daily  before breakfast.   meclizine (ANTIVERT) 25 MG tablet Take 0.5 tablets (12.5 mg total) by mouth 3 (three) times daily as needed for dizziness or nausea.   methocarbamol (ROBAXIN) 500 MG tablet Take 1 tablet (500 mg total) by mouth at bedtime as needed for muscle spasms.   montelukast (SINGULAIR) 10 MG tablet Take 1 tablet (10 mg total) by mouth at bedtime.   Multiple Vitamin (MULTIVITAMIN WITH MINERALS) TABS tablet Take 1 tablet by mouth daily with lunch. One A Day for Women   omeprazole (PRILOSEC) 40 MG capsule Take 1 capsule (40 mg total) by mouth 2 (two) times daily.   sodium bicarbonate 650 MG tablet Take 1,300 mg  by mouth 2 (two) times daily.   triamcinolone cream (KENALOG) 0.1 % Apply 1 Application topically 2 (two) times daily as needed (psoriasis (elbows)).   TURMERIC PO Take 1 capsule by mouth daily with lunch. Qunol Joint Comfort With Turmeric Capsules   valsartan (DIOVAN) 320 MG tablet TAKE 1 TABLET BY MOUTH EVERY DAY   zinc sulfate 220 (50 Zn) MG capsule Take 220 mg by mouth daily with lunch.    Allergies:   Patient has no known allergies.   Social History   Socioeconomic History   Marital status: Widowed    Spouse name: Not on file   Number of children: 2   Years of education: Not on file   Highest education level: Some college, no degree  Occupational History    Comment: retired Photographer  Tobacco Use   Smoking status: Never   Smokeless tobacco: Never  Vaping Use   Vaping status: Never Used  Substance and Sexual Activity   Alcohol use: Not Currently    Comment: Occasional   Drug use: Never   Sexual activity: Never    Birth control/protection: Surgical  Other Topics Concern   Not on file  Social History Narrative   Moved here in 2019, had been living in [place].    Born here, raised daughters here.    Lives with daughter now, [place].   Lost one daughter in car wreck.    Social Determinants of Health   Financial Resource Strain: Low Risk  (11/29/2021)    Overall Financial Resource Strain (CARDIA)    Difficulty of Paying Living Expenses: Not hard at all  Food Insecurity: No Food Insecurity (04/20/2023)   Hunger Vital Sign    Worried About Running Out of Food in the Last Year: Never true    Ran Out of Food in the Last Year: Never true  Transportation Needs: No Transportation Needs (04/20/2023)   PRAPARE - Administrator, Civil Service (Medical): No    Lack of Transportation (Non-Medical): No  Physical Activity: Insufficiently Active (11/29/2021)   Exercise Vital Sign    Days of Exercise per Week: 2 days    Minutes of Exercise per Session: 20 min  Stress: No Stress Concern Present (11/29/2021)   [employer] of Occupational Health - Occupational Stress Questionnaire    Feeling of Stress : Not at all  Social Connections: Moderately Integrated (11/29/2021)   Social Connection and Isolation Panel [NHANES]    Frequency of Communication with Friends and Family: Three times a week    Frequency of Social Gatherings with Friends and Family: Twice a week    Attends Religious Services: More than 4 times per year    Active Member of [employer] or Organizations: Yes    Attends Banker Meetings: More than 4 times per year    Marital Status: Widowed     Family History:  The patient's family history includes Heart Problems in her brother, father, and mother; Heart attack in her brother and mother. There is no history of Colon cancer or Breast cancer.  ROS:   12-point review of systems is negative unless otherwise noted in the HPI.   EKGs/Labs/Other Studies Reviewed:    Studies reviewed were summarized above. The additional studies were reviewed today:  2D echo 07/25/2023: 1. Left ventricular ejection fraction, by estimation, is 60 to 65%. The  left ventricle has normal function. The left ventricle has no regional  wall motion abnormalities. There is mild left ventricular hypertrophy.  Left ventricular  diastolic parameters   are consistent with Grade I diastolic dysfunction (impaired relaxation).  The average left ventricular global longitudinal strain is -15.6 %.   2. Right ventricular systolic function is normal. The right ventricular  size is normal. Tricuspid regurgitation signal is inadequate for assessing  PA pressure.   3. Left atrial size was moderately dilated.   4. The mitral valve is normal in structure. Mild mitral valve  regurgitation. No evidence of mitral stenosis. Moderate mitral annular  calcification.   5. The aortic valve is normal in structure. There is mild calcification  of the aortic valve. Aortic valve regurgitation is not visualized. Aortic  valve sclerosis/calcification is present, without any evidence of aortic  stenosis.   6. The inferior vena cava is normal in size with greater than 50%  respiratory variability, suggesting right atrial pressure of 3 mmHg.  __________  Eugenie Birks MPI 06/28/2023: Pharmacological myocardial perfusion imaging study with no significant  ischemia Normal wall motion, EF estimated at 70% No EKG changes concerning for ischemia at peak stress or in recovery. Left bundle branch block noted during infusion that by report returned to narrow complex QRS in recovery CT attenuation correction images with three-vessel coronary calcification, moderate aortic atherosclerosis Low risk scan __________  Carotid artery ultrasound 05/28/2023: Summary:  Right Carotid: Velocities in the right ICA are consistent with a 1-39% stenosis.   Left Carotid: The ECA appears >50% stenosed. Patent stent without obvious evidence of stenosis.   Vertebrals: Bilateral vertebral arteries demonstrate antegrade flow.  __________   Carotid artery ultrasound 04/10/2023: Summary:  Right Carotid: Velocities in the right ICA are consistent with a 1-39% stenosis.   Left Carotid: Velocities in the left ICA are consistent with a 80-99% stenosis. The ECA appears >50% stenosed.  __________    2D echo 06/22/2022: 1. Left ventricular ejection fraction, by estimation, is 60 to 65%. The  left ventricle has normal function. The left ventricle has no regional  wall motion abnormalities. There is moderate left ventricular hypertrophy.  Left ventricular diastolic  parameters are consistent with Grade I diastolic dysfunction (impaired  relaxation).   2. Right ventricular systolic function is normal. The right ventricular  size is normal.   3. Left atrial size was mildly dilated.   4. The mitral valve is normal in structure. Mild mitral valve  regurgitation. No evidence of mitral stenosis.   5. The aortic valve is normal in structure. Aortic valve regurgitation is  not visualized. Aortic valve sclerosis/calcification is present, without  any evidence of aortic stenosis.   6. The inferior vena cava is normal in size with greater than 50%  respiratory variability, suggesting right atrial pressure of 3 mmHg. __________   LHC 05/24/2022:   Mid RCA lesion is 10% stenosed.   Prox RCA to Mid RCA lesion is 95% stenosed.   Prox LAD to Mid LAD lesion is 20% stenosed.   A drug-eluting stent was successfully placed using a STENT ONYX FRONTIER 3.0X18.   Post intervention, there is a 0% residual stenosis.   1.  Moderately to severely calcified coronary arteries with significant one-vessel coronary artery disease.   There is 95% stenosis in the proximal/mid right coronary artery just before the previously placed stent.   2.  Left ventricular angiography was not performed due to chronic kidney disease.  LVEDP was at the upper limit of normal. 3.  Successful angioplasty and drug-eluting stent placement to the right coronary artery overlapping with the previously placed stent. 4.  Difficult catheterization via  the right radial artery due to significant tortuosity of the innominate and right subclavian arteries.  Consider alternative route in the future.   Recommendations: Dual antiplatelet therapy  for at least 6 months. Aggressive treatment of risk factors. __________   Carotid artery ultrasound 02/23/2022: Summary:  Right Carotid: Velocities in the right ICA are consistent with a 1-39%  stenosis. Non-hemodynamically significant plaque <50% noted in the  CCA. The ECA appears <50% stenosed.   Left Carotid: Velocities in the left ICA are consistent with a 60-79%  stenosis. Non-hemodynamically significant plaque <50% noted in the  CCA. The ECA appears >50% stenosed.      The bilateral lobes of the thyroid had a globuated  appearance   Suggest follow up study in 12 months.  __________   Renal artery ultrasound 07/16/2020: Summary:  Largest Aortic Diameter: 2.0 cm     Renal:     Right: Normal size right kidney. Normal right Resisitive Index.         Normal cortical thickness of right kidney. No evidence of         right renal artery stenosis. RRV flow present.  Left:  LRV flow present. No evidence of left renal artery stenosis.         Normal size of left kidney. Normal left Resistive Index.         Normal cortical thickness of the left kidney. __________   Outpatient cardiac monitoring 01/2020: Normal sinus rhythm with an average heart rate of 70 bpm. 1 short run of SVT lasted 4 beats. Frequent PVCs with a burden of 10.5%.  Ventricular bigeminy and trigeminy were present.   EKG:  EKG is not ordered today.   Recent Labs: 06/15/2023: ALT 17; BUN 30; Creatinine, Ser 1.28; Hemoglobin 10.2; Platelets 259; Potassium 4.7; Sodium 141; TSH 3.510  Recent Lipid Panel    Component Value Date/Time   CHOL 116 04/19/2023 0437   CHOL 160 07/17/2022 0903   TRIG 116 04/19/2023 0437   HDL 54 04/19/2023 0437   HDL 69 07/17/2022 0903   CHOLHDL 2.1 04/19/2023 0437   VLDL 23 04/19/2023 0437   LDLCALC 39 04/19/2023 0437   LDLCALC 70 07/17/2022 0903    PHYSICAL EXAM:    VS:  BP (!) 141/82 (BP Location: Left Arm, Patient Position: Sitting, Cuff Size: Normal)   Pulse 73   Ht 5'  (1.524 m)   Wt 141 lb 12.8 oz (64.3 kg)   SpO2 97%   BMI 27.69 kg/m   BMI: Body mass index is 27.69 kg/m.  Physical Exam Vitals reviewed.  Constitutional:      Appearance: She is well-developed.  HENT:     Head: Normocephalic and atraumatic.  Eyes:     General:        Right eye: No discharge.        Left eye: No discharge.  Neck:     Vascular: No JVD.  Cardiovascular:     Rate and Rhythm: Normal rate and regular rhythm.     Heart sounds: Normal heart sounds, S1 normal and S2 normal. Heart sounds not distant. No midsystolic click and no opening snap. No murmur heard.    No friction rub.  Pulmonary:     Effort: Pulmonary effort is normal. No respiratory distress.     Breath sounds: Normal breath sounds. No decreased breath sounds, wheezing or rales.  Chest:     Chest wall: No tenderness.  Abdominal:     General: There is no distension.  Musculoskeletal:     Cervical back: Normal range of motion.     Right lower leg: No edema.     Left lower leg: No edema.  Skin:    General: Skin is warm and dry.     Nails: There is no clubbing.  Neurological:     Mental Status: She is alert and oriented to person, place, and time.  Psychiatric:        Speech: Speech normal.        Behavior: Behavior normal.        Thought Content: Thought content normal.        Judgment: Judgment normal.     Wt Readings from Last 3 Encounters:  08/03/23 141 lb 12.8 oz (64.3 kg)  06/22/23 141 lb 8 oz (64.2 kg)  06/14/23 140 lb 11.2 oz (63.8 kg)     ASSESSMENT & PLAN:   CAD involving the native coronary arteries without angina: Without symptoms of frank angina.  Recent Lexiscan MPI showed no evidence of ischemia and was overall low risk.  Continue aggressive risk factor modification and secondary prevention including aspirin, clopidogrel, amlodipine, and valsartan.  Dyspnea/fatigue: She continues to note significant dyspnea and fatigue.  Lexiscan MPI showed no evidence of ischemia low risk.  Echo  showed preserved LV systolic function with grade 1 diastolic dysfunction, normal RV systolic function and ventricular cavity size mild mitral regurgitation, aortic valve sclerosis without evidence of stenosis, and a normal right atrial pressure.  She prefers to defer cardiac cath, with low risk Myoview, this is reasonable.  May need to consider RHC.  Referred to pulmonology for PFTs.  May need to consider CPX.  Obtain BMP, CBC, and TSH.  Query progressive anemia with history of GI bleed and now epistaxis.  Cannot exclude some degree of deconditioning at this time.  HTN: Blood pressure is mildly elevated in the office.  Remains on amlodipine, spironolactone, and valsartan.  HLD: LDL 39 in 04/2023 with normal AST/ALT in 06/2023.  Rosuvastatin on hold with noted slight improvement of fatigue off therapy.  In follow-up, we will have further discussions of PCSK9 inhibitor or bempedoic acid.  PVCs: Quiescent.  No longer on beta-blocker, though fatigue did not improve off therapy.  Defer resumption of beta-blocker at this time given ongoing fatigue.  Carotid artery stenosis: Status post left TCAR in 04/2023 with ultrasound in 05/2023 showing patent stent with no obvious evidence of stenosis. Followed by vascular surgery.   History of CVA: No new deficits.  She remains on aspirin and clopidogrel.  Rosuvastatin held with some improvement, though not resolution of fatigue.  History of GI bleed/iron deficiency anemia: Denies symptoms of bleeding.  Check CBC.  Query if her anemia is contributing to her shortness of breath and fatigue.  CKD stage III: Stable on most recent check.  Check BMP.  Epistaxis: Referred to ENT.     Disposition: F/u with Dr. Kirke Corin or an APP in 2-3 months.   Medication Adjustments/Labs and Tests Ordered: Current medicines are reviewed at length with the patient today.  Concerns regarding medicines are outlined above. Medication changes, Labs and Tests ordered today are summarized  above and listed in the Patient Instructions accessible in Encounters.   Signed, Eula Listen, PA-C 08/03/2023 12:39 PM     [facility] [place] [address] [place], [place] [address] [phone]

## 2023-08-03 ENCOUNTER — Encounter: Payer: Self-pay | Admitting: Physician Assistant

## 2023-08-03 ENCOUNTER — Ambulatory Visit: Payer: Medicare Other | Attending: Physician Assistant | Admitting: Physician Assistant

## 2023-08-03 VITALS — BP 141/82 | HR 73 | Ht 60.0 in | Wt 141.8 lb

## 2023-08-03 DIAGNOSIS — N1832 Chronic kidney disease, stage 3b: Secondary | ICD-10-CM | POA: Diagnosis not present

## 2023-08-03 DIAGNOSIS — R040 Epistaxis: Secondary | ICD-10-CM

## 2023-08-03 DIAGNOSIS — I10 Essential (primary) hypertension: Secondary | ICD-10-CM | POA: Diagnosis not present

## 2023-08-03 DIAGNOSIS — I493 Ventricular premature depolarization: Secondary | ICD-10-CM | POA: Diagnosis not present

## 2023-08-03 DIAGNOSIS — R0602 Shortness of breath: Secondary | ICD-10-CM | POA: Diagnosis not present

## 2023-08-03 DIAGNOSIS — E785 Hyperlipidemia, unspecified: Secondary | ICD-10-CM | POA: Diagnosis not present

## 2023-08-03 DIAGNOSIS — D508 Other iron deficiency anemias: Secondary | ICD-10-CM | POA: Diagnosis not present

## 2023-08-03 DIAGNOSIS — Z8719 Personal history of other diseases of the digestive system: Secondary | ICD-10-CM | POA: Diagnosis not present

## 2023-08-03 DIAGNOSIS — Z8673 Personal history of transient ischemic attack (TIA), and cerebral infarction without residual deficits: Secondary | ICD-10-CM

## 2023-08-03 DIAGNOSIS — R5383 Other fatigue: Secondary | ICD-10-CM

## 2023-08-03 DIAGNOSIS — I6523 Occlusion and stenosis of bilateral carotid arteries: Secondary | ICD-10-CM | POA: Diagnosis not present

## 2023-08-03 DIAGNOSIS — I25118 Atherosclerotic heart disease of native coronary artery with other forms of angina pectoris: Secondary | ICD-10-CM

## 2023-08-03 NOTE — Patient Instructions (Addendum)
Medication Instructions:  Your Physician recommend you continue on your current medication as directed.    *If you need a refill on your cardiac medications before your next appointment, please call your pharmacy*   Lab Work: Your provider would like for you to have following labs drawn today CBC, BMT, TSH.   If you have labs (blood work) drawn today and your tests are completely normal, you will receive your results only by: MyChart Message (if you have MyChart) OR A paper copy in the mail If you have any lab test that is abnormal or we need to change your treatment, we will call you to review the results.   Testing/Procedures: NONE   Follow-Up: At [facility], you and your health needs are our priority.  As part of our continuing mission to provide you with exceptional heart care, we have created designated Provider Care Teams.  These Care Teams include your primary Cardiologist (physician) and Advanced Practice Providers (APPs -  Physician Assistants and Nurse Practitioners) who all work together to provide you with the care you need, when you need it.  We recommend signing up for the patient portal called "MyChart".  Sign up information is provided on this After Visit Summary.  MyChart is used to connect with patients for Virtual Visits (Telemedicine).  Patients are able to view lab/test results, encounter notes, upcoming appointments, etc.  Non-urgent messages can be sent to your provider as well.   To learn more about what you can do with MyChart, go to [email or web address].    Your next appointment:   2 or 3 month(s)  Provider:   You may see Lorine Bears, MD or one of the following Advanced Practice Providers on your designated Care Team:   Nicolasa Ducking, NP Eula Listen, PA-C Cadence Fransico Michael, PA-C Charlsie Quest, NP   Other Instructions REFERRALS: Pulmonary Clinic ENT Clinic

## 2023-08-04 LAB — BASIC METABOLIC PANEL
BUN/Creatinine Ratio: 16 (ref 12–28)
BUN: 19 mg/dL (ref 8–27)
CO2: 19 mmol/L — ABNORMAL LOW (ref 20–29)
Calcium: 9.8 mg/dL (ref 8.7–10.3)
Chloride: 103 mmol/L (ref 96–106)
Creatinine, Ser: 1.2 mg/dL — ABNORMAL HIGH (ref 0.57–1.00)
Glucose: 117 mg/dL — ABNORMAL HIGH (ref 70–99)
Potassium: 3.9 mmol/L (ref 3.5–5.2)
Sodium: 139 mmol/L (ref 134–144)
eGFR: 45 mL/min/{1.73_m2} — ABNORMAL LOW (ref >59)

## 2023-08-04 LAB — CBC
Hematocrit: 34.5 % (ref 34.0–46.6)
Hemoglobin: 11.2 g/dL (ref 11.1–15.9)
MCH: 29.2 pg (ref 26.6–33.0)
MCHC: 32.5 g/dL (ref 31.5–35.7)
MCV: 90 fL (ref 79–97)
Platelets: 279 10*3/uL (ref 150–450)
RBC: 3.84 x10E6/uL (ref 3.77–5.28)
RDW: 11.9 % (ref 11.7–15.4)
WBC: 8.4 10*3/uL (ref 3.4–10.8)

## 2023-08-04 LAB — TSH: TSH: 5.91 u[IU]/mL — ABNORMAL HIGH (ref 0.450–4.500)

## 2023-08-07 ENCOUNTER — Other Ambulatory Visit: Payer: Self-pay

## 2023-08-07 DIAGNOSIS — Z79899 Other long term (current) drug therapy: Secondary | ICD-10-CM

## 2023-08-07 DIAGNOSIS — R7989 Other specified abnormal findings of blood chemistry: Secondary | ICD-10-CM

## 2023-08-09 ENCOUNTER — Ambulatory Visit (INDEPENDENT_AMBULATORY_CARE_PROVIDER_SITE_OTHER): Payer: Medicare Other | Admitting: Student in an Organized Health Care Education/Training Program

## 2023-08-09 ENCOUNTER — Encounter: Payer: Self-pay | Admitting: Student in an Organized Health Care Education/Training Program

## 2023-08-09 VITALS — BP 118/60 | HR 76 | Temp 97.8°F | Ht 60.0 in | Wt 143.2 lb

## 2023-08-09 DIAGNOSIS — R0609 Other forms of dyspnea: Secondary | ICD-10-CM

## 2023-08-09 DIAGNOSIS — J4520 Mild intermittent asthma, uncomplicated: Secondary | ICD-10-CM | POA: Diagnosis not present

## 2023-08-09 DIAGNOSIS — I2510 Atherosclerotic heart disease of native coronary artery without angina pectoris: Secondary | ICD-10-CM | POA: Diagnosis not present

## 2023-08-09 NOTE — Progress Notes (Signed)
Synopsis: Referred in for dyspnea on exertion by Sondra Barges, PA-C  Assessment & Plan:   1. DOE (dyspnea on exertion) 2. Asthma 3. CAD  The patient is presenting for the evaluation of shortness of breath with exertion in the setting of significant cardiac history as well as history of asthma. Her CAD is currently medically managed with beta blockers and anti-platelet agents (aspirin and clopidogrel). She underwent stenting of the RCA in 2023 and a recent lexiscan was low risk. Patient also has a history of asthma that she reports hasn't been active for a long time - she denies wheezing or cough, and her lung exam is clear. She is maintained on Symbicort with which she is compliant.  I have reviewed the patient's chart and imaging studies that have included a CT scan of the neck. This CT has included images from the upper chest and shows the upper lobes of both lungs to have normal parenchyma. Granted, these images don't include the bases of both lungs and aren't conclusive. Furthermore, the images do show a significantly enlarged esophagus to the level of the upper trachea. I suspect a recurrence of her hiatal hernia given history of  such. Review of her dispense report doesn't reveal any medication that could be contributing to shortness of breath.  My differential for her shortness of breath includes the following:  -obstructive lung disease (such as asthma) at low lung volumes (given lack of wheezing). -worsening hiatal hernia resulting in decreased expiratory reserve volume and subsequently vital capacity (VC) and functional residual capacity (FRC). -anginal equivalent given history of coronary artery disease -undiagnosed interstitial lung disease -deconditioning  I plan to initiate her workup with full pulmonary function tests (spirometry, lung volumes, DLCO) and a double contrast esophagogram. Should these not reveal the nature of her dyspnea, I would consider expanding workup to  include a high resolution chest CT as well as a cardiopulmonary exercise test.  - Pulmonary Function Test [facility] Only; Future - DG ESOPHAGUS W DOUBLE CM (HD); Future   Return in about 4 weeks (around 09/06/2023).  I spent 60 minutes caring for this patient today, including preparing to see the patient, obtaining a medical history , reviewing a separately obtained history, performing a medically appropriate examination and/or evaluation, counseling and educating the patient/family/caregiver, ordering medications, tests, or procedures, documenting clinical information in the electronic health record, and independently interpreting results (not separately reported/billed) and communicating results to the patient/family/caregiver  [name], MD [facility]  End of visit medications:  No orders of the defined types were placed in this encounter.    Current Outpatient Medications:    acetaminophen (TYLENOL) 500 MG tablet, Take 1,000 mg by mouth at bedtime., Disp: , Rfl:    albuterol (VENTOLIN HFA) 108 (90 Base) MCG/ACT inhaler, Inhale 2 puffs into the lungs every 4 (four) hours as needed for wheezing or shortness of breath., Disp: 6.7 g, Rfl: 11   amLODipine (NORVASC) 10 MG tablet, Take 1 tablet (10 mg total) by mouth daily., Disp: 90 tablet, Rfl: 1   aspirin EC 81 MG tablet, Take 81 mg by mouth every evening., Disp: , Rfl:    budesonide-formoterol (SYMBICORT) 160-4.5 MCG/ACT inhaler, Inhale 2 puffs into the lungs 2 (two) times daily., Disp: 3 each, Rfl: 3   cetirizine (ZYRTEC) 10 MG tablet, Take 1 tablet (10 mg total) by mouth daily. (Patient taking differently: Take 10 mg by mouth at bedtime.), Disp: 90 tablet, Rfl: 3   Cholecalciferol (VITAMIN D3)  125 MCG (5000 UT) TABS, Take by mouth., Disp: , Rfl:    clobetasol (TEMOVATE) 0.05 % external solution, Apply 1 Application topically in the morning and at bedtime., Disp: , Rfl:    clopidogrel (PLAVIX) 75 MG tablet, Take 1  tablet (75 mg total) by mouth daily., Disp: 90 tablet, Rfl: 3   fluticasone (FLONASE) 50 MCG/ACT nasal spray, Place 2 sprays into both nostrils daily. (Patient taking differently: Place 2 sprays into both nostrils daily as needed (allergies.).), Disp: 16 g, Rfl: 6   ketoconazole (NIZORAL) 2 % shampoo, Apply topically., Disp: , Rfl:    levothyroxine (SYNTHROID) 75 MCG tablet, Take 1 tablet (75 mcg total) by mouth daily before breakfast., Disp: 90 tablet, Rfl: 1   meclizine (ANTIVERT) 25 MG tablet, Take 0.5 tablets (12.5 mg total) by mouth 3 (three) times daily as needed for dizziness or nausea., Disp: 30 tablet, Rfl: 1   methocarbamol (ROBAXIN) 500 MG tablet, Take 1 tablet (500 mg total) by mouth at bedtime as needed for muscle spasms., Disp: 90 tablet, Rfl: 1   montelukast (SINGULAIR) 10 MG tablet, Take 1 tablet (10 mg total) by mouth at bedtime., Disp: 90 tablet, Rfl: 1   Multiple Vitamin (MULTIVITAMIN WITH MINERALS) TABS tablet, Take 1 tablet by mouth daily with lunch. One A Day for Women, Disp: , Rfl:    omeprazole (PRILOSEC) 40 MG capsule, Take 1 capsule (40 mg total) by mouth 2 (two) times daily., Disp: 180 capsule, Rfl: 1   rosuvastatin (CRESTOR) 20 MG tablet, Take 1 tablet (20 mg total) by mouth daily., Disp: 90 tablet, Rfl: 1   sodium bicarbonate 650 MG tablet, Take 1,300 mg by mouth 2 (two) times daily., Disp: , Rfl:    triamcinolone cream (KENALOG) 0.1 %, Apply 1 Application topically 2 (two) times daily as needed (psoriasis (elbows))., Disp: , Rfl:    TURMERIC PO, Take 1 capsule by mouth daily with lunch. Qunol Joint Comfort With Turmeric Capsules, Disp: , Rfl:    valsartan (DIOVAN) 320 MG tablet, TAKE 1 TABLET BY MOUTH EVERY DAY, Disp: 90 tablet, Rfl: 0   zinc sulfate 220 (50 Zn) MG capsule, Take 220 mg by mouth daily with lunch., Disp: , Rfl:    Albuterol-Budesonide (AIRSUPRA) 90-80 MCG/ACT AERO, Inhale 2 puffs into the lungs every 6 (six) hours as needed. (Patient not taking: Reported on  08/09/2023), Disp: 5.9 g, Rfl: 2   hydrochlorothiazide (HYDRODIURIL) 25 MG tablet, Take 1 tablet (25 mg total) by mouth daily. (Patient not taking: Reported on 06/22/2023), Disp: 90 tablet, Rfl: 3   spironolactone (ALDACTONE) 25 MG tablet, Take 1 tablet (25 mg total) by mouth daily., Disp: 90 tablet, Rfl: 3   Subjective:   PATIENT ID: Andrea Hart GENDER: female DOB: 05-15-1936, MRN: [id number]  Chief Complaint  Patient presents with   pulmonary consult    SOB with exertion.     HPI  The patient is a pleasant 87 year old female presenting to clinic for the evaluation of dyspnea on exertion.  Patient is reporting symptoms of exertional dyspnea this been ongoing for over a year without any other associated symptoms.  She does have a history of asthma for which she is maintained on a long-acting inhaler (Symbicort) however the symptoms feel different from her asthma. The asthma has been very well controlled without any worsening or exacerbation. The dyspnea is worsened by walking and improved by rest. Shortness of breath is present walking on flat surfaces as well as going up stairs or inclination.  She denies any chest pain at this moment, wheezing, cough, sputum production, hemoptysis, fevers, chills, night sweats, or weight changes.  Patient does have a history of coronary artery disease and has underwent left heart cath with LAD drug-eluting stent deployed to the RCA.  She is followed closely by cardiology and is maintained on aspirin and clopidogrel. She is on optimal medical therapy and an attempt at holding beta blockers to assess for chronotropic insufficiency failed to improve symptoms. She did have similar shortness of breath around a year ago at which point she underwent LHC and was found to have 95% stenosis in the proximal/mid RCA (where she'd previously had a stent). A new DES was deployed to the RCA during said LHC. Given continued shortness of breath and fatigue, a repeat echocardiogram  was performed showing normal ejection fraction of 60%, no regional wall motion abnormalities, moderate LVH with grade 1 diastolic dysfunction and normal RV function.  Further cardiac workup included a Lexi scan in July 2024 which showed no significant ischemia suggesting a low risk scan. Patient is referred to pulmonology for further workup of dyspnea prior to consideration of repeat LHC and further cardiac workup.  Besides CAD (s/p stenting on aspirin and clopidogrel), HFpEF, and Asthma (on Symbicort), she has a history of carotid artery stenosis for which she underwent left TCAR earlier in 2024. Patient also reports a history of a hiatal hernia for which she underwent surgery (Nissen fundoplication) many years ago. Patient and daughter report that the course was complicated but aren't able to elaborate further. I don't have access to records regarding this surgery. Patient has also had a hip replacement and bilateral knee replacements. Patient was last seen in our clinic in 2019 (by Dr. Nicholos Johns) for asthma and reflux.  She denies any history of smoking. 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 Patient reports that neither parents nor any partners were smokers. She denies any family history of auto-immune disease, and does not have any family history of lung disease or fibrosis. Most family history is cardiac.  Ancillary information including prior medications, full medical/surgical/family/social histories, and PFTs (when available) are listed below and have been reviewed.   Review of Systems  Constitutional:  Negative for chills, fever and weight loss (reports weight gain).  Respiratory:  Positive for shortness of breath. Negative for cough, hemoptysis, sputum production and wheezing.   Cardiovascular:  Negative for chest pain, palpitations, orthopnea, leg swelling and PND.     Objective:   Vitals:   08/09/23 1520  BP: 118/60  Pulse: 76   Temp: 97.8 F (36.6 C)  TempSrc: Temporal  SpO2: 96%  Weight: 143 lb 3.2 oz (65 kg)  Height: 5' (1.524 m)   96% on RA BMI Readings from Last 3 Encounters:  08/09/23 27.97 kg/m  08/03/23 27.69 kg/m  06/22/23 27.63 kg/m   Wt Readings from Last 3 Encounters:  08/09/23 143 lb 3.2 oz (65 kg)  08/03/23 141 lb 12.8 oz (64.3 kg)  06/22/23 141 lb 8 oz (64.2 kg)    Physical Exam Constitutional:      Appearance: Normal appearance. She is not ill-appearing.  HENT:     Head: Normocephalic.     Mouth/Throat:     Mouth: Mucous membranes are moist.  Cardiovascular:     Rate and Rhythm: Normal rate and regular rhythm.     Pulses: Normal pulses.     Heart sounds: Normal heart sounds.  Pulmonary:  Effort: Pulmonary effort is normal. No respiratory distress.     Breath sounds: Normal breath sounds. No wheezing or rales.  Abdominal:     Palpations: Abdomen is soft.  Neurological:     Mental Status: She is alert.       Ancillary Information    Past Medical History:  Diagnosis Date   Abdominal hernia    Arthritis    Asthma    Carotid arterial disease (HCC)    a. 11/2020 Carotid U/S: Less than 50% RICA, 50-69% LICA.   CKD (chronic kidney disease), stage III (HCC)    Coronary artery disease    a. 2013 s/p PCI and stent placement in [facility] done for stable angina.   GI bleeding 02/02/2021   History of blood transfusion    History of hiatal hernia    had  surgery   Hyperlipidemia    Hypertension    Hypothyroidism    Pneumonia    years ago   PVC's (premature ventricular contractions)    a. 01/2020 Zio: RSR, 70, frequent PVCs with 10.5% burden.   Stroke [facility])    a. 2017 - ILR did not show afib.     Family History  Problem Relation Age of Onset   Heart Problems Mother    Heart attack Mother    Heart Problems Father    Heart attack Brother    Heart Problems Brother    Colon cancer Neg Hx    Breast cancer Neg Hx      Past Surgical History:   Procedure Laterality Date   ABDOMINAL HYSTERECTOMY     APPENDECTOMY     CATARACT EXTRACTION Bilateral    CHOLECYSTECTOMY     COLONOSCOPY WITH PROPOFOL N/A 04/06/2021   Procedure: COLONOSCOPY WITH PROPOFOL;  Surgeon: Toledo, Boykin Nearing, MD;  Location: [facility] ENDOSCOPY;  Service: Gastroenterology;  Laterality: N/A;   CORONARY ANGIOPLASTY  2013   1xStent [facility] [place].   CORONARY STENT INTERVENTION N/A 05/24/2022   Procedure: CORONARY STENT INTERVENTION;  Surgeon: Iran Ouch, MD;  Location: [facility] INVASIVE CV LAB;  Service: Cardiovascular;  Laterality: N/A;   EYE SURGERY     HAND SURGERY Right    LEFT HEART CATH AND CORONARY ANGIOGRAPHY Left 05/24/2022   Procedure: LEFT HEART CATH AND CORONARY ANGIOGRAPHY;  Surgeon: Iran Ouch, MD;  Location: [facility] INVASIVE CV LAB;  Service: Cardiovascular;  Laterality: Left;   LOOP RECORDER IMPLANT     NISSEN FUNDOPLICATION  2016   REPLACEMENT TOTAL KNEE Bilateral 2017   TOTAL HIP ARTHROPLASTY Right    TOTAL VAGINAL HYSTERECTOMY     TRANSCAROTID ARTERY REVASCULARIZATION  Left 04/18/2023   Procedure: LEFT Transcarotid Artery Revascularization;  Surgeon: Nada Libman, MD;  Location: [facility] OR;  Service: Vascular;  Laterality: Left;   ULTRASOUND GUIDANCE FOR VASCULAR ACCESS  04/18/2023   Procedure: ULTRASOUND GUIDANCE FOR VASCULAR ACCESS;  Surgeon: Nada Libman, MD;  Location: [facility] OR;  Service: Vascular;;    Social History   Socioeconomic History   Marital status: Widowed    Spouse name: Not on file   Number of children: 2   Years of education: Not on file   Highest education level: Some college, no degree  Occupational History    Comment: retired Photographer  Tobacco Use   Smoking status: Never   Smokeless tobacco: Never  Vaping Use   Vaping status: Never Used  Substance and Sexual Activity   Alcohol use: Not Currently    Comment: Occasional  Drug use: Never   Sexual activity: Never    Birth control/protection: Surgical  Other  Topics Concern   Not on file  Social History Narrative   Moved here in 2019, had been living in [place].    Born here, raised daughters here.    Lives with daughter now, [place].   Lost one daughter in car wreck.    Social Determinants of Health   Financial Resource Strain: Low Risk  (11/29/2021)   Overall Financial Resource Strain (CARDIA)    Difficulty of Paying Living Expenses: Not hard at all  Food Insecurity: No Food Insecurity (04/20/2023)   Hunger Vital Sign    Worried About Running Out of Food in the Last Year: Never true    Ran Out of Food in the Last Year: Never true  Transportation Needs: No Transportation Needs (04/20/2023)   PRAPARE - Administrator, Civil Service (Medical): No    Lack of Transportation (Non-Medical): No  Physical Activity: Insufficiently Active (11/29/2021)   Exercise Vital Sign    Days of Exercise per Week: 2 days    Minutes of Exercise per Session: 20 min  Stress: No Stress Concern Present (11/29/2021)   [employer] of Occupational Health - Occupational Stress Questionnaire    Feeling of Stress : Not at all  Social Connections: Moderately Integrated (11/29/2021)   Social Connection and Isolation Panel [NHANES]    Frequency of Communication with Friends and Family: Three times a week    Frequency of Social Gatherings with Friends and Family: Twice a week    Attends Religious Services: More than 4 times per year    Active Member of [employer] or Organizations: Yes    Attends Banker Meetings: More than 4 times per year    Marital Status: Widowed  Intimate Partner Violence: Not At Risk (11/29/2021)   Humiliation, Afraid, Rape, and Kick questionnaire    Fear of Current or Ex-Partner: No    Emotionally Abused: No    Physically Abused: No    Sexually Abused: No     No Known Allergies   CBC    Component Value Date/Time   WBC 8.4 08/03/2023 1056   WBC 13.7 (H) 04/19/2023 0437   RBC 3.84 08/03/2023 1056   RBC 3.04  (L) 04/19/2023 0437   HGB 11.2 08/03/2023 1056   HCT 34.5 08/03/2023 1056   PLT 279 08/03/2023 1056   MCV 90 08/03/2023 1056   MCH 29.2 08/03/2023 1056   MCH 30.3 04/19/2023 0437   MCHC 32.5 08/03/2023 1056   MCHC 32.9 04/19/2023 0437   RDW 11.9 08/03/2023 1056   LYMPHSABS 1.3 06/15/2023 0909   MONOABS 0.6 02/02/2021 0308   EOSABS 0.5 (H) 06/15/2023 0909   BASOSABS 0.1 06/15/2023 0909    Pulmonary Functions Testing Results:     No data to display          Outpatient Medications Prior to Visit  Medication Sig Dispense Refill   acetaminophen (TYLENOL) 500 MG tablet Take 1,000 mg by mouth at bedtime.     albuterol (VENTOLIN HFA) 108 (90 Base) MCG/ACT inhaler Inhale 2 puffs into the lungs every 4 (four) hours as needed for wheezing or shortness of breath. 6.7 g 11   amLODipine (NORVASC) 10 MG tablet Take 1 tablet (10 mg total) by mouth daily. 90 tablet 1   aspirin EC 81 MG tablet Take 81 mg by mouth every evening.     budesonide-formoterol (SYMBICORT) 160-4.5 MCG/ACT inhaler Inhale 2 puffs  into the lungs 2 (two) times daily. 3 each 3   cetirizine (ZYRTEC) 10 MG tablet Take 1 tablet (10 mg total) by mouth daily. (Patient taking differently: Take 10 mg by mouth at bedtime.) 90 tablet 3   Cholecalciferol (VITAMIN D3) 125 MCG (5000 UT) TABS Take by mouth.     clobetasol (TEMOVATE) 0.05 % external solution Apply 1 Application topically in the morning and at bedtime.     clopidogrel (PLAVIX) 75 MG tablet Take 1 tablet (75 mg total) by mouth daily. 90 tablet 3   fluticasone (FLONASE) 50 MCG/ACT nasal spray Place 2 sprays into both nostrils daily. (Patient taking differently: Place 2 sprays into both nostrils daily as needed (allergies.).) 16 g 6   ketoconazole (NIZORAL) 2 % shampoo Apply topically.     levothyroxine (SYNTHROID) 75 MCG tablet Take 1 tablet (75 mcg total) by mouth daily before breakfast. 90 tablet 1   meclizine (ANTIVERT) 25 MG tablet Take 0.5 tablets (12.5 mg total) by  mouth 3 (three) times daily as needed for dizziness or nausea. 30 tablet 1   methocarbamol (ROBAXIN) 500 MG tablet Take 1 tablet (500 mg total) by mouth at bedtime as needed for muscle spasms. 90 tablet 1   montelukast (SINGULAIR) 10 MG tablet Take 1 tablet (10 mg total) by mouth at bedtime. 90 tablet 1   Multiple Vitamin (MULTIVITAMIN WITH MINERALS) TABS tablet Take 1 tablet by mouth daily with lunch. One A Day for Women     omeprazole (PRILOSEC) 40 MG capsule Take 1 capsule (40 mg total) by mouth 2 (two) times daily. 180 capsule 1   rosuvastatin (CRESTOR) 20 MG tablet Take 1 tablet (20 mg total) by mouth daily. 90 tablet 1   sodium bicarbonate 650 MG tablet Take 1,300 mg by mouth 2 (two) times daily.     triamcinolone cream (KENALOG) 0.1 % Apply 1 Application topically 2 (two) times daily as needed (psoriasis (elbows)).     TURMERIC PO Take 1 capsule by mouth daily with lunch. Qunol Joint Comfort With Turmeric Capsules     valsartan (DIOVAN) 320 MG tablet TAKE 1 TABLET BY MOUTH EVERY DAY 90 tablet 0   zinc sulfate 220 (50 Zn) MG capsule Take 220 mg by mouth daily with lunch.     Albuterol-Budesonide (AIRSUPRA) 90-80 MCG/ACT AERO Inhale 2 puffs into the lungs every 6 (six) hours as needed. (Patient not taking: Reported on 08/09/2023) 5.9 g 2   hydrochlorothiazide (HYDRODIURIL) 25 MG tablet Take 1 tablet (25 mg total) by mouth daily. (Patient not taking: Reported on 06/22/2023) 90 tablet 3   spironolactone (ALDACTONE) 25 MG tablet Take 1 tablet (25 mg total) by mouth daily. 90 tablet 3   No facility-administered medications prior to visit.

## 2023-08-13 ENCOUNTER — Ambulatory Visit
Admission: RE | Admit: 2023-08-13 | Discharge: 2023-08-13 | Disposition: A | Discharge status: Home / Self Care | Payer: Medicare Other | Source: Ambulatory Visit | Attending: Student in an Organized Health Care Education/Training Program | Admitting: Student in an Organized Health Care Education/Training Program

## 2023-08-13 DIAGNOSIS — K224 Dyskinesia of esophagus: Secondary | ICD-10-CM | POA: Diagnosis not present

## 2023-08-13 DIAGNOSIS — R0602 Shortness of breath: Secondary | ICD-10-CM | POA: Diagnosis not present

## 2023-08-13 DIAGNOSIS — R0609 Other forms of dyspnea: Secondary | ICD-10-CM | POA: Insufficient documentation

## 2023-08-24 DIAGNOSIS — Z79899 Other long term (current) drug therapy: Secondary | ICD-10-CM | POA: Diagnosis not present

## 2023-08-24 DIAGNOSIS — R7989 Other specified abnormal findings of blood chemistry: Secondary | ICD-10-CM | POA: Diagnosis not present

## 2023-08-25 LAB — T4, FREE: Free T4: 1.41 ng/dL (ref 0.82–1.77)

## 2023-08-29 LAB — T4, FREE: Free T4: 1.52 ng/dL (ref 0.82–1.77)

## 2023-08-29 LAB — SPECIMEN STATUS REPORT

## 2023-09-06 ENCOUNTER — Ambulatory Visit: Payer: Medicare Other

## 2023-09-10 ENCOUNTER — Ambulatory Visit (INDEPENDENT_AMBULATORY_CARE_PROVIDER_SITE_OTHER): Payer: Medicare Other | Admitting: Family Medicine

## 2023-09-10 ENCOUNTER — Encounter: Payer: Self-pay | Admitting: Family Medicine

## 2023-09-10 VITALS — BP 138/76 | HR 68 | Ht 60.0 in | Wt 142.8 lb

## 2023-09-10 DIAGNOSIS — N1831 Chronic kidney disease, stage 3a: Secondary | ICD-10-CM | POA: Diagnosis not present

## 2023-09-10 DIAGNOSIS — E039 Hypothyroidism, unspecified: Secondary | ICD-10-CM | POA: Diagnosis not present

## 2023-09-10 DIAGNOSIS — R252 Cramp and spasm: Secondary | ICD-10-CM | POA: Diagnosis not present

## 2023-09-10 DIAGNOSIS — G8929 Other chronic pain: Secondary | ICD-10-CM

## 2023-09-10 DIAGNOSIS — M159 Polyosteoarthritis, unspecified: Secondary | ICD-10-CM | POA: Diagnosis not present

## 2023-09-10 DIAGNOSIS — I10 Essential (primary) hypertension: Secondary | ICD-10-CM | POA: Diagnosis not present

## 2023-09-10 DIAGNOSIS — M25512 Pain in left shoulder: Secondary | ICD-10-CM

## 2023-09-10 DIAGNOSIS — R251 Tremor, unspecified: Secondary | ICD-10-CM

## 2023-09-10 NOTE — Progress Notes (Signed)
Acute Office Visit  Subjective:     Patient ID: Andrea Hart, female    DOB: [birth year], 87 y.o.   MRN: [id number]  No chief complaint on file.   HPI Discussed the use of AI scribe software for clinical note transcription with the patient, who gave verbal consent to proceed.  History of Present Illness   The patient, with a past medical history of hypertension and hyperlipidemia, presents with worsening leg cramps that have been occurring for the past three weeks. The cramps are described as severe, occurring every hour, particularly at night, and are associated with her big toe sticking straight up. The patient denies any pattern to the cramps and reports that they occur regardless of her activity level during the day. She also reports experiencing charley horses occasionally.  In addition to the leg cramps, the patient has been experiencing tremors, particularly in her hands, which have been affecting her ability to write. She describes feeling shaky, particularly in the mornings, but denies feeling anxious or stressed. The tremors have been present for about six months but have worsened in the past three weeks.  She also reports L acute on chronic shoulder pain, which has worsened after an incident where she rolled over her arm while sleeping. The patient expresses interest in having her shoulder operated on due to the severity of the pain.  The patient's daughter mentions that the patient has been taken off hydrochlorothiazide and rosuvastatin recently due to side effects. She is scheduled for a pulmonary breathing test the following day.       ROS per HPI      Objective:    BP 138/76 (BP Location: Right Arm, Patient Position: Sitting, Cuff Size: Normal)   Pulse 68   Ht 5' (1.524 m)   Wt 142 lb 12.8 oz (64.8 kg)   BMI 27.89 kg/m    Physical Exam Vitals reviewed.  Constitutional:      General: She is not in acute distress.    Appearance: Normal appearance. She is  well-developed. She is not diaphoretic.  HENT:     Head: Normocephalic and atraumatic.  Eyes:     General: No scleral icterus.    Conjunctiva/sclera: Conjunctivae normal.  Neck:     Thyroid: No thyromegaly.  Cardiovascular:     Rate and Rhythm: Normal rate and regular rhythm.     Heart sounds: Normal heart sounds.  Pulmonary:     Effort: Pulmonary effort is normal. No respiratory distress.     Breath sounds: Normal breath sounds. No wheezing, rhonchi or rales.  Musculoskeletal:        General: No swelling or tenderness.     Cervical back: Neck supple.     Right lower leg: No edema.     Left lower leg: No edema.  Lymphadenopathy:     Cervical: No cervical adenopathy.  Skin:    General: Skin is warm and dry.     Findings: No rash.  Neurological:     Mental Status: She is alert and oriented to person, place, and time. Mental status is at baseline.     Motor: Tremor present.  Psychiatric:        Mood and Affect: Mood normal.        Behavior: Behavior normal.     No results found for any visits on 09/10/23.      Assessment & Plan:   Problem List Items Addressed This Visit       Cardiovascular and Mediastinum  Essential hypertension   Relevant Orders   Comprehensive metabolic panel   Magnesium     Endocrine   Hypothyroidism   Relevant Orders   TSH     Musculoskeletal and Integument   Osteoarthritis     Genitourinary   CKD (chronic kidney disease), stage III (HCC)   Relevant Orders   TSH   Comprehensive metabolic panel   Magnesium   Other Visit Diagnoses     Leg cramp    -  Primary   Relevant Orders   TSH   Comprehensive metabolic panel   Magnesium   Tremor       Chronic left shoulder pain       Relevant Orders   Ambulatory referral to Orthopedic Surgery          Nocturnal Leg Cramps Severe, frequent leg cramps disrupting sleep. No clear triggers identified. -Check electrolytes, including potassium, sodium, and magnesium, to rule out  imbalance as a cause. - treatment pending results  Essential Tremor Increased tremor, particularly in hands, affecting handwriting and causing feelings of shakiness. No resting tremor noted. -Consider treatment options such as propranolol or pramipexole if tremor continues to worsen or significantly impacts quality of life- declines currently. -Consider referral to occupational therapy for strategies to manage tremor.  Arthritis Chronic issue, with recent exacerbation in shoulder after injury. -Consider referral to orthopedic specialist for evaluation of shoulder and potential surgical intervention.        No orders of the defined types were placed in this encounter.   Return if symptoms worsen or fail to improve.  Shirlee Latch, MD

## 2023-09-11 ENCOUNTER — Encounter: Payer: Self-pay | Admitting: Family Medicine

## 2023-09-11 ENCOUNTER — Ambulatory Visit: Payer: Medicare Other | Attending: Student in an Organized Health Care Education/Training Program

## 2023-09-11 DIAGNOSIS — R0609 Other forms of dyspnea: Secondary | ICD-10-CM | POA: Insufficient documentation

## 2023-09-11 DIAGNOSIS — E039 Hypothyroidism, unspecified: Secondary | ICD-10-CM

## 2023-09-11 LAB — PULMONARY FUNCTION TEST ARMC ONLY
DL/VA % pred: 92 %
DL/VA: 3.85 ml/min/mmHg/L
DLCO unc % pred: 73 %
DLCO unc: 11.85 ml/min/mmHg
FEF 25-75 Post: 1.2 L/s
FEF 25-75 Pre: 1.1 L/s
FEF2575-%Change-Post: 9 %
FEF2575-%Pred-Post: 129 %
FEF2575-%Pred-Pre: 119 %
FEV1-%Change-Post: 4 %
FEV1-%Pred-Post: 94 %
FEV1-%Pred-Pre: 90 %
FEV1-Post: 1.32 L
FEV1-Pre: 1.26 L
FEV1FVC-%Change-Post: 6 %
FEV1FVC-%Pred-Pre: 102 %
FEV6-%Change-Post: -2 %
FEV6-%Pred-Post: 92 %
FEV6-%Pred-Pre: 94 %
FEV6-Post: 1.65 L
FEV6-Pre: 1.68 L
FEV6FVC-%Pred-Post: 107 %
FEV6FVC-%Pred-Pre: 107 %
FVC-%Change-Post: -2 %
FVC-%Pred-Post: 86 %
FVC-%Pred-Pre: 87 %
FVC-Post: 1.65 L
FVC-Pre: 1.68 L
Post FEV1/FVC ratio: 80 %
Post FEV6/FVC ratio: 100 %
Pre FEV1/FVC ratio: 75 %
Pre FEV6/FVC Ratio: 100 %
RV % pred: 84 %
RV: 1.94 L
TLC % pred: 83 %
TLC: 3.71 L

## 2023-09-11 LAB — COMPREHENSIVE METABOLIC PANEL
ALT: 20 [IU]/L (ref 0–32)
AST: 28 [IU]/L (ref 0–40)
Albumin: 4.2 g/dL (ref 3.7–4.7)
Alkaline Phosphatase: 63 [IU]/L (ref 44–121)
BUN/Creatinine Ratio: 14 (ref 12–28)
BUN: 19 mg/dL (ref 8–27)
Bilirubin Total: 0.2 mg/dL (ref 0.0–1.2)
CO2: 23 mmol/L (ref 20–29)
Calcium: 9.2 mg/dL (ref 8.7–10.3)
Chloride: 103 mmol/L (ref 96–106)
Creatinine, Ser: 1.36 mg/dL — ABNORMAL HIGH (ref 0.57–1.00)
Globulin, Total: 2.3 g/dL (ref 1.5–4.5)
Glucose: 111 mg/dL — ABNORMAL HIGH (ref 70–99)
Potassium: 4.1 mmol/L (ref 3.5–5.2)
Sodium: 140 mmol/L (ref 134–144)
Total Protein: 6.5 g/dL (ref 6.0–8.5)
eGFR: 38 mL/min/{1.73_m2} — ABNORMAL LOW (ref >59)

## 2023-09-11 LAB — TSH: TSH: 6.29 u[IU]/mL — ABNORMAL HIGH (ref 0.450–4.500)

## 2023-09-11 LAB — MAGNESIUM: Magnesium: 1.7 mg/dL (ref 1.6–2.3)

## 2023-09-11 MED ORDER — ALBUTEROL SULFATE (2.5 MG/3ML) 0.083% IN NEBU
2.5000 mg | INHALATION_SOLUTION | Freq: Once | RESPIRATORY_TRACT | Status: AC
  Filled 2023-09-11: qty 3

## 2023-09-13 ENCOUNTER — Ambulatory Visit: Payer: Self-pay | Admitting: *Deleted

## 2023-09-13 DIAGNOSIS — E039 Hypothyroidism, unspecified: Secondary | ICD-10-CM

## 2023-09-13 MED ORDER — LEVOTHYROXINE SODIUM 88 MCG PO TABS
88.0000 ug | ORAL_TABLET | Freq: Every day | ORAL | 0 refills | Status: DC
Start: 2023-09-13 — End: 2023-09-13

## 2023-09-13 MED ORDER — LEVOTHYROXINE SODIUM 88 MCG PO TABS
88.0000 ug | ORAL_TABLET | Freq: Every day | ORAL | 1 refills | Status: DC
Start: 2023-09-13 — End: 2023-09-14

## 2023-09-13 NOTE — Telephone Encounter (Signed)
Please refill the 88 mcg dose and have her take the 88 and 75 every other day alternating.

## 2023-09-13 NOTE — Telephone Encounter (Signed)
Disregard last message. Send in synthroid 88 mcg daily. No alternating of doses. Order TSH for recheck in 2 months.

## 2023-09-13 NOTE — Telephone Encounter (Signed)
  Chief Complaint: requesting if new dose of levothyroxine 88 mcg has been sent to pharmacy Symptoms: na Frequency: na Pertinent Negatives: Patient denies na Disposition: [] ED /[] Urgent Care (no appt availability in office) / [] Appointment(In office/virtual)/ []  [place] [facility]/ [] Home Care/ [] Refused Recommended Disposition /[] [facility]/ [x]  Follow up with PCP Normal/stable labs, except TSH is also elevated slightly suggesting too little Synthroid.  If not taking it regularly, please resume. If you are taking it regularly, recommend increasing the dose to 88 mcg daily (CMAs ok to send in #90 r1 if patient agrees). Repeat TSH in 2 months. Electrolytes are normal. Stretch calves and feet before bed and consider tonic water at bedtime for leg cramps. Improving thyroid levels may help as well.   Additional Notes:   Patient reports she has had daughter to send mychart message regarding resent results.  Patient reports she has been taking 75 mcg  every am at 6 prior to eating . Please send new Rx 88 mcg to pharmacy.      Reason for Disposition  Prescription request for new medicine (not a refill)  Answer Assessment - Initial Assessment Questions 1. NAME of MEDICINE: "What medicine(s) are you calling about?"     Levothyroxine 2. QUESTION: "What is your question?" (e.g., double dose of medicine, side effect)     Has medication been sent to pharmacy? Sent My chart message this am  3. PRESCRIBER: "Who prescribed the medicine?" Reason: if prescribed by specialist, call should be referred to that group.     PCP 4. SYMPTOMS: "Do you have any symptoms?" If Yes, ask: "What symptoms are you having?"  "How bad are the symptoms (e.g., mild, moderate, severe)     N a 5. PREGNANCY:  "Is there any chance that you are pregnant?" "When was your last menstrual period?"     na  Protocols used: Medication Question Call-A-AH

## 2023-09-13 NOTE — Telephone Encounter (Signed)
Rx sent patient advised. Verbalized understanding

## 2023-09-13 NOTE — Addendum Note (Signed)
Addended by: Lily Kocher on: 09/13/2023 04:21 PM   Modules accepted: Orders

## 2023-09-13 NOTE — Addendum Note (Signed)
Addended by: Lily Kocher on: 09/13/2023 04:23 PM   Modules accepted: Orders

## 2023-09-14 MED ORDER — LEVOTHYROXINE SODIUM 88 MCG PO TABS
88.0000 ug | ORAL_TABLET | Freq: Every day | ORAL | 1 refills | Status: DC
Start: 2023-09-14 — End: 2023-12-13

## 2023-09-26 ENCOUNTER — Ambulatory Visit: Payer: Medicare Other | Admitting: Student in an Organized Health Care Education/Training Program

## 2023-09-27 ENCOUNTER — Other Ambulatory Visit
Admission: RE | Admit: 2023-09-27 | Discharge: 2023-09-27 | Disposition: A | Discharge status: Home / Self Care | Payer: Medicare Other | Attending: Student in an Organized Health Care Education/Training Program | Admitting: Student in an Organized Health Care Education/Training Program

## 2023-09-27 ENCOUNTER — Encounter: Payer: Self-pay | Admitting: Student in an Organized Health Care Education/Training Program

## 2023-09-27 ENCOUNTER — Ambulatory Visit (INDEPENDENT_AMBULATORY_CARE_PROVIDER_SITE_OTHER): Payer: Medicare Other | Admitting: Student in an Organized Health Care Education/Training Program

## 2023-09-27 VITALS — BP 136/76 | HR 75 | Temp 98.2°F | Ht 60.0 in | Wt 141.8 lb

## 2023-09-27 DIAGNOSIS — Z23 Encounter for immunization: Secondary | ICD-10-CM | POA: Diagnosis not present

## 2023-09-27 DIAGNOSIS — R0609 Other forms of dyspnea: Secondary | ICD-10-CM | POA: Diagnosis not present

## 2023-09-27 DIAGNOSIS — J4520 Mild intermittent asthma, uncomplicated: Secondary | ICD-10-CM | POA: Diagnosis not present

## 2023-09-27 DIAGNOSIS — I2510 Atherosclerotic heart disease of native coronary artery without angina pectoris: Secondary | ICD-10-CM | POA: Diagnosis not present

## 2023-09-27 LAB — CBC WITH DIFFERENTIAL/PLATELET
Abs Immature Granulocytes: 0.1 10*3/uL — ABNORMAL HIGH (ref 0.00–0.07)
Basophils Absolute: 0.1 10*3/uL (ref 0.0–0.1)
Basophils Relative: 1 %
Eosinophils Absolute: 0.3 10*3/uL (ref 0.0–0.5)
Eosinophils Relative: 4 %
HCT: 34.3 % — ABNORMAL LOW (ref 36.0–46.0)
Hemoglobin: 11.7 g/dL — ABNORMAL LOW (ref 12.0–15.0)
Immature Granulocytes: 1 %
Lymphocytes Relative: 18 %
Lymphs Abs: 1.4 10*3/uL (ref 0.7–4.0)
MCH: 30.5 pg (ref 26.0–34.0)
MCHC: 34.1 g/dL (ref 30.0–36.0)
MCV: 89.6 fL (ref 80.0–100.0)
Monocytes Absolute: 0.7 10*3/uL (ref 0.1–1.0)
Monocytes Relative: 9 %
Neutro Abs: 5.2 10*3/uL (ref 1.7–7.7)
Neutrophils Relative %: 67 %
Platelets: 270 10*3/uL (ref 150–400)
RBC: 3.83 MIL/uL — ABNORMAL LOW (ref 3.87–5.11)
RDW: 12.6 % (ref 11.5–15.5)
WBC: 7.8 10*3/uL (ref 4.0–10.5)
nRBC: 0 % (ref 0.0–0.2)

## 2023-09-27 NOTE — Progress Notes (Signed)
Assessment & Plan:   #DOE (dyspnea on exertion) #Asthma #CAD   The patient is presenting for the evaluation of shortness of breath with exertion in the setting of significant cardiac history as well as history of asthma. Her CAD is currently medically managed with beta blockers and anti-platelet agents (aspirin and clopidogrel). She underwent stenting of the RCA in 2023 and a recent lexiscan was low risk. Patient also has a history of asthma but she hasn't had asthma related symptoms in a long time and is compliant with Symbicort.   I have reviewed the patient's chart and imaging studies that have included a CT scan of the neck. This CT has included images from the upper chest and shows the upper lobes of both lungs to have normal parenchyma. Pulmonary function testing obtained showed normal spirometry without any obstruction and normal lung volumes. DLCO is very mildly reduced, which could be explained by anemia. I will repeat her CBC today given her previous blood counts had shown anemia. Finally, double contrast esophagogram did not show any hiatal hernia.  Workup is overall re-assuring  and doesn't show any pulmonary pathology to explain the patient's shortness of breath. No concern for obstructive lung disease, hiatal hernia, or ILD (given lack of restriction). The mild decrease in DLCO could suggest anemia (will check CBC), and the improvement in some of her symptoms with increase in levothyroxine does suggest a component of hypothyroidism. She is to follow up with cardiology next week to discuss possible repeat cardiac catheterization. Finally, I did offer Andrea Hart a cardiopulmonary exercise test to further workup the dyspnea but she doesn't feel she could complete such testing.  - CBC w/Diff; Future  #Need for influenza vaccination  -will administer influenza vaccine today  #Pre-operative risk assessment  Does have upcoming shoulder replacement, scores 6 points on [place] respiratory  failure index indicating low risk of post operative respiratory failure (0.5%). She is medically optimized from a pulmonary perspective   Return if symptoms worsen or fail to improve.  I spent 33 minutes caring for this patient today, including preparing to see the patient, obtaining a medical history , reviewing a separately obtained history, performing a medically appropriate examination and/or evaluation, counseling and educating the patient/family/caregiver, and documenting clinical information in the electronic health record  Andrea Chute, MD [facility] 09/27/2023 10:50 AM    End of visit medications:  No orders of the defined types were placed in this encounter.    Current Outpatient Medications:    acetaminophen (TYLENOL) 500 MG tablet, Take 1,000 mg by mouth at bedtime., Disp: , Rfl:    albuterol (VENTOLIN HFA) 108 (90 Base) MCG/ACT inhaler, Inhale 2 puffs into the lungs every 4 (four) hours as needed for wheezing or shortness of breath., Disp: 6.7 g, Rfl: 11   amLODipine (NORVASC) 10 MG tablet, Take 1 tablet (10 mg total) by mouth daily., Disp: 90 tablet, Rfl: 1   aspirin EC 81 MG tablet, Take 81 mg by mouth every evening., Disp: , Rfl:    budesonide-formoterol (SYMBICORT) 160-4.5 MCG/ACT inhaler, Inhale 2 puffs into the lungs 2 (two) times daily., Disp: 3 each, Rfl: 3   cetirizine (ZYRTEC) 10 MG tablet, Take 1 tablet (10 mg total) by mouth daily. (Patient taking differently: Take 10 mg by mouth at bedtime.), Disp: 90 tablet, Rfl: 3   Cholecalciferol (VITAMIN D3) 125 MCG (5000 UT) TABS, Take by mouth., Disp: , Rfl:    clobetasol (TEMOVATE) 0.05 % external solution, Apply 1  Application topically in the morning and at bedtime., Disp: , Rfl:    clopidogrel (PLAVIX) 75 MG tablet, Take 1 tablet (75 mg total) by mouth daily., Disp: 90 tablet, Rfl: 3   ketoconazole (NIZORAL) 2 % shampoo, Apply topically., Disp: , Rfl:    levothyroxine (SYNTHROID) 88 MCG tablet,  Take 1 tablet (88 mcg total) by mouth daily., Disp: 90 tablet, Rfl: 1   meclizine (ANTIVERT) 25 MG tablet, Take 0.5 tablets (12.5 mg total) by mouth 3 (three) times daily as needed for dizziness or nausea., Disp: 30 tablet, Rfl: 1   methocarbamol (ROBAXIN) 500 MG tablet, Take 1 tablet (500 mg total) by mouth at bedtime as needed for muscle spasms., Disp: 90 tablet, Rfl: 1   montelukast (SINGULAIR) 10 MG tablet, Take 1 tablet (10 mg total) by mouth at bedtime., Disp: 90 tablet, Rfl: 1   Multiple Vitamin (MULTIVITAMIN WITH MINERALS) TABS tablet, Take 1 tablet by mouth daily with lunch. One A Day for Women, Disp: , Rfl:    omeprazole (PRILOSEC) 40 MG capsule, Take 1 capsule (40 mg total) by mouth 2 (two) times daily., Disp: 180 capsule, Rfl: 1   sodium bicarbonate 650 MG tablet, Take 1,300 mg by mouth 2 (two) times daily., Disp: , Rfl:    spironolactone (ALDACTONE) 25 MG tablet, Take 1 tablet (25 mg total) by mouth daily., Disp: 90 tablet, Rfl: 3   triamcinolone cream (KENALOG) 0.1 %, Apply 1 Application topically 2 (two) times daily as needed (psoriasis (elbows))., Disp: , Rfl:    TURMERIC PO, Take 1 capsule by mouth daily with lunch. Qunol Joint Comfort With Turmeric Capsules, Disp: , Rfl:    valsartan (DIOVAN) 320 MG tablet, TAKE 1 TABLET BY MOUTH EVERY DAY, Disp: 90 tablet, Rfl: 0   zinc sulfate 220 (50 Zn) MG capsule, Take 220 mg by mouth daily with lunch., Disp: , Rfl:  No current facility-administered medications for this visit.  Facility-Administered Medications Ordered in Other Visits:    albuterol (PROVENTIL) (2.5 MG/3ML) 0.083% nebulizer solution 2.5 mg, 2.5 mg, Nebulization, Once, Andrea Chute, MD   Subjective:   PATIENT ID: Andrea Hart GENDER: female DOB: 10-16-1936, MRN: [id number]  Chief Complaint  Patient presents with   Follow-up    Shortness of breath on exertion. No cough or wheezing.     HPI  The patient is a pleasant 87 year old female presenting to clinic for follow  up. I had seen her in September for the evaluation of exertional dyspnea. She is presenting to discuss her results.  Symptoms are unchanged, she continues to experience exertional dyspnea that has been ongoing for over a year. She has been followed by her PCP and levothyroxine uptitrated given increase in TSH. She feels less fatigued with this change. No cough, no chest pain, and no chest tightness. She does have a history of asthma for which she is maintained on a long-acting inhaler (Symbicort) however the symptoms feel different from her asthma. The asthma has been very well controlled. The dyspnea is worsened by walking and improved by rest. Shortness of breath is present walking on flat surfaces as well as going up stairs or inclination.   Patient does have a history of coronary artery disease and has underwent left heart cath with LAD drug-eluting stent deployed to the RCA.  She is followed closely by cardiology and is maintained on aspirin and clopidogrel. She is on optimal medical therapy and an attempt at holding beta blockers to assess for chronotropic insufficiency failed to improve  symptoms. She did have similar shortness of breath around a year ago at which point she underwent LHC and was found to have 95% stenosis in the proximal/mid RCA (where she'd previously had a stent). A new DES was deployed to the RCA during said LHC. Given continued shortness of breath and fatigue, a repeat echocardiogram was performed showing normal ejection fraction of 60%, no regional wall motion abnormalities, moderate LVH with grade 1 diastolic dysfunction and normal RV function.  Further cardiac workup included a Lexi scan in July 2024 which showed no significant ischemia suggesting a low risk scan.    Besides CAD (s/p stenting on aspirin and clopidogrel), HFpEF, and Asthma (on Symbicort), she has a history of carotid artery stenosis for which she underwent left TCAR earlier in 2024. Patient also reports a history  of a hiatal hernia for which she underwent surgery (Nissen fundoplication) many years ago. I don't have access to records regarding this surgery. Patient has also had a hip replacement and bilateral knee replacements. Patient was last seen in our clinic in 2019 (by Dr. Nicholos Johns) for asthma and reflux.   She denies any history of smoking. 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 Patient reports that neither parents nor any partners were smokers. She denies any family history of auto-immune disease, and does not have any family history of lung disease or fibrosis. Most family history is cardiac.  Ancillary information including prior medications, full medical/surgical/family/social histories, and PFTs (when available) are listed below and have been reviewed.   Review of Systems  Constitutional:  Negative for chills and fever.  Respiratory:  Positive for shortness of breath (with exertion). Negative for cough and sputum production.   Cardiovascular:  Negative for chest pain.     Objective:   Vitals:   09/27/23 1023  BP: 136/76  Pulse: 75  Temp: 98.2 F (36.8 C)  TempSrc: Temporal  SpO2: 98%  Weight: 141 lb 12.8 oz (64.3 kg)  Height: 5' (1.524 m)   98% on RA BMI Readings from Last 3 Encounters:  09/27/23 27.69 kg/m  09/10/23 27.89 kg/m  08/09/23 27.97 kg/m   Wt Readings from Last 3 Encounters:  09/27/23 141 lb 12.8 oz (64.3 kg)  09/10/23 142 lb 12.8 oz (64.8 kg)  08/09/23 143 lb 3.2 oz (65 kg)    Physical Exam Constitutional:      General: She is not in acute distress.    Appearance: Normal appearance.  HENT:     Head: Normocephalic.     Mouth/Throat:     Mouth: Mucous membranes are moist.  Cardiovascular:     Rate and Rhythm: Normal rate and regular rhythm.     Pulses: Normal pulses.     Heart sounds: Normal heart sounds.  Pulmonary:     Effort: Pulmonary effort is normal.     Breath sounds: Normal breath  sounds. No wheezing, rhonchi or rales.  Neurological:     General: No focal deficit present.     Mental Status: She is alert and oriented to person, place, and time. Mental status is at baseline.       Ancillary Information    Past Medical History:  Diagnosis Date   Abdominal hernia    Arthritis    Asthma    Carotid arterial disease (HCC)    a. 11/2020 Carotid U/S: Less than 50% RICA, 50-69% LICA.   CKD (chronic kidney disease), stage III (HCC)    Coronary  artery disease    a. 2013 s/p PCI and stent placement in [facility] done for stable angina.   GI bleeding 02/02/2021   History of blood transfusion    History of hiatal hernia    had  surgery   Hyperlipidemia    Hypertension    Hypothyroidism    Pneumonia    years ago   PVC's (premature ventricular contractions)    a. 01/2020 Zio: RSR, 70, frequent PVCs with 10.5% burden.   Stroke [facility])    a. 2017 - ILR did not show afib.     Family History  Problem Relation Age of Onset   Heart Problems Mother    Heart attack Mother    Heart Problems Father    Heart attack Brother    Heart Problems Brother    Colon cancer Neg Hx    Breast cancer Neg Hx      Past Surgical History:  Procedure Laterality Date   ABDOMINAL HYSTERECTOMY     APPENDECTOMY     CATARACT EXTRACTION Bilateral    CHOLECYSTECTOMY     COLONOSCOPY WITH PROPOFOL N/A 04/06/2021   Procedure: COLONOSCOPY WITH PROPOFOL;  Surgeon: Toledo, Boykin Nearing, MD;  Location: [facility] ENDOSCOPY;  Service: Gastroenterology;  Laterality: N/A;   CORONARY ANGIOPLASTY  2013   1xStent [facility] [place].   CORONARY STENT INTERVENTION N/A 05/24/2022   Procedure: CORONARY STENT INTERVENTION;  Surgeon: Iran Ouch, MD;  Location: [facility] INVASIVE CV LAB;  Service: Cardiovascular;  Laterality: N/A;   EYE SURGERY     HAND SURGERY Right    LEFT HEART CATH AND CORONARY ANGIOGRAPHY Left 05/24/2022   Procedure: LEFT HEART CATH AND CORONARY ANGIOGRAPHY;  Surgeon: Iran Ouch, MD;  Location: [facility] INVASIVE CV LAB;  Service: Cardiovascular;  Laterality: Left;   LOOP RECORDER IMPLANT     NISSEN FUNDOPLICATION  2016   REPLACEMENT TOTAL KNEE Bilateral 2017   TOTAL HIP ARTHROPLASTY Right    TOTAL VAGINAL HYSTERECTOMY     TRANSCAROTID ARTERY REVASCULARIZATION  Left 04/18/2023   Procedure: LEFT Transcarotid Artery Revascularization;  Surgeon: Nada Libman, MD;  Location: [facility] OR;  Service: Vascular;  Laterality: Left;   ULTRASOUND GUIDANCE FOR VASCULAR ACCESS  04/18/2023   Procedure: ULTRASOUND GUIDANCE FOR VASCULAR ACCESS;  Surgeon: Nada Libman, MD;  Location: [facility] OR;  Service: Vascular;;    Social History   Socioeconomic History   Marital status: Widowed    Spouse name: Not on file   Number of children: 2   Years of education: Not on file   Highest education level: Some college, no degree  Occupational History    Comment: retired Photographer  Tobacco Use   Smoking status: Never   Smokeless tobacco: Never  Vaping Use   Vaping status: Never Used  Substance and Sexual Activity   Alcohol use: Not Currently    Comment: Occasional   Drug use: Never   Sexual activity: Never    Birth control/protection: Surgical  Other Topics Concern   Not on file  Social History Narrative   Moved here in 2019, had been living in [place].    Born here, raised daughters here.    Lives with daughter now, [place].   Lost one daughter in car wreck.    Social Determinants of Health   Financial Resource Strain: Low Risk  (09/07/2023)   Overall Financial Resource Strain (CARDIA)    Difficulty of Paying Living Expenses: Not very hard  Food Insecurity: Food Insecurity Present (  09/07/2023)   Hunger Vital Sign    Worried About Running Out of Food in the Last Year: Sometimes true    Ran Out of Food in the Last Year: Never true  Transportation Needs: No Transportation Needs (09/07/2023)   PRAPARE - Administrator, Civil Service (Medical): No    Lack of  Transportation (Non-Medical): No  Physical Activity: Insufficiently Active (09/07/2023)   Exercise Vital Sign    Days of Exercise per Week: 3 days    Minutes of Exercise per Session: 20 min  Stress: No Stress Concern Present (09/07/2023)   [employer] of Occupational Health - Occupational Stress Questionnaire    Feeling of Stress : Only a little  Social Connections: Moderately Integrated (09/07/2023)   Social Connection and Isolation Panel [NHANES]    Frequency of Communication with Friends and Family: More than three times a week    Frequency of Social Gatherings with Friends and Family: More than three times a week    Attends Religious Services: More than 4 times per year    Active Member of [employer] or Organizations: Yes    Attends Banker Meetings: More than 4 times per year    Marital Status: Widowed  Intimate Partner Violence: Not At Risk (11/29/2021)   Humiliation, Afraid, Rape, and Kick questionnaire    Fear of Current or Ex-Partner: No    Emotionally Abused: No    Physically Abused: No    Sexually Abused: No     No Known Allergies   CBC    Component Value Date/Time   WBC 8.4 08/03/2023 1056   WBC 13.7 (H) 04/19/2023 0437   RBC 3.84 08/03/2023 1056   RBC 3.04 (L) 04/19/2023 0437   HGB 11.2 08/03/2023 1056   HCT 34.5 08/03/2023 1056   PLT 279 08/03/2023 1056   MCV 90 08/03/2023 1056   MCH 29.2 08/03/2023 1056   MCH 30.3 04/19/2023 0437   MCHC 32.5 08/03/2023 1056   MCHC 32.9 04/19/2023 0437   RDW 11.9 08/03/2023 1056   LYMPHSABS 1.3 06/15/2023 0909   MONOABS 0.6 02/02/2021 0308   EOSABS 0.5 (H) 06/15/2023 0909   BASOSABS 0.1 06/15/2023 0909    Pulmonary Functions Testing Results:    Latest Ref Rng & Units 09/11/2023    2:22 PM  PFT Results  FVC-Pre L 1.68   FVC-Predicted Pre % 87   FVC-Post L 1.65   FVC-Predicted Post % 86   Pre FEV1/FVC % % 75   Post FEV1/FCV % % 80   FEV1-Pre L 1.26   FEV1-Predicted Pre % 90   FEV1-Post L 1.32   DLCO  uncorrected ml/min/mmHg 11.85   DLCO UNC% % 73   DLVA Predicted % 92   TLC L 3.71   TLC % Predicted % 83   RV % Predicted % 84     Outpatient Medications Prior to Visit  Medication Sig Dispense Refill   acetaminophen (TYLENOL) 500 MG tablet Take 1,000 mg by mouth at bedtime.     albuterol (VENTOLIN HFA) 108 (90 Base) MCG/ACT inhaler Inhale 2 puffs into the lungs every 4 (four) hours as needed for wheezing or shortness of breath. 6.7 g 11   amLODipine (NORVASC) 10 MG tablet Take 1 tablet (10 mg total) by mouth daily. 90 tablet 1   aspirin EC 81 MG tablet Take 81 mg by mouth every evening.     budesonide-formoterol (SYMBICORT) 160-4.5 MCG/ACT inhaler Inhale 2 puffs into the lungs 2 (two) times  daily. 3 each 3   cetirizine (ZYRTEC) 10 MG tablet Take 1 tablet (10 mg total) by mouth daily. (Patient taking differently: Take 10 mg by mouth at bedtime.) 90 tablet 3   Cholecalciferol (VITAMIN D3) 125 MCG (5000 UT) TABS Take by mouth.     clobetasol (TEMOVATE) 0.05 % external solution Apply 1 Application topically in the morning and at bedtime.     clopidogrel (PLAVIX) 75 MG tablet Take 1 tablet (75 mg total) by mouth daily. 90 tablet 3   ketoconazole (NIZORAL) 2 % shampoo Apply topically.     levothyroxine (SYNTHROID) 88 MCG tablet Take 1 tablet (88 mcg total) by mouth daily. 90 tablet 1   meclizine (ANTIVERT) 25 MG tablet Take 0.5 tablets (12.5 mg total) by mouth 3 (three) times daily as needed for dizziness or nausea. 30 tablet 1   methocarbamol (ROBAXIN) 500 MG tablet Take 1 tablet (500 mg total) by mouth at bedtime as needed for muscle spasms. 90 tablet 1   montelukast (SINGULAIR) 10 MG tablet Take 1 tablet (10 mg total) by mouth at bedtime. 90 tablet 1   Multiple Vitamin (MULTIVITAMIN WITH MINERALS) TABS tablet Take 1 tablet by mouth daily with lunch. One A Day for Women     omeprazole (PRILOSEC) 40 MG capsule Take 1 capsule (40 mg total) by mouth 2 (two) times daily. 180 capsule 1   sodium  bicarbonate 650 MG tablet Take 1,300 mg by mouth 2 (two) times daily.     spironolactone (ALDACTONE) 25 MG tablet Take 1 tablet (25 mg total) by mouth daily. 90 tablet 3   triamcinolone cream (KENALOG) 0.1 % Apply 1 Application topically 2 (two) times daily as needed (psoriasis (elbows)).     TURMERIC PO Take 1 capsule by mouth daily with lunch. Qunol Joint Comfort With Turmeric Capsules     valsartan (DIOVAN) 320 MG tablet TAKE 1 TABLET BY MOUTH EVERY DAY 90 tablet 0   zinc sulfate 220 (50 Zn) MG capsule Take 220 mg by mouth daily with lunch.     levothyroxine (SYNTHROID) 75 MCG tablet Take 1 tablet (75 mcg total) by mouth daily before breakfast. 90 tablet 1   Facility-Administered Medications Prior to Visit  Medication Dose Route Frequency Provider Last Rate Last Admin   albuterol (PROVENTIL) (2.5 MG/3ML) 0.083% nebulizer solution 2.5 mg  2.5 mg Nebulization Once Andrea Chute, MD

## 2023-10-02 NOTE — H&P (View-Only) (Signed)
Cardiology Office Note    Date:  10/03/2023   ID:  Andrea Hart, DOB Aug 22, 1936, MRN [id number]  PCP:  Erasmo Downer, MD  Cardiologist:  Lorine Bears, MD  Electrophysiologist:  None   Chief Complaint: Follow up  History of Present Illness:   Andrea Hart is a 87 y.o. female with history of CAD status post stenting in 2013 to the RCA status post PCI/DES to the RCA overlapping the previously placed stent on 05/24/2022, CKD stage III, prior CVA x2, HTN, HLD, carotid artery disease status post left TCAR in 04/2023, GI bleeding in 02/2021, PVCs, hypothyroidism, and asthma who presents for follow-up of CAD.   She underwent stenting of an unknown vessel in [place], [place] in 2013.  In 2017, she suffered a stroke.  ILR did not show any evidence of A-fib, and she has been maintained on aspirin and clopidogrel since.  In 01/2016, she complained of presyncope and underwent event monitoring.  This showed frequent PVCs with a 10% burden.  She reported a long history of PVCs and has been managed with beta-blocker therapy.  She has history of difficult to control hypertension with renal artery ultrasound in 07/2020 showing no evidence of RAS.  Carotid artery ultrasound in 11/2020 showed moderate, nonobstructive bilateral left greater than right ICA disease.  She was admitted in 02/2021 with rectal bleeding with GI feeling the presentation was most consistent with an acute diverticular bleed.  Aspirin and clopidogrel were held and she underwent outpatient colonoscopy in 04/2021 which demonstrated nonbleeding internal hemorrhoids, moderate diverticulosis without evidence of diverticular bleeding.  She was subsequently resumed on aspirin and clopidogrel.  She was seen in the office in 02/2022 and was without symptoms of angina or decompensation.  She remained active with multiple social events.  Carotid artery ultrasound from 02/2022 demonstrated 1 to 39% RICA stenosis and 60 to 79% LICA stenosis (previously 50 to  69%).  She was seen on 05/16/2022 noting a 9 to 3-month history of exertional dyspnea that had become more pronounced.  Symptoms felt similar, though not as severe, to her prior angina leading up to PCI in 2013.  She was without frank chest pain.  Given concerning symptoms, she underwent LHC on 05/24/2022 which showed moderately to severely calcified coronary arteries with significant one-vessel CAD.  There was 95% stenosis in the proximal/mid RCA just before the previously placed stent.  LVEDP was upper limit of normal.  She underwent successful PCI/DES to the RCA overlapping with the previously placed stent.  Post-cath labs notable for a hemoglobin of 9.4.  Carvedilol was decreased secondary to bradycardia.     She was seen in the office on 05/30/2022 and noted improvement in her dyspnea following PCI to the RCA.  She was frustrated as she continued to be fatigued, though this is subsequently improved with cardiac rehab.  Bradycardia improved following the tapering of carvedilol.  Echo on 06/22/2022 demonstrated an EF of 60 to 65%, no regional wall motion abnormalities, moderate LVH, grade 1 diastolic dysfunction, normal RV systolic function and ventricular cavity size, mildly dilated left atrium, mild mitral regurgitation, aortic valve sclerosis without evidence of stenosis, and an estimated right atrial pressure of 3 mmHg.     She was subsequently rechallenged on carvedilol due to elevated BPs, though again reported intolerance leading to its discontinuation.  However, more recently carvedilol was again restarted in 02/2023 due to continued elevated BP readings.  She dropped off BP readings on 04/10/2023, with readings typically in  the 130s to 150s systolic with an occasional reading in the 170s with rare readings in the 180s to 190s over 70s to 90s with heart rates largely in the 60s to 70s bpm with occasional reading in the upper 50s bpm.  At that time, she reported taking carvedilol 3.125 mg twice daily,  amlodipine 10 mg daily, valsartan 320 mg daily, and spironolactone 12.5 mg daily.  It was recommended she titrate spironolactone to 25 mg daily.   Carotid artery ultrasound from 04/10/2023 showed showed 80 to 99% left ICA stenosis with 1 to 39% right ICA stenosis.  This was a progression of the left ICA stenosis that was previously noted to be 60 to 79% in 02/2022.  Subsequent CTA of the head/neck on 04/12/2023 demonstrated 75% stenosis of the proximal left ICA just beyond the bifurcation as well as atherosclerotic calcification at the right carotid bifurcation and internal carotid arteries without significant stenosis.  She was evaluated by vascular surgery and underwent left TCAR on 04/18/2023 for asymptomatic critical ICA stenosis.  Follow-up carotid artery ultrasound on 05/28/2023 showed 1 to 39% right ICA stenosis with patent left ICA stenosis without obvious evidence of stenosis.   She was seen in the office on 06/22/2023 noting an increase in exertional fatigue and dyspnea that became more noticeable following TCAR.  She was without frank chest pain.  Lexiscan MPI on 06/28/2023 showed no significant ischemia with an EF of 70%.  CT attenuation corrected images showed three-vessel coronary artery calcification and aortic atherosclerosis.  Overall, this was a low risk scan.  Echo on 07/25/2023 showed an EF of 60 to 65%, no regional wall motion abnormalities, mild LVH, grade 1 diastolic dysfunction, normal RV systolic function and ventricular cavity size, moderately dilated left atrium, mild mitral regurgitation with moderate mitral annular calcification, aortic valve sclerosis without evidence of stenosis, and an estimated right atrial pressure of 3 mmHg.  She was last seen in the office on 08/03/2023 and continued to feel about the same as she had over the prior several visits, though a little improved off statin therapy.  She continues to note exertional shortness of breath and fatigue without frank angina.  No  evidence of volume overload.  She was subsequently evaluated by pulmonology with PFTs showing normal spirometry without evidence of obstruction, restriction and with normal lung volumes.  DLCO was mildly reduced and felt to possibly be related to her underlying anemia.  Double contrast esophagogram did not show evidence of hiatal hernia.  CPX was declined.  No further pulmonology workup was indicated.  She comes in today feeling about the same as she has over her last several visits, albeit with some improved fatigue following adjustment of levothyroxine.  She does continue to note exertional shortness of breath and indicates "something is wrong."  She is not able to walk in from the parking lot or from check-in to the exam room without having to stop and rest due to exertional dyspnea.  She is without frank chest pain.  Dyspnea is similar to what she previously experienced leading up to her prior PCI.  She also continues to note significant left-sided neck discomfort and decreased range of motion following carotid artery procedure earlier this year.  Lastly, she notes significant pain in the left shoulder stemming from arthritis.  No dizziness, presyncope, or syncope.  No falls or symptoms concerning for bleeding.   Labs independently reviewed: 09/2023 - Hgb 11.7, PLT 270, magnesium 1.7, BUN 19, serum creatinine 1.36, potassium 4.1, albumin 4.2,  AST/ALT normal, TSH 6.290 08/2023 - Free T4 normal 04/2023 - TC 160, TG 116, HDL 54, LDL 39   Past Medical History:  Diagnosis Date   Abdominal hernia    Arthritis    Asthma    Carotid arterial disease (HCC)    a. 11/2020 Carotid U/S: Less than 50% RICA, 50-69% LICA.   CKD (chronic kidney disease), stage III (HCC)    Coronary artery disease    a. 2013 s/p PCI and stent placement in [facility] done for stable angina.   GI bleeding 02/02/2021   History of blood transfusion    History of hiatal hernia    had  surgery   Hyperlipidemia     Hypertension    Hypothyroidism    Pneumonia    years ago   PVC's (premature ventricular contractions)    a. 01/2020 Zio: RSR, 70, frequent PVCs with 10.5% burden.   Stroke [facility])    a. 2017 - ILR did not show afib.    Past Surgical History:  Procedure Laterality Date   ABDOMINAL HYSTERECTOMY     APPENDECTOMY     CATARACT EXTRACTION Bilateral    CHOLECYSTECTOMY     COLONOSCOPY WITH PROPOFOL N/A 04/06/2021   Procedure: COLONOSCOPY WITH PROPOFOL;  Surgeon: Toledo, Boykin Nearing, MD;  Location: [facility] ENDOSCOPY;  Service: Gastroenterology;  Laterality: N/A;   CORONARY ANGIOPLASTY  2013   1xStent [facility] [place].   CORONARY STENT INTERVENTION N/A 05/24/2022   Procedure: CORONARY STENT INTERVENTION;  Surgeon: Iran Ouch, MD;  Location: [facility] INVASIVE CV LAB;  Service: Cardiovascular;  Laterality: N/A;   EYE SURGERY     HAND SURGERY Right    LEFT HEART CATH AND CORONARY ANGIOGRAPHY Left 05/24/2022   Procedure: LEFT HEART CATH AND CORONARY ANGIOGRAPHY;  Surgeon: Iran Ouch, MD;  Location: [facility] INVASIVE CV LAB;  Service: Cardiovascular;  Laterality: Left;   LOOP RECORDER IMPLANT     NISSEN FUNDOPLICATION  2016   REPLACEMENT TOTAL KNEE Bilateral 2017   TOTAL HIP ARTHROPLASTY Right    TOTAL VAGINAL HYSTERECTOMY     TRANSCAROTID ARTERY REVASCULARIZATION  Left 04/18/2023   Procedure: LEFT Transcarotid Artery Revascularization;  Surgeon: Nada Libman, MD;  Location: [facility] OR;  Service: Vascular;  Laterality: Left;   ULTRASOUND GUIDANCE FOR VASCULAR ACCESS  04/18/2023   Procedure: ULTRASOUND GUIDANCE FOR VASCULAR ACCESS;  Surgeon: Nada Libman, MD;  Location: [facility] OR;  Service: Vascular;;    Current Medications: Current Meds  Medication Sig   acetaminophen (TYLENOL) 500 MG tablet Take 1,000 mg by mouth at bedtime.   albuterol (VENTOLIN HFA) 108 (90 Base) MCG/ACT inhaler Inhale 2 puffs into the lungs every 4 (four) hours as needed for wheezing or shortness of breath.   amLODipine  (NORVASC) 10 MG tablet Take 1 tablet (10 mg total) by mouth daily.   aspirin EC 81 MG tablet Take 81 mg by mouth every evening.   budesonide-formoterol (SYMBICORT) 160-4.5 MCG/ACT inhaler Inhale 2 puffs into the lungs 2 (two) times daily.   cetirizine (ZYRTEC) 10 MG tablet Take 1 tablet (10 mg total) by mouth daily. (Patient taking differently: Take 10 mg by mouth at bedtime.)   Cholecalciferol (VITAMIN D3) 125 MCG (5000 UT) TABS Take by mouth.   clobetasol (TEMOVATE) 0.05 % external solution Apply 1 Application topically in the morning and at bedtime.   clopidogrel (PLAVIX) 75 MG tablet Take 1 tablet (75 mg total) by mouth daily.   ketoconazole (NIZORAL) 2 % shampoo Apply  topically.   levothyroxine (SYNTHROID) 88 MCG tablet Take 1 tablet (88 mcg total) by mouth daily.   methocarbamol (ROBAXIN) 500 MG tablet Take 1 tablet (500 mg total) by mouth at bedtime as needed for muscle spasms.   montelukast (SINGULAIR) 10 MG tablet Take 1 tablet (10 mg total) by mouth at bedtime.   Multiple Vitamin (MULTIVITAMIN WITH MINERALS) TABS tablet Take 1 tablet by mouth daily with lunch. One A Day for Women   omeprazole (PRILOSEC) 40 MG capsule Take 1 capsule (40 mg total) by mouth 2 (two) times daily.   rosuvastatin (CRESTOR) 20 MG tablet Take 1 tablet (20 mg total) by mouth daily.   sodium bicarbonate 650 MG tablet Take 1,300 mg by mouth 2 (two) times daily.   spironolactone (ALDACTONE) 25 MG tablet Take 1 tablet (25 mg total) by mouth daily.   triamcinolone cream (KENALOG) 0.1 % Apply 1 Application topically 2 (two) times daily as needed (psoriasis (elbows)).   TURMERIC PO Take 1 capsule by mouth daily with lunch. Qunol Joint Comfort With Turmeric Capsules   valsartan (DIOVAN) 320 MG tablet TAKE 1 TABLET BY MOUTH EVERY DAY   zinc sulfate 220 (50 Zn) MG capsule Take 220 mg by mouth daily with lunch.    Allergies:   Patient has no known allergies.   Social History   Socioeconomic History   Marital status:  Widowed    Spouse name: Not on file   Number of children: 2   Years of education: Not on file   Highest education level: Some college, no degree  Occupational History    Comment: retired Photographer  Tobacco Use   Smoking status: Never   Smokeless tobacco: Never  Vaping Use   Vaping status: Never Used  Substance and Sexual Activity   Alcohol use: Not Currently    Comment: Occasional   Drug use: Never   Sexual activity: Never    Birth control/protection: Surgical  Other Topics Concern   Not on file  Social History Narrative   Moved here in 2019, had been living in [place].    Born here, raised daughters here.    Lives with daughter now, [place].   Lost one daughter in car wreck.    Social Determinants of Health   Financial Resource Strain: Low Risk  (09/07/2023)   Overall Financial Resource Strain (CARDIA)    Difficulty of Paying Living Expenses: Not very hard  Food Insecurity: Food Insecurity Present (09/07/2023)   Hunger Vital Sign    Worried About Running Out of Food in the Last Year: Sometimes true    Ran Out of Food in the Last Year: Never true  Transportation Needs: No Transportation Needs (09/07/2023)   PRAPARE - Administrator, Civil Service (Medical): No    Lack of Transportation (Non-Medical): No  Physical Activity: Insufficiently Active (09/07/2023)   Exercise Vital Sign    Days of Exercise per Week: 3 days    Minutes of Exercise per Session: 20 min  Stress: No Stress Concern Present (09/07/2023)   [employer] of Occupational Health - Occupational Stress Questionnaire    Feeling of Stress : Only a little  Social Connections: Moderately Integrated (09/07/2023)   Social Connection and Isolation Panel [NHANES]    Frequency of Communication with Friends and Family: More than three times a week    Frequency of Social Gatherings with Friends and Family: More than three times a week    Attends Religious Services: More than 4 times per year  Active  Member of Clubs or Organizations: Yes    Attends Banker Meetings: More than 4 times per year    Marital Status: Widowed     Family History:  The patient's family history includes Heart Problems in her brother, father, and mother; Heart attack in her brother and mother. There is no history of Colon cancer or Breast cancer.  ROS:   12-point review of systems is negative unless otherwise noted in the HPI.   EKGs/Labs/Other Studies Reviewed:    Studies reviewed were summarized above. The additional studies were reviewed today:   2D echo 07/25/2023: 1. Left ventricular ejection fraction, by estimation, is 60 to 65%. The  left ventricle has normal function. The left ventricle has no regional  wall motion abnormalities. There is mild left ventricular hypertrophy.  Left ventricular diastolic parameters  are consistent with Grade I diastolic dysfunction (impaired relaxation).  The average left ventricular global longitudinal strain is -15.6 %.   2. Right ventricular systolic function is normal. The right ventricular  size is normal. Tricuspid regurgitation signal is inadequate for assessing  PA pressure.   3. Left atrial size was moderately dilated.   4. The mitral valve is normal in structure. Mild mitral valve  regurgitation. No evidence of mitral stenosis. Moderate mitral annular  calcification.   5. The aortic valve is normal in structure. There is mild calcification  of the aortic valve. Aortic valve regurgitation is not visualized. Aortic  valve sclerosis/calcification is present, without any evidence of aortic  stenosis.   6. The inferior vena cava is normal in size with greater than 50%  respiratory variability, suggesting right atrial pressure of 3 mmHg.  __________   Eugenie Birks MPI 06/28/2023: Pharmacological myocardial perfusion imaging study with no significant  ischemia Normal wall motion, EF estimated at 70% No EKG changes concerning for ischemia at peak  stress or in recovery. Left bundle branch block noted during infusion that by report returned to narrow complex QRS in recovery CT attenuation correction images with three-vessel coronary calcification, moderate aortic atherosclerosis Low risk scan __________   Carotid artery ultrasound 05/28/2023: Summary:  Right Carotid: Velocities in the right ICA are consistent with a 1-39% stenosis.   Left Carotid: The ECA appears >50% stenosed. Patent stent without obvious evidence of stenosis.   Vertebrals: Bilateral vertebral arteries demonstrate antegrade flow.  __________   Carotid artery ultrasound 04/10/2023: Summary:  Right Carotid: Velocities in the right ICA are consistent with a 1-39% stenosis.   Left Carotid: Velocities in the left ICA are consistent with a 80-99% stenosis. The ECA appears >50% stenosed.  __________   2D echo 06/22/2022: 1. Left ventricular ejection fraction, by estimation, is 60 to 65%. The  left ventricle has normal function. The left ventricle has no regional  wall motion abnormalities. There is moderate left ventricular hypertrophy.  Left ventricular diastolic  parameters are consistent with Grade I diastolic dysfunction (impaired  relaxation).   2. Right ventricular systolic function is normal. The right ventricular  size is normal.   3. Left atrial size was mildly dilated.   4. The mitral valve is normal in structure. Mild mitral valve  regurgitation. No evidence of mitral stenosis.   5. The aortic valve is normal in structure. Aortic valve regurgitation is  not visualized. Aortic valve sclerosis/calcification is present, without  any evidence of aortic stenosis.   6. The inferior vena cava is normal in size with greater than 50%  respiratory variability, suggesting right atrial pressure of  3 mmHg. __________   LHC 05/24/2022:   Mid RCA lesion is 10% stenosed.   Prox RCA to Mid RCA lesion is 95% stenosed.   Prox LAD to Mid LAD lesion is 20% stenosed.   A  drug-eluting stent was successfully placed using a STENT ONYX FRONTIER [id number].   Post intervention, there is a 0% residual stenosis.   1.  Moderately to severely calcified coronary arteries with significant one-vessel coronary artery disease.   There is 95% stenosis in the proximal/mid right coronary artery just before the previously placed stent.   2.  Left ventricular angiography was not performed due to chronic kidney disease.  LVEDP was at the upper limit of normal. 3.  Successful angioplasty and drug-eluting stent placement to the right coronary artery overlapping with the previously placed stent. 4.  Difficult catheterization via the right radial artery due to significant tortuosity of the innominate and right subclavian arteries.  Consider alternative route in the future.   Recommendations: Dual antiplatelet therapy for at least 6 months. Aggressive treatment of risk factors. __________   Carotid artery ultrasound 02/23/2022: Summary:  Right Carotid: Velocities in the right ICA are consistent with a 1-39%  stenosis. Non-hemodynamically significant plaque <50% noted in the  CCA. The ECA appears <50% stenosed.   Left Carotid: Velocities in the left ICA are consistent with a 60-79%  stenosis. Non-hemodynamically significant plaque <50% noted in the  CCA. The ECA appears >50% stenosed.      The bilateral lobes of the thyroid had a globuated  appearance   Suggest follow up study in 12 months.  __________   Renal artery ultrasound 07/16/2020: Summary:  Largest Aortic Diameter: 2.0 cm     Renal:     Right: Normal size right kidney. Normal right Resisitive Index.         Normal cortical thickness of right kidney. No evidence of         right renal artery stenosis. RRV flow present.  Left:  LRV flow present. No evidence of left renal artery stenosis.         Normal size of left kidney. Normal left Resistive Index.         Normal cortical thickness of the left kidney. __________    Outpatient cardiac monitoring 01/2020: Normal sinus rhythm with an average heart rate of 70 bpm. 1 short run of SVT lasted 4 beats. Frequent PVCs with a burden of 10.5%.  Ventricular bigeminy and trigeminy were present.   EKG:  EKG is ordered today.  The EKG ordered today demonstrates NSR, 75 bpm, rare isolated PVC, LVH, poor R wave progression along the precordial leads, nonspecific ST/T changes  Recent Labs: 09/10/2023: ALT 20; BUN 19; Creatinine, Ser 1.36; Magnesium 1.7; Potassium 4.1; Sodium 140; TSH 6.290 09/27/2023: Hemoglobin 11.7; Platelets 270  Recent Lipid Panel    Component Value Date/Time   CHOL 116 04/19/2023 0437   CHOL 160 07/17/2022 0903   TRIG 116 04/19/2023 0437   HDL 54 04/19/2023 0437   HDL 69 07/17/2022 0903   CHOLHDL 2.1 04/19/2023 0437   VLDL 23 04/19/2023 0437   LDLCALC 39 04/19/2023 0437   LDLCALC 70 07/17/2022 0903    PHYSICAL EXAM:    VS:  BP (!) 152/66 (BP Location: Left Arm, Patient Position: Sitting, Cuff Size: Normal)   Pulse 82   Ht 5' (1.524 m)   Wt 139 lb 9.6 oz (63.3 kg)   SpO2 98%   BMI 27.26 kg/m   BMI: Body  mass index is 27.26 kg/m.  Physical Exam Vitals reviewed.  Constitutional:      Appearance: She is well-developed.  HENT:     Head: Normocephalic and atraumatic.  Eyes:     General:        Right eye: No discharge.        Left eye: No discharge.  Neck:     Vascular: No JVD.  Cardiovascular:     Rate and Rhythm: Normal rate and regular rhythm.     Pulses:          Posterior tibial pulses are 2+ on the right side and 2+ on the left side.     Heart sounds: Normal heart sounds, S1 normal and S2 normal. Heart sounds not distant. No midsystolic click and no opening snap. No murmur heard.    No friction rub.  Pulmonary:     Effort: Pulmonary effort is normal. No respiratory distress.     Breath sounds: Normal breath sounds. No decreased breath sounds, wheezing, rhonchi or rales.  Chest:     Chest wall: No tenderness.   Abdominal:     General: There is no distension.  Musculoskeletal:     Cervical back: Normal range of motion.     Right lower leg: No edema.     Left lower leg: No edema.  Skin:    General: Skin is warm and dry.     Nails: There is no clubbing.  Neurological:     Mental Status: She is alert and oriented to person, place, and time.  Psychiatric:        Speech: Speech normal.        Behavior: Behavior normal.        Thought Content: Thought content normal.        Judgment: Judgment normal.     Wt Readings from Last 3 Encounters:  10/03/23 139 lb 9.6 oz (63.3 kg)  09/27/23 141 lb 12.8 oz (64.3 kg)  09/10/23 142 lb 12.8 oz (64.8 kg)     ASSESSMENT & PLAN:   CAD involving native coronary arteries with exertional dyspnea concerning for anginal equivalent: She continues to note exertional dyspnea throughout most of this year with brief improvement in dyspnea following prior PCI.  Exertional fatigue is improved with dosage adjustment of levothyroxine.  She has undergone extensive noninvasive cardiac testing as well as pulmonology evaluation, though these results have been unrevealing.  She continues to report "something is wrong."  At this point, we have agreed to proceed with [facility] for further cardiac evaluation.  Patient's functional status limits utility of his CPX.  Continue aggressive risk factor modification and secondary prevention including aspirin, clopidogrel, amlodipine, and resumption of rosuvastatin as outlined below.  HTN: Blood pressure is mildly elevated in the office today, though typically well-controlled.  She remains on amlodipine, spironolactone, and valsartan.  Not on beta-blocker secondary to prior fatigue and bradycardic rates.  Moving forward, may need to initiate low-dose carvedilol as a rechallenge.  HLD: LDL 39 in 04/2023 with normal AST/ALT in 06/2023.  Previously taken off rosuvastatin with slight improvement in fatigue, though since that she has had dosage  adjustment of her levothyroxine with improvement in fatigue we will rechallenge her with rosuvastatin 20 mg.  Follow-up fasting lipid panel and LFT in 2 months.  PVCs: Rare isolated PVC noted on EKG.  No longer on beta-blocker with noted fatigue and prior bradycardia.  Carotid artery stenosis: Status post left TCAR in 04/2023 with ultrasound in 05/2023 showing  patent stent with no obvious evidence of stenosis.  Notes ongoing left-sided neck discomfort and decreased range of motion since procedure.  Recommend follow-up by vascular surgery.     History of CVA: No new deficits.  Remains on aspirin and clopidogrel.  Reinitiating rosuvastatin as outlined above.  History of GI bleed/iron deficiency anemia: No symptoms concerning for bleeding.  Recent Hgb stable.  CKD stage III: Stable on most recent check.   Informed Consent   Shared Decision Making/Informed Consent{  The risks [stroke (1 in 1000), death (1 in 1000), kidney failure [usually temporary] (1 in 500), bleeding (1 in 200), allergic reaction [possibly serious] (1 in 200)], benefits (diagnostic support and management of coronary artery disease) and alternatives of a cardiac catheterization were discussed in detail with Ms. Pharo and she is willing to proceed.        Disposition: F/u with Dr. Kirke Corin or an APP 1-2 weeks [facility].   Medication Adjustments/Labs and Tests Ordered: Current medicines are reviewed at length with the patient today.  Concerns regarding medicines are outlined above. Medication changes, Labs and Tests ordered today are summarized above and listed in the Patient Instructions accessible in Encounters.   Signed, Eula Listen, PA-C 10/03/2023 5:20 PM     [facility] [place] [address] [place], [place] [address] [phone]

## 2023-10-02 NOTE — Progress Notes (Unsigned)
Cardiology Office Note    Date:  10/03/2023   ID:  Andrea Hart, DOB 20-Dec-1935, MRN [id number]  PCP:  Erasmo Downer, MD  Cardiologist:  Lorine Bears, MD  Electrophysiologist:  None   Chief Complaint: Follow up  History of Present Illness:   Andrea Hart is a 87 y.o. female with history of CAD status post stenting in 2013 to the RCA status post PCI/DES to the RCA overlapping the previously placed stent on 05/24/2022, CKD stage III, prior CVA x2, HTN, HLD, carotid artery disease status post left TCAR in 04/2023, GI bleeding in 02/2021, PVCs, hypothyroidism, and asthma who presents for follow-up of CAD.   She underwent stenting of an unknown vessel in [place], [place] in 2013.  In 2017, she suffered a stroke.  ILR did not show any evidence of A-fib, and she has been maintained on aspirin and clopidogrel since.  In 01/2016, she complained of presyncope and underwent event monitoring.  This showed frequent PVCs with a 10% burden.  She reported a long history of PVCs and has been managed with beta-blocker therapy.  She has history of difficult to control hypertension with renal artery ultrasound in 07/2020 showing no evidence of RAS.  Carotid artery ultrasound in 11/2020 showed moderate, nonobstructive bilateral left greater than right ICA disease.  She was admitted in 02/2021 with rectal bleeding with GI feeling the presentation was most consistent with an acute diverticular bleed.  Aspirin and clopidogrel were held and she underwent outpatient colonoscopy in 04/2021 which demonstrated nonbleeding internal hemorrhoids, moderate diverticulosis without evidence of diverticular bleeding.  She was subsequently resumed on aspirin and clopidogrel.  She was seen in the office in 02/2022 and was without symptoms of angina or decompensation.  She remained active with multiple social events.  Carotid artery ultrasound from 02/2022 demonstrated 1 to 39% RICA stenosis and 60 to 79% LICA stenosis (previously 50 to  69%).  She was seen on 05/16/2022 noting a 9 to 48-month history of exertional dyspnea that had become more pronounced.  Symptoms felt similar, though not as severe, to her prior angina leading up to PCI in 2013.  She was without frank chest pain.  Given concerning symptoms, she underwent LHC on 05/24/2022 which showed moderately to severely calcified coronary arteries with significant one-vessel CAD.  There was 95% stenosis in the proximal/mid RCA just before the previously placed stent.  LVEDP was upper limit of normal.  She underwent successful PCI/DES to the RCA overlapping with the previously placed stent.  Post-cath labs notable for a hemoglobin of 9.4.  Carvedilol was decreased secondary to bradycardia.     She was seen in the office on 05/30/2022 and noted improvement in her dyspnea following PCI to the RCA.  She was frustrated as she continued to be fatigued, though this is subsequently improved with cardiac rehab.  Bradycardia improved following the tapering of carvedilol.  Echo on 06/22/2022 demonstrated an EF of 60 to 65%, no regional wall motion abnormalities, moderate LVH, grade 1 diastolic dysfunction, normal RV systolic function and ventricular cavity size, mildly dilated left atrium, mild mitral regurgitation, aortic valve sclerosis without evidence of stenosis, and an estimated right atrial pressure of 3 mmHg.     She was subsequently rechallenged on carvedilol due to elevated BPs, though again reported intolerance leading to its discontinuation.  However, more recently carvedilol was again restarted in 02/2023 due to continued elevated BP readings.  She dropped off BP readings on 04/10/2023, with readings typically in  the 130s to 150s systolic with an occasional reading in the 170s with rare readings in the 180s to 190s over 70s to 90s with heart rates largely in the 60s to 70s bpm with occasional reading in the upper 50s bpm.  At that time, she reported taking carvedilol 3.125 mg twice daily,  amlodipine 10 mg daily, valsartan 320 mg daily, and spironolactone 12.5 mg daily.  It was recommended she titrate spironolactone to 25 mg daily.   Carotid artery ultrasound from 04/10/2023 showed showed 80 to 99% left ICA stenosis with 1 to 39% right ICA stenosis.  This was a progression of the left ICA stenosis that was previously noted to be 60 to 79% in 02/2022.  Subsequent CTA of the head/neck on 04/12/2023 demonstrated 75% stenosis of the proximal left ICA just beyond the bifurcation as well as atherosclerotic calcification at the right carotid bifurcation and internal carotid arteries without significant stenosis.  She was evaluated by vascular surgery and underwent left TCAR on 04/18/2023 for asymptomatic critical ICA stenosis.  Follow-up carotid artery ultrasound on 05/28/2023 showed 1 to 39% right ICA stenosis with patent left ICA stenosis without obvious evidence of stenosis.   She was seen in the office on 06/22/2023 noting an increase in exertional fatigue and dyspnea that became more noticeable following TCAR.  She was without frank chest pain.  Lexiscan MPI on 06/28/2023 showed no significant ischemia with an EF of 70%.  CT attenuation corrected images showed three-vessel coronary artery calcification and aortic atherosclerosis.  Overall, this was a low risk scan.  Echo on 07/25/2023 showed an EF of 60 to 65%, no regional wall motion abnormalities, mild LVH, grade 1 diastolic dysfunction, normal RV systolic function and ventricular cavity size, moderately dilated left atrium, mild mitral regurgitation with moderate mitral annular calcification, aortic valve sclerosis without evidence of stenosis, and an estimated right atrial pressure of 3 mmHg.  She was last seen in the office on 08/03/2023 and continued to feel about the same as she had over the prior several visits, though a little improved off statin therapy.  She continues to note exertional shortness of breath and fatigue without frank angina.  No  evidence of volume overload.  She was subsequently evaluated by pulmonology with PFTs showing normal spirometry without evidence of obstruction, restriction and with normal lung volumes.  DLCO was mildly reduced and felt to possibly be related to her underlying anemia.  Double contrast esophagogram did not show evidence of hiatal hernia.  CPX was declined.  No further pulmonology workup was indicated.  She comes in today feeling about the same as she has over her last several visits, albeit with some improved fatigue following adjustment of levothyroxine.  She does continue to note exertional shortness of breath and indicates "something is wrong."  She is not able to walk in from the parking lot or from check-in to the exam room without having to stop and rest due to exertional dyspnea.  She is without frank chest pain.  Dyspnea is similar to what she previously experienced leading up to her prior PCI.  She also continues to note significant left-sided neck discomfort and decreased range of motion following carotid artery procedure earlier this year.  Lastly, she notes significant pain in the left shoulder stemming from arthritis.  No dizziness, presyncope, or syncope.  No falls or symptoms concerning for bleeding.   Labs independently reviewed: 09/2023 - Hgb 11.7, PLT 270, magnesium 1.7, BUN 19, serum creatinine 1.36, potassium 4.1, albumin 4.2,  AST/ALT normal, TSH 6.290 08/2023 - Free T4 normal 04/2023 - TC 160, TG 116, HDL 54, LDL 39   Past Medical History:  Diagnosis Date   Abdominal hernia    Arthritis    Asthma    Carotid arterial disease (HCC)    a. 11/2020 Carotid U/S: Less than 50% RICA, 50-69% LICA.   CKD (chronic kidney disease), stage III (HCC)    Coronary artery disease    a. 2013 s/p PCI and stent placement in [facility] done for stable angina.   GI bleeding 02/02/2021   History of blood transfusion    History of hiatal hernia    had  surgery   Hyperlipidemia     Hypertension    Hypothyroidism    Pneumonia    years ago   PVC's (premature ventricular contractions)    a. 01/2020 Zio: RSR, 70, frequent PVCs with 10.5% burden.   Stroke [facility])    a. 2017 - ILR did not show afib.    Past Surgical History:  Procedure Laterality Date   ABDOMINAL HYSTERECTOMY     APPENDECTOMY     CATARACT EXTRACTION Bilateral    CHOLECYSTECTOMY     COLONOSCOPY WITH PROPOFOL N/A 04/06/2021   Procedure: COLONOSCOPY WITH PROPOFOL;  Surgeon: Toledo, Boykin Nearing, MD;  Location: [facility] ENDOSCOPY;  Service: Gastroenterology;  Laterality: N/A;   CORONARY ANGIOPLASTY  2013   1xStent [facility] [place].   CORONARY STENT INTERVENTION N/A 05/24/2022   Procedure: CORONARY STENT INTERVENTION;  Surgeon: Iran Ouch, MD;  Location: [facility] INVASIVE CV LAB;  Service: Cardiovascular;  Laterality: N/A;   EYE SURGERY     HAND SURGERY Right    LEFT HEART CATH AND CORONARY ANGIOGRAPHY Left 05/24/2022   Procedure: LEFT HEART CATH AND CORONARY ANGIOGRAPHY;  Surgeon: Iran Ouch, MD;  Location: [facility] INVASIVE CV LAB;  Service: Cardiovascular;  Laterality: Left;   LOOP RECORDER IMPLANT     NISSEN FUNDOPLICATION  2016   REPLACEMENT TOTAL KNEE Bilateral 2017   TOTAL HIP ARTHROPLASTY Right    TOTAL VAGINAL HYSTERECTOMY     TRANSCAROTID ARTERY REVASCULARIZATION  Left 04/18/2023   Procedure: LEFT Transcarotid Artery Revascularization;  Surgeon: Nada Libman, MD;  Location: [facility] OR;  Service: Vascular;  Laterality: Left;   ULTRASOUND GUIDANCE FOR VASCULAR ACCESS  04/18/2023   Procedure: ULTRASOUND GUIDANCE FOR VASCULAR ACCESS;  Surgeon: Nada Libman, MD;  Location: [facility] OR;  Service: Vascular;;    Current Medications: Current Meds  Medication Sig   acetaminophen (TYLENOL) 500 MG tablet Take 1,000 mg by mouth at bedtime.   albuterol (VENTOLIN HFA) 108 (90 Base) MCG/ACT inhaler Inhale 2 puffs into the lungs every 4 (four) hours as needed for wheezing or shortness of breath.   amLODipine  (NORVASC) 10 MG tablet Take 1 tablet (10 mg total) by mouth daily.   aspirin EC 81 MG tablet Take 81 mg by mouth every evening.   budesonide-formoterol (SYMBICORT) 160-4.5 MCG/ACT inhaler Inhale 2 puffs into the lungs 2 (two) times daily.   cetirizine (ZYRTEC) 10 MG tablet Take 1 tablet (10 mg total) by mouth daily. (Patient taking differently: Take 10 mg by mouth at bedtime.)   Cholecalciferol (VITAMIN D3) 125 MCG (5000 UT) TABS Take by mouth.   clobetasol (TEMOVATE) 0.05 % external solution Apply 1 Application topically in the morning and at bedtime.   clopidogrel (PLAVIX) 75 MG tablet Take 1 tablet (75 mg total) by mouth daily.   ketoconazole (NIZORAL) 2 % shampoo Apply  topically.   levothyroxine (SYNTHROID) 88 MCG tablet Take 1 tablet (88 mcg total) by mouth daily.   methocarbamol (ROBAXIN) 500 MG tablet Take 1 tablet (500 mg total) by mouth at bedtime as needed for muscle spasms.   montelukast (SINGULAIR) 10 MG tablet Take 1 tablet (10 mg total) by mouth at bedtime.   Multiple Vitamin (MULTIVITAMIN WITH MINERALS) TABS tablet Take 1 tablet by mouth daily with lunch. One A Day for Women   omeprazole (PRILOSEC) 40 MG capsule Take 1 capsule (40 mg total) by mouth 2 (two) times daily.   rosuvastatin (CRESTOR) 20 MG tablet Take 1 tablet (20 mg total) by mouth daily.   sodium bicarbonate 650 MG tablet Take 1,300 mg by mouth 2 (two) times daily.   spironolactone (ALDACTONE) 25 MG tablet Take 1 tablet (25 mg total) by mouth daily.   triamcinolone cream (KENALOG) 0.1 % Apply 1 Application topically 2 (two) times daily as needed (psoriasis (elbows)).   TURMERIC PO Take 1 capsule by mouth daily with lunch. Qunol Joint Comfort With Turmeric Capsules   valsartan (DIOVAN) 320 MG tablet TAKE 1 TABLET BY MOUTH EVERY DAY   zinc sulfate 220 (50 Zn) MG capsule Take 220 mg by mouth daily with lunch.    Allergies:   Patient has no known allergies.   Social History   Socioeconomic History   Marital status:  Widowed    Spouse name: Not on file   Number of children: 2   Years of education: Not on file   Highest education level: Some college, no degree  Occupational History    Comment: retired Photographer  Tobacco Use   Smoking status: Never   Smokeless tobacco: Never  Vaping Use   Vaping status: Never Used  Substance and Sexual Activity   Alcohol use: Not Currently    Comment: Occasional   Drug use: Never   Sexual activity: Never    Birth control/protection: Surgical  Other Topics Concern   Not on file  Social History Narrative   Moved here in 2019, had been living in [place].    Born here, raised daughters here.    Lives with daughter now, [place].   Lost one daughter in car wreck.    Social Determinants of Health   Financial Resource Strain: Low Risk  (09/07/2023)   Overall Financial Resource Strain (CARDIA)    Difficulty of Paying Living Expenses: Not very hard  Food Insecurity: Food Insecurity Present (09/07/2023)   Hunger Vital Sign    Worried About Running Out of Food in the Last Year: Sometimes true    Ran Out of Food in the Last Year: Never true  Transportation Needs: No Transportation Needs (09/07/2023)   PRAPARE - Administrator, Civil Service (Medical): No    Lack of Transportation (Non-Medical): No  Physical Activity: Insufficiently Active (09/07/2023)   Exercise Vital Sign    Days of Exercise per Week: 3 days    Minutes of Exercise per Session: 20 min  Stress: No Stress Concern Present (09/07/2023)   [employer] of Occupational Health - Occupational Stress Questionnaire    Feeling of Stress : Only a little  Social Connections: Moderately Integrated (09/07/2023)   Social Connection and Isolation Panel [NHANES]    Frequency of Communication with Friends and Family: More than three times a week    Frequency of Social Gatherings with Friends and Family: More than three times a week    Attends Religious Services: More than 4 times per year  Active  Member of Clubs or Organizations: Yes    Attends Banker Meetings: More than 4 times per year    Marital Status: Widowed     Family History:  The patient's family history includes Heart Problems in her brother, father, and mother; Heart attack in her brother and mother. There is no history of Colon cancer or Breast cancer.  ROS:   12-point review of systems is negative unless otherwise noted in the HPI.   EKGs/Labs/Other Studies Reviewed:    Studies reviewed were summarized above. The additional studies were reviewed today:   2D echo 07/25/2023: 1. Left ventricular ejection fraction, by estimation, is 60 to 65%. The  left ventricle has normal function. The left ventricle has no regional  wall motion abnormalities. There is mild left ventricular hypertrophy.  Left ventricular diastolic parameters  are consistent with Grade I diastolic dysfunction (impaired relaxation).  The average left ventricular global longitudinal strain is -15.6 %.   2. Right ventricular systolic function is normal. The right ventricular  size is normal. Tricuspid regurgitation signal is inadequate for assessing  PA pressure.   3. Left atrial size was moderately dilated.   4. The mitral valve is normal in structure. Mild mitral valve  regurgitation. No evidence of mitral stenosis. Moderate mitral annular  calcification.   5. The aortic valve is normal in structure. There is mild calcification  of the aortic valve. Aortic valve regurgitation is not visualized. Aortic  valve sclerosis/calcification is present, without any evidence of aortic  stenosis.   6. The inferior vena cava is normal in size with greater than 50%  respiratory variability, suggesting right atrial pressure of 3 mmHg.  __________   Eugenie Birks MPI 06/28/2023: Pharmacological myocardial perfusion imaging study with no significant  ischemia Normal wall motion, EF estimated at 70% No EKG changes concerning for ischemia at peak  stress or in recovery. Left bundle branch block noted during infusion that by report returned to narrow complex QRS in recovery CT attenuation correction images with three-vessel coronary calcification, moderate aortic atherosclerosis Low risk scan __________   Carotid artery ultrasound 05/28/2023: Summary:  Right Carotid: Velocities in the right ICA are consistent with a 1-39% stenosis.   Left Carotid: The ECA appears >50% stenosed. Patent stent without obvious evidence of stenosis.   Vertebrals: Bilateral vertebral arteries demonstrate antegrade flow.  __________   Carotid artery ultrasound 04/10/2023: Summary:  Right Carotid: Velocities in the right ICA are consistent with a 1-39% stenosis.   Left Carotid: Velocities in the left ICA are consistent with a 80-99% stenosis. The ECA appears >50% stenosed.  __________   2D echo 06/22/2022: 1. Left ventricular ejection fraction, by estimation, is 60 to 65%. The  left ventricle has normal function. The left ventricle has no regional  wall motion abnormalities. There is moderate left ventricular hypertrophy.  Left ventricular diastolic  parameters are consistent with Grade I diastolic dysfunction (impaired  relaxation).   2. Right ventricular systolic function is normal. The right ventricular  size is normal.   3. Left atrial size was mildly dilated.   4. The mitral valve is normal in structure. Mild mitral valve  regurgitation. No evidence of mitral stenosis.   5. The aortic valve is normal in structure. Aortic valve regurgitation is  not visualized. Aortic valve sclerosis/calcification is present, without  any evidence of aortic stenosis.   6. The inferior vena cava is normal in size with greater than 50%  respiratory variability, suggesting right atrial pressure of  3 mmHg. __________   LHC 05/24/2022:   Mid RCA lesion is 10% stenosed.   Prox RCA to Mid RCA lesion is 95% stenosed.   Prox LAD to Mid LAD lesion is 20% stenosed.   A  drug-eluting stent was successfully placed using a STENT ONYX FRONTIER [id number].   Post intervention, there is a 0% residual stenosis.   1.  Moderately to severely calcified coronary arteries with significant one-vessel coronary artery disease.   There is 95% stenosis in the proximal/mid right coronary artery just before the previously placed stent.   2.  Left ventricular angiography was not performed due to chronic kidney disease.  LVEDP was at the upper limit of normal. 3.  Successful angioplasty and drug-eluting stent placement to the right coronary artery overlapping with the previously placed stent. 4.  Difficult catheterization via the right radial artery due to significant tortuosity of the innominate and right subclavian arteries.  Consider alternative route in the future.   Recommendations: Dual antiplatelet therapy for at least 6 months. Aggressive treatment of risk factors. __________   Carotid artery ultrasound 02/23/2022: Summary:  Right Carotid: Velocities in the right ICA are consistent with a 1-39%  stenosis. Non-hemodynamically significant plaque <50% noted in the  CCA. The ECA appears <50% stenosed.   Left Carotid: Velocities in the left ICA are consistent with a 60-79%  stenosis. Non-hemodynamically significant plaque <50% noted in the  CCA. The ECA appears >50% stenosed.      The bilateral lobes of the thyroid had a globuated  appearance   Suggest follow up study in 12 months.  __________   Renal artery ultrasound 07/16/2020: Summary:  Largest Aortic Diameter: 2.0 cm     Renal:     Right: Normal size right kidney. Normal right Resisitive Index.         Normal cortical thickness of right kidney. No evidence of         right renal artery stenosis. RRV flow present.  Left:  LRV flow present. No evidence of left renal artery stenosis.         Normal size of left kidney. Normal left Resistive Index.         Normal cortical thickness of the left kidney. __________    Outpatient cardiac monitoring 01/2020: Normal sinus rhythm with an average heart rate of 70 bpm. 1 short run of SVT lasted 4 beats. Frequent PVCs with a burden of 10.5%.  Ventricular bigeminy and trigeminy were present.   EKG:  EKG is ordered today.  The EKG ordered today demonstrates NSR, 75 bpm, rare isolated PVC, LVH, poor R wave progression along the precordial leads, nonspecific ST/T changes  Recent Labs: 09/10/2023: ALT 20; BUN 19; Creatinine, Ser 1.36; Magnesium 1.7; Potassium 4.1; Sodium 140; TSH 6.290 09/27/2023: Hemoglobin 11.7; Platelets 270  Recent Lipid Panel    Component Value Date/Time   CHOL 116 04/19/2023 0437   CHOL 160 07/17/2022 0903   TRIG 116 04/19/2023 0437   HDL 54 04/19/2023 0437   HDL 69 07/17/2022 0903   CHOLHDL 2.1 04/19/2023 0437   VLDL 23 04/19/2023 0437   LDLCALC 39 04/19/2023 0437   LDLCALC 70 07/17/2022 0903    PHYSICAL EXAM:    VS:  BP (!) 152/66 (BP Location: Left Arm, Patient Position: Sitting, Cuff Size: Normal)   Pulse 82   Ht 5' (1.524 m)   Wt 139 lb 9.6 oz (63.3 kg)   SpO2 98%   BMI 27.26 kg/m   BMI: Body  mass index is 27.26 kg/m.  Physical Exam Vitals reviewed.  Constitutional:      Appearance: She is well-developed.  HENT:     Head: Normocephalic and atraumatic.  Eyes:     General:        Right eye: No discharge.        Left eye: No discharge.  Neck:     Vascular: No JVD.  Cardiovascular:     Rate and Rhythm: Normal rate and regular rhythm.     Pulses:          Posterior tibial pulses are 2+ on the right side and 2+ on the left side.     Heart sounds: Normal heart sounds, S1 normal and S2 normal. Heart sounds not distant. No midsystolic click and no opening snap. No murmur heard.    No friction rub.  Pulmonary:     Effort: Pulmonary effort is normal. No respiratory distress.     Breath sounds: Normal breath sounds. No decreased breath sounds, wheezing, rhonchi or rales.  Chest:     Chest wall: No tenderness.   Abdominal:     General: There is no distension.  Musculoskeletal:     Cervical back: Normal range of motion.     Right lower leg: No edema.     Left lower leg: No edema.  Skin:    General: Skin is warm and dry.     Nails: There is no clubbing.  Neurological:     Mental Status: She is alert and oriented to person, place, and time.  Psychiatric:        Speech: Speech normal.        Behavior: Behavior normal.        Thought Content: Thought content normal.        Judgment: Judgment normal.     Wt Readings from Last 3 Encounters:  10/03/23 139 lb 9.6 oz (63.3 kg)  09/27/23 141 lb 12.8 oz (64.3 kg)  09/10/23 142 lb 12.8 oz (64.8 kg)     ASSESSMENT & PLAN:   CAD involving native coronary arteries with exertional dyspnea concerning for anginal equivalent: She continues to note exertional dyspnea throughout most of this year with brief improvement in dyspnea following prior PCI.  Exertional fatigue is improved with dosage adjustment of levothyroxine.  She has undergone extensive noninvasive cardiac testing as well as pulmonology evaluation, though these results have been unrevealing.  She continues to report "something is wrong."  At this point, we have agreed to proceed with [facility] for further cardiac evaluation.  Patient's functional status limits utility of his CPX.  Continue aggressive risk factor modification and secondary prevention including aspirin, clopidogrel, amlodipine, and resumption of rosuvastatin as outlined below.  HTN: Blood pressure is mildly elevated in the office today, though typically well-controlled.  She remains on amlodipine, spironolactone, and valsartan.  Not on beta-blocker secondary to prior fatigue and bradycardic rates.  Moving forward, may need to initiate low-dose carvedilol as a rechallenge.  HLD: LDL 39 in 04/2023 with normal AST/ALT in 06/2023.  Previously taken off rosuvastatin with slight improvement in fatigue, though since that she has had dosage  adjustment of her levothyroxine with improvement in fatigue we will rechallenge her with rosuvastatin 20 mg.  Follow-up fasting lipid panel and LFT in 2 months.  PVCs: Rare isolated PVC noted on EKG.  No longer on beta-blocker with noted fatigue and prior bradycardia.  Carotid artery stenosis: Status post left TCAR in 04/2023 with ultrasound in 05/2023 showing  patent stent with no obvious evidence of stenosis.  Notes ongoing left-sided neck discomfort and decreased range of motion since procedure.  Recommend follow-up by vascular surgery.     History of CVA: No new deficits.  Remains on aspirin and clopidogrel.  Reinitiating rosuvastatin as outlined above.  History of GI bleed/iron deficiency anemia: No symptoms concerning for bleeding.  Recent Hgb stable.  CKD stage III: Stable on most recent check.   Informed Consent   Shared Decision Making/Informed Consent{  The risks [stroke (1 in 1000), death (1 in 1000), kidney failure [usually temporary] (1 in 500), bleeding (1 in 200), allergic reaction [possibly serious] (1 in 200)], benefits (diagnostic support and management of coronary artery disease) and alternatives of a cardiac catheterization were discussed in detail with Ms. Olivos and she is willing to proceed.        Disposition: F/u with Dr. Kirke Corin or an APP 1-2 weeks after [facility].   Medication Adjustments/Labs and Tests Ordered: Current medicines are reviewed at length with the patient today.  Concerns regarding medicines are outlined above. Medication changes, Labs and Tests ordered today are summarized above and listed in the Patient Instructions accessible in Encounters.   Signed, Eula Listen, PA-C 10/03/2023 5:20 PM     [facility] [address] [place], [place] [address] [phone]

## 2023-10-03 ENCOUNTER — Ambulatory Visit: Payer: Medicare Other | Attending: Physician Assistant | Admitting: Physician Assistant

## 2023-10-03 ENCOUNTER — Encounter: Payer: Self-pay | Admitting: Physician Assistant

## 2023-10-03 VITALS — BP 152/66 | HR 82 | Ht 60.0 in | Wt 139.6 lb

## 2023-10-03 DIAGNOSIS — E785 Hyperlipidemia, unspecified: Secondary | ICD-10-CM | POA: Diagnosis not present

## 2023-10-03 DIAGNOSIS — R0609 Other forms of dyspnea: Secondary | ICD-10-CM | POA: Diagnosis not present

## 2023-10-03 DIAGNOSIS — Z8673 Personal history of transient ischemic attack (TIA), and cerebral infarction without residual deficits: Secondary | ICD-10-CM

## 2023-10-03 DIAGNOSIS — I6523 Occlusion and stenosis of bilateral carotid arteries: Secondary | ICD-10-CM

## 2023-10-03 DIAGNOSIS — N183 Chronic kidney disease, stage 3 unspecified: Secondary | ICD-10-CM | POA: Diagnosis not present

## 2023-10-03 DIAGNOSIS — I2089 Other forms of angina pectoris: Secondary | ICD-10-CM | POA: Insufficient documentation

## 2023-10-03 DIAGNOSIS — D508 Other iron deficiency anemias: Secondary | ICD-10-CM | POA: Diagnosis not present

## 2023-10-03 DIAGNOSIS — I493 Ventricular premature depolarization: Secondary | ICD-10-CM

## 2023-10-03 DIAGNOSIS — I10 Essential (primary) hypertension: Secondary | ICD-10-CM

## 2023-10-03 DIAGNOSIS — I25118 Atherosclerotic heart disease of native coronary artery with other forms of angina pectoris: Secondary | ICD-10-CM

## 2023-10-03 DIAGNOSIS — Z8719 Personal history of other diseases of the digestive system: Secondary | ICD-10-CM | POA: Diagnosis not present

## 2023-10-03 MED ORDER — ROSUVASTATIN CALCIUM 20 MG PO TABS: 20.0000 mg | ORAL_TABLET | Freq: Every day | ORAL | 3 refills | Status: DC

## 2023-10-03 NOTE — Patient Instructions (Addendum)
Medication Instructions:  Your physician recommends the following medication changes.  START TAKING: Crestor 20 mg daily  *If you need a refill on your cardiac medications before your next appointment, please call your pharmacy*   Lab Work: Your provider would like for you to have following labs drawn today CBC and BMP.   If you have labs (blood work) drawn today and your tests are completely normal, you will receive your results only by: MyChart Message (if you have MyChart) OR A paper copy in the mail If you have any lab test that is abnormal or we need to change your treatment, we will call you to review the results.   Testing/Procedures:  [place] A DEPT OF [facility] Dept: [phone] Loc: [phone]  Andrea Hart  10/03/2023  You are scheduled for a Cardiac Catheterization on Monday, November 11 with Dr. Tonny Bollman.  1. Please arrive at the [facility] (Main Entrance A) at [facility]: [address] [place], [place] [address] at 9:00 AM (This time is 2 hour(s) before your procedure to ensure your preparation). Free valet parking service is available. You will check in at ADMITTING. The support person will be asked to wait in the waiting room.  It is OK to have someone drop you off and come back when you are ready to be discharged.    Special note: Every effort is made to have your procedure done on time. Please understand that emergencies sometimes delay scheduled procedures.  2. Diet: Do not eat solid foods after midnight.  The patient may have clear liquids until 5am upon the day of the procedure.  3. Labs: You will need to have blood drawn today or tomorrow.  You do not need to be fasting.  4. Medication instructions in preparation for your procedure:   Contrast Allergy: No  HOLD your VALSARTAN on November 10 and 11th   On the morning of your procedure, take your Aspirin 81 mg and Plavix/Clopidogrel and any morning  medicines NOT listed above.  You may use sips of water.  5. Plan to go home the same day, you will only stay overnight if medically necessary. 6. Bring a current list of your medications and current insurance cards. 7. You MUST have a responsible person to drive you home. 8. Someone MUST be with you the first 24 hours after you arrive home or your discharge will be delayed. 9. Please wear clothes that are easy to get on and off and wear slip-on shoes.  Thank you for allowing [place] to care for you!   --  Invasive Cardiovascular services    Follow-Up: At [facility], you and your health needs are our priority.  As part of our continuing mission to provide you with exceptional heart care, we have created designated Provider Care Teams.  These Care Teams include your primary Cardiologist (physician) and Advanced Practice Providers (APPs -  Physician Assistants and Nurse Practitioners) who all work together to provide you with the care you need, when you need it.  We recommend signing up for the patient portal called "MyChart".  Sign up information is provided on this After Visit Summary.  MyChart is used to connect with patients for Virtual Visits (Telemedicine).  Patients are able to view lab/test results, encounter notes, upcoming appointments, etc.  Non-urgent messages can be sent to your provider as well.   To learn more about what you can do with MyChart, go to [email or web address].  Your next appointment:   1-2  week(s) after cath on 11 November  Provider:   You may see Lorine Bears, MD or one of the following Advanced Practice Providers on your designated Care Team:   Eula Listen, [place]

## 2023-10-04 DIAGNOSIS — Z01818 Encounter for other preprocedural examination: Secondary | ICD-10-CM | POA: Diagnosis not present

## 2023-10-05 LAB — CBC
Hematocrit: 34.2 % (ref 34.0–46.6)
Hemoglobin: 11.5 g/dL (ref 11.1–15.9)
MCH: 30.7 pg (ref 26.6–33.0)
MCHC: 33.6 g/dL (ref 31.5–35.7)
MCV: 91 fL (ref 79–97)
Platelets: 288 10*3/uL (ref 150–450)
RBC: 3.74 x10E6/uL — ABNORMAL LOW (ref 3.77–5.28)
RDW: 12.6 % (ref 11.7–15.4)
WBC: 9.9 10*3/uL (ref 3.4–10.8)

## 2023-10-05 LAB — BASIC METABOLIC PANEL
BUN/Creatinine Ratio: 15 (ref 12–28)
BUN: 20 mg/dL (ref 8–27)
CO2: 23 mmol/L (ref 20–29)
Calcium: 9.3 mg/dL (ref 8.7–10.3)
Chloride: 104 mmol/L (ref 96–106)
Creatinine, Ser: 1.32 mg/dL — ABNORMAL HIGH (ref 0.57–1.00)
Glucose: 122 mg/dL — ABNORMAL HIGH (ref 70–99)
Potassium: 3.9 mmol/L (ref 3.5–5.2)
Sodium: 142 mmol/L (ref 134–144)
eGFR: 40 mL/min/{1.73_m2} — ABNORMAL LOW (ref >59)

## 2023-10-07 ENCOUNTER — Other Ambulatory Visit: Payer: Self-pay | Admitting: Physician Assistant

## 2023-10-09 ENCOUNTER — Telehealth: Payer: Self-pay | Admitting: *Deleted

## 2023-10-09 NOTE — Telephone Encounter (Signed)
Cardiac Catheterization scheduled at [facility] for: Monday October 15, 2023 12 Noon Arrival time [facility] Main Entrance A at: 7 AM pre-procedure hydration per protocol GFR < 45 (40)  Nothing to eat after midnight prior to procedure, clear liquids until 5 AM day of procedure.  Medication instructions: -Hold:  Valsartan and Spironolactone the day before and day of procedure -per protocol GFR <60 (40) -Other usual morning medications can be taken with sips of water including aspirin 81 mg and Plavix 75 mg.  Plan to go home the same day, you will only stay overnight if medically necessary.  You must have responsible adult to drive you home.  Someone must be with you the first 24 hours after you arrive home.   Reviewed procedure instructions,discussed pre-procedure hydration with patient.

## 2023-10-11 NOTE — Telephone Encounter (Signed)
Per Eula Listen, PA-IV flow rate for hydration 33ml/hr.

## 2023-10-15 ENCOUNTER — Encounter (HOSPITAL_COMMUNITY)
Admission: RE | Disposition: A | Discharge status: Home / Self Care | Payer: Self-pay | Source: Home / Self Care | Attending: Cardiovascular Disease

## 2023-10-15 ENCOUNTER — Other Ambulatory Visit: Payer: Self-pay

## 2023-10-15 ENCOUNTER — Ambulatory Visit (HOSPITAL_COMMUNITY)
Admission: RE | Admit: 2023-10-15 | Discharge: 2023-10-15 | Disposition: A | Discharge status: Home / Self Care | Payer: Medicare Other | Attending: Cardiovascular Disease | Admitting: Cardiovascular Disease

## 2023-10-15 DIAGNOSIS — E039 Hypothyroidism, unspecified: Secondary | ICD-10-CM | POA: Diagnosis not present

## 2023-10-15 DIAGNOSIS — Z79899 Other long term (current) drug therapy: Secondary | ICD-10-CM | POA: Insufficient documentation

## 2023-10-15 DIAGNOSIS — E785 Hyperlipidemia, unspecified: Secondary | ICD-10-CM | POA: Insufficient documentation

## 2023-10-15 DIAGNOSIS — J45909 Unspecified asthma, uncomplicated: Secondary | ICD-10-CM | POA: Insufficient documentation

## 2023-10-15 DIAGNOSIS — Z7902 Long term (current) use of antithrombotics/antiplatelets: Secondary | ICD-10-CM | POA: Diagnosis not present

## 2023-10-15 DIAGNOSIS — N183 Chronic kidney disease, stage 3 unspecified: Secondary | ICD-10-CM | POA: Diagnosis not present

## 2023-10-15 DIAGNOSIS — Y832 Surgical operation with anastomosis, bypass or graft as the cause of abnormal reaction of the patient, or of later complication, without mention of misadventure at the time of the procedure: Secondary | ICD-10-CM | POA: Insufficient documentation

## 2023-10-15 DIAGNOSIS — I129 Hypertensive chronic kidney disease with stage 1 through stage 4 chronic kidney disease, or unspecified chronic kidney disease: Secondary | ICD-10-CM | POA: Diagnosis not present

## 2023-10-15 DIAGNOSIS — I2510 Atherosclerotic heart disease of native coronary artery without angina pectoris: Secondary | ICD-10-CM

## 2023-10-15 DIAGNOSIS — I2584 Coronary atherosclerosis due to calcified coronary lesion: Secondary | ICD-10-CM | POA: Diagnosis not present

## 2023-10-15 DIAGNOSIS — Z955 Presence of coronary angioplasty implant and graft: Secondary | ICD-10-CM | POA: Insufficient documentation

## 2023-10-15 DIAGNOSIS — I6523 Occlusion and stenosis of bilateral carotid arteries: Secondary | ICD-10-CM | POA: Diagnosis not present

## 2023-10-15 DIAGNOSIS — R0602 Shortness of breath: Secondary | ICD-10-CM | POA: Insufficient documentation

## 2023-10-15 DIAGNOSIS — I493 Ventricular premature depolarization: Secondary | ICD-10-CM | POA: Diagnosis not present

## 2023-10-15 DIAGNOSIS — Z8673 Personal history of transient ischemic attack (TIA), and cerebral infarction without residual deficits: Secondary | ICD-10-CM | POA: Diagnosis not present

## 2023-10-15 DIAGNOSIS — T82855A Stenosis of coronary artery stent, initial encounter: Secondary | ICD-10-CM | POA: Diagnosis not present

## 2023-10-15 DIAGNOSIS — I25110 Atherosclerotic heart disease of native coronary artery with unstable angina pectoris: Secondary | ICD-10-CM | POA: Diagnosis not present

## 2023-10-15 DIAGNOSIS — Z7982 Long term (current) use of aspirin: Secondary | ICD-10-CM | POA: Insufficient documentation

## 2023-10-15 HISTORY — PX: RIGHT HEART CATH AND CORONARY ANGIOGRAPHY: CATH118264

## 2023-10-15 LAB — POCT I-STAT EG7
Acid-base deficit: 2 mmol/L (ref 0.0–2.0)
Bicarbonate: 22.9 mmol/L (ref 20.0–28.0)
Calcium, Ion: 1.22 mmol/L (ref 1.15–1.40)
HCT: 27 % — ABNORMAL LOW (ref 36.0–46.0)
Hemoglobin: 9.2 g/dL — ABNORMAL LOW (ref 12.0–15.0)
O2 Saturation: 64 %
Potassium: 3.4 mmol/L — ABNORMAL LOW (ref 3.5–5.1)
Sodium: 141 mmol/L (ref 135–145)
TCO2: 24 mmol/L (ref 22–32)
pCO2, Ven: 38.7 mm[Hg] — ABNORMAL LOW (ref 44–60)
pH, Ven: 7.379 (ref 7.25–7.43)
pO2, Ven: 34 mm[Hg] (ref 32–45)

## 2023-10-15 LAB — POCT I-STAT 7, (LYTES, BLD GAS, ICA,H+H)
Acid-base deficit: 2 mmol/L (ref 0.0–2.0)
Bicarbonate: 22 mmol/L (ref 20.0–28.0)
Calcium, Ion: 1.26 mmol/L (ref 1.15–1.40)
HCT: 27 % — ABNORMAL LOW (ref 36.0–46.0)
Hemoglobin: 9.2 g/dL — ABNORMAL LOW (ref 12.0–15.0)
O2 Saturation: 93 %
Potassium: 3.5 mmol/L (ref 3.5–5.1)
Sodium: 140 mmol/L (ref 135–145)
TCO2: 23 mmol/L (ref 22–32)
pCO2 arterial: 35 mm[Hg] (ref 32–48)
pH, Arterial: 7.407 (ref 7.35–7.45)
pO2, Arterial: 66 mm[Hg] — ABNORMAL LOW (ref 83–108)

## 2023-10-15 SURGERY — RIGHT HEART CATH AND CORONARY ANGIOGRAPHY
Anesthesia: LOCAL

## 2023-10-15 MED ORDER — ONDANSETRON HCL 4 MG/2ML IJ SOLN: 4.0000 mg | Freq: Four times a day (QID) | INTRAMUSCULAR | Status: DC | PRN

## 2023-10-15 MED ORDER — SODIUM CHLORIDE 0.9 % IV SOLN: INTRAVENOUS | Status: DC

## 2023-10-15 MED ORDER — MIDAZOLAM HCL 2 MG/2ML IJ SOLN
INTRAMUSCULAR | Status: DC | PRN
  Administered 2023-10-15: 2 mg via INTRAVENOUS

## 2023-10-15 MED ORDER — LIDOCAINE HCL (PF) 1 % IJ SOLN
INTRAMUSCULAR | Status: DC | PRN
  Administered 2023-10-15: 15 mL

## 2023-10-15 MED ORDER — IOHEXOL 350 MG/ML SOLN
INTRAVENOUS | Status: DC | PRN
  Administered 2023-10-15: 35 mL

## 2023-10-15 MED ORDER — SODIUM CHLORIDE 0.9% FLUSH: 3.0000 mL | Freq: Two times a day (BID) | INTRAVENOUS | Status: DC

## 2023-10-15 MED ORDER — FENTANYL CITRATE (PF) 100 MCG/2ML IJ SOLN
INTRAMUSCULAR | Status: DC | PRN
  Administered 2023-10-15: 25 ug via INTRAVENOUS

## 2023-10-15 MED ORDER — FENTANYL CITRATE (PF) 100 MCG/2ML IJ SOLN
INTRAMUSCULAR | Status: AC
  Filled 2023-10-15: qty 2

## 2023-10-15 MED ORDER — HEPARIN (PORCINE) IN NACL 1000-0.9 UT/500ML-% IV SOLN
INTRAVENOUS | Status: DC | PRN
  Administered 2023-10-15 (×2): 500 mL

## 2023-10-15 MED ORDER — SODIUM CHLORIDE 0.9% FLUSH: 3.0000 mL | INTRAVENOUS | Status: DC | PRN

## 2023-10-15 MED ORDER — ASPIRIN 81 MG PO CHEW: 81.0000 mg | CHEWABLE_TABLET | ORAL | Status: DC

## 2023-10-15 MED ORDER — CLOPIDOGREL BISULFATE 75 MG PO TABS: 75.0000 mg | ORAL_TABLET | ORAL | Status: DC

## 2023-10-15 MED ORDER — ACETAMINOPHEN 325 MG PO TABS: 650.0000 mg | ORAL_TABLET | ORAL | Status: DC | PRN

## 2023-10-15 MED ORDER — SODIUM CHLORIDE 0.9 % IV SOLN: 250.0000 mL | INTRAVENOUS | Status: DC | PRN

## 2023-10-15 MED ORDER — MIDAZOLAM HCL 2 MG/2ML IJ SOLN
INTRAMUSCULAR | Status: AC
  Filled 2023-10-15: qty 2

## 2023-10-15 MED ORDER — HYDRALAZINE HCL 20 MG/ML IJ SOLN: 10.0000 mg | INTRAMUSCULAR | Status: DC | PRN

## 2023-10-15 MED ORDER — LABETALOL HCL 5 MG/ML IV SOLN: 10.0000 mg | INTRAVENOUS | Status: DC | PRN

## 2023-10-15 SURGICAL SUPPLY — 14 items
CATH INFINITI 5FR MULTPACK ANG (CATHETERS) IMPLANT
CATH SWAN GANZ 7F STRAIGHT (CATHETERS) IMPLANT
CLOSURE MYNX CONTROL 5F (Vascular Products) IMPLANT
CLOSURE MYNX CONTROL 6F/7F (Vascular Products) IMPLANT
GLIDESHEATH SLEND SS 6F .021 (SHEATH) IMPLANT
GUIDEWIRE INQWIRE 1.5J.035X260 (WIRE) IMPLANT
INQWIRE 1.5J .035X260CM (WIRE) ×1
KIT MICROPUNCTURE NIT STIFF (SHEATH) IMPLANT
PACK CARDIAC CATHETERIZATION (CUSTOM PROCEDURE TRAY) ×1 IMPLANT
SET ATX-X65L (MISCELLANEOUS) IMPLANT
SHEATH PINNACLE 5F 10CM (SHEATH) IMPLANT
SHEATH PINNACLE 7F 10CM (SHEATH) IMPLANT
SHEATH PROBE COVER 6X72 (BAG) IMPLANT
WIRE EMERALD 3MM-J .035X150CM (WIRE) IMPLANT

## 2023-10-15 NOTE — Progress Notes (Signed)
Pt ambulated to and from bathroom to void with no signs of oozing from right groin site  

## 2023-10-15 NOTE — Discharge Instructions (Signed)
Femoral Site Care This sheet gives you information about how to care for yourself after your procedure. Your health care provider may also give you more specific instructions. If you have problems or questions, contact your health care provider. What can I expect after the procedure?  After the procedure, it is common to have: Bruising that usually fades within 1-2 weeks. Tenderness at the site. Follow these instructions at home: Wound care Follow instructions from your health care provider about how to take care of your insertion site. Make sure you: Wash your hands with soap and water before you change your bandage (dressing). If soap and water are not available, use hand sanitizer. Remove your dressing as told by your health care provider. 24 hours Do not take baths, swim, or use a hot tub until your health care provider approves. You may shower 24-48 hours after the procedure or as told by your health care provider. Gently wash the site with plain soap and water. Pat the area dry with a clean towel. Do not rub the site. This may cause bleeding. Do not apply powder or lotion to the site. Keep the site clean and dry. Check your femoral site every day for signs of infection. Check for: Redness, swelling, or pain. Fluid or blood. Warmth. Pus or a bad smell. Activity For the first 2-3 days after your procedure, or as long as directed: Avoid climbing stairs as much as possible. Do not squat. Do not lift anything that is heavier than 10 lb (4.5 kg), or the limit that you are told, until your health care provider says that it is safe. For 5 days Rest as directed. Avoid sitting for a long time without moving. Get up to take short walks every 1-2 hours. Do not drive for 24 hours if you were given a medicine to help you relax (sedative). General instructions Take over-the-counter and prescription medicines only as told by your health care provider. Keep all follow-up visits as told by your  health care provider. This is important. Contact a health care provider if you have: A fever or chills. You have redness, swelling, or pain around your insertion site. Get help right away if: The catheter insertion area swells very fast. You pass out. You suddenly start to sweat or your skin gets clammy. The catheter insertion area is bleeding, and the bleeding does not stop when you hold steady pressure on the area. The area near or just beyond the catheter insertion site becomes pale, cool, tingly, or numb. These symptoms may represent a serious problem that is an emergency. Do not wait to see if the symptoms will go away. Get medical help right away. Call your local emergency services (911 in the [place] Do not drive yourself to the hospital. Summary After the procedure, it is common to have bruising that usually fades within 1-2 weeks. Check your femoral site every day for signs of infection. Do not lift anything that is heavier than 10 lb (4.5 kg), or the limit that you are told, until your health care provider says that it is safe. This information is not intended to replace advice given to you by your health care provider. Make sure you discuss any questions you have with your health care provider. Document Revised: 12/03/2017 Document Reviewed: 12/03/2017 Elsevier Patient Education  2020 [employer]  

## 2023-10-15 NOTE — Interval H&P Note (Signed)
History and Physical Interval Note:  10/15/2023 11:18 AM  Andrea Hart  has presented today for surgery, with the diagnosis of unstable angina.  The various methods of treatment have been discussed with the patient and family. After consideration of risks, benefits and other options for treatment, the patient has consented to  Procedure(s): RIGHT/LEFT HEART CATH AND CORONARY ANGIOGRAPHY (N/A) as a surgical intervention.  The patient's history has been reviewed, patient examined, no change in status, stable for surgery.  I have reviewed the patient's chart and labs.  Questions were answered to the patient's satisfaction.     Tonny Bollman

## 2023-10-16 ENCOUNTER — Encounter (HOSPITAL_COMMUNITY): Payer: Self-pay | Admitting: Cardiovascular Disease

## 2023-10-23 NOTE — Progress Notes (Unsigned)
Cardiology Office Note    Date:  10/24/2023   ID:  Andrea Hart, DOB 1936/01/22, MRN [id number]  PCP:  Erasmo Downer, MD  Cardiologist:  Lorine Bears, MD  Electrophysiologist:  None   Chief Complaint: Follow-up  History of Present Illness:   Andrea Hart is a 87 y.o. female with history of CAD status post stenting in 2013 to the RCA status post PCI/DES to the RCA overlapping the previously placed stent on 05/24/2022, CKD stage III, prior CVA x2, HTN, HLD, carotid artery disease status post left TCAR in 04/2023, GI bleeding in 02/2021, PVCs, hypothyroidism, and asthma who presents for follow-up of R/LHC.   She underwent prior coronary stenting in [place], [place] in 2013.  In 2017, she suffered a stroke.  ILR did not show any evidence of A-fib, and she has been maintained on aspirin and clopidogrel since.  In 01/2016, she complained of presyncope and underwent event monitoring.  This showed frequent PVCs with a 10% burden.  She reported a long history of PVCs and has been managed with beta-blocker therapy.  She has history of difficult to control hypertension with renal artery ultrasound in 07/2020 showing no evidence of RAS.  Carotid artery ultrasound in 11/2020 showed moderate, nonobstructive bilateral left greater than right ICA disease.  She was admitted in 02/2021 with rectal bleeding with GI feeling the presentation was most consistent with an acute diverticular bleed.  Aspirin and clopidogrel were held and she underwent outpatient colonoscopy in 04/2021 which demonstrated nonbleeding internal hemorrhoids, moderate diverticulosis without evidence of diverticular bleeding.  She was subsequently resumed on aspirin and clopidogrel.  She was seen in the office in 02/2022 and was without symptoms of angina or decompensation.  She remained active with multiple social events.  Carotid artery ultrasound from 02/2022 demonstrated 1 to 39% RICA stenosis and 60 to 79% LICA stenosis (previously 50 to 69%).   She was seen on 05/16/2022 noting a 9 to 28-month history of exertional dyspnea that had become more pronounced.  Symptoms felt similar, though not as severe, to her prior angina leading up to PCI in 2013.  She was without frank chest pain.  Given concerning symptoms, she underwent LHC on 05/24/2022 which showed moderately to severely calcified coronary arteries with significant one-vessel CAD.  There was 95% stenosis in the proximal/mid RCA just before the previously placed stent.  LVEDP was upper limit of normal.  She underwent successful PCI/DES to the RCA overlapping with the previously placed stent.  Post-cath labs notable for a hemoglobin of 9.4.  Carvedilol was decreased secondary to bradycardia.     She was seen in the office on 05/30/2022 and noted improvement in her dyspnea following PCI to the RCA.  She was frustrated as she continued to be fatigued, though this is subsequently improved with cardiac rehab.  Bradycardia improved following the tapering of carvedilol.  Echo on 06/22/2022 demonstrated an EF of 60 to 65%, no regional wall motion abnormalities, moderate LVH, grade 1 diastolic dysfunction, normal RV systolic function and ventricular cavity size, mildly dilated left atrium, mild mitral regurgitation, aortic valve sclerosis without evidence of stenosis, and an estimated right atrial pressure of 3 mmHg.     She was subsequently rechallenged on carvedilol due to elevated BPs, though again reported intolerance leading to its discontinuation.     Carotid artery ultrasound from 04/10/2023 showed showed 80 to 99% left ICA stenosis with 1 to 39% right ICA stenosis.  This was a progression of the  left ICA stenosis that was previously noted to be 60 to 79% in 02/2022.  Subsequent CTA of the head/neck on 04/12/2023 demonstrated 75% stenosis of the proximal left ICA just beyond the bifurcation as well as atherosclerotic calcification at the right carotid bifurcation and internal carotid arteries without  significant stenosis.  She was evaluated by vascular surgery and underwent left TCAR on 04/18/2023 for asymptomatic critical ICA stenosis.  Follow-up carotid artery ultrasound on 05/28/2023 showed 1 to 39% right ICA stenosis with patent left ICA stenosis without obvious evidence of stenosis.   She was seen in the office on 06/22/2023 noting an increase in exertional fatigue and dyspnea that became more noticeable following TCAR.  She was without frank chest pain.  Lexiscan MPI on 06/28/2023 showed no significant ischemia with an EF of 70%.  CT attenuation corrected images showed three-vessel coronary artery calcification and aortic atherosclerosis.  Overall, this was a low risk scan.  Echo on 07/25/2023 showed an EF of 60 to 65%, no regional wall motion abnormalities, mild LVH, grade 1 diastolic dysfunction, normal RV systolic function and ventricular cavity size, moderately dilated left atrium, mild mitral regurgitation with moderate mitral annular calcification, aortic valve sclerosis without evidence of stenosis, and an estimated right atrial pressure of 3 mmHg.   She was seen in the office on 08/03/2023 and continued to feel about the same as she had over the prior several visits, though a little improved off statin therapy.  She continued to note exertional shortness of breath and fatigue without frank angina.  No evidence of volume overload.  She was subsequently evaluated by pulmonology with PFTs showing normal spirometry without evidence of obstruction, restriction and with normal lung volumes.  DLCO was mildly reduced and felt to possibly be related to her underlying anemia.  Double contrast esophagogram did not show evidence of hiatal hernia.  CPX was declined.  No further pulmonology workup was indicated.  She was last seen in the office on 10/03/2023 and continued to feel the same as she had over prior several visits, albeit with some improved fatigue following adjustment of levothyroxine.  He did  continue to note exertional dyspnea, without frank chest pain.  In this setting, she underwent R/LHC on 10/15/2023 showed patent coronary arteries with diffuse LAD and RCA calcification, mild nonobstructive plaquing in the LAD and LCx, and patent stents in the RCA with only mild in-stent restenosis.  Essentially normal RHC with preserved cardiac output.  The patient was well compensated from a cardiac perspective with no evidence of high-grade obstructive CAD.  She comes in accompanied today by family and feels the same as she has over her last several visits with ongoing fatigue and shortness of breath.  No frank chest pain.  No dizziness, presyncope, or syncope.  No lower extremity swelling or progressive orthopnea.  Weight is stable.  No falls or symptoms concerning for bleeding.   Labs independently reviewed: 09/2023 - Hgb 11.5, PLT 288, BUN 20, serum creatinine 1.32, potassium 3.9, magnesium 1.7, albumin 4.2, AST/ALT normal, TSH 6.290 08/2023 - Free T4 normal 04/2023 - TC 160, TG 116, HDL 54, LDL 39  Past Medical History:  Diagnosis Date   Abdominal hernia    Arthritis    Asthma    Carotid arterial disease (HCC)    a. 11/2020 Carotid U/S: Less than 50% RICA, 50-69% LICA.   CKD (chronic kidney disease), stage III (HCC)    Coronary artery disease    a. 2013 s/p PCI and stent placement  in [place] done for stable angina.   GI bleeding 02/02/2021   History of blood transfusion    History of hiatal hernia    had  surgery   Hyperlipidemia    Hypertension    Hypothyroidism    Pneumonia    years ago   PVC's (premature ventricular contractions)    a. 01/2020 Zio: RSR, 70, frequent PVCs with 10.5% burden.   Stroke [facility])    a. 2017 - ILR did not show afib.    Past Surgical History:  Procedure Laterality Date   ABDOMINAL HYSTERECTOMY     APPENDECTOMY     CATARACT EXTRACTION Bilateral    CHOLECYSTECTOMY     COLONOSCOPY WITH PROPOFOL N/A 04/06/2021   Procedure:  COLONOSCOPY WITH PROPOFOL;  Surgeon: Toledo, Boykin Nearing, MD;  Location: [facility] ENDOSCOPY;  Service: Gastroenterology;  Laterality: N/A;   CORONARY ANGIOPLASTY  2013   1xStent [facility] [place] .   CORONARY STENT INTERVENTION N/A 05/24/2022   Procedure: CORONARY STENT INTERVENTION;  Surgeon: Iran Ouch, MD;  Location: [facility] INVASIVE CV LAB;  Service: Cardiovascular;  Laterality: N/A;   EYE SURGERY     HAND SURGERY Right    LEFT HEART CATH AND CORONARY ANGIOGRAPHY Left 05/24/2022   Procedure: LEFT HEART CATH AND CORONARY ANGIOGRAPHY;  Surgeon: Iran Ouch, MD;  Location: [facility] INVASIVE CV LAB;  Service: Cardiovascular;  Laterality: Left;   LOOP RECORDER IMPLANT     NISSEN FUNDOPLICATION  2016   REPLACEMENT TOTAL KNEE Bilateral 2017   RIGHT HEART CATH AND CORONARY ANGIOGRAPHY N/A 10/15/2023   Procedure: RIGHT HEART CATH AND CORONARY ANGIOGRAPHY;  Surgeon: Tonny Bollman, MD;  Location: [facility] INVASIVE CV LAB;  Service: Cardiovascular;  Laterality: N/A;   TOTAL HIP ARTHROPLASTY Right    TOTAL VAGINAL HYSTERECTOMY     TRANSCAROTID ARTERY REVASCULARIZATION  Left 04/18/2023   Procedure: LEFT Transcarotid Artery Revascularization;  Surgeon: Nada Libman, MD;  Location: [facility] OR;  Service: Vascular;  Laterality: Left;   ULTRASOUND GUIDANCE FOR VASCULAR ACCESS  04/18/2023   Procedure: ULTRASOUND GUIDANCE FOR VASCULAR ACCESS;  Surgeon: Nada Libman, MD;  Location: [facility] OR;  Service: Vascular;;    Current Medications: Current Meds  Medication Sig   acetaminophen (TYLENOL) 500 MG tablet Take 1,000 mg by mouth at bedtime.   albuterol (VENTOLIN HFA) 108 (90 Base) MCG/ACT inhaler Inhale 2 puffs into the lungs every 4 (four) hours as needed for wheezing or shortness of breath.   amLODipine (NORVASC) 10 MG tablet Take 1 tablet (10 mg total) by mouth daily.   aspirin EC 81 MG tablet Take 81 mg by mouth every evening.   budesonide-formoterol (SYMBICORT) 160-4.5 MCG/ACT inhaler Inhale 2 puffs into the  lungs 2 (two) times daily.   cetirizine (ZYRTEC) 10 MG tablet Take 1 tablet (10 mg total) by mouth daily. (Patient taking differently: Take 10 mg by mouth at bedtime.)   Cholecalciferol (VITAMIN D3) 125 MCG (5000 UT) TABS Take by mouth.   clobetasol (TEMOVATE) 0.05 % external solution Apply 1 Application topically in the morning and at bedtime.   clopidogrel (PLAVIX) 75 MG tablet Take 1 tablet (75 mg total) by mouth daily.   FeFum-FePoly-FA-B Cmp-C-Biot (FOLIVANE-PLUS) CAPS Take 1 capsule by mouth daily.   ketoconazole (NIZORAL) 2 % shampoo Apply 1 Application topically 2 (two) times a week.   levothyroxine (SYNTHROID) 88 MCG tablet Take 1 tablet (88 mcg total) by mouth daily.   methocarbamol (ROBAXIN) 500 MG tablet Take 1 tablet (500 mg  total) by mouth at bedtime as needed for muscle spasms.   montelukast (SINGULAIR) 10 MG tablet Take 1 tablet (10 mg total) by mouth at bedtime.   omeprazole (PRILOSEC) 40 MG capsule Take 1 capsule (40 mg total) by mouth 2 (two) times daily.   rosuvastatin (CRESTOR) 20 MG tablet Take 1 tablet (20 mg total) by mouth daily.   sodium bicarbonate 650 MG tablet Take 1,300 mg by mouth 2 (two) times daily.   triamcinolone cream (KENALOG) 0.1 % Apply 1 Application topically 2 (two) times daily as needed (psoriasis (elbows)).   TURMERIC PO Take 1 capsule by mouth daily with lunch. Qunol Joint Comfort With Turmeric Capsules   valsartan (DIOVAN) 320 MG tablet TAKE 1 TABLET BY MOUTH EVERY DAY   zinc sulfate 220 (50 Zn) MG capsule Take 220 mg by mouth daily with lunch.    Allergies:   Patient has no known allergies.   Social History   Socioeconomic History   Marital status: Widowed    Spouse name: Not on file   Number of children: 2   Years of education: Not on file   Highest education level: Some college, no degree  Occupational History    Comment: retired Photographer  Tobacco Use   Smoking status: Never   Smokeless tobacco: Never  Vaping Use   Vaping status: Never  Used  Substance and Sexual Activity   Alcohol use: Not Currently    Comment: Occasional   Drug use: Never   Sexual activity: Never    Birth control/protection: Surgical  Other Topics Concern   Not on file  Social History Narrative   Moved here in 2019, had been living in [place].    Born here, raised daughters here.    Lives with daughter now, [place].   Lost one daughter in car wreck.    Social Determinants of Health   Financial Resource Strain: Low Risk  (09/07/2023)   Overall Financial Resource Strain (CARDIA)    Difficulty of Paying Living Expenses: Not very hard  Food Insecurity: Food Insecurity Present (09/07/2023)   Hunger Vital Sign    Worried About Running Out of Food in the Last Year: Sometimes true    Ran Out of Food in the Last Year: Never true  Transportation Needs: No Transportation Needs (09/07/2023)   PRAPARE - Administrator, Civil Service (Medical): No    Lack of Transportation (Non-Medical): No  Physical Activity: Insufficiently Active (09/07/2023)   Exercise Vital Sign    Days of Exercise per Week: 3 days    Minutes of Exercise per Session: 20 min  Stress: No Stress Concern Present (09/07/2023)   [employer] of Occupational Health - Occupational Stress Questionnaire    Feeling of Stress : Only a little  Social Connections: Moderately Integrated (09/07/2023)   Social Connection and Isolation Panel [NHANES]    Frequency of Communication with Friends and Family: More than three times a week    Frequency of Social Gatherings with Friends and Family: More than three times a week    Attends Religious Services: More than 4 times per year    Active Member of [employer] or Organizations: Yes    Attends Banker Meetings: More than 4 times per year    Marital Status: Widowed     Family History:  The patient's family history includes Heart Problems in her brother, father, and mother; Heart attack in her brother and mother. There is no  history of Colon cancer or Breast  cancer.  ROS:   12-point review of systems is negative unless otherwise noted in the HPI.   EKGs/Labs/Other Studies Reviewed:    Studies reviewed were summarized above. The additional studies were reviewed today:  R/LHC 10/15/2023: 1.  Patent coronary arteries with diffuse LAD and RCA calcification, mild nonobstructive plaquing in the LAD and left circumflex, and patent stents in the RCA with only mild in-stent restenosis 2.  Essentially normal right heart catheterization with mean RA pressure 6 mmHg, PA pressure 33 over 15 mmHg, wedge pressure of 10 mmHg, and preserved cardiac output of 4.7 L/min.   Recommendations: Patient appears to be well compensated from a cardiac perspective.  She has no high-grade obstructive CAD.  Continue medical therapy. __________  2D echo 07/25/2023: 1. Left ventricular ejection fraction, by estimation, is 60 to 65%. The  left ventricle has normal function. The left ventricle has no regional  wall motion abnormalities. There is mild left ventricular hypertrophy.  Left ventricular diastolic parameters  are consistent with Grade I diastolic dysfunction (impaired relaxation).  The average left ventricular global longitudinal strain is -15.6 %.   2. Right ventricular systolic function is normal. The right ventricular  size is normal. Tricuspid regurgitation signal is inadequate for assessing  PA pressure.   3. Left atrial size was moderately dilated.   4. The mitral valve is normal in structure. Mild mitral valve  regurgitation. No evidence of mitral stenosis. Moderate mitral annular  calcification.   5. The aortic valve is normal in structure. There is mild calcification  of the aortic valve. Aortic valve regurgitation is not visualized. Aortic  valve sclerosis/calcification is present, without any evidence of aortic  stenosis.   6. The inferior vena cava is normal in size with greater than 50%  respiratory variability,  suggesting right atrial pressure of 3 mmHg.  __________   Eugenie Birks MPI 06/28/2023: Pharmacological myocardial perfusion imaging study with no significant  ischemia Normal wall motion, EF estimated at 70% No EKG changes concerning for ischemia at peak stress or in recovery. Left bundle branch block noted during infusion that by report returned to narrow complex QRS in recovery CT attenuation correction images with three-vessel coronary calcification, moderate aortic atherosclerosis Low risk scan __________   Carotid artery ultrasound 05/28/2023: Summary:  Right Carotid: Velocities in the right ICA are consistent with a 1-39% stenosis.   Left Carotid: The ECA appears >50% stenosed. Patent stent without obvious evidence of stenosis.   Vertebrals: Bilateral vertebral arteries demonstrate antegrade flow.  __________   Carotid artery ultrasound 04/10/2023: Summary:  Right Carotid: Velocities in the right ICA are consistent with a 1-39% stenosis.   Left Carotid: Velocities in the left ICA are consistent with a 80-99% stenosis. The ECA appears >50% stenosed.  __________   2D echo 06/22/2022: 1. Left ventricular ejection fraction, by estimation, is 60 to 65%. The  left ventricle has normal function. The left ventricle has no regional  wall motion abnormalities. There is moderate left ventricular hypertrophy.  Left ventricular diastolic  parameters are consistent with Grade I diastolic dysfunction (impaired  relaxation).   2. Right ventricular systolic function is normal. The right ventricular  size is normal.   3. Left atrial size was mildly dilated.   4. The mitral valve is normal in structure. Mild mitral valve  regurgitation. No evidence of mitral stenosis.   5. The aortic valve is normal in structure. Aortic valve regurgitation is  not visualized. Aortic valve sclerosis/calcification is present, without  any evidence of  aortic stenosis.   6. The inferior vena cava is normal in size  with greater than 50%  respiratory variability, suggesting right atrial pressure of 3 mmHg. __________   LHC 05/24/2022:   Mid RCA lesion is 10% stenosed.   Prox RCA to Mid RCA lesion is 95% stenosed.   Prox LAD to Mid LAD lesion is 20% stenosed.   A drug-eluting stent was successfully placed using a STENT ONYX FRONTIER [id number].   Post intervention, there is a 0% residual stenosis.   1.  Moderately to severely calcified coronary arteries with significant one-vessel coronary artery disease.   There is 95% stenosis in the proximal/mid right coronary artery just before the previously placed stent.   2.  Left ventricular angiography was not performed due to chronic kidney disease.  LVEDP was at the upper limit of normal. 3.  Successful angioplasty and drug-eluting stent placement to the right coronary artery overlapping with the previously placed stent. 4.  Difficult catheterization via the right radial artery due to significant tortuosity of the innominate and right subclavian arteries.  Consider alternative route in the future.   Recommendations: Dual antiplatelet therapy for at least 6 months. Aggressive treatment of risk factors. __________   Carotid artery ultrasound 02/23/2022: Summary:  Right Carotid: Velocities in the right ICA are consistent with a 1-39%  stenosis. Non-hemodynamically significant plaque <50% noted in the  CCA. The ECA appears <50% stenosed.   Left Carotid: Velocities in the left ICA are consistent with a 60-79%  stenosis. Non-hemodynamically significant plaque <50% noted in the  CCA. The ECA appears >50% stenosed.      The bilateral lobes of the thyroid had a globuated  appearance   Suggest follow up study in 12 months.  __________   Renal artery ultrasound 07/16/2020: Summary:  Largest Aortic Diameter: 2.0 cm     Renal:     Right: Normal size right kidney. Normal right Resisitive Index.         Normal cortical thickness of right kidney. No evidence of          right renal artery stenosis. RRV flow present.  Left:  LRV flow present. No evidence of left renal artery stenosis.         Normal size of left kidney. Normal left Resistive Index.         Normal cortical thickness of the left kidney. __________   Outpatient cardiac monitoring 01/2020: Normal sinus rhythm with an average heart rate of 70 bpm. 1 short run of SVT lasted 4 beats. Frequent PVCs with a burden of 10.5%.  Ventricular bigeminy and trigeminy were present.   EKG:  EKG is not ordered today.    Recent Labs: 09/10/2023: ALT 20; Magnesium 1.7; TSH 6.290 10/04/2023: BUN 20; Creatinine, Ser 1.32; Platelets 288 10/15/2023: Hemoglobin 9.2; Hemoglobin 9.2; Potassium 3.4; Potassium 3.5; Sodium 141; Sodium 140  Recent Lipid Panel    Component Value Date/Time   CHOL 116 04/19/2023 0437   CHOL 160 07/17/2022 0903   TRIG 116 04/19/2023 0437   HDL 54 04/19/2023 0437   HDL 69 07/17/2022 0903   CHOLHDL 2.1 04/19/2023 0437   VLDL 23 04/19/2023 0437   LDLCALC 39 04/19/2023 0437   LDLCALC 70 07/17/2022 0903    PHYSICAL EXAM:    VS:  BP (!) 144/80 (BP Location: Left Arm, Patient Position: Sitting, Cuff Size: Normal)   Pulse 77   Ht 5' (1.524 m)   Wt 143 lb 12.8 oz (65.2 kg)  SpO2 95%   BMI 28.08 kg/m   BMI: Body mass index is 28.08 kg/m.  Physical Exam Vitals reviewed.  Constitutional:      Appearance: She is well-developed.  HENT:     Head: Normocephalic and atraumatic.  Eyes:     General:        Right eye: No discharge.        Left eye: No discharge.  Neck:     Vascular: No JVD.  Cardiovascular:     Rate and Rhythm: Normal rate and regular rhythm.     Pulses:          Posterior tibial pulses are 2+ on the right side and 2+ on the left side.     Heart sounds: Normal heart sounds, S1 normal and S2 normal. Heart sounds not distant. No midsystolic click and no opening snap. No murmur heard.    No friction rub.     Comments: Right femoral arteriotomy site without  active bleeding, bruising, swelling, warmth, or erythema.  No bruit. Pulmonary:     Effort: Pulmonary effort is normal. No respiratory distress.     Breath sounds: Normal breath sounds. No decreased breath sounds, wheezing, rhonchi or rales.  Chest:     Chest wall: No tenderness.  Abdominal:     General: There is no distension.  Musculoskeletal:     Cervical back: Normal range of motion.     Right lower leg: No edema.     Left lower leg: No edema.  Skin:    General: Skin is warm and dry.     Nails: There is no clubbing.  Neurological:     Mental Status: She is alert and oriented to person, place, and time.  Psychiatric:        Speech: Speech normal.        Behavior: Behavior normal.        Thought Content: Thought content normal.        Judgment: Judgment normal.     Wt Readings from Last 3 Encounters:  10/24/23 143 lb 12.8 oz (65.2 kg)  10/15/23 140 lb (63.5 kg)  10/03/23 139 lb 9.6 oz (63.3 kg)     ASSESSMENT & PLAN:   CAD involving native coronary arteries with stable exertional dyspnea: She continues to note exertional fatigue and dyspnea.  Cardiac and pulmonary evaluations have been reassuring as outlined above.  She did note similar symptoms of following PCI in 05/2022 that improved with cardiac rehab.  We will try and reenroll her in cardiac or pulmonary rehab.  If this is unsuccessful would recommend she join a program such as [employer].  Family is in agreement that this will likely help with the patient's overall symptoms.  Continue aggressive risk factor modification and secondary prevention including aspirin, clopidogrel, amlodipine, and rosuvastatin.  No indication for further ischemic testing.  No plans for CPX at this time.  HTN: Blood pressure is mildly elevated in the office today, though typically well-controlled.  Continue amlodipine 10 mg, valsartan 320 mg, and spironolactone 25 mg.  Check BMP.  HLD: LDL 39 in 04/2023 and normal ALT/ALT in 09/2023.  Remains  on rosuvastatin 20 mg.  PVCs: Asymptomatic.  No longer on beta-blocker with noted fatigue and prior bradycardia.  Carotid artery stenosis: Status post left TCAR in 04/2023 with ultrasound in 05/2023 showing patent stent with no obvious evidence of stenosis.  Remains on aspirin, clopidogrel, and rosuvastatin as outlined above.  Followed by vascular surgery.  History of  CVA: No new deficits.  Remains on aspirin, clopidogrel, and rosuvastatin.  History of GI bleed/iron deficiency anemia: Check CBC, iron panel, and ferritin.  No symptoms of bleeding.  CKD stage III: Check BMP.    Disposition: F/u with Dr. Kirke Corin or an APP in 1 month.   Medication Adjustments/Labs and Tests Ordered: Current medicines are reviewed at length with the patient today.  Concerns regarding medicines are outlined above. Medication changes, Labs and Tests ordered today are summarized above and listed in the Patient Instructions accessible in Encounters.   Signed, Eula Listen, PA-C 10/24/2023 4:43 PM     [facility] - [place] [address] [place], [place] [address] [phone]

## 2023-10-24 ENCOUNTER — Ambulatory Visit: Payer: Medicare Other | Attending: Physician Assistant | Admitting: Physician Assistant

## 2023-10-24 ENCOUNTER — Encounter: Payer: Self-pay | Admitting: Physician Assistant

## 2023-10-24 VITALS — BP 144/80 | HR 77 | Ht 60.0 in | Wt 143.8 lb

## 2023-10-24 DIAGNOSIS — R0602 Shortness of breath: Secondary | ICD-10-CM

## 2023-10-24 DIAGNOSIS — D508 Other iron deficiency anemias: Secondary | ICD-10-CM

## 2023-10-24 DIAGNOSIS — I25118 Atherosclerotic heart disease of native coronary artery with other forms of angina pectoris: Secondary | ICD-10-CM | POA: Diagnosis not present

## 2023-10-24 DIAGNOSIS — Z8719 Personal history of other diseases of the digestive system: Secondary | ICD-10-CM | POA: Diagnosis not present

## 2023-10-24 DIAGNOSIS — I493 Ventricular premature depolarization: Secondary | ICD-10-CM | POA: Diagnosis not present

## 2023-10-24 DIAGNOSIS — I6523 Occlusion and stenosis of bilateral carotid arteries: Secondary | ICD-10-CM

## 2023-10-24 DIAGNOSIS — Z79899 Other long term (current) drug therapy: Secondary | ICD-10-CM | POA: Diagnosis not present

## 2023-10-24 DIAGNOSIS — N183 Chronic kidney disease, stage 3 unspecified: Secondary | ICD-10-CM

## 2023-10-24 DIAGNOSIS — E785 Hyperlipidemia, unspecified: Secondary | ICD-10-CM | POA: Diagnosis not present

## 2023-10-24 DIAGNOSIS — I10 Essential (primary) hypertension: Secondary | ICD-10-CM

## 2023-10-24 DIAGNOSIS — Z8673 Personal history of transient ischemic attack (TIA), and cerebral infarction without residual deficits: Secondary | ICD-10-CM | POA: Diagnosis not present

## 2023-10-24 MED ORDER — FOLIVANE-PLUS PO CAPS: 1.0000 | ORAL_CAPSULE | Freq: Every day | ORAL | 3 refills | Status: DC

## 2023-10-24 NOTE — Patient Instructions (Addendum)
Medication Instructions:  Your Physician recommend you continue on your current medication as directed.    *If you need a refill on your cardiac medications before your next appointment, please call your pharmacy*  Lab Work: Your provider would like for you to have following labs drawn today CBC, BMT, Mag level, and Iron panel.   If you have labs (blood work) drawn today and your tests are completely normal, you will receive your results only by: MyChart Message (if you have MyChart) OR A paper copy in the mail If you have any lab test that is abnormal or we need to change your treatment, we will call you to review the results.  Follow-Up: At [facility], you and your health needs are our priority.  As part of our continuing mission to provide you with exceptional heart care, we have created designated Provider Care Teams.  These Care Teams include your primary Cardiologist (physician) and Advanced Practice Providers (APPs -  Physician Assistants and Nurse Practitioners) who all work together to provide you with the care you need, when you need it.  We recommend signing up for the patient portal called "MyChart".  Sign up information is provided on this After Visit Summary.  MyChart is used to connect with patients for Virtual Visits (Telemedicine).  Patients are able to view lab/test results, encounter notes, upcoming appointments, etc.  Non-urgent messages can be sent to your provider as well.   To learn more about what you can do with MyChart, go to [email or web address].    Your next appointment:   1 month(s)  Provider:   You may see Lorine Bears, MD or one of the following Advanced Practice Providers on your designated Care Team:   Eula Listen, [place]

## 2023-10-25 LAB — CBC
Hematocrit: 32.1 % — ABNORMAL LOW (ref 34.0–46.6)
Hemoglobin: 10.8 g/dL — ABNORMAL LOW (ref 11.1–15.9)
MCH: 30.3 pg (ref 26.6–33.0)
MCHC: 33.6 g/dL (ref 31.5–35.7)
MCV: 90 fL (ref 79–97)
Platelets: 349 10*3/uL (ref 150–450)
RBC: 3.56 x10E6/uL — ABNORMAL LOW (ref 3.77–5.28)
RDW: 12.7 % (ref 11.7–15.4)
WBC: 9 10*3/uL (ref 3.4–10.8)

## 2023-10-25 LAB — BASIC METABOLIC PANEL
BUN/Creatinine Ratio: 14 (ref 12–28)
BUN: 18 mg/dL (ref 8–27)
CO2: 24 mmol/L (ref 20–29)
Calcium: 9.5 mg/dL (ref 8.7–10.3)
Chloride: 99 mmol/L (ref 96–106)
Creatinine, Ser: 1.28 mg/dL — ABNORMAL HIGH (ref 0.57–1.00)
Glucose: 116 mg/dL — ABNORMAL HIGH (ref 70–99)
Potassium: 4.1 mmol/L (ref 3.5–5.2)
Sodium: 137 mmol/L (ref 134–144)
eGFR: 41 mL/min/{1.73_m2} — ABNORMAL LOW (ref >59)

## 2023-10-25 LAB — IRON,TIBC AND FERRITIN PANEL
Ferritin: 78 ng/mL (ref 15–150)
Iron Saturation: 22 % (ref 15–55)
Iron: 74 ug/dL (ref 27–139)
Total Iron Binding Capacity: 341 ug/dL (ref 250–450)
UIBC: 267 ug/dL (ref 118–369)

## 2023-10-25 LAB — MAGNESIUM: Magnesium: 1.7 mg/dL (ref 1.6–2.3)

## 2023-11-06 ENCOUNTER — Other Ambulatory Visit: Payer: Self-pay | Admitting: *Deleted

## 2023-11-06 DIAGNOSIS — R0609 Other forms of dyspnea: Secondary | ICD-10-CM

## 2023-11-19 NOTE — Progress Notes (Signed)
Cardiology Office Note    Date:  11/20/2023   ID:  Andrea Hart, DOB [birth year], MRN [id number]  PCP:  Erasmo Downer, MD  Cardiologist:  Lorine Bears, MD  Electrophysiologist:  None   Chief Complaint: Follow-up  History of Present Illness:   Andrea Hart is a 87 y.o. female with history of CAD status post stenting in 2013 to the RCA status post PCI/DES to the RCA overlapping the previously placed stent on 05/24/2022, CKD stage III, prior CVA x2, HTN, HLD, carotid artery disease status post left TCAR in 04/2023, GI bleeding in 02/2021, PVCs, hypothyroidism, and asthma who presents for follow-up of CAD.   She underwent prior coronary stenting in [place], [place] in 2013.  In 2017, she suffered a stroke.  ILR did not show any evidence of A-fib, and she has been maintained on aspirin and clopidogrel since.  In 01/2016, she complained of presyncope and underwent event monitoring.  This showed frequent PVCs with a 10% burden.  She reported a long history of PVCs and has been managed with beta-blocker therapy.  She has history of difficult to control hypertension with renal artery ultrasound in 07/2020 showing no evidence of RAS.  Carotid artery ultrasound in 11/2020 showed moderate, nonobstructive bilateral left greater than right ICA disease.  She was admitted in 02/2021 with rectal bleeding with GI feeling the presentation was most consistent with an acute diverticular bleed.  Aspirin and clopidogrel were held and she underwent outpatient colonoscopy in 04/2021 which demonstrated nonbleeding internal hemorrhoids, moderate diverticulosis without evidence of diverticular bleeding.  She was subsequently resumed on aspirin and clopidogrel.  She was seen in the office in 02/2022 and was without symptoms of angina or decompensation.  She remained active with multiple social events.  Carotid artery ultrasound from 02/2022 demonstrated 1 to 39% RICA stenosis and 60 to 79% LICA stenosis (previously 50 to 69%).   She was seen on 05/16/2022 noting a 9 to 90-month history of exertional dyspnea that had become more pronounced.  Symptoms felt similar, though not as severe, to her prior angina leading up to PCI in 2013.  She was without frank chest pain.  Given concerning symptoms, she underwent LHC on 05/24/2022 which showed moderately to severely calcified coronary arteries with significant one-vessel CAD.  There was 95% stenosis in the proximal/mid RCA just before the previously placed stent.  LVEDP was upper limit of normal.  She underwent successful PCI/DES to the RCA overlapping with the previously placed stent.  Post-cath labs notable for a hemoglobin of 9.4.  Carvedilol was decreased secondary to bradycardia.     She was seen in the office on 05/30/2022 and noted improvement in her dyspnea following PCI to the RCA.  She was frustrated as she continued to be fatigued, though this is subsequently improved with cardiac rehab.  Bradycardia improved following the tapering of carvedilol.  Echo on 06/22/2022 demonstrated an EF of 60 to 65%, no regional wall motion abnormalities, moderate LVH, grade 1 diastolic dysfunction, normal RV systolic function and ventricular cavity size, mildly dilated left atrium, mild mitral regurgitation, aortic valve sclerosis without evidence of stenosis, and an estimated right atrial pressure of 3 mmHg.     She was subsequently rechallenged on carvedilol due to elevated BPs, though again reported intolerance leading to its discontinuation.     Carotid artery ultrasound from 04/10/2023 showed showed 80 to 99% left ICA stenosis with 1 to 39% right ICA stenosis.  This was a progression of the  left ICA stenosis that was previously noted to be 60 to 79% in 02/2022.  Subsequent CTA of the head/neck on 04/12/2023 demonstrated 75% stenosis of the proximal left ICA just beyond the bifurcation as well as atherosclerotic calcification at the right carotid bifurcation and internal carotid arteries without  significant stenosis.  She was evaluated by vascular surgery and underwent left TCAR on 04/18/2023 for asymptomatic critical ICA stenosis.  Follow-up carotid artery ultrasound on 05/28/2023 showed 1 to 39% right ICA stenosis with patent left ICA stenosis without obvious evidence of stenosis.   She was seen in the office on 06/22/2023 noting an increase in exertional fatigue and dyspnea that became more noticeable following TCAR.  She was without frank chest pain.  Lexiscan MPI on 06/28/2023 showed no significant ischemia with an EF of 70%.  CT attenuation corrected images showed three-vessel coronary artery calcification and aortic atherosclerosis.  Overall, this was a low risk scan.  Echo on 07/25/2023 showed an EF of 60 to 65%, no regional wall motion abnormalities, mild LVH, grade 1 diastolic dysfunction, normal RV systolic function and ventricular cavity size, moderately dilated left atrium, mild mitral regurgitation with moderate mitral annular calcification, aortic valve sclerosis without evidence of stenosis, and an estimated right atrial pressure of 3 mmHg.   She was seen in the office on 08/03/2023 and continued to feel about the same as she had over the prior several visits, though a little improved off statin therapy.  She continued to note exertional shortness of breath and fatigue without frank angina.  No evidence of volume overload.  She was subsequently evaluated by pulmonology with PFTs showing normal spirometry without evidence of obstruction, restriction and with normal lung volumes.  DLCO was mildly reduced and felt to possibly be related to her underlying anemia.  Double contrast esophagogram did not show evidence of hiatal hernia.  CPX was declined.  No further pulmonology workup was indicated.   She was seen in the office on 10/03/2023 and continued to feel the same as she had over prior several visits, albeit with some improved fatigue following adjustment of levothyroxine.  She did continue  to note exertional dyspnea, without frank chest pain.  In this setting, she underwent R/LHC on 10/15/2023 showed patent coronary arteries with diffuse LAD and RCA calcification, mild nonobstructive plaquing in the LAD and LCx, and patent stents in the RCA with only mild in-stent restenosis.  Essentially normal RHC with preserved cardiac output.  The patient was well compensated from a cardiac perspective with no evidence of high-grade obstructive CAD.  She was most recently seen in follow-up of cardiac cath in 10/2023 and continued to feel the same as she had at her prior visits with ongoing fatigue and shortness of breath without frank chest pain.  Given that she noted significant improvement in symptoms previously while in cardiac rehab, we attempted to reenroll her in cardiac/pulmonary rehab, they were unsuccessful with recommendation for her to join a program such as Geophysicist/field seismologist.  She comes in, given family today and notes a significant improvement in her functional status, fatigue, and dyspnea that occurred a couple of days after her last visit.  She has been very active in this setting.  No frank chest pain, dizziness, presyncope, or syncope.  No lower extremity swelling or progressive orthopnea.  No early satiety.  She did take a sleep aid advertised by Michael Litter 11/18/2023 and has had some abdominal cramping since then.  She is a little fatigued today after spending her  morning helping out with bingo.  No falls or symptoms concerning for bleeding.  She is very pleased with her improvement in functional status and dyspnea.   Labs independently reviewed: 10/2023 - magnesium 1.7, Hgb 10.8, PLT 349, BUN 18, serum creatinine 1.28, potassium 4.1 09/2023 - albumin 4.2, AST/ALT normal, TSH 6.290 08/2023 - Free T4 normal 04/2023 - TC 160, TG 116, HDL 54, LDL 39  Past Medical History:  Diagnosis Date   Abdominal hernia    Arthritis    Asthma    Carotid arterial disease (HCC)    a. 11/2020  Carotid U/S: Less than 50% RICA, 50-69% LICA.   CKD (chronic kidney disease), stage III (HCC)    Coronary artery disease    a. 2013 s/p PCI and stent placement in [facility] done for stable angina.   GI bleeding 02/02/2021   History of blood transfusion    History of hiatal hernia    had  surgery   Hyperlipidemia    Hypertension    Hypothyroidism    Pneumonia    years ago   PVC's (premature ventricular contractions)    a. 01/2020 Zio: RSR, 70, frequent PVCs with 10.5% burden.   Stroke [facility])    a. 2017 - ILR did not show afib.    Past Surgical History:  Procedure Laterality Date   ABDOMINAL HYSTERECTOMY     APPENDECTOMY     CATARACT EXTRACTION Bilateral    CHOLECYSTECTOMY     COLONOSCOPY WITH PROPOFOL N/A 04/06/2021   Procedure: COLONOSCOPY WITH PROPOFOL;  Surgeon: Toledo, Boykin Nearing, MD;  Location: [facility] ENDOSCOPY;  Service: Gastroenterology;  Laterality: N/A;   CORONARY ANGIOPLASTY  2013   1xStent [facility] [place].   CORONARY STENT INTERVENTION N/A 05/24/2022   Procedure: CORONARY STENT INTERVENTION;  Surgeon: Iran Ouch, MD;  Location: [facility] INVASIVE CV LAB;  Service: Cardiovascular;  Laterality: N/A;   EYE SURGERY     HAND SURGERY Right    LEFT HEART CATH AND CORONARY ANGIOGRAPHY Left 05/24/2022   Procedure: LEFT HEART CATH AND CORONARY ANGIOGRAPHY;  Surgeon: Iran Ouch, MD;  Location: [facility] INVASIVE CV LAB;  Service: Cardiovascular;  Laterality: Left;   LOOP RECORDER IMPLANT     NISSEN FUNDOPLICATION  2016   REPLACEMENT TOTAL KNEE Bilateral 2017   RIGHT HEART CATH AND CORONARY ANGIOGRAPHY N/A 10/15/2023   Procedure: RIGHT HEART CATH AND CORONARY ANGIOGRAPHY;  Surgeon: Tonny Bollman, MD;  Location: [facility] INVASIVE CV LAB;  Service: Cardiovascular;  Laterality: N/A;   TOTAL HIP ARTHROPLASTY Right    TOTAL VAGINAL HYSTERECTOMY     TRANSCAROTID ARTERY REVASCULARIZATION  Left 04/18/2023   Procedure: LEFT Transcarotid Artery Revascularization;  Surgeon:  Nada Libman, MD;  Location: [facility] OR;  Service: Vascular;  Laterality: Left;   ULTRASOUND GUIDANCE FOR VASCULAR ACCESS  04/18/2023   Procedure: ULTRASOUND GUIDANCE FOR VASCULAR ACCESS;  Surgeon: Nada Libman, MD;  Location: [facility] OR;  Service: Vascular;;    Current Medications: Current Meds  Medication Sig   acetaminophen (TYLENOL) 500 MG tablet Take 1,000 mg by mouth at bedtime.   albuterol (VENTOLIN HFA) 108 (90 Base) MCG/ACT inhaler Inhale 2 puffs into the lungs every 4 (four) hours as needed for wheezing or shortness of breath.   amLODipine (NORVASC) 10 MG tablet Take 1 tablet (10 mg total) by mouth daily.   aspirin EC 81 MG tablet Take 81 mg by mouth every evening.   budesonide-formoterol (SYMBICORT) 160-4.5 MCG/ACT inhaler Inhale 2 puffs into the lungs  2 (two) times daily.   cetirizine (ZYRTEC) 10 MG tablet Take 1 tablet (10 mg total) by mouth daily. (Patient taking differently: Take 10 mg by mouth at bedtime.)   Cholecalciferol (VITAMIN D3) 125 MCG (5000 UT) TABS Take by mouth.   clobetasol (TEMOVATE) 0.05 % external solution Apply 1 Application topically in the morning and at bedtime.   clopidogrel (PLAVIX) 75 MG tablet Take 1 tablet (75 mg total) by mouth daily.   FeFum-FePoly-FA-B Cmp-C-Biot (FOLIVANE-PLUS) CAPS Take 1 capsule by mouth daily.   ketoconazole (NIZORAL) 2 % shampoo Apply 1 Application topically 2 (two) times a week.   levothyroxine (SYNTHROID) 88 MCG tablet Take 1 tablet (88 mcg total) by mouth daily.   methocarbamol (ROBAXIN) 500 MG tablet Take 1 tablet (500 mg total) by mouth at bedtime as needed for muscle spasms.   montelukast (SINGULAIR) 10 MG tablet Take 1 tablet (10 mg total) by mouth at bedtime.   omeprazole (PRILOSEC) 40 MG capsule Take 1 capsule (40 mg total) by mouth 2 (two) times daily.   rosuvastatin (CRESTOR) 20 MG tablet Take 1 tablet (20 mg total) by mouth daily.   sodium bicarbonate 650 MG tablet Take 1,300 mg by mouth 2 (two) times daily.    triamcinolone cream (KENALOG) 0.1 % Apply 1 Application topically 2 (two) times daily as needed (psoriasis (elbows)).   TURMERIC PO Take 1 capsule by mouth daily with lunch. Qunol Joint Comfort With Turmeric Capsules   valsartan (DIOVAN) 320 MG tablet TAKE 1 TABLET BY MOUTH EVERY DAY   zinc sulfate 220 (50 Zn) MG capsule Take 220 mg by mouth daily with lunch.    Allergies:   Patient has no known allergies.   Social History   Socioeconomic History   Marital status: Widowed    Spouse name: Not on file   Number of children: 2   Years of education: Not on file   Highest education level: Some college, no degree  Occupational History    Comment: retired Photographer  Tobacco Use   Smoking status: Never   Smokeless tobacco: Never  Vaping Use   Vaping status: Never Used  Substance and Sexual Activity   Alcohol use: Not Currently    Comment: Occasional   Drug use: Never   Sexual activity: Never    Birth control/protection: Surgical  Other Topics Concern   Not on file  Social History Narrative   Moved here in 2019, had been living in [place].    Born here, raised daughters here.    Lives with daughter now, [place].   Lost one daughter in car wreck.    Social Drivers of Corporate investment banker Strain: Low Risk  (09/07/2023)   Overall Financial Resource Strain (CARDIA)    Difficulty of Paying Living Expenses: Not very hard  Food Insecurity: Food Insecurity Present (09/07/2023)   Hunger Vital Sign    Worried About Running Out of Food in the Last Year: Sometimes true    Ran Out of Food in the Last Year: Never true  Transportation Needs: No Transportation Needs (09/07/2023)   PRAPARE - Administrator, Civil Service (Medical): No    Lack of Transportation (Non-Medical): No  Physical Activity: Insufficiently Active (09/07/2023)   Exercise Vital Sign    Days of Exercise per Week: 3 days    Minutes of Exercise per Session: 20 min  Stress: No Stress Concern Present  (09/07/2023)   [employer] of Occupational Health - Occupational Stress Questionnaire  Feeling of Stress : Only a little  Social Connections: Moderately Integrated (09/07/2023)   Social Connection and Isolation Panel [NHANES]    Frequency of Communication with Friends and Family: More than three times a week    Frequency of Social Gatherings with Friends and Family: More than three times a week    Attends Religious Services: More than 4 times per year    Active Member of [employer] or Organizations: Yes    Attends Banker Meetings: More than 4 times per year    Marital Status: Widowed     Family History:  The patient's family history includes Heart Problems in her brother, father, and mother; Heart attack in her brother and mother. There is no history of Colon cancer or Breast cancer.  ROS:   12-point review of systems is negative unless otherwise noted in the HPI.   EKGs/Labs/Other Studies Reviewed:    Studies reviewed were summarized above. The additional studies were reviewed today:  R/LHC 10/15/2023: 1.  Patent coronary arteries with diffuse LAD and RCA calcification, mild nonobstructive plaquing in the LAD and left circumflex, and patent stents in the RCA with only mild in-stent restenosis 2.  Essentially normal right heart catheterization with mean RA pressure 6 mmHg, PA pressure 33 over 15 mmHg, wedge pressure of 10 mmHg, and preserved cardiac output of 4.7 L/min.   Recommendations: Patient appears to be well compensated from a cardiac perspective.  She has no high-grade obstructive CAD.  Continue medical therapy. __________   2D echo 07/25/2023: 1. Left ventricular ejection fraction, by estimation, is 60 to 65%. The  left ventricle has normal function. The left ventricle has no regional  wall motion abnormalities. There is mild left ventricular hypertrophy.  Left ventricular diastolic parameters  are consistent with Grade I diastolic dysfunction (impaired  relaxation).  The average left ventricular global longitudinal strain is -15.6 %.   2. Right ventricular systolic function is normal. The right ventricular  size is normal. Tricuspid regurgitation signal is inadequate for assessing  PA pressure.   3. Left atrial size was moderately dilated.   4. The mitral valve is normal in structure. Mild mitral valve  regurgitation. No evidence of mitral stenosis. Moderate mitral annular  calcification.   5. The aortic valve is normal in structure. There is mild calcification  of the aortic valve. Aortic valve regurgitation is not visualized. Aortic  valve sclerosis/calcification is present, without any evidence of aortic  stenosis.   6. The inferior vena cava is normal in size with greater than 50%  respiratory variability, suggesting right atrial pressure of 3 mmHg.  __________   Eugenie Birks MPI 06/28/2023: Pharmacological myocardial perfusion imaging study with no significant  ischemia Normal wall motion, EF estimated at 70% No EKG changes concerning for ischemia at peak stress or in recovery. Left bundle branch block noted during infusion that by report returned to narrow complex QRS in recovery CT attenuation correction images with three-vessel coronary calcification, moderate aortic atherosclerosis Low risk scan __________   Carotid artery ultrasound 05/28/2023: Summary:  Right Carotid: Velocities in the right ICA are consistent with a 1-39% stenosis.   Left Carotid: The ECA appears >50% stenosed. Patent stent without obvious evidence of stenosis.   Vertebrals: Bilateral vertebral arteries demonstrate antegrade flow.  __________   Carotid artery ultrasound 04/10/2023: Summary:  Right Carotid: Velocities in the right ICA are consistent with a 1-39% stenosis.   Left Carotid: Velocities in the left ICA are consistent with a 80-99% stenosis. The  ECA appears >50% stenosed.  __________   2D echo 06/22/2022: 1. Left ventricular ejection fraction,  by estimation, is 60 to 65%. The  left ventricle has normal function. The left ventricle has no regional  wall motion abnormalities. There is moderate left ventricular hypertrophy.  Left ventricular diastolic  parameters are consistent with Grade I diastolic dysfunction (impaired  relaxation).   2. Right ventricular systolic function is normal. The right ventricular  size is normal.   3. Left atrial size was mildly dilated.   4. The mitral valve is normal in structure. Mild mitral valve  regurgitation. No evidence of mitral stenosis.   5. The aortic valve is normal in structure. Aortic valve regurgitation is  not visualized. Aortic valve sclerosis/calcification is present, without  any evidence of aortic stenosis.   6. The inferior vena cava is normal in size with greater than 50%  respiratory variability, suggesting right atrial pressure of 3 mmHg. __________   LHC 05/24/2022:   Mid RCA lesion is 10% stenosed.   Prox RCA to Mid RCA lesion is 95% stenosed.   Prox LAD to Mid LAD lesion is 20% stenosed.   A drug-eluting stent was successfully placed using a STENT ONYX FRONTIER 3.0X18.   Post intervention, there is a 0% residual stenosis.   1.  Moderately to severely calcified coronary arteries with significant one-vessel coronary artery disease.   There is 95% stenosis in the proximal/mid right coronary artery just before the previously placed stent.   2.  Left ventricular angiography was not performed due to chronic kidney disease.  LVEDP was at the upper limit of normal. 3.  Successful angioplasty and drug-eluting stent placement to the right coronary artery overlapping with the previously placed stent. 4.  Difficult catheterization via the right radial artery due to significant tortuosity of the innominate and right subclavian arteries.  Consider alternative route in the future.   Recommendations: Dual antiplatelet therapy for at least 6 months. Aggressive treatment of risk  factors. __________   Carotid artery ultrasound 02/23/2022: Summary:  Right Carotid: Velocities in the right ICA are consistent with a 1-39%  stenosis. Non-hemodynamically significant plaque <50% noted in the  CCA. The ECA appears <50% stenosed.   Left Carotid: Velocities in the left ICA are consistent with a 60-79%  stenosis. Non-hemodynamically significant plaque <50% noted in the  CCA. The ECA appears >50% stenosed.      The bilateral lobes of the thyroid had a globuated  appearance   Suggest follow up study in 12 months.  __________   Renal artery ultrasound 07/16/2020: Summary:  Largest Aortic Diameter: 2.0 cm     Renal:     Right: Normal size right kidney. Normal right Resisitive Index.         Normal cortical thickness of right kidney. No evidence of         right renal artery stenosis. RRV flow present.  Left:  LRV flow present. No evidence of left renal artery stenosis.         Normal size of left kidney. Normal left Resistive Index.         Normal cortical thickness of the left kidney. __________   Outpatient cardiac monitoring 01/2020: Normal sinus rhythm with an average heart rate of 70 bpm. 1 short run of SVT lasted 4 beats. Frequent PVCs with a burden of 10.5%.  Ventricular bigeminy and trigeminy were present.   EKG:  EKG is ordered today.  The EKG ordered today demonstrates NSR, 70 bpm,  LBBB  Recent Labs: 09/10/2023: ALT 20; TSH 6.290 10/24/2023: BUN 18; Creatinine, Ser 1.28; Hemoglobin 10.8; Magnesium 1.7; Platelets 349; Potassium 4.1; Sodium 137  Recent Lipid Panel    Component Value Date/Time   CHOL 116 04/19/2023 0437   CHOL 160 07/17/2022 0903   TRIG 116 04/19/2023 0437   HDL 54 04/19/2023 0437   HDL 69 07/17/2022 0903   CHOLHDL 2.1 04/19/2023 0437   VLDL 23 04/19/2023 0437   LDLCALC 39 04/19/2023 0437   LDLCALC 70 07/17/2022 0903    PHYSICAL EXAM:    VS:  BP (!) 160/81 (BP Location: Left Arm, Patient Position: Sitting, Cuff Size: Normal)    Pulse 70   Ht 5' (1.524 m)   Wt 141 lb 3.2 oz (64 kg)   SpO2 97%   BMI 27.58 kg/m   BMI: Body mass index is 27.58 kg/m.  Physical Exam Vitals reviewed.  Constitutional:      Appearance: She is well-developed.  HENT:     Head: Normocephalic and atraumatic.  Eyes:     General:        Right eye: No discharge.        Left eye: No discharge.  Cardiovascular:     Rate and Rhythm: Normal rate and regular rhythm.     Heart sounds: Normal heart sounds, S1 normal and S2 normal. Heart sounds not distant. No midsystolic click and no opening snap. No murmur heard.    No friction rub.  Pulmonary:     Effort: Pulmonary effort is normal. No respiratory distress.     Breath sounds: Normal breath sounds. No decreased breath sounds, wheezing, rhonchi or rales.  Chest:     Chest wall: No tenderness.  Abdominal:     General: There is no distension.  Musculoskeletal:     Cervical back: Normal range of motion.     Right lower leg: No edema.     Left lower leg: No edema.  Skin:    General: Skin is warm and dry.     Nails: There is no clubbing.  Neurological:     Mental Status: She is alert and oriented to person, place, and time.  Psychiatric:        Speech: Speech normal.        Behavior: Behavior normal.        Thought Content: Thought content normal.        Judgment: Judgment normal.     Wt Readings from Last 3 Encounters:  11/20/23 141 lb 3.2 oz (64 kg)  10/24/23 143 lb 12.8 oz (65.2 kg)  10/15/23 140 lb (63.5 kg)     ASSESSMENT & PLAN:   CAD involving native coronary arteries with stable exertional dyspnea: She notes a significant improvement in exertional fatigue and dyspnea a couple of days after her last visit that continues to persist.  It is unclear what led to this significant improvement in symptoms, and has no intervention or change in pharmacotherapy occurred to coincide with improvement.  Continue aggressive risk factor modification and secondary prevention including  aspirin, clopidogrel, amlodipine, and rosuvastatin.  No indication for further ischemic testing at this time.  HTN: Blood pressure is mildly elevated in the office today, though typically well-controlled at home.  Remains on amlodipine 10 mg, valsartan 320 mg, and spironolactone 25 mg.  Recent labs stable.  HLD: LDL 39 in 04/2023 with normal AST/ALT in 09/2023.  Remains on rosuvastatin 20 mg.  PVCs: Asymptomatic.  No longer on beta-blocker with noted  fatigue and prior bradycardia.  Intermittent LBBB: Asymptomatic.  She was noted to have a left bundle branch block on EKG following administration of regadenoson during Lexiscan MPI this past summer.  She is again noted to have a left bundle branch block on EKG today.  No symptoms of dizziness, presyncope, or syncope.  Recent LHC on 10/15/2023 showed patent RCA stent with otherwise nonobstructive disease.  Obtain echo to evaluate for any new structural abnormalities.  Carotid artery stenosis: Status post left TCAR in 04/2023 with ultrasound in 05/2023 showing patent stent with no obvious evidence of stenosis.  Remains on aspirin, clopidogrel, and rosuvastatin as outlined above.  Followed by vascular surgery.   History of CVA: No new deficits.  Remains on aspirin, clopidogrel, and rosuvastatin.  History of GI bleed/iron deficiency anemia: Recent CBC showed stable hemoglobin.  No symptoms concerning for bleeding.  CKD stage III: Stable on check last month.    Disposition: F/u with Dr. Kirke Corin or an APP in 3 months.   Medication Adjustments/Labs and Tests Ordered: Current medicines are reviewed at length with the patient today.  Concerns regarding medicines are outlined above. Medication changes, Labs and Tests ordered today are summarized above and listed in the Patient Instructions accessible in Encounters.   Signed, Eula Listen, PA-C 11/20/2023 4:30 PM     [facility] [place] [address] [place], [place]  [address] [phone]

## 2023-11-20 ENCOUNTER — Ambulatory Visit: Payer: Medicare Other | Attending: Physician Assistant | Admitting: Physician Assistant

## 2023-11-20 ENCOUNTER — Encounter: Payer: Self-pay | Admitting: Physician Assistant

## 2023-11-20 VITALS — BP 160/81 | HR 70 | Ht 60.0 in | Wt 141.2 lb

## 2023-11-20 DIAGNOSIS — Z8719 Personal history of other diseases of the digestive system: Secondary | ICD-10-CM | POA: Diagnosis not present

## 2023-11-20 DIAGNOSIS — Z8673 Personal history of transient ischemic attack (TIA), and cerebral infarction without residual deficits: Secondary | ICD-10-CM | POA: Diagnosis not present

## 2023-11-20 DIAGNOSIS — I493 Ventricular premature depolarization: Secondary | ICD-10-CM | POA: Diagnosis not present

## 2023-11-20 DIAGNOSIS — I6523 Occlusion and stenosis of bilateral carotid arteries: Secondary | ICD-10-CM | POA: Diagnosis not present

## 2023-11-20 DIAGNOSIS — I447 Left bundle-branch block, unspecified: Secondary | ICD-10-CM | POA: Insufficient documentation

## 2023-11-20 DIAGNOSIS — E785 Hyperlipidemia, unspecified: Secondary | ICD-10-CM | POA: Diagnosis not present

## 2023-11-20 DIAGNOSIS — I25118 Atherosclerotic heart disease of native coronary artery with other forms of angina pectoris: Secondary | ICD-10-CM | POA: Diagnosis not present

## 2023-11-20 DIAGNOSIS — N183 Chronic kidney disease, stage 3 unspecified: Secondary | ICD-10-CM | POA: Insufficient documentation

## 2023-11-20 DIAGNOSIS — I10 Essential (primary) hypertension: Secondary | ICD-10-CM | POA: Insufficient documentation

## 2023-11-20 NOTE — Patient Instructions (Signed)
Medication Instructions:  Your Physician recommend you continue on your current medication as directed.    *If you need a refill on your cardiac medications before your next appointment, please call your pharmacy*   Lab Work: None ordered at this time    Testing/Procedures: Your physician has requested that you have an LIMITED echocardiogram. Echocardiography is a painless test that uses sound waves to create images of your heart. It provides your doctor with information about the size and shape of your heart and how well your heart's chambers and valves are working.   You may receive an ultrasound enhancing agent through an IV if needed to better visualize your heart during the echo. This procedure takes approximately one hour.  There are no restrictions for this procedure.  This will take place at [address] [facility] [address], [place] [address]  Please note: We ask at that you not bring children with you during ultrasound (echo/ vascular) testing. Due to room size and safety concerns, children are not allowed in the ultrasound rooms during exams. Our front office staff cannot provide observation of children in our lobby area while testing is being conducted. An adult accompanying a patient to their appointment will only be allowed in the ultrasound room at the discretion of the ultrasound technician under special circumstances. We apologize for any inconvenience.    Follow-Up: At [facility], you and your health needs are our priority.  As part of our continuing mission to provide you with exceptional heart care, we have created designated Provider Care Teams.  These Care Teams include your primary Cardiologist (physician) and Advanced Practice Providers (APPs -  Physician Assistants and Nurse Practitioners) who all work together to provide you with the care you need, when you need it.  We recommend signing up for the patient portal called "MyChart".  Sign up  information is provided on this After Visit Summary.  MyChart is used to connect with patients for Virtual Visits (Telemedicine).  Patients are able to view lab/test results, encounter notes, upcoming appointments, etc.  Non-urgent messages can be sent to your provider as well.   To learn more about what you can do with MyChart, go to [email or web address].    Your next appointment:   3 month(s)  Provider:   You may see Lorine Bears, MD or one of the following Advanced Practice Providers on your designated Care Team:   Eula Listen, [place]

## 2023-12-10 ENCOUNTER — Telehealth: Payer: Self-pay

## 2023-12-10 ENCOUNTER — Encounter: Payer: Self-pay | Admitting: Family Medicine

## 2023-12-10 DIAGNOSIS — E039 Hypothyroidism, unspecified: Secondary | ICD-10-CM

## 2023-12-10 DIAGNOSIS — R7303 Prediabetes: Secondary | ICD-10-CM

## 2023-12-10 NOTE — Telephone Encounter (Signed)
 Ok to order A1c and TSH - use diagnoses prediabetes and hypothyroidism from her problem list. Recommend that she get them at least 2 days before her appt with me. Thanks

## 2023-12-10 NOTE — Telephone Encounter (Signed)
 Copied from CRM [id number]. Topic: Appointment Scheduling - Scheduling Inquiry for Clinic >> Dec 10, 2023  2:09 PM Teressa P wrote: Reason for CRM: pt has an appt on 13th and she wants to know if she needs labs can she come in this week for them.  She wants to do it one morning this week.  Just the labs  CB@   [phone]

## 2023-12-11 NOTE — Telephone Encounter (Signed)
 Patient notified. She plans to come on Thursday, which should be enough time.

## 2023-12-11 NOTE — Addendum Note (Signed)
 Addended by: Shirley Muscat on: 12/11/2023 04:30 PM   Modules accepted: Orders

## 2023-12-13 ENCOUNTER — Other Ambulatory Visit: Payer: Self-pay

## 2023-12-13 ENCOUNTER — Ambulatory Visit: Payer: Medicare Other | Attending: Physician Assistant

## 2023-12-13 DIAGNOSIS — I447 Left bundle-branch block, unspecified: Secondary | ICD-10-CM | POA: Diagnosis not present

## 2023-12-13 LAB — HEMOGLOBIN A1C
Est. average glucose Bld gHb Est-mCnc: 126 mg/dL
Hgb A1c MFr Bld: 6 % — ABNORMAL HIGH (ref 4.8–5.6)

## 2023-12-13 LAB — TSH: TSH: 6.13 u[IU]/mL — ABNORMAL HIGH (ref 0.450–4.500)

## 2023-12-13 MED ORDER — LEVOTHYROXINE SODIUM 100 MCG PO TABS: 100.0000 ug | ORAL_TABLET | Freq: Every day | ORAL | 1 refills | Status: DC

## 2023-12-14 LAB — ECHOCARDIOGRAM LIMITED
AV Mean grad: 8 mm[Hg]
AV Peak grad: 14.6 mm[Hg]
Ao pk vel: 1.91 m/s
Area-P 1/2: 3.12 cm2

## 2023-12-17 ENCOUNTER — Ambulatory Visit (INDEPENDENT_AMBULATORY_CARE_PROVIDER_SITE_OTHER): Payer: Medicare Other | Admitting: Family Medicine

## 2023-12-17 ENCOUNTER — Encounter: Payer: Self-pay | Admitting: Family Medicine

## 2023-12-17 VITALS — BP 152/76 | HR 73 | Ht 60.0 in | Wt 137.8 lb

## 2023-12-17 DIAGNOSIS — I2510 Atherosclerotic heart disease of native coronary artery without angina pectoris: Secondary | ICD-10-CM

## 2023-12-17 DIAGNOSIS — K219 Gastro-esophageal reflux disease without esophagitis: Secondary | ICD-10-CM

## 2023-12-17 DIAGNOSIS — I10 Essential (primary) hypertension: Secondary | ICD-10-CM | POA: Diagnosis not present

## 2023-12-17 DIAGNOSIS — N1831 Chronic kidney disease, stage 3a: Secondary | ICD-10-CM

## 2023-12-17 DIAGNOSIS — J4520 Mild intermittent asthma, uncomplicated: Secondary | ICD-10-CM

## 2023-12-17 DIAGNOSIS — E782 Mixed hyperlipidemia: Secondary | ICD-10-CM

## 2023-12-17 DIAGNOSIS — G4700 Insomnia, unspecified: Secondary | ICD-10-CM | POA: Insufficient documentation

## 2023-12-17 DIAGNOSIS — E559 Vitamin D deficiency, unspecified: Secondary | ICD-10-CM

## 2023-12-17 DIAGNOSIS — E039 Hypothyroidism, unspecified: Secondary | ICD-10-CM

## 2023-12-17 DIAGNOSIS — N3281 Overactive bladder: Secondary | ICD-10-CM | POA: Insufficient documentation

## 2023-12-17 DIAGNOSIS — R7303 Prediabetes: Secondary | ICD-10-CM

## 2023-12-17 MED ORDER — MONTELUKAST SODIUM 10 MG PO TABS: 10.0000 mg | ORAL_TABLET | Freq: Every day | ORAL | 1 refills | Status: DC

## 2023-12-17 MED ORDER — OMEPRAZOLE 40 MG PO CPDR: 40.0000 mg | DELAYED_RELEASE_CAPSULE | Freq: Two times a day (BID) | ORAL | 1 refills | Status: DC

## 2023-12-17 MED ORDER — OXYBUTYNIN CHLORIDE ER 5 MG PO TB24: 5.0000 mg | ORAL_TABLET | Freq: Every day | ORAL | 5 refills | Status: DC

## 2023-12-17 MED ORDER — AMLODIPINE BESYLATE 10 MG PO TABS: 10.0000 mg | ORAL_TABLET | Freq: Every day | ORAL | 1 refills | Status: DC

## 2023-12-17 MED ORDER — VALSARTAN 320 MG PO TABS: 320.0000 mg | ORAL_TABLET | Freq: Every day | ORAL | 1 refills | Status: DC

## 2023-12-17 NOTE — Assessment & Plan Note (Signed)
 Blood pressure elevated at 150/xx mmHg, previously well-controlled at 142/xx mmHg. Current medications include valsartan  320 mg, spironolactone  25 mg, amlodipine  10 mg. Recent stress and activity levels may contribute to fluctuations. Oxybutynin  may lower blood pressure. - Monitor blood pressure at home regularly - Maintain current medication regimen - Follow up with nephrologist next week and cardiologist in March - Consider potential hypotension effect of oxybutynin 

## 2023-12-17 NOTE — Assessment & Plan Note (Signed)
 Current Synthroid dose is 88 mcg, recent labs indicated need for increase to 100 mcg. Has not yet started the increased dose. - Start Synthroid 100 mcg - Recheck thyroid levels in 2 months with provided lab slip

## 2023-12-17 NOTE — Progress Notes (Signed)
 Established patient visit   Patient: Andrea Hart   DOB: 01/15/1936   88 y.o. Female  MRN: [id number] Visit Date: 12/17/2023  Today's healthcare provider: Jon Eva, MD   Chief Complaint  Patient presents with   Medical Management of Chronic Issues    6 month follow-up   Hypertension   Hyperlipidemia   Hypothyroidism    Symptom: Cold Intolerance   Subjective    Hypertension  Hyperlipidemia   HPI     Medical Management of Chronic Issues    Additional comments: 6 month follow-up        Hypertension   Anxiety: Absent.  Blurred vision: Absent.  Chest pain: Absent.  Chest pressure/discomfort: Absent.  Dyspnea: Absent.  Headaches: Absent.  Lower extremity edema: Absent.  Orthopnea: Absent.  Palpitations: Absent.  Paroxysmal nocturnal dyspnea: Absent.  Syncope: Absent.        Hyperlipidemia   Chest pain: Absent.  Chest pressure/discomfort: Absent.  Dyspnea: Absent.  Focal weakness: Absent.  Lower extremity edema: Absent.  Numbness or tingling of extremity: Absent.  Orthopnea: Absent.  Palpitations: Absent.  Paroxysmal nocturnal dyspnea: Absent.  Speech difficulty: Absent.  Syncope: Absent.        Hypothyroidism    Additional comments: Symptom: Cold Intolerance      Last edited by Lilian Fitzpatrick, CMA on 12/17/2023 10:02 AM.       Discussed the use of AI scribe software for clinical note transcription with the patient, who gave verbal consent to proceed.  History of Present Illness   The patient, accompanied by her daughter, presents with a chief complaint of insomnia. She reports difficulty falling asleep and staying asleep, waking up every two hours to urinate. This has been a persistent issue, causing her significant distress and impacting her quality of life. She denies any pain or discomfort during urination.  In addition to her sleep issues, the patient also reports high blood pressure, which she monitors at home. She notes that her readings have  been consistently high, around 150, despite being on medication. She also mentions experiencing leg cramps, which she manages by consuming pickles.  The patient has a history of heart problems and is currently on multiple medications, including valsartan , spironolactone , Crestor , omeprazole , Singulair , Robaxin , Synthroid , Plavix , baby aspirin , and amlodipine . She reports feeling much better overall, with improved energy levels and less breathlessness.         Medications: Outpatient Medications Prior to Visit  Medication Sig Note   acetaminophen  (TYLENOL ) 500 MG tablet Take 1,000 mg by mouth at bedtime.    albuterol  (VENTOLIN  HFA) 108 (90 Base) MCG/ACT inhaler Inhale 2 puffs into the lungs every 4 (four) hours as needed for wheezing or shortness of breath.    aspirin  EC 81 MG tablet Take 81 mg by mouth every evening.    cetirizine  (ZYRTEC ) 10 MG tablet Take 1 tablet (10 mg total) by mouth daily. (Patient taking differently: Take 10 mg by mouth at bedtime.)    Cholecalciferol (VITAMIN D3) 125 MCG (5000 UT) TABS Take by mouth.    clobetasol (TEMOVATE) 0.05 % external solution Apply 1 Application topically in the morning and at bedtime.    clopidogrel  (PLAVIX ) 75 MG tablet Take 1 tablet (75 mg total) by mouth daily.    FeFum-FePoly-FA-B Cmp-C-Biot (FOLIVANE-PLUS) CAPS Take 1 capsule by mouth daily.    ketoconazole (NIZORAL) 2 % shampoo Apply 1 Application topically 2 (two) times a week.    methocarbamol  (ROBAXIN ) 500 MG tablet Take 1  tablet (500 mg total) by mouth at bedtime as needed for muscle spasms.    rosuvastatin  (CRESTOR ) 20 MG tablet Take 1 tablet (20 mg total) by mouth daily.    sodium bicarbonate 650 MG tablet Take 1,300 mg by mouth 2 (two) times daily.    triamcinolone  cream (KENALOG ) 0.1 % Apply 1 Application topically 2 (two) times daily as needed (psoriasis (elbows)).    TURMERIC PO Take 1 capsule by mouth daily with lunch. Qunol Joint Comfort With Turmeric Capsules    zinc  sulfate 220 (50 Zn) MG capsule Take 220 mg by mouth daily with lunch.    [DISCONTINUED] amLODipine  (NORVASC ) 10 MG tablet Take 1 tablet (10 mg total) by mouth daily.    [DISCONTINUED] montelukast  (SINGULAIR ) 10 MG tablet Take 1 tablet (10 mg total) by mouth at bedtime.    [DISCONTINUED] omeprazole  (PRILOSEC) 40 MG capsule Take 1 capsule (40 mg total) by mouth 2 (two) times daily.    [DISCONTINUED] valsartan  (DIOVAN ) 320 MG tablet TAKE 1 TABLET BY MOUTH EVERY DAY    budesonide -formoterol  (SYMBICORT ) 160-4.5 MCG/ACT inhaler Inhale 2 puffs into the lungs 2 (two) times daily. (Patient not taking: Reported on 12/17/2023) 12/17/2023: Would like to go back to advair   levothyroxine  (SYNTHROID ) 100 MCG tablet Take 1 tablet (100 mcg total) by mouth daily. (Patient not taking: Reported on 12/17/2023) 12/17/2023: Will pick up today. Still taking previous rx   spironolactone  (ALDACTONE ) 25 MG tablet Take 1 tablet (25 mg total) by mouth daily.    Facility-Administered Medications Prior to Visit  Medication Dose Route Frequency Provider   albuterol  (PROVENTIL ) (2.5 MG/3ML) 0.083% nebulizer solution 2.5 mg  2.5 mg Nebulization Once Dgayli, Khabib, MD    Review of Systems     Objective    BP (!) 152/76 (BP Location: Left Arm, Patient Position: Sitting, Cuff Size: Normal)   Pulse 73   Ht 5' (1.524 m)   Wt 137 lb 12.8 oz (62.5 kg)   SpO2 98%   BMI 26.91 kg/m    Physical Exam Vitals reviewed.  Constitutional:      General: She is not in acute distress.    Appearance: Normal appearance. She is well-developed. She is not diaphoretic.  HENT:     Head: Normocephalic and atraumatic.  Eyes:     General: No scleral icterus.    Conjunctiva/sclera: Conjunctivae normal.  Neck:     Thyroid : No thyromegaly.  Cardiovascular:     Rate and Rhythm: Normal rate and regular rhythm.     Heart sounds: Murmur heard.  Pulmonary:     Effort: Pulmonary effort is normal. No respiratory distress.     Breath sounds:  Normal breath sounds. No wheezing, rhonchi or rales.  Musculoskeletal:     Cervical back: Neck supple.     Right lower leg: No edema.     Left lower leg: No edema.  Lymphadenopathy:     Cervical: No cervical adenopathy.  Skin:    General: Skin is warm and dry.     Findings: No rash.  Neurological:     Mental Status: She is alert and oriented to person, place, and time. Mental status is at baseline.  Psychiatric:        Mood and Affect: Mood normal.        Behavior: Behavior normal.      No results found for any visits on 12/17/23.  Assessment & Plan     Problem List Items Addressed This Visit  Cardiovascular and Mediastinum   Essential hypertension - Primary   Blood pressure elevated at 150/xx mmHg, previously well-controlled at 142/xx mmHg. Current medications include valsartan  320 mg, spironolactone  25 mg, amlodipine  10 mg. Recent stress and activity levels may contribute to fluctuations. Oxybutynin  may lower blood pressure. - Monitor blood pressure at home regularly - Maintain current medication regimen - Follow up with nephrologist next week and cardiologist in March - Consider potential hypotension effect of oxybutynin       Relevant Medications   amLODipine  (NORVASC ) 10 MG tablet   valsartan  (DIOVAN ) 320 MG tablet   CAD (coronary artery disease)   F/b cardiology [place] [facility]      Relevant Medications   amLODipine  (NORVASC ) 10 MG tablet   valsartan  (DIOVAN ) 320 MG tablet     Respiratory   Mild intermittent asthma   Relevant Medications   montelukast  (SINGULAIR ) 10 MG tablet     Digestive   Gastroesophageal reflux disease without esophagitis   Relevant Medications   omeprazole  (PRILOSEC) 40 MG capsule     Endocrine   Hypothyroidism   Current Synthroid  dose is 88 mcg, recent labs indicated need for increase to 100 mcg. Has not yet started the increased dose. - Start Synthroid  100 mcg - Recheck thyroid  levels in 2 months with provided lab slip       Relevant Orders   TSH     Genitourinary   CKD (chronic kidney disease), stage III (HCC)   F/b Nephrology Chronic and stable Recheck metabolic panel Avoid nephrotoxic meds       OAB (overactive bladder)   Frequent nocturnal urination every 1-2 hours, contributing to insomnia. Oxybutynin  is a first-line treatment and may cause drowsiness, beneficial for sleep. Other medications available if oxybutynin  is ineffective. - Prescribe oxybutynin  at a lower dose - Monitor for side effects and efficacy over 2-4 weeks - Report any adverse effects or lack of improvement        Other   Hyperlipidemia   Relevant Medications   amLODipine  (NORVASC ) 10 MG tablet   valsartan  (DIOVAN ) 320 MG tablet   Vitamin D deficiency   Prediabetes   Insomnia   Chronic difficulty falling and staying asleep, exacerbated by frequent nocturnal urination. Potential contributing factors include lack of daytime activity and overactive mind. Oxybutynin  may aid sleep due to its drowsiness side effect. Advised caution to prevent falls when getting up at night. - Prescribe oxybutynin  at a lower dose - Advise caution when getting up at night - Encourage increased daytime activity, including joining a gym           General Health Maintenance Review of current medications and overall health status. Discussed managing leg cramps with dietary modifications and potential magnesium  supplementation (caused diarrhea in the past though). - Refill all current medications - Encourage regular physical activity - Advise on managing leg cramps with dietary modifications and potential magnesium  supplementation  Follow-up - Follow up in 6 months for routine check-up - Contact if experiencing side effects or lack of improvement with new medication - Recheck thyroid  levels in 2 months with lab slip provided.        Return in about 6 months (around 06/15/2024) for CPE.       Jon Eva, MD  [facility] [phone] (phone) [phone] (fax)  [facility]

## 2023-12-17 NOTE — Assessment & Plan Note (Signed)
 Frequent nocturnal urination every 1-2 hours, contributing to insomnia. Oxybutynin  is a first-line treatment and may cause drowsiness, beneficial for sleep. Other medications available if oxybutynin  is ineffective. - Prescribe oxybutynin  at a lower dose - Monitor for side effects and efficacy over 2-4 weeks - Report any adverse effects or lack of improvement

## 2023-12-17 NOTE — Assessment & Plan Note (Signed)
 F/b cardiology [place] [facility]

## 2023-12-17 NOTE — Assessment & Plan Note (Signed)
 Chronic difficulty falling and staying asleep, exacerbated by frequent nocturnal urination. Potential contributing factors include lack of daytime activity and overactive mind. Oxybutynin  may aid sleep due to its drowsiness side effect. Advised caution to prevent falls when getting up at night. - Prescribe oxybutynin  at a lower dose - Advise caution when getting up at night - Encourage increased daytime activity, including joining a gym

## 2023-12-17 NOTE — Assessment & Plan Note (Signed)
F/b Nephrology °Chronic and stable °Recheck metabolic panel °Avoid nephrotoxic meds  °

## 2023-12-18 ENCOUNTER — Other Ambulatory Visit: Payer: Self-pay

## 2023-12-18 DIAGNOSIS — J4520 Mild intermittent asthma, uncomplicated: Secondary | ICD-10-CM

## 2023-12-18 MED ORDER — FLUTICASONE-SALMETEROL 500-50 MCG/ACT IN AEPB: 1.0000 | INHALATION_SPRAY | Freq: Two times a day (BID) | RESPIRATORY_TRACT | 3 refills | Status: DC

## 2023-12-18 NOTE — Telephone Encounter (Signed)
 Ok to switch. Send old Rx for advair. D/c Symbicort.

## 2024-01-06 ENCOUNTER — Other Ambulatory Visit: Payer: Self-pay | Admitting: Physician Assistant

## 2024-01-06 DIAGNOSIS — I10 Essential (primary) hypertension: Secondary | ICD-10-CM

## 2024-01-06 DIAGNOSIS — E785 Hyperlipidemia, unspecified: Secondary | ICD-10-CM

## 2024-01-14 ENCOUNTER — Other Ambulatory Visit: Payer: Self-pay | Admitting: Surgery

## 2024-01-14 DIAGNOSIS — M7582 Other shoulder lesions, left shoulder: Secondary | ICD-10-CM

## 2024-01-14 DIAGNOSIS — M19012 Primary osteoarthritis, left shoulder: Secondary | ICD-10-CM

## 2024-01-23 ENCOUNTER — Ambulatory Visit: Payer: Medicare Other

## 2024-01-23 ENCOUNTER — Other Ambulatory Visit: Payer: Medicare Other

## 2024-01-30 ENCOUNTER — Ambulatory Visit
Admission: RE | Admit: 2024-01-30 | Discharge: 2024-01-30 | Disposition: A | Discharge status: Home / Self Care | Payer: Medicare Other | Source: Ambulatory Visit | Attending: Surgery | Admitting: Surgery

## 2024-01-30 DIAGNOSIS — M7582 Other shoulder lesions, left shoulder: Secondary | ICD-10-CM | POA: Diagnosis present

## 2024-01-30 DIAGNOSIS — M19012 Primary osteoarthritis, left shoulder: Secondary | ICD-10-CM | POA: Insufficient documentation

## 2024-02-15 ENCOUNTER — Other Ambulatory Visit: Payer: Self-pay | Admitting: Surgery

## 2024-02-17 NOTE — Progress Notes (Unsigned)
 Cardiology Office Note    Date:  02/19/2024   ID:  Andrea Hart, DOB 09-04-1936, MRN [id number]  PCP:  Erasmo Downer, MD  Cardiologist:  Lorine Bears, MD  Electrophysiologist:  None   Chief Complaint: Follow-up  History of Present Illness:   Andrea Hart is a 88 y.o. female with history of CAD status post stenting in 2013 to the RCA status post PCI/DES to the RCA overlapping the previously placed stent on 05/24/2022, CKD stage III, prior CVA x2, HTN, HLD, carotid artery disease status post left TCAR in 04/2023, GI bleeding in 02/2021, PVCs, hypothyroidism, and asthma who presents for follow-up of CAD.  She underwent prior coronary stenting in [place], [place] in 2013.  In 2017, she suffered a stroke.  ILR did not show any evidence of A-fib, and she has been maintained on aspirin and clopidogrel since.  In 01/2016, she complained of presyncope and underwent event monitoring.  This showed frequent PVCs with a 10% burden.  She reported a long history of PVCs and has been managed with beta-blocker therapy.  She has history of difficult to control hypertension with renal artery ultrasound in 07/2020 showing no evidence of RAS.  Carotid artery ultrasound in 11/2020 showed moderate, nonobstructive bilateral left greater than right ICA disease.  She was admitted in 02/2021 with rectal bleeding with GI feeling the presentation was most consistent with an acute diverticular bleed.  Aspirin and clopidogrel were held and she underwent outpatient colonoscopy in 04/2021 which demonstrated nonbleeding internal hemorrhoids, moderate diverticulosis without evidence of diverticular bleeding.  She was subsequently resumed on aspirin and clopidogrel.  She was seen in the office in 02/2022 and was without symptoms of angina or decompensation.  She remained active with multiple social events.  Carotid artery ultrasound from 02/2022 demonstrated 1 to 39% RICA stenosis and 60 to 79% LICA stenosis (previously 50 to 69%).   She was seen on 05/16/2022 noting a 9 to 55-month history of exertional dyspnea that had become more pronounced.  Symptoms felt similar, though not as severe, to her prior angina leading up to PCI in 2013.  She was without frank chest pain.  Given concerning symptoms, she underwent LHC on 05/24/2022 which showed moderately to severely calcified coronary arteries with significant one-vessel CAD.  There was 95% stenosis in the proximal/mid RCA just before the previously placed stent.  LVEDP was upper limit of normal.  She underwent successful PCI/DES to the RCA overlapping with the previously placed stent.  Post-cath labs notable for a hemoglobin of 9.4.  Carvedilol was decreased secondary to bradycardia.     She was seen in the office on 05/30/2022 and noted improvement in her dyspnea following PCI to the RCA.  She was frustrated as she continued to be fatigued, though this is subsequently improved with cardiac rehab.  Bradycardia improved following the tapering of carvedilol.  Echo on 06/22/2022 demonstrated an EF of 60 to 65%, no regional wall motion abnormalities, moderate LVH, grade 1 diastolic dysfunction, normal RV systolic function and ventricular cavity size, mildly dilated left atrium, mild mitral regurgitation, aortic valve sclerosis without evidence of stenosis, and an estimated right atrial pressure of 3 mmHg.     She was subsequently rechallenged on carvedilol due to elevated BPs, though again reported intolerance leading to its discontinuation.     Carotid artery ultrasound from 04/10/2023 showed showed 80 to 99% left ICA stenosis with 1 to 39% right ICA stenosis.  This was a progression of the left  ICA stenosis that was previously noted to be 60 to 79% in 02/2022.  Subsequent CTA of the head/neck on 04/12/2023 demonstrated 75% stenosis of the proximal left ICA just beyond the bifurcation as well as atherosclerotic calcification at the right carotid bifurcation and internal carotid arteries without  significant stenosis.  She was evaluated by vascular surgery and underwent left TCAR on 04/18/2023 for asymptomatic critical ICA stenosis.  Follow-up carotid artery ultrasound on 05/28/2023 showed 1 to 39% right ICA stenosis with patent left ICA stenosis without obvious evidence of stenosis.   She was seen in the office on 06/22/2023 noting an increase in exertional fatigue and dyspnea that became more noticeable following TCAR.  She was without frank chest pain.  Lexiscan MPI on 06/28/2023 showed no significant ischemia with an EF of 70%.  CT attenuation corrected images showed three-vessel coronary artery calcification and aortic atherosclerosis.  Overall, this was a low risk scan.  Echo on 07/25/2023 showed an EF of 60 to 65%, no regional wall motion abnormalities, mild LVH, grade 1 diastolic dysfunction, normal RV systolic function and ventricular cavity size, moderately dilated left atrium, mild mitral regurgitation with moderate mitral annular calcification, aortic valve sclerosis without evidence of stenosis, and an estimated right atrial pressure of 3 mmHg.   She was seen in the office on 08/03/2023 and continued to feel about the same as she had over the prior several visits, though a little improved off statin therapy.  She continued to note exertional shortness of breath and fatigue without frank angina.  No evidence of volume overload.  She was subsequently evaluated by pulmonology with PFTs showing normal spirometry without evidence of obstruction, restriction and with normal lung volumes.  DLCO was mildly reduced and felt to possibly be related to her underlying anemia.  Double contrast esophagogram did not show evidence of hiatal hernia.  CPX was declined.  No further pulmonology workup was indicated.   She was seen in the office on 10/03/2023 and continued to feel the same as she had over prior several visits, albeit with some improved fatigue following adjustment of levothyroxine.  She did continue  to note exertional dyspnea, without frank chest pain.  In this setting, she underwent R/LHC on 10/15/2023 showed patent coronary arteries with diffuse LAD and RCA calcification, mild nonobstructive plaquing in the LAD and LCx, and patent stents in the RCA with only mild in-stent restenosis.  Essentially normal RHC with preserved cardiac output.  The patient was well compensated from a cardiac perspective with no evidence of high-grade obstructive CAD.  She was seen in follow-up of cardiac cath in 10/2023 and continued to feel the same as she had at her prior visits with ongoing fatigue and shortness of breath without frank chest pain.  Given that she noted significant improvement in symptoms previously while in cardiac rehab, we attempted to reenroll her in cardiac/pulmonary rehab, they were unsuccessful with recommendation for her to join a program such as Geophysicist/field seismologist.  She was last seen in the office in 11/2023 noting a significant improvement in her functional status, fatigue, and dyspnea that occurred a couple days after her last visit.  She was very active in this setting.  In the setting of intermittent LBBB she underwent echo on 12/13/2023 that showed an EF of 55 to 60%, septal wall motion consistent with bundle branch block, grade 1 diastolic dysfunction, normal RV systolic function and ventricular cavity size, mild mitral regurgitation with moderate mitral annular calcification, aortic valve sclerosis without evidence of  stenosis, and an estimated right atrial pressure of 3 mmHg.  She comes in doing well from a cardiac perspective and is without symptoms of angina or cardiac decompensation.  She indicates her functional status is about the same as it was at her last visit, which is improved from prior.  No palpitations, dizziness, presyncope, or syncope.  No falls, hematochezia, or melena.  She does notice some lower extremity swelling that is progressive throughout the day if she sits for extended  time frames.  Swelling is improved in the morning or if she lays on the sofa.  Her weight is down 5 pounds today when compared to her last clinic visit in 11/2023.  Scheduled to undergo shoulder surgery next week.   [facility] Activity Status Index: Greater than 4 METs without cardiac limitation Revised Cardiac Risk Index: Class I risk with an estimated rate of 6% for adverse cardiac event in the periprocedural timeframe   Labs independently reviewed: 02/2024 - potassium 3.5, BUN 18, serum creatinine 1.1, albumin 3.8, AST/ALT normal, Hgb 12.1, PLT 247 12/2023 - magnesium 1.5, TSH 6.130, A1c 6.0 08/2023 - Free T4 normal  04/2023 - TC 160, TG 116, HDL 54, LDL 39   Past Medical History:  Diagnosis Date   Abdominal hernia    Arthritis    Asthma    Carotid arterial disease (HCC)    a. 11/2020 Carotid U/S: Less than 50% RICA, 50-69% LICA.   CKD (chronic kidney disease), stage III (HCC)    Coronary artery disease    a. 2013 s/p PCI and stent placement in [facility] done for stable angina.   GERD (gastroesophageal reflux disease)    GI bleeding 02/02/2021   History of blood transfusion    History of hiatal hernia    had  surgery   Hyperlipidemia    Hypertension    Hypothyroidism    Pneumonia    years ago   PVC's (premature ventricular contractions)    a. 01/2020 Zio: RSR, 70, frequent PVCs with 10.5% burden.   Stroke [facility])    a. 2017 - ILR did not show afib.    Past Surgical History:  Procedure Laterality Date   ABDOMINAL HYSTERECTOMY     APPENDECTOMY     CATARACT EXTRACTION Bilateral    CHOLECYSTECTOMY     COLONOSCOPY WITH PROPOFOL N/A 04/06/2021   Procedure: COLONOSCOPY WITH PROPOFOL;  Surgeon: Toledo, Boykin Nearing, MD;  Location: [facility] ENDOSCOPY;  Service: Gastroenterology;  Laterality: N/A;   CORONARY ANGIOPLASTY  2013   1xStent [facility] [place].   CORONARY STENT INTERVENTION N/A 05/24/2022   Procedure: CORONARY STENT INTERVENTION;  Surgeon: Iran Ouch, MD;   Location: [facility] INVASIVE CV LAB;  Service: Cardiovascular;  Laterality: N/A;   EYE SURGERY     HAND SURGERY Right    LEFT HEART CATH AND CORONARY ANGIOGRAPHY Left 05/24/2022   Procedure: LEFT HEART CATH AND CORONARY ANGIOGRAPHY;  Surgeon: Iran Ouch, MD;  Location: [facility] INVASIVE CV LAB;  Service: Cardiovascular;  Laterality: Left;   LOOP RECORDER IMPLANT     NISSEN FUNDOPLICATION  2016   REPLACEMENT TOTAL KNEE Bilateral 2017   RIGHT HEART CATH AND CORONARY ANGIOGRAPHY N/A 10/15/2023   Procedure: RIGHT HEART CATH AND CORONARY ANGIOGRAPHY;  Surgeon: Tonny Bollman, MD;  Location: [facility] INVASIVE CV LAB;  Service: Cardiovascular;  Laterality: N/A;   TOTAL HIP ARTHROPLASTY Right    TOTAL VAGINAL HYSTERECTOMY     TRANSCAROTID ARTERY REVASCULARIZATION  Left 04/18/2023   Procedure: LEFT Transcarotid  Artery Revascularization;  Surgeon: Nada Libman, MD;  Location: [facility] OR;  Service: Vascular;  Laterality: Left;   ULTRASOUND GUIDANCE FOR VASCULAR ACCESS  04/18/2023   Procedure: ULTRASOUND GUIDANCE FOR VASCULAR ACCESS;  Surgeon: Nada Libman, MD;  Location: [facility] OR;  Service: Vascular;;    Current Medications: Current Meds  Medication Sig   acetaminophen (TYLENOL) 500 MG tablet Take 1,000 mg by mouth at bedtime.   albuterol (VENTOLIN HFA) 108 (90 Base) MCG/ACT inhaler Inhale 2 puffs into the lungs every 4 (four) hours as needed for wheezing or shortness of breath.   amLODipine (NORVASC) 10 MG tablet Take 1 tablet (10 mg total) by mouth daily.   aspirin EC 81 MG tablet Take 81 mg by mouth every evening.   budesonide-formoterol (SYMBICORT) 160-4.5 MCG/ACT inhaler Inhale 2 puffs into the lungs 2 (two) times daily.   cetirizine (ZYRTEC) 10 MG tablet Take 1 tablet (10 mg total) by mouth daily. (Patient taking differently: Take 10 mg by mouth at bedtime.)   Cholecalciferol (VITAMIN D3) 125 MCG (5000 UT) TABS Take 1,000 Units by mouth daily.   clobetasol (TEMOVATE) 0.05 % external solution Apply 1  Application topically 2 (two) times daily.   clopidogrel (PLAVIX) 75 MG tablet TAKE 1 TABLET BY MOUTH EVERY DAY   FeFum-FePoly-FA-B Cmp-C-Biot (FOLIVANE-PLUS) CAPS Take 1 capsule by mouth daily.   ketoconazole (NIZORAL) 2 % shampoo Apply 1 Application topically every Saturday.   levothyroxine (SYNTHROID) 100 MCG tablet Take 1 tablet (100 mcg total) by mouth daily.   methocarbamol (ROBAXIN) 500 MG tablet Take 1 tablet (500 mg total) by mouth at bedtime as needed for muscle spasms.   montelukast (SINGULAIR) 10 MG tablet Take 1 tablet (10 mg total) by mouth at bedtime.   Multiple Vitamin (MULTIVITAMIN WITH MINERALS) TABS tablet Take 1 tablet by mouth daily.   omeprazole (PRILOSEC) 40 MG capsule Take 1 capsule (40 mg total) by mouth 2 (two) times daily.   oxybutynin (DITROPAN-XL) 5 MG 24 hr tablet Take 1 tablet (5 mg total) by mouth at bedtime.   rosuvastatin (CRESTOR) 20 MG tablet Take 1 tablet (20 mg total) by mouth daily.   sodium bicarbonate 650 MG tablet Take 1,300 mg by mouth 2 (two) times daily.   spironolactone (ALDACTONE) 25 MG tablet Take 1 tablet (25 mg total) by mouth daily.   triamcinolone cream (KENALOG) 0.1 % Apply 1 Application topically 2 (two) times daily as needed (psoriasis (elbows)).   TURMERIC PO Take 1 capsule by mouth daily with lunch. Qunol Joint Comfort With Turmeric Capsules   valsartan (DIOVAN) 320 MG tablet Take 1 tablet (320 mg total) by mouth daily. (Patient taking differently: Take 320 mg by mouth at bedtime.)   ZINC GLUCONATE PO Take 5 mg by mouth daily.    Allergies:   Patient has no known allergies.   Social History   Socioeconomic History   Marital status: Widowed    Spouse name: Not on file   Number of children: 2   Years of education: Not on file   Highest education level: Some college, no degree  Occupational History    Comment: retired Photographer  Tobacco Use   Smoking status: Never   Smokeless tobacco: Never  Vaping Use   Vaping status: Never Used   Substance and Sexual Activity   Alcohol use: Not Currently    Comment: Occasional   Drug use: Never   Sexual activity: Never    Birth control/protection: Surgical  Other Topics Concern  Not on file  Social History Narrative   Moved here in 2019, had been living in [place].    Born here, raised daughters here.    Lives with daughter now, Jessie.   Lost one daughter in car wreck.    Social Drivers of Corporate investment banker Strain: Low Risk  (01/03/2024)   Received from [facility]   Overall Financial Resource Strain (CARDIA)    Difficulty of Paying Living Expenses: Not hard at all  Food Insecurity: No Food Insecurity (01/03/2024)   Received from [facility]   Hunger Vital Sign    Worried About Running Out of Food in the Last Year: Never true    Ran Out of Food in the Last Year: Never true  Transportation Needs: No Transportation Needs (01/03/2024)   Received from [facility] - Transportation    In the past 12 months, has lack of transportation kept you from medical appointments or from getting medications?: No    Lack of Transportation (Non-Medical): No  Physical Activity: Insufficiently Active (09/07/2023)   Exercise Vital Sign    Days of Exercise per Week: 3 days    Minutes of Exercise per Session: 20 min  Stress: No Stress Concern Present (09/07/2023)   [employer] of Occupational Health - Occupational Stress Questionnaire    Feeling of Stress : Only a little  Social Connections: Moderately Integrated (09/07/2023)   Social Connection and Isolation Panel [NHANES]    Frequency of Communication with Friends and Family: More than three times a week    Frequency of Social Gatherings with Friends and Family: More than three times a week    Attends Religious Services: More than 4 times per year    Active Member of [employer] or Organizations: Yes    Attends Banker Meetings: More than 4 times per  year    Marital Status: Widowed     Family History:  The patient's family history includes Heart Problems in her brother, father, and mother; Heart attack in her brother and mother. There is no history of Colon cancer or Breast cancer.  ROS:   12-point review of systems is negative unless otherwise noted in the HPI.   EKGs/Labs/Other Studies Reviewed:    Studies reviewed were summarized above. The additional studies were reviewed today:  Limited echo 12/13/2023: 1. Left ventricular ejection fraction, by estimation, is 55 to 60%. The  left ventricle has normal function. The left ventricle demonstrates  regional wall motion abnormalities (septal motion consistent with bundle  branch block). Left ventricular  diastolic parameters are consistent with Grade I diastolic dysfunction  (impaired relaxation).   2. Right ventricular systolic function is normal. The right ventricular  size is normal. Tricuspid regurgitation signal is inadequate for assessing  PA pressure.   3. The mitral valve is normal in structure. Mild mitral valve  regurgitation. No evidence of mitral stenosis. Moderate mitral annular  calcification.   4. The aortic valve is calcified. There is mild calcification of the  aortic valve. Aortic valve regurgitation is not visualized. Aortic valve  sclerosis/calcification is present, without any evidence of aortic  stenosis. Aortic valve mean gradient  measures 8.0 mmHg.   5. The inferior vena cava is normal in size with greater than 50%  respiratory variability, suggesting right atrial pressure of 3 mmHg.  ___________  [facility] 10/15/2023: 1.  Patent coronary arteries with diffuse LAD and RCA calcification, mild nonobstructive plaquing in the LAD  and left circumflex, and patent stents in the RCA with only mild in-stent restenosis 2.  Essentially normal right heart catheterization with mean RA pressure 6 mmHg, PA pressure 33 over 15 mmHg, wedge pressure of 10 mmHg, and preserved  cardiac output of 4.7 L/min.   Recommendations: Patient appears to be well compensated from a cardiac perspective.  She has no high-grade obstructive CAD.  Continue medical therapy. __________   2D echo 07/25/2023: 1. Left ventricular ejection fraction, by estimation, is 60 to 65%. The  left ventricle has normal function. The left ventricle has no regional  wall motion abnormalities. There is mild left ventricular hypertrophy.  Left ventricular diastolic parameters  are consistent with Grade I diastolic dysfunction (impaired relaxation).  The average left ventricular global longitudinal strain is -15.6 %.   2. Right ventricular systolic function is normal. The right ventricular  size is normal. Tricuspid regurgitation signal is inadequate for assessing  PA pressure.   3. Left atrial size was moderately dilated.   4. The mitral valve is normal in structure. Mild mitral valve  regurgitation. No evidence of mitral stenosis. Moderate mitral annular  calcification.   5. The aortic valve is normal in structure. There is mild calcification  of the aortic valve. Aortic valve regurgitation is not visualized. Aortic  valve sclerosis/calcification is present, without any evidence of aortic  stenosis.   6. The inferior vena cava is normal in size with greater than 50%  respiratory variability, suggesting right atrial pressure of 3 mmHg.  __________   Eugenie Birks MPI 06/28/2023: Pharmacological myocardial perfusion imaging study with no significant  ischemia Normal wall motion, EF estimated at 70% No EKG changes concerning for ischemia at peak stress or in recovery. Left bundle branch block noted during infusion that by report returned to narrow complex QRS in recovery CT attenuation correction images with three-vessel coronary calcification, moderate aortic atherosclerosis Low risk scan __________   Carotid artery ultrasound 05/28/2023: Summary:  Right Carotid: Velocities in the right ICA are  consistent with a 1-39% stenosis.   Left Carotid: The ECA appears >50% stenosed. Patent stent without obvious evidence of stenosis.   Vertebrals: Bilateral vertebral arteries demonstrate antegrade flow.  __________   Carotid artery ultrasound 04/10/2023: Summary:  Right Carotid: Velocities in the right ICA are consistent with a 1-39% stenosis.   Left Carotid: Velocities in the left ICA are consistent with a 80-99% stenosis. The ECA appears >50% stenosed.  __________   2D echo 06/22/2022: 1. Left ventricular ejection fraction, by estimation, is 60 to 65%. The  left ventricle has normal function. The left ventricle has no regional  wall motion abnormalities. There is moderate left ventricular hypertrophy.  Left ventricular diastolic  parameters are consistent with Grade I diastolic dysfunction (impaired  relaxation).   2. Right ventricular systolic function is normal. The right ventricular  size is normal.   3. Left atrial size was mildly dilated.   4. The mitral valve is normal in structure. Mild mitral valve  regurgitation. No evidence of mitral stenosis.   5. The aortic valve is normal in structure. Aortic valve regurgitation is  not visualized. Aortic valve sclerosis/calcification is present, without  any evidence of aortic stenosis.   6. The inferior vena cava is normal in size with greater than 50%  respiratory variability, suggesting right atrial pressure of 3 mmHg. __________   LHC 05/24/2022:   Mid RCA lesion is 10% stenosed.   Prox RCA to Mid RCA lesion is 95% stenosed.   Prox  LAD to Mid LAD lesion is 20% stenosed.   A drug-eluting stent was successfully placed using a STENT ONYX FRONTIER [id number].   Post intervention, there is a 0% residual stenosis.   1.  Moderately to severely calcified coronary arteries with significant one-vessel coronary artery disease.   There is 95% stenosis in the proximal/mid right coronary artery just before the previously placed stent.   2.   Left ventricular angiography was not performed due to chronic kidney disease.  LVEDP was at the upper limit of normal. 3.  Successful angioplasty and drug-eluting stent placement to the right coronary artery overlapping with the previously placed stent. 4.  Difficult catheterization via the right radial artery due to significant tortuosity of the innominate and right subclavian arteries.  Consider alternative route in the future.   Recommendations: Dual antiplatelet therapy for at least 6 months. Aggressive treatment of risk factors. __________   Carotid artery ultrasound 02/23/2022: Summary:  Right Carotid: Velocities in the right ICA are consistent with a 1-39%  stenosis. Non-hemodynamically significant plaque <50% noted in the  CCA. The ECA appears <50% stenosed.   Left Carotid: Velocities in the left ICA are consistent with a 60-79%  stenosis. Non-hemodynamically significant plaque <50% noted in the  CCA. The ECA appears >50% stenosed.      The bilateral lobes of the thyroid had a globuated  appearance   Suggest follow up study in 12 months.  __________   Renal artery ultrasound 07/16/2020: Summary:  Largest Aortic Diameter: 2.0 cm     Renal:     Right: Normal size right kidney. Normal right Resisitive Index.         Normal cortical thickness of right kidney. No evidence of         right renal artery stenosis. RRV flow present.  Left:  LRV flow present. No evidence of left renal artery stenosis.         Normal size of left kidney. Normal left Resistive Index.         Normal cortical thickness of the left kidney. __________   Outpatient cardiac monitoring 01/2020: Normal sinus rhythm with an average heart rate of 70 bpm. 1 short run of SVT lasted 4 beats. Frequent PVCs with a burden of 10.5%.  Ventricular bigeminy and trigeminy were present.   EKG:  EKG is ordered today.  The EKG ordered today demonstrates NSR, 67 bpm, first-degree AV block, LBBB  Recent  Labs: 10/24/2023: Magnesium 1.7 12/12/2023: TSH 6.130 02/19/2024: ALT 18; BUN 18; Creatinine, Ser 1.10; Hemoglobin 12.1; Platelets 247; Potassium 3.5; Sodium 140  Recent Lipid Panel    Component Value Date/Time   CHOL 116 04/19/2023 0437   CHOL 160 07/17/2022 0903   TRIG 116 04/19/2023 0437   HDL 54 04/19/2023 0437   HDL 69 07/17/2022 0903   CHOLHDL 2.1 04/19/2023 0437   VLDL 23 04/19/2023 0437   LDLCALC 39 04/19/2023 0437   LDLCALC 70 07/17/2022 0903    PHYSICAL EXAM:    VS:  BP 130/70 (BP Location: Right Arm)   Pulse 71   Ht 5' (1.524 m)   Wt 136 lb 9.6 oz (62 kg)   SpO2 96%   BMI 26.68 kg/m   BMI: Body mass index is 26.68 kg/m.  Physical Exam Vitals reviewed.  Constitutional:      Appearance: She is well-developed.  HENT:     Head: Normocephalic and atraumatic.  Eyes:     General:        Right  eye: No discharge.        Left eye: No discharge.  Cardiovascular:     Rate and Rhythm: Normal rate and regular rhythm.     Heart sounds: S1 normal and S2 normal. Heart sounds not distant. No midsystolic click and no opening snap. Murmur heard.     Systolic murmur is present with a grade of 2/6 at the upper right sternal border.     No friction rub.  Pulmonary:     Effort: Pulmonary effort is normal. No respiratory distress.     Breath sounds: Normal breath sounds. No decreased breath sounds, wheezing, rhonchi or rales.  Chest:     Chest wall: No tenderness.  Musculoskeletal:     Cervical back: Normal range of motion.     Right lower leg: No edema.     Left lower leg: No edema.  Skin:    General: Skin is warm and dry.     Nails: There is no clubbing.  Neurological:     Mental Status: She is alert and oriented to person, place, and time.  Psychiatric:        Speech: Speech normal.        Behavior: Behavior normal.        Thought Content: Thought content normal.        Judgment: Judgment normal.     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  She continues to note an improvement in dyspnea and functional status.  Recent LHC showed patent stents in the RCA with otherwise mild nonobstructive CAD involving the left coronary tree.  Continue aggressive risk factor modification and secondary prevention including aspirin, clopidogrel (on hold for surgery as outlined below), amlodipine, and rosuvastatin.  No indication for further ischemic testing at this time.  HTN: Blood pressure is well-controlled in the office today.  She remains on amlodipine 10 mg, spironolactone 25 mg, and valsartan 320 mg.  Labs obtained earlier today show stable to improved renal function and stable potassium.  HLD: LDL 39 in 04/2023 with normal AST/ALT in 09/2023.  She remains on rosuvastatin 20 mg.  PVCs: Asymptomatic.  No longer on beta-blocker with noted fatigue and prior bradycardia.  Intermittent LBBB: No symptoms of presyncope or syncope.  Recent echo showed preserved LV systolic function.  Recent LHC showed patent RCA stents with otherwise nonobstructive CAD.  Carotid artery stenosis: Status post left TCAR in 04/2023 with ultrasound in 05/2023 showing patent stent with no obvious evidence of stenosis.  Remains on aspirin, clopidogrel, and rosuvastatin as outlined above.  Followed by vascular surgery.  History of CVA: No new deficits.  She remains on aspirin, clopidogrel, and rosuvastatin.  History of GI bleed/iron deficiency anemia: CBC obtained earlier today showed normalization of Hgb.  No symptoms concerning for bleeding.  CKD stage III: Improved on most recent labs.  Avoid nephrotoxic agents.  Followed by nephrology.  Murmur: Consistent with aortic valve sclerosis with recent echo showing no evidence of stenosis.  Mild mitral regurgitation  can be monitored periodically.  No indication for intervention at this time.  Preop: Scheduled for shoulder arthroplasty on 02/26/2024.  Per [facility] activity status index, she can achieve greater than 4 METS without cardiac limitation.  Per revised cardiac risk index she is class I risk with an estimated rate of 6% for adverse cardiac event in the periprocedural timeframe.  She is without symptoms of angina or cardiac decompensation.  Recent cardiac catheter showed patent stents in the RCA with mild nonobstructive CAD involving the left coronary tree.  She may proceed with noncardiac surgery at an overall class I risk without further cardiac testing.  Recommend clopidogrel be held 5 days prior to surgery (that she will begin holding clopidogrel on 3/19) with recommendation for clopidogrel to be resumed as soon as safely possible in the postoperative timeframe at the discretion of orthopedic surgery.  Recommend she continue aspirin 81 mg daily throughout the perioperative timeframe.    Disposition: F/u with Dr. Kirke Corin or an APP in 3 months.   Medication Adjustments/Labs and Tests Ordered: Current medicines are reviewed at length with the patient today.  Concerns regarding medicines are outlined above. Medication changes, Labs and Tests ordered today are summarized above and listed in the Patient Instructions accessible in Encounters.   Signed, Eula Listen, PA-C 02/19/2024 5:31 PM     [facility] [place] [address] [place], [place] [address] [phone]

## 2024-02-19 ENCOUNTER — Other Ambulatory Visit: Payer: Self-pay

## 2024-02-19 ENCOUNTER — Ambulatory Visit: Payer: Medicare Other | Attending: Physician Assistant | Admitting: Physician Assistant

## 2024-02-19 ENCOUNTER — Encounter
Admission: RE | Admit: 2024-02-19 | Discharge: 2024-02-19 | Disposition: A | Discharge status: Home / Self Care | Source: Ambulatory Visit | Attending: Surgery | Admitting: Surgery

## 2024-02-19 VITALS — BP 166/79 | HR 66 | Resp 20 | Ht 60.0 in | Wt 136.0 lb

## 2024-02-19 VITALS — BP 130/70 | HR 71 | Ht 60.0 in | Wt 136.6 lb

## 2024-02-19 DIAGNOSIS — I25118 Atherosclerotic heart disease of native coronary artery with other forms of angina pectoris: Secondary | ICD-10-CM | POA: Diagnosis not present

## 2024-02-19 DIAGNOSIS — Z01818 Encounter for other preprocedural examination: Secondary | ICD-10-CM

## 2024-02-19 DIAGNOSIS — Z01812 Encounter for preprocedural laboratory examination: Secondary | ICD-10-CM | POA: Insufficient documentation

## 2024-02-19 DIAGNOSIS — I493 Ventricular premature depolarization: Secondary | ICD-10-CM

## 2024-02-19 DIAGNOSIS — I10 Essential (primary) hypertension: Secondary | ICD-10-CM | POA: Diagnosis not present

## 2024-02-19 DIAGNOSIS — Z01810 Encounter for preprocedural cardiovascular examination: Secondary | ICD-10-CM

## 2024-02-19 DIAGNOSIS — E785 Hyperlipidemia, unspecified: Secondary | ICD-10-CM | POA: Diagnosis not present

## 2024-02-19 DIAGNOSIS — N1831 Chronic kidney disease, stage 3a: Secondary | ICD-10-CM | POA: Diagnosis not present

## 2024-02-19 DIAGNOSIS — I447 Left bundle-branch block, unspecified: Secondary | ICD-10-CM

## 2024-02-19 DIAGNOSIS — R011 Cardiac murmur, unspecified: Secondary | ICD-10-CM

## 2024-02-19 DIAGNOSIS — I6523 Occlusion and stenosis of bilateral carotid arteries: Secondary | ICD-10-CM

## 2024-02-19 DIAGNOSIS — Z8719 Personal history of other diseases of the digestive system: Secondary | ICD-10-CM

## 2024-02-19 DIAGNOSIS — Z8673 Personal history of transient ischemic attack (TIA), and cerebral infarction without residual deficits: Secondary | ICD-10-CM

## 2024-02-19 DIAGNOSIS — N183 Chronic kidney disease, stage 3 unspecified: Secondary | ICD-10-CM

## 2024-02-19 HISTORY — DX: Gastro-esophageal reflux disease without esophagitis: K21.9

## 2024-02-19 LAB — COMPREHENSIVE METABOLIC PANEL
ALT: 18 U/L (ref 0–44)
AST: 26 U/L (ref 15–41)
Albumin: 3.8 g/dL (ref 3.5–5.0)
Alkaline Phosphatase: 43 U/L (ref 38–126)
Anion gap: 10 (ref 5–15)
BUN: 18 mg/dL (ref 8–23)
CO2: 26 mmol/L (ref 22–32)
Calcium: 9.6 mg/dL (ref 8.9–10.3)
Chloride: 104 mmol/L (ref 98–111)
Creatinine, Ser: 1.1 mg/dL — ABNORMAL HIGH (ref 0.44–1.00)
GFR, Estimated: 49 mL/min — ABNORMAL LOW (ref >60)
Glucose, Bld: 125 mg/dL — ABNORMAL HIGH (ref 70–99)
Potassium: 3.5 mmol/L (ref 3.5–5.1)
Sodium: 140 mmol/L (ref 135–145)
Total Bilirubin: 0.6 mg/dL (ref 0.0–1.2)
Total Protein: 7 g/dL (ref 6.5–8.1)

## 2024-02-19 LAB — CBC WITH DIFFERENTIAL/PLATELET
Abs Immature Granulocytes: 0.07 10*3/uL (ref 0.00–0.07)
Basophils Absolute: 0 10*3/uL (ref 0.0–0.1)
Basophils Relative: 1 %
Eosinophils Absolute: 0.3 10*3/uL (ref 0.0–0.5)
Eosinophils Relative: 4 %
HCT: 35.3 % — ABNORMAL LOW (ref 36.0–46.0)
Hemoglobin: 12.1 g/dL (ref 12.0–15.0)
Immature Granulocytes: 1 %
Lymphocytes Relative: 18 %
Lymphs Abs: 1.4 10*3/uL (ref 0.7–4.0)
MCH: 30.6 pg (ref 26.0–34.0)
MCHC: 34.3 g/dL (ref 30.0–36.0)
MCV: 89.4 fL (ref 80.0–100.0)
Monocytes Absolute: 0.8 10*3/uL (ref 0.1–1.0)
Monocytes Relative: 10 %
Neutro Abs: 5.2 10*3/uL (ref 1.7–7.7)
Neutrophils Relative %: 66 %
Platelets: 247 10*3/uL (ref 150–400)
RBC: 3.95 MIL/uL (ref 3.87–5.11)
RDW: 13 % (ref 11.5–15.5)
WBC: 7.8 10*3/uL (ref 4.0–10.5)
nRBC: 0 % (ref 0.0–0.2)

## 2024-02-19 LAB — SURGICAL PCR SCREEN
MRSA, PCR: NEGATIVE
Staphylococcus aureus: POSITIVE — AB

## 2024-02-19 NOTE — Patient Instructions (Addendum)
 Medication Instructions:  Your physician recommends the following medication changes.  STOP TAKING:  Hold Plavix for Surgery 02/20/24    START TAKING:  Resume Plavix per surgery  *If you need a refill on your cardiac medications before your next appointment, please call your pharmacy*   Lab Work: No labs ordered today  If you have labs (blood work) drawn today and your tests are completely normal, you will receive your results only by: MyChart Message (if you have MyChart) OR A paper copy in the mail If you have any lab test that is abnormal or we need to change your treatment, we will call you to review the results.   Testing/Procedures:  No test ordered today    Follow-Up: At [facility], you and your health needs are our priority.  As part of our continuing mission to provide you with exceptional heart care, we have created designated Provider Care Teams.  These Care Teams include your primary Cardiologist (physician) and Advanced Practice Providers (APPs -  Physician Assistants and Nurse Practitioners) who all work together to provide you with the care you need, when you need it.   Your next appointment:   3 month(s)  Provider:   You may see Lorine Bears, MD or one of the following Advanced Practice Providers on your designated Care Team:   Nicolasa Ducking, NP Eula Listen, PA-C Cadence Fransico Michael, PA-C Charlsie Quest, NP Carlos Levering, NP

## 2024-02-19 NOTE — Patient Instructions (Addendum)
 Your procedure is scheduled on: Tuesday 02/26/24 To find out your arrival time, please call [phone] between 1PM - 3PM on: Monday 02/25/24   Report to the Registration Desk on the 1st floor of the [facility]. FREE Valet parking is available.  If your arrival time is 6:00 am, do not arrive before that time as the [facility] entrance doors do not open until 6:00 am.  REMEMBER: Instructions that are not followed completely may result in serious medical risk, up to and including death; or upon the discretion of your surgeon and anesthesiologist your surgery may need to be rescheduled.  Do not eat food after midnight the night before surgery.  No gum chewing or hard candies.  You may however, drink CLEAR liquids up to 2 hours before you are scheduled to arrive for your surgery. Do not drink anything within 2 hours of your scheduled arrival time.  Clear liquids include: - water  - apple juice without  - gatorade (not RED colors) - black coffee or tea (Do NOT add milk or creamers to the coffee or tea) Do NOT drink anything that is not on this list.  In addition, your doctor has ordered for you to drink the provided:  Ensure Pre-Surgery Clear Carbohydrate Drink  Drinking this carbohydrate drink up to two hours before surgery helps to reduce insulin resistance and improve patient outcomes. Please complete drinking 2 hours before scheduled arrival time.  One week prior to surgery: Stop Anti-inflammatories (NSAIDS) such as Advil, Aleve, Ibuprofen, Motrin, Naproxen, Naprosyn and Aspirin based products such as Excedrin, Goody's Powder, BC Powder. You may however, continue to take Tylenol if needed for pain up until the day of surgery.  Stop ANY OVER THE COUNTER supplements and vitamins for 7 days until after surgery. Zinc, Tumeric, Multivitamin, Vitamin D  Continue taking all prescribed medications with the exception of the following: Plavix/aspirin, hold for surgery as instructed by  cardiology.  TAKE ONLY THESE MEDICATIONS THE MORNING OF SURGERY WITH A SIP OF WATER:  Amlodipine Levothyroxine Omeprazole Antacid (take one the night before and one on the morning of surgery - helps to prevent nausea after surgery.) Rosuvastatin  Use inhalers on the day of surgery and bring to the hospital.  No Alcohol for 24 hours before or after surgery.  No Smoking including e-cigarettes for 24 hours before surgery.  No chewable tobacco products for at least 6 hours before surgery.  No nicotine patches on the day of surgery.  Do not use any "recreational" drugs for at least a week (preferably 2 weeks) before your surgery.  Please be advised that the combination of cocaine and anesthesia may have negative outcomes, up to and including death. If you test positive for cocaine, your surgery will be cancelled.  On the morning of surgery brush your teeth with toothpaste and water, you may rinse your mouth with mouthwash if you wish. Do not swallow any toothpaste or mouthwash.  Use CHG Soap or wipes as directed on instruction sheet.  Do not wear lotions, powders, or perfumes. No Deodorant on the day of surgery  Do not shave body hair from the neck down 48 hours before surgery.  Wear comfortable clothing (specific to your surgery type) to the hospital.  Do not wear jewelry, make-up, hairpins, clips or nail polish.  For welded (permanent) jewelry: bracelets, anklets, waist bands, etc.  Please have this removed prior to surgery.  If it is not removed, there is a chance that hospital personnel will need  to cut it off on the day of surgery. Contact lenses, hearing aids and dentures may not be worn into surgery.  Do not bring valuables to the hospital. [facility] is not responsible for any missing/lost belongings or valuables.   Total Shoulder Arthroplasty:  use Benzoyl Peroxide 5% Gel as directed on instruction sheet.  Notify your doctor if there is any change in your medical  condition (cold, fever, infection).  If you are being discharged the day of surgery, you will not be allowed to drive home. You will need a responsible individual to drive you home and stay with you for 24 hours after surgery.   If you are taking public transportation, you will need to have a responsible individual with you.  If you are being admitted to the hospital overnight, leave your suitcase in the car. After surgery it may be brought to your room.  In case of increased patient census, it may be necessary for you, the patient, to continue your postoperative care in the Same Day Surgery department.  After surgery, you can help prevent lung complications by doing breathing exercises.  Take deep breaths and cough every 1-2 hours. Your doctor may order a device called an Incentive Spirometer to help you take deep breaths.  Surgery Visitation Policy:  Patients undergoing a surgery or procedure may have two family members or support persons with them as long as the person is not COVID-19 positive or experiencing its symptoms.   Inpatient Visitation:    Visiting hours are 7 a.m. to 8 p.m. Up to four visitors are allowed at one time in a patient room. The visitors may rotate out with other people during the day. One designated support person (adult) may remain overnight.  Due to an increase in RSV and influenza rates and associated hospitalizations, children ages 42 and under will not be able to visit patients in [facility]. Masks continue to be strongly recommended.  Please call the Pre-admissions Testing Dept. at [phone] if you have any questions about these instructions.    Pre-operative 5 CHG Bath Instructions   You can play a key role in reducing the risk of infection after surgery. Your skin needs to be as free of germs as possible. You can reduce the number of germs on your skin by washing with CHG (chlorhexidine gluconate) soap before surgery. CHG is an  antiseptic soap that kills germs and continues to kill germs even after washing.   DO NOT use if you have an allergy to chlorhexidine/CHG or antibacterial soaps. If your skin becomes reddened or irritated, stop using the CHG and notify one of our RNs at [phone].   Please shower with the CHG soap starting 4 days before surgery using the following schedule:   Friday 02/22/24 - Tuesday 02/26/24    Please keep in mind the following:  DO NOT shave, including legs and underarms, starting the day of your first shower.   You may shave your face at any point before/day of surgery.  Place clean sheets on your bed the day you start using CHG soap. Use a clean washcloth (not used since being washed) for each shower. DO NOT sleep with pets once you start using the CHG.   CHG Shower Instructions:  If you choose to wash your hair and private area, wash first with your normal shampoo/soap.  After you use shampoo/soap, rinse your hair and body thoroughly to remove shampoo/soap residue.  Turn the water OFF and apply about 3  tablespoons (45 ml) of CHG soap to a CLEAN washcloth.  Apply CHG soap ONLY FROM YOUR NECK DOWN TO YOUR TOES (washing for 3-5 minutes)  DO NOT use CHG soap on face, private areas, open wounds, or sores.  Pay special attention to the area where your surgery is being performed.  If you are having back surgery, having someone wash your back for you may be helpful. Wait 2 minutes after CHG soap is applied, then you may rinse off the CHG soap.  Pat dry with a clean towel  Put on clean clothes/pajamas   If you choose to wear lotion, please use ONLY the CHG-compatible lotions on the back of this paper.     Additional instructions for the day of surgery: DO NOT APPLY any lotions, deodorants, cologne, or perfumes.   Put on clean/comfortable clothes.  Brush your teeth.  Ask your nurse before applying any prescription medications to the skin.      CHG Compatible Lotions   Aveeno  Moisturizing lotion  Cetaphil Moisturizing Cream  Cetaphil Moisturizing Lotion  Clairol Herbal Essence Moisturizing Lotion, Dry Skin  Clairol Herbal Essence Moisturizing Lotion, Extra Dry Skin  Clairol Herbal Essence Moisturizing Lotion, Normal Skin  Curel Age Defying Therapeutic Moisturizing Lotion with Alpha Hydroxy  Curel Extreme Care Body Lotion  Curel Soothing Hands Moisturizing Hand Lotion  Curel Therapeutic Moisturizing Cream, Fragrance-Free  Curel Therapeutic Moisturizing Lotion, Fragrance-Free  Curel Therapeutic Moisturizing Lotion, Original Formula  Eucerin Daily Replenishing Lotion  Eucerin Dry Skin Therapy Plus Alpha Hydroxy Crme  Eucerin Dry Skin Therapy Plus Alpha Hydroxy Lotion  Eucerin Original Crme  Eucerin Original Lotion  Eucerin Plus Crme Eucerin Plus Lotion  Eucerin TriLipid Replenishing Lotion  Keri Anti-Bacterial Hand Lotion  Keri Deep Conditioning Original Lotion Dry Skin Formula Softly Scented  Keri Deep Conditioning Original Lotion, Fragrance Free Sensitive Skin Formula  Keri Lotion Fast Absorbing Fragrance Free Sensitive Skin Formula  Keri Lotion Fast Absorbing Softly Scented Dry Skin Formula  Keri Original Lotion  Keri Skin Renewal Lotion Keri Silky Smooth Lotion  Keri Silky Smooth Sensitive Skin Lotion  Nivea Body Creamy Conditioning Oil  Nivea Body Extra Enriched Lotion  Nivea Body Original Lotion  Nivea Body Sheer Moisturizing Lotion Nivea Crme  Nivea Skin Firming Lotion  NutraDerm 30 Skin Lotion  NutraDerm Skin Lotion  NutraDerm Therapeutic Skin Cream  NutraDerm Therapeutic Skin Lotion  ProShield Protective Hand Cream  Provon moisturizing lotion  Preparing for Total Shoulder Arthroplasty  Before surgery, you can play an important role by reducing the number of germs on your skin by using the following products:  Benzoyl Peroxide Gel  o Reduces the number of germs present on the skin  o Applied twice a day to shoulder area starting two  days before surgery  Chlorhexidine Gluconate (CHG) Soap  o An antiseptic cleaner that kills germs and bonds with the skin to continue killing germs even after washing  o Used for showering the night before surgery and morning of surgery  BENZOYL PEROXIDE 5% GEL  Please do not use if you have an allergy to benzoyl peroxide. If your skin becomes reddened/irritated stop using the benzoyl peroxide.  Starting two days before surgery, apply as follows:  1. Apply benzoyl peroxide in the morning and at night. Apply after taking a shower. If you are not taking a shower, clean entire shoulder front, back, and side along with the armpit with a clean wet washcloth.  2. Place a quarter-sized dollop on your shoulder and  rub in thoroughly, making sure to cover the front, back, and side of your shoulder, along with the armpit.  2 days before (Sunday 02/24/24)  ____ AM ____ PM   1 day before (Monday 02/25/24) ____ AM ____ PM  3. Do this twice a day for two days. (Last application is the night before surgery, AFTER using the CHG soap).  4. Do NOT apply benzoyl peroxide gel on the day of surgery.

## 2024-02-20 ENCOUNTER — Encounter: Payer: Self-pay | Admitting: Surgery

## 2024-02-20 DIAGNOSIS — Z01812 Encounter for preprocedural laboratory examination: Secondary | ICD-10-CM | POA: Diagnosis not present

## 2024-02-20 LAB — URINALYSIS, ROUTINE W REFLEX MICROSCOPIC
Bilirubin Urine: NEGATIVE
Glucose, UA: NEGATIVE mg/dL
Hgb urine dipstick: NEGATIVE
Ketones, ur: NEGATIVE mg/dL
Leukocytes,Ua: NEGATIVE
Nitrite: NEGATIVE
Protein, ur: 100 mg/dL — AB
Specific Gravity, Urine: 1.011 (ref 1.005–1.030)
pH: 7 (ref 5.0–8.0)

## 2024-02-21 LAB — TSH: TSH: 0.608 u[IU]/mL (ref 0.450–4.500)

## 2024-02-21 NOTE — Progress Notes (Signed)
 Perioperative / Anesthesia Services  Pre-Admission Testing Clinical Review / Pre-Operative Anesthesia Consult  Date: 02/21/24  Patient Demographics:  Name: Andrea Hart DOB: [birth year] MRN:   [id number]  Planned Surgical Procedure(s):    Case: [id number] Date/Time: 02/26/24 1020   Procedure: ARTHROPLASTY, SHOULDER, TOTAL, REVERSE (Left: Shoulder) - MAKE SECOND CASE   Anesthesia type: Choice   Diagnosis:      Primary osteoarthritis of left shoulder [M19.012]     Rotator cuff tendonitis, left [M75.82]   Pre-op diagnosis:      Primary osteoarthritis of left shoulder M19.012     Rotator cuff tendinitis, left M75.82   Location: [facility] OR ROOM 02 / [facility]   Surgeons: Christena Flake, MD      NOTE: Available PAT nursing documentation and vital signs have been reviewed. Clinical nursing staff has updated patient's PMH/PSHx, current medication list, and drug allergies/intolerances to ensure comprehensive history available to assist in medical decision making as it pertains to the aforementioned surgical procedure and anticipated anesthetic course. Extensive review of available clinical information personally performed. Andrea Hart PMH and PSHx updated with any diagnoses/procedures that  may have been inadvertently omitted during his intake with the pre-admission testing department's nursing staff.  Clinical Discussion:  Andrea Hart is a 88 y.o. female who is submitted for pre-surgical anesthesia review and clearance prior to her undergoing the above procedure. Patient has never been a smoker in the past. Pertinent PMH includes: CAD, CVA x 2, cerebral microvascular disease, LBBB, PVCs, aortic atherosclerosis, carotid artery disease (s/p LEFT TCAR), HTN, HLD, hypothyroidism, CKD-III, asthma, GERD (on NaHCO3 tablets PRN), hiatal hernia (s/p Nissen fundoplication), OA, LEFT rotator cuff tendinitis, cervical DDD.  Patient is followed by cardiology Kirke Corin, MD). She was last seen in  the cardiology clinic on 02/19/2024; notes reviewed. At the time of her clinic visit, patient doing well overall from a cardiovascular perspective.  Patient complained of progressive dependent edema in her lower extremities that is improved with extremity elevation.  Patient denied any chest pain, shortness of breath, PND, orthopnea, palpitations, weakness, fatigue, vertiginous symptoms, or presyncope/syncope. Patient with a past medical history significant for cardiovascular diagnoses. Documented physical exam was grossly benign, providing no evidence of acute exacerbation and/or decompensation of the patient's known cardiovascular conditions.  Of note, complete records regarding patient's cardiovascular history unavailable for review at time of consult.  Patient has received care in the state of [place].  Information gathered from available records, verbal patient report, and from notes provided by her local care team.  Coronary CTA was performed on 02/12/2015 that demonstrated an Agatston coronary artery calcium score of 182. This placed patient in the 25 percentile for age, sex, and race matched controls. Calcium depositions causing a subtotal occlusion in the mid RCA.  There was also moderate calcifications noted in the LAD.  Study demonstrates normal coronary origin with RIGHT dominance.  Patient underwent diagnostic LEFT heart catheterization while living in [facility] for stable angina on 02/12/2015.  Study revealed a subtotal occlusion of the mid RCA.  She underwent PCI and placement of a Resolute Integrity DES x 1 (unknown size) to the mid RCA yielding excellent angiographic result and TIMI-3 flow.  Patient underwent repeat diagnostic LEFT heart catheterization on 12/24/2021 revealing multivessel CAD; 10% mid RCA, 95% proximal-mid RCA, and 20% proximal-mid LAD.  PCI was performed placing a 3.0 x 18 mm Onyx Frontier DES x 1 overlapping the previously placed stent to the proximal-mid RCA.  Procedure yielded excellent angiographic result and TIMI-3 flow.  Per report, patient has suffered 2 separate CVAs in the past.  Most recent MRI imaging of the brain performed on 02/10/2022 revealed evidence of a small chronic LEFT thalamic infarct.  Patient with stable chronic microvascular ischemic changes.  Following her neurovascular events, patient has no significant residual deficits.  Patient with known BILATERAL carotid artery disease (L >R).  Patient ultimately underwent a LEFT transcarotid artery revascularization (TCAR) procedure on 04/18/2023.  Myocardial perfusion imaging study was performed on 06/28/2023 revealing a normal left ventricular systolic function with a hyperdynamic LVEF of 70%.  Left bundle branch block was noted during infusion that returned to narrow complex QRS in recovery.  There was no evidence of stress-induced myocardial ischemia.  CT attenuation correction images demonstrated three-vessel coronary artery calcifications and moderate aortic atherosclerosis.  Overall, study was determined to be low risk.  Diagnostic RIGHT heart catheterization was performed on 10/15/2023 revealing multivessel CAD; 25% proximal-mid LAD, 30-40% mid LCx, and 30-35% in-stent restenosis of the previously placed stents to the mid RCA.  Hemodynamics: mean RA = 6 mmHg, mean PA = 22 mmHg, mean PCWP = 10 mmHg, CO = 4.72 L/min, and CI 2.93 L/min/m.  Patient appeared to be well compensated from a cardiac perspective with no high-grade obstructive CAD.  Given her essentially normal RIGHT heart catheterization, continued aggressive medical therapy was recommended.  Most recent TTE performed on 12/13/2023 revealed a normal left ventricular systolic function with an EF of 55-60%. There septal motion abnormality consistent with known LBBB. Left ventricular diastolic Doppler parameters consistent with abnormal relaxation (G1DD). GLS -12.3%. Right ventricular size and function normal. There was moderate mitral  annular calcification.  Aortic valve was sclerotic/calcified.  There was mild mitral and tricuspid valve regurgitation. All transvalvular gradients were noted to be normal providing no evidence suggestive of valvular stenosis. Aorta normal in size with no evidence of ectasia or aneurysmal dilatation.  Given patient's CVA and history of PAD, patient remains on daily DAPT therapy using ASA + clopidogrel.  Patient is reported be compliant with therapy with no evidence or reports of GI bleeding. Blood pressure reasonably controlled at 130/70 mmHg on currently prescribed CCB (amlodipine), diuretic (Parona lactone), and ARB (losartan) therapies.  Patient is on rosuvastatin for her HLD diagnosis and ASCVD prevention. Patient is not diabetic. She does not have an OSAH diagnosis. Patient is able to complete all of her  ADL/IADLs without cardiovascular limitation.  Per the DASI, patient is able to achieve at least 4 METS of physical activity without experiencing any significant degree of angina/anginal equivalent symptoms. No changes were made to her medication regimen during her visit with cardiology.  Patient scheduled to follow-up with outpatient cardiology in 3 months or sooner if needed.  Andrea Hart is scheduled for an elective ARTHROPLASTY, SHOULDER, TOTAL, REVERSE (Left: Shoulder) on 02/26/2024 with Dr. Leron Croak, MD.  Given patient's past medical history significant for cardiovascular diagnoses, presurgical cardiac clearance was sought by the PAT team.  Per cardiology, "per the [facility] activity status index, she can achieve greater than 4 METS without cardiac limitation. Per revised cardiac risk index she is class I risk with an estimated rate of 6% for adverse cardiac event in the periprocedural timeframe. She is without symptoms of angina or cardiac decompensation. Recent cardiac catheter showed patent stents in the RCA with mild nonobstructive CAD involving the left coronary tree. She may proceed with  noncardiac surgery at an overall class I risk without further  cardiac testing".  In review of the patient's chart, it is noted that she is on daily oral antithrombotic therapy. She has been instructed on recommendations for holding her clopidogrel for 5 days prior to her procedure with plans to restart as soon as postoperative bleeding risk felt to be minimized by his attending surgeon. The patient has been instructed that her last dose of clopidogrel should be on 02/20/2024. Given that patient's past medical history is significant for cardiovascular diagnoses, including but not limited to CAD, orthopedics has cleared patient to continue her daily low dose ASA throughout her perioperative course. She will be asked to hold her normal dose on the day of her procedure only. Patient has been updated on these directives from her specialty care providers by the PAT team.  Patient denies previous perioperative complications with anesthesia in the past. In review her EMR, it is noted that patient underwent a general anesthetic course at [facility] ([facility]) in 04/2023 without documented complications.      02/19/2024    3:10 PM 02/19/2024   10:03 AM 12/17/2023    9:58 AM  Vitals with BMI  Height 5\' 0"  5\' 0"  5\' 0"   Weight 136 lbs 10 oz 136 lbs 137 lbs 13 oz  BMI 26.68 26.56 26.91  Systolic 130 166 914  Diastolic 70 79 76  Pulse 71 66 73   Providers/Specialists:  NOTE: Primary physician provider listed below. Patient may have been seen by APP or partner within same practice.   PROVIDER ROLE / SPECIALTY LAST OV  Poggi, Excell Seltzer, MD Orthopedics (Surgeon) 02/20/2024  Erasmo Downer, MD Primary Care Provider 12/17/2023  Lorine Bears, MD Cardiology 02/19/2024   Allergies:  No Known Allergies Current Home Medications:   No current facility-administered medications for this encounter.    acetaminophen (TYLENOL) 500 MG tablet   albuterol (VENTOLIN HFA) 108 (90 Base) MCG/ACT inhaler    amLODipine (NORVASC) 10 MG tablet   aspirin EC 81 MG tablet   cetirizine (ZYRTEC) 10 MG tablet   Cholecalciferol (VITAMIN D3) 125 MCG (5000 UT) TABS   clobetasol (TEMOVATE) 0.05 % external solution   clopidogrel (PLAVIX) 75 MG tablet   FeFum-FePoly-FA-B Cmp-C-Biot (FOLIVANE-PLUS) CAPS   ketoconazole (NIZORAL) 2 % shampoo   levothyroxine (SYNTHROID) 100 MCG tablet   methocarbamol (ROBAXIN) 500 MG tablet   montelukast (SINGULAIR) 10 MG tablet   Multiple Vitamin (MULTIVITAMIN WITH MINERALS) TABS tablet   omeprazole (PRILOSEC) 40 MG capsule   oxybutynin (DITROPAN-XL) 5 MG 24 hr tablet   rosuvastatin (CRESTOR) 20 MG tablet   sodium bicarbonate 650 MG tablet   spironolactone (ALDACTONE) 25 MG tablet   triamcinolone cream (KENALOG) 0.1 %   TURMERIC PO   valsartan (DIOVAN) 320 MG tablet   budesonide-formoterol (SYMBICORT) 160-4.5 MCG/ACT inhaler   ZINC GLUCONATE PO    albuterol (PROVENTIL) (2.5 MG/3ML) 0.083% nebulizer solution 2.5 mg   History:   Past Medical History:  Diagnosis Date   Abdominal hernia    Aortic atherosclerosis (HCC)    Arthritis    Asthma    Carotid arterial disease (HCC)    a.) 11/2020 Carotid U/S: Less than 50% RICA, 50-69% LICA; b.) s/p LEFT TCAR 04/18/2023   Cerebral microvascular disease    CKD (chronic kidney disease), stage III (HCC)    Coronary artery disease 02/2015   a.) cCTA 02/2015: Ca2+ 182 (68th %'ile); b.) PCI 02/12/2015 in [place] done for stable angina --> subtotal occlusion mRCA (Resolute Integrity DES x 1);  c.) LHC/PCI 05/24/2022: 10% mRCA, 95% p-mRCA (3.0 x 18 mm Onyx Frontier DES overlapping previous stent), 20% p-mLAD; d.) RHC 10/18/2023: 25% p-mLAD, 30-40% mLCx, 30-35% ISR mRCA - med mgmt   DDD (degenerative disc disease), cervical    GERD (gastroesophageal reflux disease)    GI bleeding 02/02/2021   History of blood transfusion    History of hiatal hernia (s/p Nissen fundoplication repair)    Hyperlipidemia     Hypertension    Hypothyroidism    LBBB (left bundle branch block)    Long term current use of clopidogrel    Long-term use of aspirin therapy    Pneumonia    PVC's (premature ventricular contractions)    a. 01/2020 Zio: RSR, 70, frequent PVCs with 10.5% burden.   Stroke [facility]) 2017   a. 2017 - ILR did not show afib.   Stroke [facility])    a.) date unknown; has had CVAs x 2   Past Surgical History:  Procedure Laterality Date   ABDOMINAL HYSTERECTOMY     APPENDECTOMY     CATARACT EXTRACTION Bilateral    CHOLECYSTECTOMY     COLONOSCOPY WITH PROPOFOL N/A 04/06/2021   Procedure: COLONOSCOPY WITH PROPOFOL;  Surgeon: Toledo, Boykin Nearing, MD;  Location: [facility] ENDOSCOPY;  Service: Gastroenterology;  Laterality: N/A;   CORONARY ANGIOPLASTY WITH STENT PLACEMENT Left 02/12/2015   Procedure: CORONARY ANGIOPLASTY WITH STENT PLACEMENT; Location: [facility] [place].   CORONARY STENT INTERVENTION N/A 05/24/2022   Procedure: CORONARY STENT INTERVENTION;  Surgeon: Iran Ouch, MD;  Location: [facility] INVASIVE CV LAB;  Service: Cardiovascular;  Laterality: N/A;   EYE SURGERY     HAND SURGERY Right    LEFT HEART CATH AND CORONARY ANGIOGRAPHY Left 05/24/2022   Procedure: LEFT HEART CATH AND CORONARY ANGIOGRAPHY;  Surgeon: Iran Ouch, MD;  Location: [facility] INVASIVE CV LAB;  Service: Cardiovascular;  Laterality: Left;   LOOP RECORDER IMPLANT     NISSEN FUNDOPLICATION  2016   REPLACEMENT TOTAL KNEE Bilateral 2017   RIGHT HEART CATH AND CORONARY ANGIOGRAPHY N/A 10/15/2023   Procedure: RIGHT HEART CATH AND CORONARY ANGIOGRAPHY;  Surgeon: Tonny Bollman, MD;  Location: [facility] INVASIVE CV LAB;  Service: Cardiovascular;  Laterality: N/A;   TOTAL HIP ARTHROPLASTY Right    TOTAL VAGINAL HYSTERECTOMY     TRANSCAROTID ARTERY REVASCULARIZATION  Left 04/18/2023   Procedure: LEFT Transcarotid Artery Revascularization;  Surgeon: Nada Libman, MD;  Location: [facility] OR;  Service: Vascular;  Laterality: Left;   ULTRASOUND  GUIDANCE FOR VASCULAR ACCESS  04/18/2023   Procedure: ULTRASOUND GUIDANCE FOR VASCULAR ACCESS;  Surgeon: Nada Libman, MD;  Location: [facility] OR;  Service: Vascular;;   Family History  Problem Relation Age of Onset   Heart Problems Mother    Heart attack Mother    Heart Problems Father    Heart attack Brother    Heart Problems Brother    Colon cancer Neg Hx    Breast cancer Neg Hx    Social History   Tobacco Use   Smoking status: Never   Smokeless tobacco: Never  Substance Use Topics   Alcohol use: Not Currently    Comment: Occasional   Pertinent Clinical Results:  LABS:  Preadmission on 02/26/2024  Component Date Value Ref Range Status   Color, Urine 02/20/2024 STRAW (A)  YELLOW Final   APPearance 02/20/2024 CLEAR (A)  CLEAR Final   Specific Gravity, Urine 02/20/2024 1.011  1.005 - 1.030 Final   pH 02/20/2024 7.0  5.0 - 8.0 Final  Glucose, UA 02/20/2024 NEGATIVE  NEGATIVE mg/dL Final   Hgb urine dipstick 02/20/2024 NEGATIVE  NEGATIVE Final   Bilirubin Urine 02/20/2024 NEGATIVE  NEGATIVE Final   Ketones, ur 02/20/2024 NEGATIVE  NEGATIVE mg/dL Final   Protein, ur 82/95/6213 100 (A)  NEGATIVE mg/dL Final   Nitrite 08/65/7846 NEGATIVE  NEGATIVE Final   Leukocytes,Ua 02/20/2024 NEGATIVE  NEGATIVE Final   RBC / HPF 02/20/2024 0-5  0 - 5 RBC/hpf Final   WBC, UA 02/20/2024 0-5  0 - 5 WBC/hpf Final   Bacteria, UA 02/20/2024 RARE (A)  NONE SEEN Final   Squamous Epithelial / HPF 02/20/2024 0-5  0 - 5 /HPF Final   Mucus 02/20/2024 PRESENT   Final   Performed at [facility], [address] [place], [place] [address]  Hospital Outpatient Visit on 02/19/2024  Component Date Value Ref Range Status   WBC 02/19/2024 7.8  4.0 - 10.5 K/uL Final   RBC 02/19/2024 3.95  3.87 - 5.11 MIL/uL Final   Hemoglobin 02/19/2024 12.1  12.0 - 15.0 g/dL Final   HCT 28/41/3244 35.3 (L)  36.0 - 46.0 % Final   MCV 02/19/2024 89.4  80.0 - 100.0 fL Final   MCH 02/19/2024 30.6  26.0 - 34.0 pg  Final   MCHC 02/19/2024 34.3  30.0 - 36.0 g/dL Final   RDW 12/06/7251 13.0  11.5 - 15.5 % Final   Platelets 02/19/2024 247  150 - 400 K/uL Final   nRBC 02/19/2024 0.0  0.0 - 0.2 % Final   Neutrophils Relative % 02/19/2024 66  % Final   Neutro Abs 02/19/2024 5.2  1.7 - 7.7 K/uL Final   Lymphocytes Relative 02/19/2024 18  % Final   Lymphs Abs 02/19/2024 1.4  0.7 - 4.0 K/uL Final   Monocytes Relative 02/19/2024 10  % Final   Monocytes Absolute 02/19/2024 0.8  0.1 - 1.0 K/uL Final   Eosinophils Relative 02/19/2024 4  % Final   Eosinophils Absolute 02/19/2024 0.3  0.0 - 0.5 K/uL Final   Basophils Relative 02/19/2024 1  % Final   Basophils Absolute 02/19/2024 0.0  0.0 - 0.1 K/uL Final   Immature Granulocytes 02/19/2024 1  % Final   Abs Immature Granulocytes 02/19/2024 0.07  0.00 - 0.07 K/uL Final   Performed at [facility], [address] [place], [place] [address]   Sodium 02/19/2024 140  135 - 145 mmol/L Final   Potassium 02/19/2024 3.5  3.5 - 5.1 mmol/L Final   Chloride 02/19/2024 104  98 - 111 mmol/L Final   CO2 02/19/2024 26  22 - 32 mmol/L Final   Glucose, Bld 02/19/2024 125 (H)  70 - 99 mg/dL Final   Glucose reference range applies only to samples taken after fasting for at least 8 hours.   BUN 02/19/2024 18  8 - 23 mg/dL Final   Creatinine, Ser 02/19/2024 1.10 (H)  0.44 - 1.00 mg/dL Final   Calcium 34/74/2595 9.6  8.9 - 10.3 mg/dL Final   Total Protein 63/87/5643 7.0  6.5 - 8.1 g/dL Final   Albumin 32/95/1884 3.8  3.5 - 5.0 g/dL Final   AST 16/60/6301 26  15 - 41 U/L Final   ALT 02/19/2024 18  0 - 44 U/L Final   Alkaline Phosphatase 02/19/2024 43  38 - 126 U/L Final   Total Bilirubin 02/19/2024 0.6  0.0 - 1.2 mg/dL Final   GFR, Estimated 02/19/2024 49 (L)  >60 mL/min Final   Comment: (NOTE) Calculated using the CKD-EPI  Creatinine Equation (2021)    Anion gap 02/19/2024 10  5 - 15 Final   Performed at [facility], [address] [place], [place]  [address]   MRSA, PCR 02/19/2024 NEGATIVE  NEGATIVE Final   Staphylococcus aureus 02/19/2024 POSITIVE (A)  NEGATIVE Final   Comment: (NOTE) The Xpert SA Assay (FDA approved for NASAL specimens in patients 94 years of age and older), is one component of a comprehensive surveillance program. It is not intended to diagnose infection nor to guide or monitor treatment. Performed at [facility], [address] [place], [place] [address]     ECG: Date: 02/19/2024  Time ECG obtained: 1606 PM Rate: 67 bpm Rhythm: normal sinus with first degree AV block; LBBB Intervals: PR 172 ms. QRS 144 ms. QTc 479 ms. ST segment and T wave changes: No evidence of acute T wave abnormalities or significant ST segment elevation or depression.  Evidence of a possible, age undetermined, prior infarct:  No Comparison: Similar to previous tracing obtained on 11/20/2023   IMAGING / PROCEDURES: CT SHOULDER LEFT WO CONTRAST performed on 01/30/2024 Severe osteoarthritis of the left glenohumeral joint. Aortic atherosclerosis  DIAGNOSTIC RADIOGRAPHS OF COMPLETE LEFT SHOULDER performed on 01/03/2024 The humeral head is well within the glenoid, no acute sign location.   On all views, there is noticeable and severe osteoarthritic changes at the glenohumeral joint.  With complete invasion of the humeral head well and that of the glenoid with initial signs of glenoid breakdown.   There is noticeable subchondral changes appreciated along both the humeral and glenoid aspects as well as cystic changes and osteophyte formation present.   No fractures, lytic lesions or gross deformities appreciated.   DIAGNOSTIC RADIOGRAPHS OF CERVICAL SPINE 2-3 VIEWS performed on 01/03/2024 There does not appear to be any lateralization of vertebrae, there does appear to be a subclinical, very mild form of scoliosis in the upper thoracic vertebra.   Upon inspection of the lateral views, there is a noticeable hyperlordotic and forward  offsetting feature to the patient's cervical region, most notably protruding at the C1, C2 and C3 regions.   There is severe degenerative disc changes appreciated.   Potential listhesis of the C2 vertebra.   No fractures, lytic lesions or gross deformities appreciated on films.   TRANSTHORACIC ECHOCARDIOGRAM performed on 12/13/2023 Left ventricular ejection fraction, by estimation, is 55 to 60%. The  left ventricle has normal function. The left ventricle demonstrates  regional wall motion abnormalities (septal motion consistent with bundle  branch block). Left ventricular  diastolic parameters are consistent with Grade I diastolic dysfunction  (impaired relaxation). Right ventricular systolic function is normal. The right ventricular size is normal. Tricuspid regurgitation signal is inadequate for assessing PA pressure.  The mitral valve is normal in structure. Mild mitral valve regurgitation. No evidence of mitral stenosis. Moderate mitral annular calcification.  The aortic valve is calcified. There is mild calcification of the aortic valve. Aortic valve regurgitation is not visualized. Aortic valve sclerosis/calcification is present, without any evidence of aortic stenosis. Aortic valve mean gradient measures 8.0 mmHg.  The inferior vena cava is normal in size with greater than 50% respiratory variability, suggesting right atrial pressure of 3 mmHg.   RIGHT HEART CATHETERIZATION AND CORONARY ANGIOGRAPHY performed on 10/15/2023 Normal left ventricular systolic function and EF Multivessel CAD 25% proximal-mid LAD 30-40% mid LCx 30-35% ISR the previously placed stents to the mid RCA Hemodynamics Fick Cardiac Output 4.72 L/min  Fick Cardiac Output Index 2.93 (L/min)/BSA  RA A Wave 9 mmHg  RA V Wave 8 mmHg  RA Mean 6 mmHg  RV Systolic Pressure 37 mmHg  RV Diastolic Pressure 5 mmHg  RV EDP 9 mmHg  PA Systolic Pressure 33 mmHg  PA Diastolic Pressure 15 mmHg  PA Mean 22 mmHg  PW A Wave 12  mmHg  PW V Wave 13 mmHg  PW Mean 10 mmHg  AO Systolic Pressure 160 mmHg  AO Diastolic Pressure 75 mmHg  AO Mean 110 mmHg  QP/QS 1  TPVR Index 7.5 HRUI  TSVR Index 37.51 HRUI  PVR SVR Ratio 0.12  TPVR/TSVR Ratio 0.2    PULMONARY FUNCTION TESTING performed on 09/11/2023    Latest Ref Rng & Units 09/11/2023    2:22 PM  PFT Results  FVC-Pre L 1.68   FVC-Predicted Pre % 87   FVC-Post L 1.65   FVC-Predicted Post % 86   Pre FEV1/FVC % % 75   Post FEV1/FCV % % 80   FEV1-Pre L 1.26   FEV1-Predicted Pre % 90   FEV1-Post L 1.32   DLCO uncorrected ml/min/mmHg 11.85   DLCO UNC% % 73   DLVA Predicted % 92   TLC L 3.71   TLC % Predicted % 83   RV % Predicted % 84    MYOCARDIAL PERFUSION IMAGING STUDY (LEXISCAN) performed on 06/28/2023 Normal wall motion, EF estimated at 70% No EKG changes concerning for ischemia at peak stress or in recovery. Left bundle branch block noted during infusion that by report returned to narrow complex QRS in recovery CT attenuation correction images with three-vessel coronary calcification, moderate aortic atherosclerosis Pharmacological myocardial perfusion imaging study with no significant ischemia  Low risk scan  VAS US CAROTID performed on 05/28/2023 Velocities in the right ICA are consistent with a 1-39% stenosis.  The left ECA appears >50% stenosed. Patent stent without obvious evidence of stenosis.  Bilateral vertebral arteries demonstrate antegrade flow.   Impression and Plan:  Andrea Hart has been referred for pre-anesthesia review and clearance prior to her undergoing the planned anesthetic and procedural courses. Available labs, pertinent testing, and imaging results were personally reviewed by me in preparation for upcoming operative/procedural course. [facility] medical record has been updated following extensive record review and patient interview with PAT staff.   This patient has been appropriately cleared by cardiology with an overall  ACCEPTABLE risk of experiencing significant perioperative cardiovascular complications. Based on clinical review performed today (02/21/24), barring any significant acute changes in the patient's overall condition, it is anticipated that she will be able to proceed with the planned surgical intervention. Any acute changes in clinical condition may necessitate her procedure being postponed and/or cancelled. Patient will meet with anesthesia team (MD and/or CRNA) on the day of her procedure for preoperative evaluation/assessment. Questions regarding anesthetic course will be fielded at that time.   Pre-surgical instructions were reviewed with the patient during his PAT appointment, and questions were fielded to satisfaction by PAT clinical staff. She has been instructed on which medications that she will need to hold prior to surgery, as well as the ones that have been deemed safe/appropriate to take on the day of his procedure. As part of the general education provided by PAT, patient made aware both verbally and in writing, that she would need to abstain from the use of any illegal substances during his perioperative course. She was advised that failure to follow the provided instructions could necessitate case cancellation or result in serious perioperative complications  up to and including death. Patient encouraged to contact PAT and/or her surgeon's office to discuss any questions or concerns that may arise prior to surgery; verbalized understanding.   Quentin Mulling, MSN, APRN, FNP-C, CEN [facility]  Perioperative Services Nurse Practitioner Phone: [phone] Fax: [phone] 02/21/24 8:24 AM  NOTE: This note has been prepared using Dragon dictation software. Despite my best ability to proofread, there is always the potential that unintentional transcriptional errors may still occur from this process.

## 2024-02-25 ENCOUNTER — Encounter: Payer: Self-pay | Admitting: Physician Assistant

## 2024-02-25 MED ORDER — LACTATED RINGERS IV SOLN
INTRAVENOUS | Status: DC
Start: 2024-02-25 — End: 2024-02-26

## 2024-02-25 MED ORDER — TRANEXAMIC ACID-NACL 1000-0.7 MG/100ML-% IV SOLN
1000.0000 mg | INTRAVENOUS | Status: DC
Start: 2024-02-25 — End: 2024-02-26

## 2024-02-25 MED ORDER — CEFAZOLIN SODIUM-DEXTROSE 2-4 GM/100ML-% IV SOLN
2.0000 g | INTRAVENOUS | Status: AC
Start: 2024-02-26 — End: 2024-02-27
  Administered 2024-02-26: 2 g via INTRAVENOUS

## 2024-02-25 MED ORDER — CHLORHEXIDINE GLUCONATE 0.12 % MT SOLN
15.0000 mL | Freq: Once | OROMUCOSAL | Status: AC
  Administered 2024-02-26: 15 mL via OROMUCOSAL

## 2024-02-25 MED ORDER — ORAL CARE MOUTH RINSE: 15.0000 mL | Freq: Once | OROMUCOSAL | Status: AC

## 2024-02-26 ENCOUNTER — Ambulatory Visit: Payer: Self-pay | Admitting: Urgent Care

## 2024-02-26 ENCOUNTER — Other Ambulatory Visit: Payer: Self-pay

## 2024-02-26 ENCOUNTER — Ambulatory Visit
Admission: RE | Admit: 2024-02-26 | Discharge: 2024-02-26 | Disposition: A | Discharge status: Home / Self Care | Attending: Surgery | Admitting: Surgery

## 2024-02-26 ENCOUNTER — Encounter
Admission: RE | Disposition: A | Discharge status: Home / Self Care | Payer: Self-pay | Source: Home / Self Care | Attending: Surgery

## 2024-02-26 ENCOUNTER — Ambulatory Visit

## 2024-02-26 ENCOUNTER — Encounter: Payer: Self-pay | Admitting: Surgery

## 2024-02-26 DIAGNOSIS — I700 Atherosclerosis of aorta: Secondary | ICD-10-CM | POA: Insufficient documentation

## 2024-02-26 DIAGNOSIS — I493 Ventricular premature depolarization: Secondary | ICD-10-CM | POA: Diagnosis not present

## 2024-02-26 DIAGNOSIS — I509 Heart failure, unspecified: Secondary | ICD-10-CM | POA: Insufficient documentation

## 2024-02-26 DIAGNOSIS — Z79899 Other long term (current) drug therapy: Secondary | ICD-10-CM | POA: Insufficient documentation

## 2024-02-26 DIAGNOSIS — Z9889 Other specified postprocedural states: Secondary | ICD-10-CM | POA: Diagnosis not present

## 2024-02-26 DIAGNOSIS — M75102 Unspecified rotator cuff tear or rupture of left shoulder, not specified as traumatic: Secondary | ICD-10-CM | POA: Insufficient documentation

## 2024-02-26 DIAGNOSIS — E039 Hypothyroidism, unspecified: Secondary | ICD-10-CM | POA: Insufficient documentation

## 2024-02-26 DIAGNOSIS — I447 Left bundle-branch block, unspecified: Secondary | ICD-10-CM | POA: Diagnosis not present

## 2024-02-26 DIAGNOSIS — E785 Hyperlipidemia, unspecified: Secondary | ICD-10-CM | POA: Diagnosis not present

## 2024-02-26 DIAGNOSIS — N183 Chronic kidney disease, stage 3 unspecified: Secondary | ICD-10-CM | POA: Insufficient documentation

## 2024-02-26 DIAGNOSIS — I2510 Atherosclerotic heart disease of native coronary artery without angina pectoris: Secondary | ICD-10-CM | POA: Insufficient documentation

## 2024-02-26 DIAGNOSIS — M19012 Primary osteoarthritis, left shoulder: Secondary | ICD-10-CM | POA: Insufficient documentation

## 2024-02-26 DIAGNOSIS — N1831 Chronic kidney disease, stage 3a: Secondary | ICD-10-CM

## 2024-02-26 DIAGNOSIS — Z8673 Personal history of transient ischemic attack (TIA), and cerebral infarction without residual deficits: Secondary | ICD-10-CM | POA: Diagnosis not present

## 2024-02-26 DIAGNOSIS — Z01812 Encounter for preprocedural laboratory examination: Secondary | ICD-10-CM

## 2024-02-26 DIAGNOSIS — K219 Gastro-esophageal reflux disease without esophagitis: Secondary | ICD-10-CM | POA: Insufficient documentation

## 2024-02-26 DIAGNOSIS — I130 Hypertensive heart and chronic kidney disease with heart failure and stage 1 through stage 4 chronic kidney disease, or unspecified chronic kidney disease: Secondary | ICD-10-CM | POA: Insufficient documentation

## 2024-02-26 DIAGNOSIS — Z01818 Encounter for other preprocedural examination: Secondary | ICD-10-CM

## 2024-02-26 HISTORY — DX: Long term (current) use of aspirin: Z79.82

## 2024-02-26 HISTORY — DX: Atherosclerosis of aorta: I70.0

## 2024-02-26 HISTORY — PX: REVERSE SHOULDER ARTHROPLASTY: SHX5054

## 2024-02-26 HISTORY — DX: Other cervical disc degeneration, unspecified cervical region: M50.30

## 2024-02-26 HISTORY — DX: Long term (current) use of antithrombotics/antiplatelets: Z79.02

## 2024-02-26 HISTORY — DX: Left bundle-branch block, unspecified: I44.7

## 2024-02-26 HISTORY — DX: Other cerebrovascular disease: I67.89

## 2024-02-26 SURGERY — ARTHROPLASTY, SHOULDER, TOTAL, REVERSE
Anesthesia: General | Site: Shoulder | Laterality: Left

## 2024-02-26 MED ORDER — SODIUM CHLORIDE 0.9 % IR SOLN
Status: DC | PRN
  Administered 2024-02-26: 3000 mL

## 2024-02-26 MED ORDER — MIDAZOLAM HCL 2 MG/2ML IJ SOLN
1.0000 mg | INTRAMUSCULAR | Status: DC | PRN
Start: 2024-02-26 — End: 2024-02-26
  Administered 2024-02-26: 1 mg via INTRAVENOUS

## 2024-02-26 MED ORDER — PROPOFOL 10 MG/ML IV BOLUS
INTRAVENOUS | Status: AC
  Filled 2024-02-26: qty 20

## 2024-02-26 MED ORDER — METOCLOPRAMIDE HCL 10 MG PO TABS: 5.0000 mg | ORAL_TABLET | Freq: Three times a day (TID) | ORAL | Status: DC | PRN

## 2024-02-26 MED ORDER — VALSARTAN 320 MG PO TABS
320.0000 mg | ORAL_TABLET | Freq: Every evening | ORAL | Status: DC
Start: 2024-02-26 — End: 2024-06-03

## 2024-02-26 MED ORDER — FENTANYL CITRATE (PF) 100 MCG/2ML IJ SOLN
INTRAMUSCULAR | Status: AC
  Filled 2024-02-26: qty 2

## 2024-02-26 MED ORDER — BUPIVACAINE HCL (PF) 0.5 % IJ SOLN
INTRAMUSCULAR | Status: DC | PRN
  Administered 2024-02-26: 10 mL

## 2024-02-26 MED ORDER — FENTANYL CITRATE PF 50 MCG/ML IJ SOSY
50.0000 ug | PREFILLED_SYRINGE | Freq: Once | INTRAMUSCULAR | Status: AC
  Administered 2024-02-26: 50 ug via INTRAVENOUS

## 2024-02-26 MED ORDER — ONDANSETRON HCL 4 MG/2ML IJ SOLN
4.0000 mg | Freq: Four times a day (QID) | INTRAMUSCULAR | Status: DC | PRN
Start: 2024-02-26 — End: 2024-02-26

## 2024-02-26 MED ORDER — FENTANYL CITRATE PF 50 MCG/ML IJ SOSY
PREFILLED_SYRINGE | INTRAMUSCULAR | Status: AC
  Filled 2024-02-26: qty 1

## 2024-02-26 MED ORDER — CETIRIZINE HCL 10 MG PO TABS: 10.0000 mg | ORAL_TABLET | Freq: Every day | ORAL | Status: AC

## 2024-02-26 MED ORDER — ACETAMINOPHEN 10 MG/ML IV SOLN
INTRAVENOUS | Status: DC | PRN
  Administered 2024-02-26: 1000 mg via INTRAVENOUS

## 2024-02-26 MED ORDER — FENTANYL CITRATE (PF) 100 MCG/2ML IJ SOLN
INTRAMUSCULAR | Status: DC | PRN
  Administered 2024-02-26 (×2): 50 ug via INTRAVENOUS

## 2024-02-26 MED ORDER — MUPIROCIN 2 % EX OINT: 1.0000 | TOPICAL_OINTMENT | Freq: Two times a day (BID) | CUTANEOUS | 0 refills | Status: AC

## 2024-02-26 MED ORDER — ACETAMINOPHEN 325 MG PO TABS: 325.0000 mg | ORAL_TABLET | Freq: Four times a day (QID) | ORAL | Status: DC | PRN

## 2024-02-26 MED ORDER — ROCURONIUM BROMIDE 100 MG/10ML IV SOLN
INTRAVENOUS | Status: DC | PRN
  Administered 2024-02-26: 50 mg via INTRAVENOUS
  Administered 2024-02-26: 20 mg via INTRAVENOUS

## 2024-02-26 MED ORDER — DEXAMETHASONE SODIUM PHOSPHATE 10 MG/ML IJ SOLN
INTRAMUSCULAR | Status: DC | PRN
  Administered 2024-02-26: 10 mg via INTRAVENOUS

## 2024-02-26 MED ORDER — OXYCODONE HCL 5 MG/5ML PO SOLN
5.0000 mg | Freq: Once | ORAL | Status: DC | PRN
Start: 2024-02-26 — End: 2024-02-26

## 2024-02-26 MED ORDER — DEXAMETHASONE SODIUM PHOSPHATE 10 MG/ML IJ SOLN
INTRAMUSCULAR | Status: AC
  Filled 2024-02-26: qty 1

## 2024-02-26 MED ORDER — OXYCODONE HCL 5 MG PO TABS: 2.5000 mg | ORAL_TABLET | ORAL | 0 refills | Status: DC | PRN

## 2024-02-26 MED ORDER — SODIUM CHLORIDE 0.9 % IV SOLN: INTRAVENOUS | Status: DC

## 2024-02-26 MED ORDER — OXYCODONE HCL 5 MG PO TABS: 5.0000 mg | ORAL_TABLET | Freq: Once | ORAL | Status: DC | PRN

## 2024-02-26 MED ORDER — PROPOFOL 10 MG/ML IV BOLUS
INTRAVENOUS | Status: DC | PRN
  Administered 2024-02-26: 110 mg via INTRAVENOUS

## 2024-02-26 MED ORDER — CEFAZOLIN SODIUM-DEXTROSE 2-4 GM/100ML-% IV SOLN
INTRAVENOUS | Status: AC
  Filled 2024-02-26: qty 100

## 2024-02-26 MED ORDER — BUPIVACAINE HCL (PF) 0.5 % IJ SOLN
INTRAMUSCULAR | Status: AC
  Filled 2024-02-26: qty 10

## 2024-02-26 MED ORDER — 0.9 % SODIUM CHLORIDE (POUR BTL) OPTIME
TOPICAL | Status: DC | PRN
  Administered 2024-02-26: 500 mL

## 2024-02-26 MED ORDER — CHLORHEXIDINE GLUCONATE 0.12 % MT SOLN
OROMUCOSAL | Status: AC
  Filled 2024-02-26: qty 15

## 2024-02-26 MED ORDER — MIDAZOLAM HCL 2 MG/2ML IJ SOLN
INTRAMUSCULAR | Status: AC
Start: 2024-02-26 — End: ?
  Filled 2024-02-26: qty 2

## 2024-02-26 MED ORDER — ONDANSETRON HCL 4 MG/2ML IJ SOLN
INTRAMUSCULAR | Status: DC | PRN
  Administered 2024-02-26: 4 mg via INTRAVENOUS

## 2024-02-26 MED ORDER — ONDANSETRON HCL 4 MG/2ML IJ SOLN
INTRAMUSCULAR | Status: AC
  Filled 2024-02-26: qty 2

## 2024-02-26 MED ORDER — SUGAMMADEX SODIUM 200 MG/2ML IV SOLN
INTRAVENOUS | Status: DC | PRN
  Administered 2024-02-26: 200 mg via INTRAVENOUS

## 2024-02-26 MED ORDER — BUPIVACAINE LIPOSOME 1.3 % IJ SUSP
INTRAMUSCULAR | Status: AC
  Filled 2024-02-26: qty 10

## 2024-02-26 MED ORDER — FENTANYL CITRATE (PF) 100 MCG/2ML IJ SOLN: 25.0000 ug | INTRAMUSCULAR | Status: DC | PRN

## 2024-02-26 MED ORDER — STERILE WATER FOR IRRIGATION IR SOLN
Status: DC | PRN
  Administered 2024-02-26: 1000 mL

## 2024-02-26 MED ORDER — EPHEDRINE 5 MG/ML INJ
INTRAVENOUS | Status: AC
  Filled 2024-02-26: qty 5

## 2024-02-26 MED ORDER — ROCURONIUM BROMIDE 10 MG/ML (PF) SYRINGE
PREFILLED_SYRINGE | INTRAVENOUS | Status: AC
  Filled 2024-02-26: qty 10

## 2024-02-26 MED ORDER — BUPIVACAINE-EPINEPHRINE (PF) 0.5% -1:200000 IJ SOLN
INTRAMUSCULAR | Status: DC | PRN
  Administered 2024-02-26: 40 mL

## 2024-02-26 MED ORDER — CHLORHEXIDINE GLUCONATE 4 % EX SOLN
1.0000 | CUTANEOUS | 1 refills | Status: DC
Start: 2024-02-26 — End: 2024-06-16

## 2024-02-26 MED ORDER — OXYCODONE HCL 5 MG PO TABS: 2.5000 mg | ORAL_TABLET | ORAL | Status: DC | PRN

## 2024-02-26 MED ORDER — LACTATED RINGERS IV SOLN: INTRAVENOUS | Status: DC | PRN

## 2024-02-26 MED ORDER — CEFAZOLIN SODIUM-DEXTROSE 2-4 GM/100ML-% IV SOLN
2.0000 g | Freq: Four times a day (QID) | INTRAVENOUS | Status: DC
  Administered 2024-02-26: 2 g via INTRAVENOUS

## 2024-02-26 MED ORDER — EPHEDRINE SULFATE-NACL 50-0.9 MG/10ML-% IV SOSY
PREFILLED_SYRINGE | INTRAVENOUS | Status: DC | PRN
  Administered 2024-02-26: 5 mg via INTRAVENOUS
  Administered 2024-02-26: 10 mg via INTRAVENOUS

## 2024-02-26 MED ORDER — BUPIVACAINE-EPINEPHRINE (PF) 0.5% -1:200000 IJ SOLN
INTRAMUSCULAR | Status: AC
  Filled 2024-02-26: qty 30

## 2024-02-26 MED ORDER — BUPIVACAINE LIPOSOME 1.3 % IJ SUSP
INTRAMUSCULAR | Status: DC | PRN
Start: 2024-02-26 — End: 2024-02-26
  Administered 2024-02-26: 10 mL

## 2024-02-26 MED ORDER — TRANEXAMIC ACID-NACL 1000-0.7 MG/100ML-% IV SOLN
INTRAVENOUS | Status: AC
  Filled 2024-02-26: qty 100

## 2024-02-26 MED ORDER — METOCLOPRAMIDE HCL 5 MG/ML IJ SOLN: 5.0000 mg | Freq: Three times a day (TID) | INTRAMUSCULAR | Status: DC | PRN

## 2024-02-26 MED ORDER — ACETAMINOPHEN 10 MG/ML IV SOLN
INTRAVENOUS | Status: AC
  Filled 2024-02-26: qty 100

## 2024-02-26 MED ORDER — PHENYLEPHRINE HCL-NACL 20-0.9 MG/250ML-% IV SOLN
INTRAVENOUS | Status: DC | PRN
  Administered 2024-02-26: 20 ug/min via INTRAVENOUS

## 2024-02-26 MED ORDER — ONDANSETRON HCL 4 MG PO TABS: 4.0000 mg | ORAL_TABLET | Freq: Four times a day (QID) | ORAL | Status: DC | PRN

## 2024-02-26 MED ORDER — SUGAMMADEX SODIUM 200 MG/2ML IV SOLN
INTRAVENOUS | Status: AC
  Filled 2024-02-26: qty 2

## 2024-02-26 SURGICAL SUPPLY — 65 items
BASEPLATE BOSS DRILL (MISCELLANEOUS) IMPLANT
BIT DRILL 2.5X4.5XSCR (BIT) IMPLANT
BIT DRILL F/BASEPLATE CENTRAL (BIT) IMPLANT
BIT DRL 2.5X4.5XSCR (BIT) ×1 IMPLANT
BLADE SAW SAG 25X90X1.19 (BLADE) ×1 IMPLANT
CHLORAPREP W/TINT 26 (MISCELLANEOUS) ×1 IMPLANT
COOLER POLAR GLACIER W/PUMP (MISCELLANEOUS) ×1 IMPLANT
DRAPE INCISE IOBAN 66X45 STRL (DRAPES) ×1 IMPLANT
DRAPE SHEET LG 3/4 BI-LAMINATE (DRAPES) ×1 IMPLANT
DRAPE TABLE BACK 80X90 (DRAPES) ×1 IMPLANT
DRILL BASEPLATE CENTRAL S (BIT) ×1 IMPLANT
DRSG OPSITE POSTOP 4X8 (GAUZE/BANDAGES/DRESSINGS) ×1 IMPLANT
ELECT BLADE 4.0 EZ CLEAN MEGAD (MISCELLANEOUS) ×1 IMPLANT
ELECT CAUTERY BLADE 6.4 (BLADE) ×1 IMPLANT
ELECT REM PT RETURN 9FT ADLT (ELECTROSURGICAL) ×1 IMPLANT
ELECTRODE BLDE 4.0 EZ CLN MEGD (MISCELLANEOUS) ×1 IMPLANT
ELECTRODE REM PT RTRN 9FT ADLT (ELECTROSURGICAL) ×1 IMPLANT
GAUZE XEROFORM 1X8 LF (GAUZE/BANDAGES/DRESSINGS) ×1 IMPLANT
GLENOSPHERE RSS 2 CONCENTRIC (Shoulder) IMPLANT
GLOVE BIO SURGEON STRL SZ7.5 (GLOVE) ×4 IMPLANT
GLOVE BIO SURGEON STRL SZ8 (GLOVE) ×4 IMPLANT
GLOVE BIOGEL PI IND STRL 8 (GLOVE) ×2 IMPLANT
GLOVE INDICATOR 8.0 STRL GRN (GLOVE) ×1 IMPLANT
GOWN STRL REUS W/ TWL LRG LVL3 (GOWN DISPOSABLE) ×2 IMPLANT
GOWN STRL REUS W/ TWL XL LVL3 (GOWN DISPOSABLE) ×1 IMPLANT
GUIDE PIN 2.0 X 150 (WIRE) IMPLANT
HANDLE YANKAUER SUCT OPEN TIP (MISCELLANEOUS) ×1 IMPLANT
HOOD PEEL AWAY T7 (MISCELLANEOUS) ×3 IMPLANT
KIT STABILIZATION SHOULDER (MISCELLANEOUS) ×1 IMPLANT
KIT TURNOVER KIT A (KITS) ×1 IMPLANT
LINER STD +0S RSS HXL (Liner) IMPLANT
MANIFOLD NEPTUNE II (INSTRUMENTS) ×1 IMPLANT
MASK FACE SPIDER DISP (MASK) ×1 IMPLANT
MAT ABSORB FLUID 56X50 GRAY (MISCELLANEOUS) ×1 IMPLANT
NDL MAYO CATGUT SZ1 (NEEDLE) IMPLANT
NDL SAFETY ECLIPSE 18X1.5 (NEEDLE) ×1 IMPLANT
NDL SPNL 20GX3.5 QUINCKE YW (NEEDLE) ×1 IMPLANT
NEEDLE MAYO CATGUT SZ1 (NEEDLE) IMPLANT
NEEDLE SPNL 20GX3.5 QUINCKE YW (NEEDLE) ×1 IMPLANT
PACK ARTHROSCOPY SHOULDER (MISCELLANEOUS) ×1 IMPLANT
PAD ARMBOARD POSITIONER FOAM (MISCELLANEOUS) ×1 IMPLANT
PAD WRAPON POLAR SHDR UNIV (MISCELLANEOUS) ×1 IMPLANT
PLATE BASE REVERSE RSS S (Plate) IMPLANT
PULSAVAC PLUS IRRIG FAN TIP (DISPOSABLE) ×1 IMPLANT
SCREW 4.5X15 RSS W CAP (Screw) IMPLANT
SCREW 4.5X25 RSS W CAP (Screw) IMPLANT
SCREW BODY REVERSE STD (Screw) IMPLANT
SLING ULTRA II LG (MISCELLANEOUS) IMPLANT
SLING ULTRA II M (MISCELLANEOUS) IMPLANT
SOL .9 NS 3000ML IRR UROMATIC (IV SOLUTION) ×1 IMPLANT
SPONGE T-LAP 18X18 ~~LOC~~+RFID (SPONGE) ×2 IMPLANT
STAPLER SKIN PROX 35W (STAPLE) ×1 IMPLANT
STEM PRES FIT SZ 12 TSS (Stem) IMPLANT
SUT ETHIBOND 0 MO6 C/R (SUTURE) ×1 IMPLANT
SUT FIBERWIRE #2 38 BLUE 1/2 (SUTURE) ×4 IMPLANT
SUT VIC AB 0 CT1 36 (SUTURE) ×1 IMPLANT
SUT VIC AB 2-0 CT1 TAPERPNT 27 (SUTURE) ×2 IMPLANT
SUTURE FIBERWR #2 38 BLUE 1/2 (SUTURE) ×4 IMPLANT
SYR 10ML LL (SYRINGE) ×1 IMPLANT
SYR 30ML LL (SYRINGE) ×1 IMPLANT
SYR TOOMEY 50ML (SYRINGE) ×1 IMPLANT
TIP FAN IRRIG PULSAVAC PLUS (DISPOSABLE) ×1 IMPLANT
TRAP FLUID SMOKE EVACUATOR (MISCELLANEOUS) ×1 IMPLANT
WATER STERILE IRR 500ML POUR (IV SOLUTION) ×1 IMPLANT
WRAPON POLAR PAD SHDR UNIV (MISCELLANEOUS) ×1 IMPLANT

## 2024-02-26 NOTE — Anesthesia Procedure Notes (Signed)
 Anesthesia Regional Block: Interscalene brachial plexus block   Pre-Anesthetic Checklist: , timeout performed,  Correct Patient, Correct Site, Correct Laterality,  Correct Procedure, Correct Position, site marked,  Risks and benefits discussed,  Surgical consent,  Pre-op evaluation,  At surgeon's request and post-op pain management  Laterality: Left  Prep: chloraprep       Needles:  Injection technique: Single-shot  Needle Type: Echogenic Needle     Needle Length: 4cm  Needle Gauge: 25     Additional Needles:   Procedures:,,,, ultrasound used (permanent image in chart),,    Narrative:  Start time: 02/26/2024 10:35 AM End time: 02/26/2024 10:37 AM Injection made incrementally with aspirations every 5 mL.  Performed by: Personally  Anesthesiologist: Stephanie Coup, MD  Additional Notes: Patient's chart reviewed and they were deemed appropriate candidate for procedure, at surgeon's request. Patient educated about risks, benefits, and alternatives of the block including but not limited to: temporary or permanent nerve damage, bleeding, infection, damage to surround tissues, pneumothorax, hemidiaphragmatic paralysis, unilateral Horner's syndrome, block failure, local anesthetic toxicity. Patient expressed understanding. A formal time-out was conducted consistent with institution rules.  Monitors were applied, and minimal sedation used (see nursing record). The site was prepped with skin prep and allowed to dry, and sterile gloves were used. A high frequency linear ultrasound probe with probe cover was utilized throughout. C5-7 nerve roots located and appeared anatomically normal, local anesthetic injected around them, and echogenic block needle trajectory was monitored throughout. Aspiration performed every 5ml. Lung and blood vessels were avoided. All injections were performed without resistance and free of blood and paresthesias. The patient tolerated the procedure well.  Injectate:  10ml exparel + 10ml 0.5% bupivacaine

## 2024-02-26 NOTE — Anesthesia Procedure Notes (Signed)
 Procedure Name: Intubation Date/Time: 02/26/2024 12:00 PM  Performed by: Lily Lovings, CRNAPre-anesthesia Checklist: Patient identified, Patient being monitored, Timeout performed, Emergency Drugs available and Suction available Patient Re-evaluated:Patient Re-evaluated prior to induction Oxygen Delivery Method: Circle system utilized Preoxygenation: Pre-oxygenation with 100% oxygen Induction Type: IV induction Ventilation: Mask ventilation without difficulty Laryngoscope Size: 3 and McGrath Grade View: Grade I Tube type: Oral Tube size: 6.5 mm Number of attempts: 1 Airway Equipment and Method: Stylet Placement Confirmation: ETT inserted through vocal cords under direct vision, positive ETCO2 and breath sounds checked- equal and bilateral Secured at: 20 cm Tube secured with: Tape Dental Injury: Teeth and Oropharynx as per pre-operative assessment  Comments: Soft bite block placed

## 2024-02-26 NOTE — Discharge Instructions (Addendum)
 Orthopedic discharge instructions: May shower with intact OpSite dressing once nerve block has worn off (Saturday).  Apply ice frequently to shoulder or use Polar Care device. Take oxycodone as prescribed when needed.  May supplement with ES Tylenol if necessary. Keep shoulder immobilizer on at all times except may remove for bathing purposes. Follow-up in 10-14 days or as scheduled.  Information for Discharge Teaching: EXPAREL (bupivacaine liposome injectable suspension)   Pain relief is important to your recovery. The goal is to control your pain so you can move easier and return to your normal activities as soon as possible after your procedure. Your physician may use several types of medicines to manage pain, swelling, and more.  Your surgeon or anesthesiologist gave you EXPAREL(bupivacaine) to help control your pain after surgery.  EXPAREL is a local anesthetic designed to release slowly over an extended period of time to provide pain relief by numbing the tissue around the surgical site. EXPAREL is designed to release pain medication over time and can control pain for up to 72 hours. Depending on how you respond to EXPAREL, you may require less pain medication during your recovery. EXPAREL can help reduce or eliminate the need for opioids during the first few days after surgery when pain relief is needed the most. EXPAREL is not an opioid and is not addictive. It does not cause sleepiness or sedation.   Important! A teal colored band has been placed on your arm with the date, time and amount of EXPAREL you have received. Please leave this armband in place for the full 96 hours following administration, and then you may remove the band. If you return to the hospital for any reason within 96 hours following the administration of EXPAREL, the armband provides important information that your health care providers to know, and alerts them that you have received this anesthetic.    Possible  side effects of EXPAREL: Temporary loss of sensation or ability to move in the area where medication was injected. Nausea, vomiting, constipation Rarely, numbness and tingling in your mouth or lips, lightheadedness, or anxiety may occur. Call your doctor right away if you think you may be experiencing any of these sensations, or if you have other questions regarding possible side effects.  Follow all other discharge instructions given to you by your surgeon or nurse. Eat a healthy diet and drink plenty of water or other fluids.  POLAR CARE INFORMATION  MassAdvertisement.it  How to use [employer] Polar Care [facility] Therapy System?  YouTube   [email or web address].uk  OPERATING INSTRUCTIONS  Start the product With dry hands, connect the transformer to the electrical connection located on the top of the cooler. Next, plug the transformer into an appropriate electrical outlet. The unit will automatically start running at this point.  To stop the pump, disconnect electrical power.  Unplug to stop the product when not in use. Unplugging the Polar Care unit turns it off. Always unplug immediately after use. Never leave it plugged in while unattended. Remove pad.    FIRST ADD WATER TO FILL LINE, THEN ICE---Replace ice when existing ice is almost melted  1 Discuss Treatment with your Licensed Health Care Practitioner and Use Only as Prescribed 2 Apply Insulation Barrier & Cold Therapy Pad 3 Check for Moisture 4 Inspect Skin Regularly  Tips and Trouble Shooting Usage Tips 1. Use cubed or chunked ice for optimal performance. 2. It is recommended to drain the Pad between uses. To drain the pad, hold the Pad upright with the  hose pointed toward the ground. Depress the black plunger and allow water to drain out. 3. You may disconnect the Pad from the unit without removing the pad from the affected area by depressing the silver tabs on the hose coupling and gently pulling the hoses apart.  The Pad and unit will seal itself and will not leak. Note: Some dripping during release is normal. 4. DO NOT RUN PUMP WITHOUT WATER! The pump in this unit is designed to run with water. Running the unit without water will cause permanent damage to the pump. 5. Unplug unit before removing lid.  TROUBLESHOOTING GUIDE Pump not running, Water not flowing to the pad, Pad is not getting cold 1. Make sure the transformer is plugged into the wall outlet. 2. Confirm that the ice and water are filled to the indicated levels. 3. Make sure there are no kinks in the pad. 4. Gently pull on the blue tube to make sure the tube/pad junction is straight. 5. Remove the pad from the treatment site and ll it while the pad is lying at; then reapply. 6. Confirm that the pad couplings are securely attached to the unit. Listen for the double clicks (Figure 1) to confirm the pad couplings are securely attached.  Leaks    Note: Some condensation on the lines, controller, and pads is unavoidable, especially in warmer climates. 1. If using a [employer] Polar Care Cold Therapy unit with a detachable Cold Therapy Pad, and a leak exists (other than condensation on the lines) disconnect the pad couplings. Make sure the silver tabs on the couplings are depressed before reconnecting the pad to the pump hose; then confirm both sides of the coupling are properly clicked in. 2. If the coupling continues to leak or a leak is detected in the pad itself, stop using it and call [employer] Customer Care at [phone].  Cleaning After use, empty and dry the unit with a soft cloth. Warm water and mild detergent may be used occasionally to clean the pump and tubes.  WARNING: The Polar Care Cube can be cold enough to cause serious injury, including full skin necrosis. Follow these Operating Instructions, and carefully read the Product Insert (see pouch on side of unit) and the Cold Therapy Pad Fitting Instructions (provided with each Cold Therapy  Pad) prior to use.       POLAR CARE INFORMATION  MassAdvertisement.it  How to use [facility] Polar Care [facility] Therapy System?  YouTube   ShippingScam.co.uk  OPERATING INSTRUCTIONS  Start the product With dry hands, connect the transformer to the electrical connection located on the top of the cooler. Next, plug the transformer into an appropriate electrical outlet. The unit will automatically start running at this point.  To stop the pump, disconnect electrical power.  Unplug to stop the product when not in use. Unplugging the Polar Care unit turns it off. Always unplug immediately after use. Never leave it plugged in while unattended. Remove pad.    FIRST ADD WATER TO FILL LINE, THEN ICE---Replace ice when existing ice is almost melted  1 Discuss Treatment with your Licensed Health Care Practitioner and Use Only as Prescribed 2 Apply Insulation Barrier & Cold Therapy Pad 3 Check for Moisture 4 Inspect Skin Regularly  Tips and Trouble Shooting Usage Tips 1. Use cubed or chunked ice for optimal performance. 2. It is recommended to drain the Pad between uses. To drain the pad, hold the Pad upright with the hose pointed toward the ground. Depress the  black plunger and allow water to drain out. 3. You may disconnect the Pad from the unit without removing the pad from the affected area by depressing the silver tabs on the hose coupling and gently pulling the hoses apart. The Pad and unit will seal itself and will not leak. Note: Some dripping during release is normal. 4. DO NOT RUN PUMP WITHOUT WATER! The pump in this unit is designed to run with water. Running the unit without water will cause permanent damage to the pump. 5. Unplug unit before removing lid.  TROUBLESHOOTING GUIDE Pump not running, Water not flowing to the pad, Pad is not getting cold 1. Make sure the transformer is plugged into the wall outlet. 2. Confirm that the ice and water are filled to the  indicated levels. 3. Make sure there are no kinks in the pad. 4. Gently pull on the blue tube to make sure the tube/pad junction is straight. 5. Remove the pad from the treatment site and ll it while the pad is lying at; then reapply. 6. Confirm that the pad couplings are securely attached to the unit. Listen for the double clicks (Figure 1) to confirm the pad couplings are securely attached.  Leaks    Note: Some condensation on the lines, controller, and pads is unavoidable, especially in warmer climates. 1. If using a [employer]reg Polar Care Cold Therapy unit with a detachable Cold Therapy Pad, and a leak exists (other than condensation on the lines) disconnect the pad couplings. Make sure the silver tabs on the couplings are depressed before reconnecting the pad to the pump hose; then confirm both sides of the coupling are properly clicked in. 2. If the coupling continues to leak or a leak is detected in the pad itself, stop using it and call [employer] Customer Care at [phone].  Cleaning After use, empty and dry the unit with a soft cloth. Warm water and mild detergent may be used occasionally to clean the pump and tubes.  WARNING: The Polar Care Cube can be cold enough to cause serious injury, including full skin necrosis. Follow these Operating Instructions, and carefully read the Product Insert (see pouch on side of unit) and the Cold Therapy Pad Fitting Instructions (provided with each Cold Therapy Pad) prior to use.

## 2024-02-26 NOTE — Op Note (Signed)
 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  Post-Op Diagnosis:   Same.  Procedure:   Reverse left total shoulder arthroplasty with biceps tenodesis.  Surgeon:   Maryagnes Amos, MD  Assistant:   Benjamine Mola, ST-C  Anesthesia:   General endotracheal with an interscalene block using Exparel placed preoperatively by the anesthesiologist.  Findings:   As above.  Complications:   None  EBL:   50 cc  Fluids:   400 cc crystalloid  UOP:   None  TT:   None  Drains:   None  Closure:   Staples  Implants:   All press-fit Integra system with a 12 mm stem, a standard metaphyseal body, a +0 mm humeral platform, a mini baseplate, and a 38 mm concentric +2 mm laterally offset glenosphere.  Brief Clinical Note:   The patient is an 88 year old female with a long history of gradually worsening pain and stiffness of her left shoulder. Her symptoms have progressed despite medications, activity modification, etc. Her history and examination are consistent with advanced degenerative joint disease with evidence of a rotator cuff tear and biceps tendinopathy. The patient presents at this time for a reverse left total shoulder arthroplasty with biceps tenodesis.  Procedure:   The patient underwent placement of an interscalene block using Exparel by the anesthesiologist in the preoperative holding area before being brought into the operating room and lain in the supine position. The patient then underwent general endotracheal intubation and anesthesia before the patient was repositioned in the beach chair position using the beach chair positioner. The left shoulder and upper extremity were prepped with ChloraPrep solution before being draped sterilely. Preoperative antibiotics were administered.   A timeout was performed to verify the appropriate surgical site before a  standard anterior approach to the shoulder was made through an approximately 4-5 inch incision. The incision was carried down through the subcutaneous tissues to expose the deltopectoral fascia. The interval between the deltoid and pectoralis muscles was identified and this plane developed, retracting the cephalic vein laterally with the deltoid muscle. The conjoined tendon was identified. Its lateral margin was dissected and the Kolbel self-retraining retractor inserted. The "three sisters" were identified and cauterized. Bursal tissues were removed to improve visualization.   The biceps tendon was identified near the inferior aspect of the bicipital groove. A soft tissue tenodesis was performed by attaching the biceps tendon to the adjacent pectoralis major tendon using two #0 Ethibond interrupted sutures. The biceps tendon was then transected just proximal to the tenodesis site. The subscapularis tendon was released from its attachment to the lesser tuberosity 1 cm proximal to its insertion and several tagging sutures placed. The inferior capsule was released with care after identifying and protecting the axillary nerve. The proximal humeral cut was made at approximately 20 of retroversion using the extra-medullary guide.   Attention was redirected to the glenoid. The labrum was debrided circumferentially before the center of the glenoid was identified. The guidewire was drilled into the glenoid neck using the appropriate guide. After verifying its position, it was overreamed with the mini-baseplate reamer to create a flat surface before the stem reamer was utilized. The superior and inferior peg sites were reamed using the appropriate guide to complete the glenoid preparation. The permanent mini-baseplate was impacted into place. It was stabilized with a 15 x 4.5 mm central screw and four peripheral  screws. Locking caps were placed over the superior and inferior screws. The permanent 38 mm concentric  glenosphere with +2 mm of lateral offset was then impacted into place and its Morse taper locking mechanism verified using manual distraction.  Attention was directed to the humeral side. The humeral canal was prepared utilizing the tapered stem reamers sequentially beginning with the 7 mm stem and progressing to a 12 mm stem. This demonstrated a good tight fit. The metaphyseal region was then prepared using the appropriate planar device. The trial stem and standard metaphyseal body were put together on the back table and a trial reduction performed using the +0 mm insert. With the +0 mm insert, the arm demonstrated excellent range of motion as the hand could be brought across the chest to the opposite shoulder and brought to the top of the patient's head and to the patient's ear. The shoulder appeared stable throughout this range of motion. The joint was dislocated and the trial components removed.   The permanent 12 mm stem with the standard body was impacted into place with care taken to maintain the appropriate version. A repeat trial reduction with the +0 mm insert again demonstrated excellent stability with the findings as described above. Therefore, the shoulder was re-dislocated and, after inserting the locking screw to secure the body to the stem, the permanent +0 mm insert impacted into place. After verifying its locking mechanism, the shoulder was relocated using two finger pressure and again placed through a range of motion with the findings as described above.  The wound was copiously irrigated with sterile saline solution using the jet lavage system before a solution of 10 cc of Exparel and 30 cc of 0.5% Sensorcaine with epinephrine was injected into the pericapsular and peri-incisional tissues to help with postoperative analgesia. The subscapularis tendon was reapproximated using #2 FiberWire interrupted sutures. The deltopectoral interval was closed using #0 Vicryl interrupted sutures before  the subcutaneous tissues were closed using 2-0 Vicryl interrupted sutures. The skin was closed using staples. A sterile occlusive dressing was applied to the wound before the arm was placed into a shoulder immobilizer with an abduction pillow. A Polar Care system also was applied to the shoulder. The patient was then transferred back to a hospital bed before being awakened, extubated, and returned to the recovery room in satisfactory condition after tolerating the procedure well.

## 2024-02-26 NOTE — Evaluation (Signed)
 Occupational Therapy Evaluation Patient Details Name: Andrea Hart MRN: [id number] DOB: 1936-04-25 Today's Date: 02/26/2024   History of Present Illness  87yo female s/p Reverse left total shoulder arthroplasty with biceps tenodesis on 02/26/24.   Clinical Impressions Patient was seen for an OT evaluation this date. Pt lives with her daughter in the walk up basement of their 2 story home. Pt ambulates with a small base QC at baseline but only outside. Indep at baseline except doesn't drive. Prior to surgery, pt was active and independent. Pt has orders for LUE to be immobilized and will be NWBing per MD. Patient presents with impaired strength/ROM, pain, and sensation to LUE. Endorses 1/10 L shoulder pain at time of evaluation. These impairments result in a decreased ability to perform self care tasks requiring mod assist for UB/LB dressing and bathing and max assist for application of polar care, compression stockings, and sling/immobilizer. Pt/dtr instructed in polar care mgt, compression stockings mgt, sling/immobilizer mgt, ROM exercises for LUE (with instructions for no shoulder exercises until full sensation has returned), LUE precautions, adaptive strategies for bathing/dressing/toileting/grooming, positioning and considerations for sleep, and home/routines modifications to maximize falls prevention, safety, and independence. Handout provided. OT adjusted sling/immobilizer and polar care to improve comfort, optimize positioning, and to maximize skin integrity/safety. Pt/dtr verbalized understanding of all education/training provided. Pt will benefit from additional follow up therapy for rehab of shoulder as arranged by surgeon upon discharge.     If plan is discharge home, recommend the following: A lot of help with bathing/dressing/bathroom;Assistance with cooking/housework;Assist for transportation    Functional Status Assessment   Patient has had a recent decline in their functional status  and demonstrates the ability to make significant improvements in function in a reasonable and predictable amount of time.     Equipment Recommendations None recommended by OT    Precautions/Restrictions  Precautions Precautions: Shoulder;Fall Shoulder Interventions: Shoulder sling/immobilizer;Shoulder abduction pillow;At all times;Off for dressing/bathing/exercises Precaution Booklet Issued: Yes (comment) Recall of Precautions/Restrictions: Intact Restrictions Weight Bearing Restrictions Per Provider Order: Yes LUE Weight Bearing Per Provider Order: Non weight bearing     Mobility Bed Mobility Overal bed mobility: Needs Assistance Bed Mobility: Supine to Sit     Supine to sit: Min assist     General bed mobility comments: MIN A from dtr, will be sleeping in lift recliner at home    Transfers Overall transfer level: Needs assistance Equipment used: Straight cane Transfers: Sit to/from Stand Sit to Stand: Supervision      Balance Overall balance assessment: Mild deficits observed, not formally tested     ADL either performed or assessed with clinical judgement   ADL Overall ADL's : Needs assistance/impaired   General ADL Comments: Pt requires MOD A for LB ADL, MOD A for UB ADL 2/2 LUE impairments. Dtr able to assist.    Pertinent Vitals/Pain Pain Assessment Pain Assessment: 0-10 Pain Score: 3  Pain Location: L shoulder Pain Descriptors / Indicators: Aching Pain Intervention(s): Limited activity within patient's tolerance, Monitored during session, Repositioned     Extremity/Trunk Assessment Upper Extremity Assessment Upper Extremity Assessment: Right hand dominant;Generalized weakness   Lower Extremity Assessment Lower Extremity Assessment: Generalized weakness       Communication Communication Communication: No apparent difficulties   Cognition Arousal: Alert Behavior During Therapy: WFL for tasks assessed/performed Cognition: No apparent  impairments   Following commands: Intact       Exercises Other Exercises Other Exercises: Pt/dtr educated in shoulder precautions, shoulder sling/immobilizer mgt, hemi techniques for  ADL, AE/DME, falls prevention, home/routines modifications, compression stocking mgt, and polar care mgt. Handout provided.        Home Living Family/patient expects to be discharged to:: Private residence Living Arrangements: Children Available Help at Discharge: Family;Available 24 hours/day Type of Home: House       Home Layout: Two level;Able to live on main level with bedroom/bathroom     Bathroom Shower/Tub: Producer, television/film/video: Standard     Home Equipment: Cane - single point;Lift chair          Prior Functioning/Environment Prior Level of Function : Driving;Independent/Modified Independent     Mobility Comments: SPC outside only ADLs Comments: Indep; doesn't drive    OT Problem List: Decreased strength;Decreased coordination;Pain;Decreased range of motion;Impaired sensation;Impaired balance (sitting and/or standing);Decreased knowledge of use of DME or AE;Impaired UE functional use   OT Treatment/Interventions:        OT Goals(Current goals can be found in the care plan section)   Acute Rehab OT Goals Patient Stated Goal: get better OT Goal Formulation: All assessment and education complete, DC therapy   AM-PAC OT "6 Clicks" Daily Activity     Outcome Measure Help from another person eating meals?: A Little Help from another person taking care of personal grooming?: A Little Help from another person toileting, which includes using toliet, bedpan, or urinal?: A Little Help from another person bathing (including washing, rinsing, drying)?: A Lot Help from another person to put on and taking off regular upper body clothing?: A Lot Help from another person to put on and taking off regular lower body clothing?: A Lot 6 Click Score: 15   End of Session Equipment  Utilized During Treatment: Gait belt Nurse Communication: Mobility status  Activity Tolerance: Patient tolerated treatment well Patient left: in bed;with call bell/phone within reach;with nursing/sitter in room;with family/visitor present  OT Visit Diagnosis: Other abnormalities of gait and mobility (R26.89);Pain Pain - Right/Left: Left Pain - part of body: Shoulder                Time: 9604-5409 OT Time Calculation (min): 43 min Charges:  OT General Charges $OT Visit: 1 Visit OT Evaluation $OT Eval Low Complexity: 1 Low OT Treatments $Self Care/Home Management : 23-37 mins  Arman Filter., MPH, MS, OTR/L ascom [phone] 02/26/24, 5:02 PM

## 2024-02-26 NOTE — H&P (Signed)
 History of Present Illness: Andrea Hart is a 88 y.o. female who presents today for her surgical history and physical for upcoming left reverse total shoulder arthroplasty scheduled with Dr. Joice Lofts on 02/26/2024. The patient has been diagnosed in the past with left shoulder osteoarthritic changes in addition to underlying rotator cuff tendinopathy. The patient reports a 10 out of 10 pain score at today's visit, she is taking Tylenol as needed for discomfort. She does continue to have increased discomfort when attempting to reach above her head out to the side and when performing her day-to-day activities. The patient does have a history of asthma, she does use an inhaler as needed. She does have a history of stroke in the past in addition to cardiac stent placement. The patient does take Plavix, she has been instructed by her cardiologist to stop the Plavix at this time. She denies any personal history of diabetes. No history of DVT.  Past Medical History: Abdominal hernia  Arthritis  Asthma (HHS-HCC)  History of stroke  Hypertension  Hypothyroidism   Past Surgical History: COLONOSCOPY 04/06/2021 (Tubular adenoma/No repeat due to age/TKT)  Bilateral knee replacement  CAROTID STENT  CATARACT EXTRACTION  HYSTERECTOMY VAGINAL  JOINT REPLACEMENT  OOPHORECTOMY  REPLACEMENT TOTAL HIP W/ RESURFACING IMPLANTS   Past Family History: Stroke Mother  Myocardial Infarction (Heart attack) Father  Myocardial Infarction (Heart attack) Brother   Medications: acetaminophen (TYLENOL) 325 MG tablet Take 325 mg by mouth 2 (two) times daily  acetaminophen (TYLENOL) 500 MG tablet Take 1,000 mg by mouth at bedtime  ADVAIR DISKUS 500-50 mcg/dose diskus inhaler 1 inhalation every 12 (twelve) hours  albuterol 90 mcg/actuation inhaler Inhale 2 inhalations into the lungs every 6 (six) hours as needed  amLODIPine (NORVASC) 10 MG tablet 10 mg once daily  aspirin 81 MG EC tablet Take 81 mg by mouth once daily   carvediloL (COREG) 12.5 MG tablet 12.5 mg 2 (two) times daily with meals  cetirizine (ZYRTEC) 10 MG tablet Take 10 mg by mouth once daily  chlorthalidone 25 MG tablet Take 25 mg by mouth once daily  cholecalciferol (CHOLECALCIFEROL) 1000 unit tablet Take 1,000 Units by mouth once daily  clopidogrel (PLAVIX) 75 mg tablet Take 75 mg by mouth once daily  iron fum,ps-folic-Bcomp,C no.9 125 mg iron- 1 mg Cap 125 mg once daily  levothyroxine (SYNTHROID) 100 MCG tablet Take 100 mcg by mouth once daily  levothyroxine (SYNTHROID, LEVOTHROID) 88 MCG tablet Take 88 mcg by mouth once daily  montelukast (SINGULAIR) 10 mg tablet Take 10 mg by mouth nightly  multivitamin capsule Take 1 capsule by mouth once daily  omeprazole (PRILOSEC) 40 MG DR capsule Take 40 mg by mouth once daily as needed  oxyBUTYnin (DITROPAN-XL) 5 MG XL tablet Take 5 mg by mouth daily  rosuvastatin (CRESTOR) 20 MG tablet Take 20 mg by mouth Take 1 pill by mouth once a day.  spironolactone (ALDACTONE) 25 MG tablet Take 12.5 mg by mouth once daily  TURMERIC ORAL Take 1 tablet by mouth once daily  valsartan (DIOVAN) 320 MG tablet Take 320 mg by mouth once daily  zinc 50 mg Tab Take 1 tablet by mouth once daily   Allergies: No Known Allergies   Review of Systems:  A comprehensive 14 point ROS was performed, reviewed by me today, and the pertinent orthopaedic findings are documented in the HPI.  Physical Exam: BP 136/70  Ht 157.5 cm (5\' 2" )  Wt 61.6 kg (135 lb 12.8 oz)  BMI 24.84 kg/m  General/Constitutional: The patient appears to be well-nourished, well-developed, and in no acute distress. Neuro/Psych: Normal mood and affect, oriented to person, place and time. Eyes: Non-icteric. Pupils are equal, round, and reactive to light, and exhibit synchronous movement. ENT: Unremarkable. Lymphatic: No palpable adenopathy. Respiratory: Lungs clear to auscultation, Normal chest excursion, No wheezes, and Non-labored  breathing Cardiovascular: Regular rate and rhythm. No murmurs. and No edema, swelling or tenderness, except as noted in detailed exam. Integumentary: No impressive skin lesions present, except as noted in detailed exam. Musculoskeletal: Unremarkable, except as noted in detailed exam.  Left shoulder exam: SKIN: normal SWELLING: none WARMTH: none LYMPH NODES: no adenopathy palpable CREPITUS: Moderate glenohumeral crepitance TENDERNESS: Moderately tender along the lateral and posterolateral shoulder ROM (active):  Forward flexion: 30 degrees Abduction: 20 degrees Internal rotation: Left PSIS ROM (passive):  Forward flexion: 40 degrees Abduction: 30 degrees ER/IR at 90 abd: Not evaluated  She experiences moderate to severe pain and crepitance with all motions.  STRENGTH: Forward flexion: 3/5 Abduction: 3/5 External rotation: 3+-4/5 Internal rotation: 4/5 Pain with RC testing: Moderate pain and crepitance with resisted strength testing.  STABILITY: Normal  SPECIAL TESTS: Juanetta Gosling' test: not evaluated Speed's test: Not evaluated Capsulitis - pain w/ passive ER: no Crossed arm test: Not evaluated Crank: Not evaluated Anterior apprehension: Negative Posterior apprehension: Not evaluated  She is grossly neurovascularly intact to the left upper extremity.  Imaging: CT OF THE UPPER LEFT EXTREMITY WITHOUT CONTRAST:  1. Severe osteoarthritis of the left glenohumeral joint.  2. Aortic Atherosclerosis (ICD10-I70.0).   Impression: 1. Primary osteoarthritis of left shoulder. 2. Rotator cuff tendinitis, left.  Plan:  1. Treatment options were discussed today with the patient. 2. The patient is scheduled for a left reverse total shoulder arthroplasty with Dr. Joice Lofts on 02/26/2024. 3. The patient was instructed on the risk and benefits of surgical intervention and wishes to proceed at this time. 4. This document will serve as a surgical history and physical. She has been instructed to  stop her Plavix at this time. 5. The patient will follow-up per standard postop protocol. They can call the clinic they have any questions, new symptoms develop or symptoms worsen.  The procedure was discussed with the patient, as were the potential risks (including bleeding, infection, nerve and/or blood vessel injury, persistent or recurrent pain, failure of the hardware, stiffness, axillary nerve injury, stress fracture, need for further surgery, blood clots, strokes, heart attacks and/or arhythmias, pneumonia, etc.) and benefits. The patient states her understanding and wishes to proceed.    H&P reviewed and patient re-examined. No changes.

## 2024-02-26 NOTE — Anesthesia Preprocedure Evaluation (Addendum)
 Anesthesia Evaluation  Patient identified by MRN, date of birth, ID band Patient awake    Reviewed: Allergy & Precautions, NPO status , Patient's Chart, lab work & pertinent test results  Airway Mallampati: III  TM Distance: >3 FB Neck ROM: full    Dental no notable dental hx. (+) Dental Advidsory Given   Pulmonary asthma    Pulmonary exam normal        Cardiovascular hypertension, + CAD, +CHF and + DOE  Normal cardiovascular exam+ dysrhythmias  Rhythm:Regular Rate:Normal  ECHO 07/23:  1. Left ventricular ejection fraction, by estimation, is 60 to 65%. The  left ventricle has normal function. The left ventricle has no regional  wall motion abnormalities. There is moderate left ventricular hypertrophy.  Left ventricular diastolic  parameters are consistent with Grade I diastolic dysfunction (impaired  relaxation).   2. Right ventricular systolic function is normal. The right ventricular  size is normal.   3. Left atrial size was mildly dilated.   4. The mitral valve is normal in structure. Mild mitral valve  regurgitation. No evidence of mitral stenosis.   5. The aortic valve is normal in structure. Aortic valve regurgitation is  not visualized. Aortic valve sclerosis/calcification is present, without  any evidence of aortic stenosis.   6. The inferior vena cava is normal in size with greater than 50%  respiratory variability, suggesting right atrial pressure of 3 mmHg.     Neuro/Psych  PSYCHIATRIC DISORDERS Anxiety     CVA, No Residual Symptoms    GI/Hepatic Neg liver ROS,GERD  Medicated and Controlled,,  Endo/Other  Hypothyroidism    Renal/GU   negative genitourinary   Musculoskeletal  (+) Arthritis , Osteoarthritis,    Abdominal Normal abdominal exam  (+)   Peds  Hematology negative hematology ROS (+) Lab Results      Component                Value               Date                      WBC                       7.4                 04/18/2023                HGB                      11.6 (L)            04/18/2023                HCT                      35.5 (L)            04/18/2023                MCV                      91.3                04/18/2023                PLT  220                 04/18/2023             Lab Results      Component                Value               Date                      NA                       140                 07/17/2022                K                        4.3                 07/17/2022                CO2                      16 (L)              07/17/2022                GLUCOSE                  119 (H)             07/17/2022                BUN                      22                  07/17/2022                CREATININE               1.30 (H)            04/12/2023                CALCIUM                  9.9                 07/17/2022                EGFR                     40 (L)              07/17/2022                GFRNONAA                 42 (L)              06/09/2022              Anesthesia Other Findings Note per Andrea Hart:  Andrea Hart is a 88 y.o. female who is submitted for pre-surgical anesthesia review and clearance prior to her undergoing the above procedure. Patient has never been a smoker in the past. Pertinent PMH includes: CAD, CVA x  2, cerebral microvascular disease, LBBB, PVCs, aortic atherosclerosis, carotid artery disease (s/p LEFT TCAR), HTN, HLD, hypothyroidism, CKD-III, asthma, GERD (on NaHCO3 tablets PRN), hiatal hernia (s/p Nissen fundoplication), OA, LEFT rotator cuff tendinitis, cervical DDD.   Patient is followed by cardiology Andrea Corin, MD). She was last seen in the cardiology clinic on 02/19/2024; notes reviewed. At the time of her clinic visit, patient doing well overall from a cardiovascular perspective.  Patient complained of progressive dependent edema in her lower extremities that is improved with  extremity elevation.  Patient denied any chest pain, shortness of breath, PND, orthopnea, palpitations, weakness, fatigue, vertiginous symptoms, or presyncope/syncope. Patient with a past medical history significant for cardiovascular diagnoses. Documented physical exam was grossly benign, providing no evidence of acute exacerbation and/or decompensation of the patient's known cardiovascular conditions.  Of note, complete records regarding patient's cardiovascular history unavailable for review at time of consult.  Patient has received care in the state of [place].  Information gathered from available records, verbal patient report, and from notes provided by her local care team.    Coronary CTA was performed on 02/12/2015 that demonstrated an Agatston coronary artery calcium score of 182. This placed patient in the 54 percentile for age, sex, and race matched controls. Calcium depositions causing a subtotal occlusion in the mid RCA.  There was also moderate calcifications noted in the LAD.  Study demonstrates normal coronary origin with RIGHT dominance.    Patient underwent diagnostic LEFT heart catheterization while living in [facility] [place] for stable angina on 02/12/2015.  Study revealed a subtotal occlusion of the mid RCA.  She underwent PCI and placement of a Resolute Integrity DES x 1 (unknown size) to the mid RCA yielding excellent angiographic result and TIMI-3 flow.    Patient underwent repeat diagnostic LEFT heart catheterization on 12/24/2021 revealing multivessel CAD; 10% mid RCA, 95% proximal-mid RCA, and 20% proximal-mid LAD.  PCI was performed placing a 3.0 x 18 mm Onyx Frontier DES x 1 overlapping the previously placed stent to the proximal-mid RCA.  Procedure yielded excellent angiographic result and TIMI-3 flow.    Per report, patient has suffered 2 separate CVAs in the past.  Most recent MRI imaging of the brain performed on 02/10/2022 revealed evidence of a small chronic LEFT  thalamic infarct.  Patient with stable chronic microvascular ischemic changes.  Following her neurovascular events, patient has no significant residual deficits.    Patient with known BILATERAL carotid artery disease (L >R).  Patient ultimately underwent a LEFT transcarotid artery revascularization (TCAR) procedure on 04/18/2023.    Myocardial perfusion imaging study was performed on 06/28/2023 revealing a normal left ventricular systolic function with a hyperdynamic LVEF of 70%.  Left bundle branch block was noted during infusion that returned to narrow complex QRS in recovery.  There was no evidence of stress-induced myocardial ischemia.  CT attenuation correction images demonstrated three-vessel coronary artery calcifications and moderate aortic atherosclerosis.  Overall, study was determined to be low risk.    Diagnostic RIGHT heart catheterization was performed on 10/15/2023 revealing multivessel CAD; 25% proximal-mid LAD, 30-40% mid LCx, and 30-35% in-stent restenosis of the previously placed stents to the mid RCA.  Hemodynamics: mean RA = 6 mmHg, mean PA = 22 mmHg, mean PCWP = 10 mmHg, CO = 4.72 L/min, and CI 2.93 L/min/m.  Patient appeared to be well compensated from a cardiac perspective with no high-grade obstructive CAD.  Given her essentially normal RIGHT heart catheterization, continued aggressive medical therapy was recommended.  Most recent TTE performed on 12/13/2023 revealed a normal left ventricular systolic function with an EF of 55-60%. There septal motion abnormality consistent with known LBBB. Left ventricular diastolic Doppler parameters consistent with abnormal relaxation (G1DD). GLS -12.3%. Right ventricular size and function normal. There was moderate mitral annular calcification.  Aortic valve was sclerotic/calcified.  There was mild mitral and tricuspid valve regurgitation. All transvalvular gradients were noted to be normal providing no evidence suggestive of valvular  stenosis. Aorta normal in size with no evidence of ectasia or aneurysmal dilatation.   Given patient's CVA and history of PAD, patient remains on daily DAPT therapy using ASA + clopidogrel.  Patient is reported be compliant with therapy with no evidence or reports of GI bleeding. Blood pressure reasonably controlled at 130/70 mmHg on currently prescribed CCB (amlodipine), diuretic (Parona lactone), and ARB (losartan) therapies.  Patient is on rosuvastatin for her HLD diagnosis and ASCVD prevention. Patient is not diabetic. She does not have an OSAH diagnosis. Patient is able to complete all of her  ADL/IADLs without cardiovascular limitation.  Per the DASI, patient is able to achieve at least 4 METS of physical activity without experiencing any significant degree of angina/anginal equivalent symptoms. No changes were made to her medication regimen during her visit with cardiology.  Patient scheduled to follow-up with outpatient cardiology in 3 months or sooner if needed.   Andrea Hart is scheduled for an elective ARTHROPLASTY, SHOULDER, TOTAL, REVERSE (Left: Shoulder) on 02/26/2024 with Dr. Leron Croak, MD.  Given patient's past medical history significant for cardiovascular diagnoses, presurgical cardiac clearance was sought by the PAT team.  Per cardiology, "per the [facility] activity status index, she can achieve greater than 4 METS without cardiac limitation. Per revised cardiac risk index she is class I risk with an estimated rate of 6% for adverse cardiac event in the periprocedural timeframe. She is without symptoms of angina or cardiac decompensation. Recent cardiac catheter showed patent stents in the RCA with mild nonobstructive CAD involving the left coronary tree. She may proceed with noncardiac surgery at an overall class I risk without further cardiac testing".   In review of the patient's chart, it is noted that she is on daily oral antithrombotic therapy. She has been instructed on recommendations  for holding her clopidogrel for 5 days prior to her procedure with plans to restart as soon as postoperative bleeding risk felt to be minimized by his attending surgeon. The patient has been instructed that her last dose of clopidogrel should be on 02/20/2024. Given that patient's past medical history is significant for cardiovascular diagnoses, including but not limited to CAD, orthopedics has cleared patient to continue her daily low dose ASA throughout her perioperative course. She will be asked to hold her normal dose on the day of her procedure only. Patient has been updated on these directives from her specialty care providers by the PAT team.   Patient denies previous perioperative complications with anesthesia in the past. In review her EMR, it is noted that patient underwent a general anesthetic course at [facility] (ASA III) in 04/2023 without documented complications.      Past Medical History: No date: Abdominal hernia No date: Aortic atherosclerosis (HCC) No date: Arthritis No date: Asthma No date: Carotid arterial disease (HCC)     Comment:  a.) 11/2020 Carotid U/S: Less than 50% RICA, 50-69%               LICA; b.) s/p LEFT TCAR 04/18/2023 No date:  Cerebral microvascular disease No date: CKD (chronic kidney disease), stage III (HCC) 02/2015: Coronary artery disease     Comment:  a.) cCTA 02/2015: Ca2+ 182 (68th %'ile); b.) PCI               02/12/2015 in [place] done for stable               angina --> subtotal occlusion mRCA (Resolute Integrity               DES x 1); c.) LHC/PCI 05/24/2022: 10% mRCA, 95% p-mRCA               (3.0 x 18 mm Onyx Frontier DES overlapping previous               stent), 20% p-mLAD; d.) RHC 10/18/2023: 25% p-mLAD,               30-40% mLCx, 30-35% ISR mRCA - med mgmt No date: DDD (degenerative disc disease), cervical No date: GERD (gastroesophageal reflux disease) 02/02/2021: GI bleeding No date: History of blood  transfusion No date: History of hiatal hernia (s/p Nissen fundoplication repair) No date: Hyperlipidemia No date: Hypertension No date: Hypothyroidism No date: LBBB (left bundle branch block) No date: Long term current use of clopidogrel No date: Long-term use of aspirin therapy No date: Pneumonia No date: PVC's (premature ventricular contractions)     Comment:  a. 01/2020 Zio: RSR, 70, frequent PVCs with 10.5% burden. 2017: Stroke [facility])     Comment:  a. 2017 - ILR did not show afib. No date: Stroke [facility])     Comment:  a.) date unknown; has had CVAs x 2  Past Surgical History: No date: ABDOMINAL HYSTERECTOMY No date: APPENDECTOMY No date: CATARACT EXTRACTION; Bilateral No date: CHOLECYSTECTOMY 04/06/2021: COLONOSCOPY WITH PROPOFOL; N/A     Comment:  Procedure: COLONOSCOPY WITH PROPOFOL;  Surgeon: [place],               Boykin Nearing, MD;  Location: [facility] ENDOSCOPY;  Service:               Gastroenterology;  Laterality: N/A; 02/12/2015: CORONARY ANGIOPLASTY WITH STENT PLACEMENT; Left     Comment:  Procedure: CORONARY ANGIOPLASTY WITH STENT PLACEMENT;               Location: [place], [place]. 05/24/2022: CORONARY STENT INTERVENTION; N/A     Comment:  Procedure: CORONARY STENT INTERVENTION;  Surgeon: Iran Ouch, MD;  Location: [facility] INVASIVE CV LAB;                Service: Cardiovascular;  Laterality: N/A; No date: EYE SURGERY No date: HAND SURGERY; Right 05/24/2022: LEFT HEART CATH AND CORONARY ANGIOGRAPHY; Left     Comment:  Procedure: LEFT HEART CATH AND CORONARY ANGIOGRAPHY;                Surgeon: Iran Ouch, MD;  Location: [facility] INVASIVE               CV LAB;  Service: Cardiovascular;  Laterality: Left; No date: LOOP RECORDER IMPLANT 2016: NISSEN FUNDOPLICATION 2017: REPLACEMENT TOTAL KNEE; Bilateral 10/15/2023: RIGHT HEART CATH AND CORONARY ANGIOGRAPHY; N/A     Comment:  Procedure: RIGHT HEART CATH AND CORONARY ANGIOGRAPHY;                Surgeon:  Tonny Bollman, MD;  Location: [facility] INVASIVE CV  LAB;  Service: Cardiovascular;  Laterality: N/A; No date: TOTAL HIP ARTHROPLASTY; Right No date: TOTAL VAGINAL HYSTERECTOMY 04/18/2023: TRANSCAROTID ARTERY REVASCULARIZATION; Left     Comment:  Procedure: LEFT Transcarotid Artery Revascularization;                Surgeon: Nada Libman, MD;  Location: [facility] OR;                Service: Vascular;  Laterality: Left; 04/18/2023: ULTRASOUND GUIDANCE FOR VASCULAR ACCESS     Comment:  Procedure: ULTRASOUND GUIDANCE FOR VASCULAR ACCESS;                Surgeon: Nada Libman, MD;  Location: [facility] OR;                Service: Vascular;;     Reproductive/Obstetrics negative OB ROS                             Anesthesia Physical Anesthesia Plan  ASA: 3  Anesthesia Plan: General ETT   Post-op Pain Management: Regional block*   Induction: Intravenous  PONV Risk Score and Plan: 3 and Ondansetron, Dexamethasone and Midazolam  Airway Management Planned: Oral ETT  Additional Equipment:   Intra-op Plan:   Post-operative Plan: Extubation in OR  Informed Consent: I have reviewed the patients History and Physical, chart, labs and discussed the procedure including the risks, benefits and alternatives for the proposed anesthesia with the patient or authorized representative who has indicated his/her understanding and acceptance.     Dental Advisory Given  Plan Discussed with: Anesthesiologist, CRNA and Surgeon  Anesthesia Plan Comments: (Patient consented for risks of anesthesia including but not limited to:  - adverse reactions to medications - damage to eyes, teeth, lips or other oral mucosa - nerve damage due to positioning  - sore throat or hoarseness - Damage to heart, brain, nerves, lungs, other parts of body or loss of life  Patient voiced understanding and assent.)       Anesthesia Quick Evaluation

## 2024-02-26 NOTE — Transfer of Care (Signed)
 Immediate Anesthesia Transfer of Care Note  Patient: Andrea Hart  Procedure(s) Performed: ARTHROPLASTY, SHOULDER, TOTAL, REVERSE (Left: Shoulder)  Patient Location: PACU  Anesthesia Type:General and Regional  Level of Consciousness: drowsy and patient cooperative  Airway & Oxygen Therapy: Patient Spontanous Breathing and Patient connected to nasal cannula oxygen  Post-op Assessment: Report given to RN and Post -op Vital signs reviewed and stable  Post vital signs: Reviewed and stable  Last Vitals:  Vitals Value Taken Time  BP 148/65 02/26/24 1419  Temp 36.1 C 02/26/24 1419  Pulse 86 02/26/24 1419  Resp 19 02/26/24 1419  SpO2 96 % 02/26/24 1419  Vitals shown include unfiled device data.  Last Pain:  Vitals:   02/26/24 1419  TempSrc: Tympanic  PainSc: 0-No pain         Complications: No notable events documented.

## 2024-02-27 ENCOUNTER — Encounter: Payer: Self-pay | Admitting: Surgery

## 2024-02-27 NOTE — Anesthesia Postprocedure Evaluation (Signed)
 Anesthesia Post Note  Patient: Andrea Hart  Procedure(s) Performed: ARTHROPLASTY, SHOULDER, TOTAL, REVERSE (Left: Shoulder)  Patient location during evaluation: PACU Anesthesia Type: General Level of consciousness: awake and alert Pain management: pain level controlled Vital Signs Assessment: post-procedure vital signs reviewed and stable Respiratory status: spontaneous breathing, nonlabored ventilation, respiratory function stable and patient connected to nasal cannula oxygen Cardiovascular status: blood pressure returned to baseline and stable Postop Assessment: no apparent nausea or vomiting Anesthetic complications: no   No notable events documented.   Last Vitals:  Vitals:   02/26/24 1520 02/26/24 1721  BP: (!) 149/64 (!) 161/78  Pulse: 74 81  Resp: 16 16  Temp: 37.3 C 36.7 C  SpO2: 94% 94%    Last Pain:  Vitals:   02/26/24 1721  TempSrc:   PainSc: (P) 0-No pain                 Stephanie Coup

## 2024-03-23 ENCOUNTER — Other Ambulatory Visit: Payer: Self-pay | Admitting: Physician Assistant

## 2024-04-06 ENCOUNTER — Other Ambulatory Visit: Payer: Self-pay | Admitting: Physician Assistant

## 2024-04-06 DIAGNOSIS — E785 Hyperlipidemia, unspecified: Secondary | ICD-10-CM

## 2024-04-06 DIAGNOSIS — I10 Essential (primary) hypertension: Secondary | ICD-10-CM

## 2024-04-10 ENCOUNTER — Other Ambulatory Visit: Payer: Self-pay | Admitting: Family Medicine

## 2024-05-23 ENCOUNTER — Ambulatory Visit: Admitting: Physician Assistant

## 2024-05-31 ENCOUNTER — Other Ambulatory Visit: Payer: Self-pay | Admitting: Family Medicine

## 2024-06-02 NOTE — Telephone Encounter (Signed)
 Requested medications are due for refill today.  unsure  Requested medications are on the active medications list.  yes  Last refill. 02/26/2024  Future visit scheduled.   yes  Notes to clinic.  Rx signed by Dr. Edie    Requested Prescriptions  Pending Prescriptions Disp Refills   valsartan  (DIOVAN ) 320 MG tablet [Pharmacy Med Name: VALSARTAN  320 MG TABLET] 90 tablet 1    Sig: TAKE 1 TABLET BY MOUTH EVERY DAY     Cardiovascular:  Angiotensin Receptor Blockers Failed - 06/02/2024  5:32 PM      Failed - Cr in normal range and within 180 days    Creatinine, Ser  Date Value Ref Range Status  02/19/2024 1.10 (H) 0.44 - 1.00 mg/dL Final         Failed - Last BP in normal range    BP Readings from Last 1 Encounters:  02/26/24 (!) 161/78         Failed - Valid encounter within last 6 months    Recent Outpatient Visits   None     Future Appointments             In 1 week Dunn, Bernardino HERO, PA-C [facility] [place]   In 2 weeks Myrla, Jon HERO, MD [facility]            Passed - K in normal range and within 180 days    Potassium  Date Value Ref Range Status  02/19/2024 3.5 3.5 - 5.1 mmol/L Final         Passed - Patient is not pregnant

## 2024-06-03 DIAGNOSIS — C439 Malignant melanoma of skin, unspecified: Secondary | ICD-10-CM

## 2024-06-03 HISTORY — DX: Malignant melanoma of skin, unspecified: C43.9

## 2024-06-06 ENCOUNTER — Other Ambulatory Visit: Payer: Self-pay | Admitting: Family Medicine

## 2024-06-12 ENCOUNTER — Ambulatory Visit: Admitting: Physician Assistant

## 2024-06-16 ENCOUNTER — Ambulatory Visit: Attending: Physician Assistant | Admitting: Physician Assistant

## 2024-06-16 ENCOUNTER — Ambulatory Visit: Payer: Self-pay | Admitting: Family Medicine

## 2024-06-16 ENCOUNTER — Encounter: Payer: Self-pay | Admitting: Family Medicine

## 2024-06-16 ENCOUNTER — Encounter: Payer: Self-pay | Admitting: Physician Assistant

## 2024-06-16 VITALS — BP 130/59 | HR 70 | Ht 60.0 in | Wt 129.4 lb

## 2024-06-16 VITALS — BP 127/64 | HR 64 | Resp 14 | Ht 60.0 in | Wt 129.6 lb

## 2024-06-16 DIAGNOSIS — I10 Essential (primary) hypertension: Secondary | ICD-10-CM

## 2024-06-16 DIAGNOSIS — Z8673 Personal history of transient ischemic attack (TIA), and cerebral infarction without residual deficits: Secondary | ICD-10-CM

## 2024-06-16 DIAGNOSIS — Z0001 Encounter for general adult medical examination with abnormal findings: Secondary | ICD-10-CM | POA: Diagnosis not present

## 2024-06-16 DIAGNOSIS — E039 Hypothyroidism, unspecified: Secondary | ICD-10-CM

## 2024-06-16 DIAGNOSIS — I6523 Occlusion and stenosis of bilateral carotid arteries: Secondary | ICD-10-CM

## 2024-06-16 DIAGNOSIS — E785 Hyperlipidemia, unspecified: Secondary | ICD-10-CM | POA: Diagnosis not present

## 2024-06-16 DIAGNOSIS — N3281 Overactive bladder: Secondary | ICD-10-CM

## 2024-06-16 DIAGNOSIS — Z8719 Personal history of other diseases of the digestive system: Secondary | ICD-10-CM

## 2024-06-16 DIAGNOSIS — E782 Mixed hyperlipidemia: Secondary | ICD-10-CM

## 2024-06-16 DIAGNOSIS — I25118 Atherosclerotic heart disease of native coronary artery with other forms of angina pectoris: Secondary | ICD-10-CM | POA: Diagnosis not present

## 2024-06-16 DIAGNOSIS — Z780 Asymptomatic menopausal state: Secondary | ICD-10-CM

## 2024-06-16 DIAGNOSIS — Z0000 Encounter for general adult medical examination without abnormal findings: Secondary | ICD-10-CM

## 2024-06-16 DIAGNOSIS — I493 Ventricular premature depolarization: Secondary | ICD-10-CM

## 2024-06-16 DIAGNOSIS — N1831 Chronic kidney disease, stage 3a: Secondary | ICD-10-CM

## 2024-06-16 DIAGNOSIS — R7303 Prediabetes: Secondary | ICD-10-CM

## 2024-06-16 DIAGNOSIS — M62838 Other muscle spasm: Secondary | ICD-10-CM

## 2024-06-16 DIAGNOSIS — J4520 Mild intermittent asthma, uncomplicated: Secondary | ICD-10-CM

## 2024-06-16 DIAGNOSIS — N183 Chronic kidney disease, stage 3 unspecified: Secondary | ICD-10-CM

## 2024-06-16 DIAGNOSIS — K219 Gastro-esophageal reflux disease without esophagitis: Secondary | ICD-10-CM

## 2024-06-16 DIAGNOSIS — R011 Cardiac murmur, unspecified: Secondary | ICD-10-CM

## 2024-06-16 DIAGNOSIS — I447 Left bundle-branch block, unspecified: Secondary | ICD-10-CM

## 2024-06-16 DIAGNOSIS — M19012 Primary osteoarthritis, left shoulder: Secondary | ICD-10-CM

## 2024-06-16 MED ORDER — ALBUTEROL SULFATE HFA 108 (90 BASE) MCG/ACT IN AERS
2.0000 | INHALATION_SPRAY | RESPIRATORY_TRACT | 11 refills | Status: AC | PRN
Start: 2024-06-16 — End: ?

## 2024-06-16 MED ORDER — OMEPRAZOLE 40 MG PO CPDR: 40.0000 mg | DELAYED_RELEASE_CAPSULE | Freq: Two times a day (BID) | ORAL | 1 refills | Status: DC

## 2024-06-16 MED ORDER — METHOCARBAMOL 500 MG PO TABS: 500.0000 mg | ORAL_TABLET | Freq: Every evening | ORAL | 1 refills | Status: DC | PRN

## 2024-06-16 MED ORDER — OXYBUTYNIN CHLORIDE ER 5 MG PO TB24: 5.0000 mg | ORAL_TABLET | Freq: Every day | ORAL | 1 refills | Status: DC

## 2024-06-16 MED ORDER — LEVOTHYROXINE SODIUM 100 MCG PO TABS: 100.0000 ug | ORAL_TABLET | Freq: Every day | ORAL | 1 refills | Status: DC

## 2024-06-16 MED ORDER — AMLODIPINE BESYLATE 10 MG PO TABS: 10.0000 mg | ORAL_TABLET | Freq: Every day | ORAL | 1 refills | Status: DC

## 2024-06-16 MED ORDER — OXYBUTYNIN CHLORIDE ER 10 MG PO TB24: 10.0000 mg | ORAL_TABLET | Freq: Every day | ORAL | 1 refills | Status: DC

## 2024-06-16 MED ORDER — MONTELUKAST SODIUM 10 MG PO TABS: 10.0000 mg | ORAL_TABLET | Freq: Every day | ORAL | 1 refills | Status: DC

## 2024-06-16 MED ORDER — ROSUVASTATIN CALCIUM 20 MG PO TABS: 20.0000 mg | ORAL_TABLET | Freq: Every day | ORAL | 3 refills | Status: DC

## 2024-06-16 MED ORDER — VALSARTAN 320 MG PO TABS: 320.0000 mg | ORAL_TABLET | Freq: Every day | ORAL | 1 refills | Status: AC

## 2024-06-16 NOTE — Progress Notes (Unsigned)
 Annual Wellness Visit     Patient: Andrea Hart, Female    DOB: 03/04/1936, 88 y.o.   MRN: [id number] Visit Date: 06/16/2024  Today's Provider: Jon Eva, MD   Chief Complaint  Patient presents with  . Annual Exam    Sleeping pattern: wake up to urinate, wears pads and each pad was soaked. Takes thyroid  medication at 6 am and stayed up since. Exercise: daily, walking, going up stairs. No concerns   Subjective    Andrea Hart is a 88 y.o. female who presents today for her Annual Wellness Visit.  Discussed the use of AI scribe software for clinical note transcription with the patient, who gave verbal consent to proceed.  History of Present Illness            {VISON DENTAL STD PSA (Optional):27386}  {History (Optional):23778}   Medications: Outpatient Medications Prior to Visit  Medication Sig  . acetaminophen  (TYLENOL ) 500 MG tablet Take 1,000 mg by mouth at bedtime.  . albuterol  (VENTOLIN  HFA) 108 (90 Base) MCG/ACT inhaler Inhale 2 puffs into the lungs every 4 (four) hours as needed for wheezing or shortness of breath.  . amLODipine  (NORVASC ) 10 MG tablet Take 1 tablet (10 mg total) by mouth daily.  . aspirin  EC 81 MG tablet Take 81 mg by mouth every evening.  . budesonide -formoterol  (SYMBICORT ) 160-4.5 MCG/ACT inhaler Inhale 2 puffs into the lungs 2 (two) times daily.  . cetirizine  (ZYRTEC ) 10 MG tablet Take 1 tablet (10 mg total) by mouth at bedtime.  . Cholecalciferol (VITAMIN D3) 125 MCG (5000 UT) TABS Take 1,000 Units by mouth daily.  . clobetasol (TEMOVATE) 0.05 % external solution Apply 1 Application topically 2 (two) times daily.  . clopidogrel  (PLAVIX ) 75 MG tablet TAKE 1 TABLET BY MOUTH EVERY DAY  . Fluocinolone Acetonide Scalp 0.01 % OIL   . levothyroxine  (SYNTHROID ) 100 MCG tablet TAKE 1 TABLET BY MOUTH EVERY DAY  . methocarbamol  (ROBAXIN ) 500 MG tablet Take 1 tablet (500 mg total) by mouth at bedtime as needed for muscle spasms.  .  montelukast  (SINGULAIR ) 10 MG tablet Take 1 tablet (10 mg total) by mouth at bedtime.  . Multiple Vitamin (MULTIVITAMIN WITH MINERALS) TABS tablet Take 1 tablet by mouth daily.  . omeprazole  (PRILOSEC) 40 MG capsule Take 1 capsule (40 mg total) by mouth 2 (two) times daily.  . oxybutynin  (DITROPAN -XL) 5 MG 24 hr tablet TAKE 1 TABLET BY MOUTH EVERYDAY AT BEDTIME  . rosuvastatin  (CRESTOR ) 20 MG tablet Take 1 tablet (20 mg total) by mouth daily.  . sodium bicarbonate 650 MG tablet Take 1,300 mg by mouth 2 (two) times daily.  . spironolactone  (ALDACTONE ) 25 MG tablet TAKE 1 TABLET (25 MG TOTAL) BY MOUTH DAILY.  . triamcinolone  cream (KENALOG ) 0.1 % Apply 1 Application topically 2 (two) times daily as needed (psoriasis (elbows)).  . TURMERIC PO Take 1 capsule by mouth daily with lunch. Qunol Joint Comfort With Turmeric Capsules  . valsartan  (DIOVAN ) 320 MG tablet TAKE 1 TABLET BY MOUTH EVERY DAY  . ZINC GLUCONATE PO Take 5 mg by mouth daily.  . chlorhexidine  (HIBICLENS ) 4 % external liquid Apply 15 mLs (1 Application total) topically as directed for 30 doses. Use as directed daily for 5 days every other week for 6 weeks.  . FeFum-FePoly-FA-B Cmp-C-Biot (FOLIVANE-PLUS) CAPS Take 1 capsule by mouth daily.  SABRA ketoconazole (NIZORAL) 2 % shampoo Apply 1 Application topically every Saturday.  . oxyCODONE  (ROXICODONE ) 5 MG immediate  release tablet Take 0.5-1 tablets (2.5-5 mg total) by mouth every 4 (four) hours as needed for moderate pain (pain score 4-6) or severe pain (pain score 7-10).   Facility-Administered Medications Prior to Visit  Medication Dose Route Frequency Provider  . albuterol  (PROVENTIL ) (2.5 MG/3ML) 0.083% nebulizer solution 2.5 mg  2.5 mg Nebulization Once Dgayli, Khabib, MD    No Known Allergies  Patient Care Team: Myrla Jon HERO, MD as PCP - General (Family Medicine) Darron Deatrice LABOR, MD as PCP - Cardiology (Cardiology) Maree Jannett POUR, MD as Consulting Physician  (Neurology) Dennise Capri, MD (Nephrology) Blair Mt, MD as Referring Physician (Otolaryngology) Claudene Morene KATHEE DEVONNA as Physician Assistant (Orthopedic Surgery)  Review of Systems  {Insert previous labs (optional):23779} {See past labs  Heme  Chem  Endocrine  Serology  Results Review (optional):1}    Objective    Vitals: BP 127/64 (BP Location: Left Arm, Patient Position: Sitting, Cuff Size: Normal)   Pulse 64   Resp 14   Ht 5' (1.524 m)   Wt 129 lb 9.6 oz (58.8 kg)   SpO2 98%   BMI 25.31 kg/m  {Insert last BP/Wt (optional):23777}{See vitals history (optional):1}    Physical Exam Vitals reviewed.  Constitutional:      General: She is not in acute distress.    Appearance: Normal appearance. She is well-developed. She is not diaphoretic.  HENT:     Head: Normocephalic and atraumatic.     Right Ear: Tympanic membrane, ear canal and external ear normal.     Left Ear: Tympanic membrane, ear canal and external ear normal.     Nose: Nose normal.     Mouth/Throat:     Mouth: Mucous membranes are moist.     Pharynx: Oropharynx is clear. No oropharyngeal exudate.  Eyes:     General: No scleral icterus.    Conjunctiva/sclera: Conjunctivae normal.     Pupils: Pupils are equal, round, and reactive to light.  Neck:     Thyroid : No thyromegaly.  Cardiovascular:     Rate and Rhythm: Normal rate and regular rhythm.     Heart sounds: Murmur heard.  Pulmonary:     Effort: Pulmonary effort is normal. No respiratory distress.     Breath sounds: Normal breath sounds. No wheezing or rales.  Abdominal:     General: There is no distension.     Palpations: Abdomen is soft.     Tenderness: There is no abdominal tenderness.  Musculoskeletal:        General: No deformity.     Cervical back: Neck supple.     Right lower leg: No edema.     Left lower leg: No edema.  Lymphadenopathy:     Cervical: No cervical adenopathy.  Skin:    General: Skin is warm and dry.      Findings: No rash.  Neurological:     Mental Status: She is alert and oriented to person, place, and time. Mental status is at baseline.     Gait: Gait normal.  Psychiatric:        Mood and Affect: Mood normal.        Behavior: Behavior normal.        Thought Content: Thought content normal.     Most recent functional status assessment:    02/19/2024   10:32 AM  In your present state of health, do you have any difficulty performing the following activities:  Doing errands, shopping? 0   Most recent fall risk assessment:  06/14/2023    2:08 PM  Fall Risk   Falls in the past year? 0  Number falls in past yr: 0  Injury with Fall? 0  Risk for fall due to : No Fall Risks  Follow up Falls evaluation completed    Most recent depression screenings:    06/16/2024   10:26 AM 06/14/2023    2:08 PM  PHQ 2/9 Scores  PHQ - 2 Score 0 0  PHQ- 9 Score 0    Most recent cognitive screening:     No data to display         Most recent Audit-C alcohol use screening    06/16/2024   10:25 AM  Alcohol Use Disorder Test (AUDIT)  1. How often do you have a drink containing alcohol? 0  2. How many drinks containing alcohol do you have on a typical day when you are drinking? 0  3. How often do you have six or more drinks on one occasion? 0  AUDIT-C Score 0   A score of 3 or more in women, and 4 or more in men indicates increased risk for alcohol abuse, EXCEPT if all of the points are from question 1   No results found.  No results found for any visits on 06/16/24.  Assessment & Plan     Annual wellness visit done today including the all of the following: Reviewed patient's Family Medical History Reviewed and updated list of patient's medical providers Assessment of cognitive impairment was done Assessed patient's functional ability Established a written schedule for health screening services Health Risk Assessent Completed and Reviewed  Exercise Activities and Dietary  recommendations  Goals     . DIET - EAT MORE FRUITS AND VEGETABLES        Immunization History  Administered Date(s) Administered  . Fluad Trivalent(High Dose 65+) 09/27/2023  . Influenza, High Dose Seasonal PF 09/02/2020  . Influenza-Unspecified 07/19/2019, 10/25/2021, 09/06/2022  . PFIZER(Purple Top)SARS-COV-2 Vaccination 05/11/2020, 06/03/2020, 08/03/2020, 09/02/2020, 12/02/2020  . Pneumococcal Conjugate-13 02/18/2017  . Pneumococcal Polysaccharide-23 06/13/2018  . Tdap 01/02/2018  . Zoster Recombinant(Shingrix) 02/18/2017    Health Maintenance  Topic Date Due  . DEXA SCAN  Never done  . Zoster Vaccines- Shingrix (2 of 2) 04/15/2017  . COVID-19 Vaccine (6 - 2024-25 season) 08/05/2023  . Medicare Annual Wellness (AWV)  06/13/2024  . INFLUENZA VACCINE  07/04/2024  . DTaP/Tdap/Td (2 - Td or Tdap) 01/03/2028  . Pneumococcal Vaccine: 50+ Years  Completed  . Hepatitis B Vaccines  Aged Out  . HPV VACCINES  Aged Out  . Meningococcal B Vaccine  Aged Out     Discussed health benefits of physical activity, and encouraged her to engage in regular exercise appropriate for her age and condition.    Problem List Items Addressed This Visit   None                No follow-ups on file.     Jon Eva, MD  [facility] [phone] (phone) [phone] (fax)  [facility]

## 2024-06-16 NOTE — Patient Instructions (Signed)
 Medication Instructions:  Your physician recommends that you continue on your current medications as directed. Please refer to the Current Medication list given to you today.   *If you need a refill on your cardiac medications before your next appointment, please call your pharmacy*  Lab Work: None ordered at this time  If you have labs (blood work) drawn today and your tests are completely normal, you will receive your results only by: MyChart Message (if you have MyChart) OR A paper copy in the mail If you have any lab test that is abnormal or we need to change your treatment, we will call you to review the results.  Testing/Procedures: None ordered at this time   Follow-Up: At [facility], you and your health needs are our priority.  As part of our continuing mission to provide you with exceptional heart care, our providers are all part of one team.  This team includes your primary Cardiologist (physician) and Advanced Practice Providers or APPs (Physician Assistants and Nurse Practitioners) who all work together to provide you with the care you need, when you need it.  Your next appointment:   6 month(s)  Provider:   You may see Deatrice Cage, MD or Bernardino Bring, PA-C

## 2024-06-16 NOTE — Progress Notes (Signed)
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 0437   VLDL 23 04/19/2023 0437   LDLCALC 39 04/19/2023 0437   LDLCALC 70 07/17/2022 0903    PHYSICAL EXAM:    VS:  BP (!) 130/59 (BP Location: Left Arm, Patient Position: Sitting, Cuff Size: Normal)   Pulse 70   Ht 5' (1.524 m)  Wt 129 lb 6.4 oz (58.7 kg)   SpO2 98%   BMI 25.27 kg/m   BMI: Body mass index is 25.27 kg/m.  Physical Exam Vitals reviewed.  Constitutional:      Appearance: She is well-developed.  HENT:     Head: Normocephalic and atraumatic.  Eyes:     General:        Right eye: No discharge.        Left eye: No discharge.  Cardiovascular:     Rate and Rhythm: Normal rate and regular rhythm.     Heart sounds: S1 normal and S2 normal. Heart sounds not distant. No midsystolic click and no opening snap. Murmur heard.     Systolic murmur is present with a grade of 2/6 at the upper right sternal border.     No friction rub.  Pulmonary:     Effort: Pulmonary effort is normal. No respiratory distress.     Breath sounds: Normal breath sounds. No decreased breath sounds, wheezing, rhonchi or rales.  Chest:     Chest wall: No tenderness.  Musculoskeletal:      Cervical back: Normal range of motion.  Skin:    General: Skin is warm and dry.     Nails: There is no clubbing.  Neurological:     Mental Status: She is alert and oriented to person, place, and time.  Psychiatric:        Speech: Speech normal.        Behavior: Behavior normal.        Thought Content: Thought content normal.        Judgment: Judgment normal.     Wt Readings from Last 3 Encounters:  06/16/24 129 lb 6.4 oz (58.7 kg)  06/16/24 129 lb 9.6 oz (58.8 kg)  02/19/24 136 lb (61.7 kg)     ASSESSMENT & PLAN:   CAD involving the native coronary arteries with stable angina: She is without symptoms of angina or cardiac decompensation.  She notes a significant improvement in dyspnea and functional status since her last visit.  Continue aggressive risk factor modification and secondary prevention including aspirin , clopidogrel , amlodipine , and rosuvastatin .  No indication for further ischemic testing at this time.  HTN: Blood pressure is well-controlled in the office today.  She remains on amlodipine  10 mg, spironolactone  25 mg, and valsartan  320 mg.  Labs obtained earlier this morning at PCP's office.  HLD: LDL 39 in 04/2023.  Labs obtained earlier this morning at PCP's office.  She remains on rosuvastatin  20 mg.  PVCs: Asymptomatic.  No longer on beta-blocker with noted fatigue and prior bradycardia.  Intermittent LBBB: No symptoms of presyncope or syncope.  Most recent echo showed preserved LV systolic function.  Carotid artery stenosis: Status post left TCAR in 04/2023 with ultrasound in 05/2023 showing patent stent with no obvious evidence of stenosis.  Remains on aspirin , clopidogrel , and rosuvastatin  as outlined above.  Followed by vascular surgery.   History of CVA: No new deficits.  She remains on aspirin , clopidogrel , and rosuvastatin .  History of GI bleed/iron deficiency anemia: No further symptoms of bleeding.  CKD stage III: Followed by nephrology.  Murmur:  Consistent with aortic valve sclerosis with recent echo showing no evidence of stenosis.  Mild mitral regurgitation can be monitored periodically.  No indication for intervention at this time.      Disposition: F/u with Dr. Darron or an APP in 6 months.   Medication Adjustments/Labs and Tests Ordered: Current medicines are reviewed at length  with the patient today.  Concerns regarding medicines are outlined above. Medication changes, Labs and Tests ordered today are summarized above and listed in the Patient Instructions accessible in Encounters.   Signed, Bernardino Bring, PA-C 06/16/2024 5:04 PM     [facility] [place] [address] [place], [place] [address] [phone]

## 2024-06-17 LAB — COMPREHENSIVE METABOLIC PANEL WITH GFR
ALT: 13 IU/L (ref 0–32)
AST: 17 IU/L (ref 0–40)
Albumin: 4 g/dL (ref 3.7–4.7)
Alkaline Phosphatase: 79 IU/L (ref 44–121)
BUN/Creatinine Ratio: 19 (ref 12–28)
BUN: 27 mg/dL (ref 8–27)
Bilirubin Total: 0.2 mg/dL (ref 0.0–1.2)
CO2: 20 mmol/L (ref 20–29)
Calcium: 9.2 mg/dL (ref 8.7–10.3)
Chloride: 106 mmol/L (ref 96–106)
Creatinine, Ser: 1.42 mg/dL — ABNORMAL HIGH (ref 0.57–1.00)
Globulin, Total: 2.2 g/dL (ref 1.5–4.5)
Glucose: 131 mg/dL — ABNORMAL HIGH (ref 70–99)
Potassium: 4.2 mmol/L (ref 3.5–5.2)
Sodium: 140 mmol/L (ref 134–144)
Total Protein: 6.2 g/dL (ref 6.0–8.5)
eGFR: 36 mL/min/1.73 — ABNORMAL LOW (ref >59)

## 2024-06-17 LAB — LIPID PANEL
Chol/HDL Ratio: 2.7 ratio (ref 0.0–4.4)
Cholesterol, Total: 135 mg/dL (ref 100–199)
HDL: 50 mg/dL (ref >39)
LDL Chol Calc (NIH): 61 mg/dL (ref 0–99)
Triglycerides: 141 mg/dL (ref 0–149)
VLDL Cholesterol Cal: 24 mg/dL (ref 5–40)

## 2024-06-17 LAB — TSH: TSH: 0.243 u[IU]/mL — ABNORMAL LOW (ref 0.450–4.500)

## 2024-06-17 LAB — HEMOGLOBIN A1C
Est. average glucose Bld gHb Est-mCnc: 134 mg/dL
Hgb A1c MFr Bld: 6.3 % — ABNORMAL HIGH (ref 4.8–5.6)

## 2024-06-17 NOTE — Assessment & Plan Note (Signed)
 Overactive bladder managed with oxybutynin . Reports waking up three to four times a night, but medication helps her sleep for a couple of hours. No side effects reported. Discussed increasing the dose of oxybutynin  to improve symptoms. If symptoms persist, consider switching to Myrbetriq. - Increase oxybutynin  to 10 mg - Consider Myrbetriq if symptoms persist

## 2024-06-17 NOTE — Assessment & Plan Note (Signed)
 Plan to check A1c levels to assess glycemic control. - Order A1c test

## 2024-06-17 NOTE — Assessment & Plan Note (Signed)
 Hypothyroidism previously normalized. Plan to monitor thyroid  function to ensure stability. - Order TSH test

## 2024-06-17 NOTE — Assessment & Plan Note (Signed)
Well controlled Continue current medications Recheck metabolic panel 

## 2024-06-17 NOTE — Assessment & Plan Note (Signed)
 Chronic kidney disease requires monitoring. Plan to assess kidney function with lab tests. She has an upcoming nephrology appointment. - Order CMP to assess kidney function

## 2024-06-17 NOTE — Assessment & Plan Note (Signed)
 Persistent left shoulder pain following shoulder replacement surgery in March. Pain is significant at night, especially when rolling onto the left side. Reports improved function but ongoing discomfort. Advised that pain may improve around six months post-surgery. - Follow up with orthopedic surgeon as scheduled

## 2024-06-17 NOTE — Assessment & Plan Note (Signed)
 Hyperlipidemia management requires monitoring. Plan to check lipid levels to assess control. - Order lipid panel

## 2024-06-18 ENCOUNTER — Ambulatory Visit: Payer: Self-pay | Admitting: Family Medicine

## 2024-06-18 DIAGNOSIS — R7989 Other specified abnormal findings of blood chemistry: Secondary | ICD-10-CM

## 2024-06-19 ENCOUNTER — Telehealth: Payer: Self-pay

## 2024-06-19 MED ORDER — LEVOTHYROXINE SODIUM 88 MCG PO TABS: 88.0000 ug | ORAL_TABLET | Freq: Every day | ORAL | 1 refills | Status: AC

## 2024-06-19 NOTE — Telephone Encounter (Signed)
 Spoke with patient and prescription sent in.

## 2024-06-19 NOTE — Telephone Encounter (Signed)
 Copied from CRM [id number]. Topic: Clinical - Lab/Test Results >> Jun 18, 2024  4:25 PM Santiya F wrote: Reason for CRM: Patient is calling in because she received a call from the office. Patient has received her lab results & had questions about her thyroid . Please follow up with patient.

## 2024-09-01 ENCOUNTER — Other Ambulatory Visit: Payer: Self-pay | Admitting: Surgery

## 2024-09-01 DIAGNOSIS — Z96612 Presence of left artificial shoulder joint: Secondary | ICD-10-CM

## 2024-09-01 DIAGNOSIS — M12812 Other specific arthropathies, not elsewhere classified, left shoulder: Secondary | ICD-10-CM

## 2024-09-01 DIAGNOSIS — M7582 Other shoulder lesions, left shoulder: Secondary | ICD-10-CM

## 2024-09-10 ENCOUNTER — Ambulatory Visit

## 2024-09-22 ENCOUNTER — Ambulatory Visit
Admission: RE | Admit: 2024-09-22 | Discharge: 2024-09-22 | Disposition: A | Discharge status: Home / Self Care | Source: Ambulatory Visit | Attending: Surgery | Admitting: Surgery

## 2024-09-22 ENCOUNTER — Other Ambulatory Visit

## 2024-09-22 DIAGNOSIS — M12812 Other specific arthropathies, not elsewhere classified, left shoulder: Secondary | ICD-10-CM | POA: Diagnosis present

## 2024-09-22 DIAGNOSIS — Z96612 Presence of left artificial shoulder joint: Secondary | ICD-10-CM | POA: Diagnosis present

## 2024-09-22 DIAGNOSIS — M7582 Other shoulder lesions, left shoulder: Secondary | ICD-10-CM | POA: Diagnosis present

## 2024-12-04 ENCOUNTER — Other Ambulatory Visit: Payer: Self-pay | Admitting: Family Medicine

## 2024-12-16 ENCOUNTER — Ambulatory Visit: Admitting: Family Medicine

## 2024-12-18 ENCOUNTER — Ambulatory Visit: Admitting: Family Medicine

## 2024-12-25 ENCOUNTER — Encounter: Payer: Self-pay | Admitting: Family Medicine

## 2024-12-25 ENCOUNTER — Ambulatory Visit (INDEPENDENT_AMBULATORY_CARE_PROVIDER_SITE_OTHER): Admitting: Family Medicine

## 2024-12-25 VITALS — BP 140/74 | HR 68 | Resp 14 | Ht 60.0 in | Wt 132.0 lb

## 2024-12-25 DIAGNOSIS — E782 Mixed hyperlipidemia: Secondary | ICD-10-CM | POA: Diagnosis not present

## 2024-12-25 DIAGNOSIS — I5032 Chronic diastolic (congestive) heart failure: Secondary | ICD-10-CM | POA: Diagnosis not present

## 2024-12-25 DIAGNOSIS — N3281 Overactive bladder: Secondary | ICD-10-CM | POA: Diagnosis not present

## 2024-12-25 DIAGNOSIS — I2510 Atherosclerotic heart disease of native coronary artery without angina pectoris: Secondary | ICD-10-CM | POA: Diagnosis not present

## 2024-12-25 DIAGNOSIS — I10 Essential (primary) hypertension: Secondary | ICD-10-CM | POA: Diagnosis not present

## 2024-12-25 DIAGNOSIS — N1832 Chronic kidney disease, stage 3b: Secondary | ICD-10-CM | POA: Diagnosis not present

## 2024-12-25 DIAGNOSIS — R7303 Prediabetes: Secondary | ICD-10-CM | POA: Diagnosis not present

## 2024-12-25 DIAGNOSIS — E039 Hypothyroidism, unspecified: Secondary | ICD-10-CM

## 2024-12-25 MED ORDER — BUDESONIDE-FORMOTEROL FUMARATE 160-4.5 MCG/ACT IN AERO: 2.0000 | INHALATION_SPRAY | Freq: Two times a day (BID) | RESPIRATORY_TRACT | 11 refills | Status: AC

## 2024-12-25 NOTE — Assessment & Plan Note (Signed)
 Last A1C of 6.3. Will recheck today to assess for further management.

## 2024-12-25 NOTE — Assessment & Plan Note (Signed)
 Well-controlled with Crestor  20. Last LDL of 61. Will continue to monitor annually.

## 2024-12-25 NOTE — Assessment & Plan Note (Signed)
 Stable CKD requiring continued monitoring With previous GFR of 36. Followed by nephrology with upcoming apt.  - Order CMP to reassess kidney function

## 2024-12-25 NOTE — Assessment & Plan Note (Signed)
 Well controlled with amlodipine  10 and valsartan  320 daily. BP reading today 140/74 and does regular home readings as well.  - Continue current antihypertensive regimen - Recheck metabolic panel

## 2024-12-25 NOTE — Progress Notes (Signed)
 "  Established Patient Office Visit  Subjective   Patient ID: Andrea Hart, female    DOB: 28-Dec-1935  Age: 89 y.o. MRN: [id number]  Chief Complaint  Patient presents with   Medical Management of Chronic Issues    6 month f/u No other concerns    [name] presents today for 6 month f/u of chronic illness. Overall feeling well, the best I have felt in a long time. Has tolerated new dose of levothyroxine  well. Nocturia resolved after starting oxybutynin . Has upcoming apts with cardiology and nephrology. No new concerns today.       Review of Systems  Respiratory:  Negative for shortness of breath and wheezing.   Cardiovascular:  Negative for chest pain.      Objective:     BP (!) 140/74 Comment: home reading  Pulse 68   Resp 14   Ht 5' (1.524 m)   Wt 132 lb (59.9 kg)   SpO2 98%   BMI 25.78 kg/m    Physical Exam Constitutional:      Appearance: Normal appearance.  Cardiovascular:     Rate and Rhythm: Normal rate and regular rhythm.     Heart sounds: Murmur heard.  Pulmonary:     Effort: Pulmonary effort is normal.     Breath sounds: Normal breath sounds.  Neurological:     Mental Status: She is alert.      No results found for any visits on 12/25/24.    The ASCVD Risk score (Arnett DK, et al., 2019) failed to calculate for the following reasons:   The 2019 ASCVD risk score is only valid for ages 81 to 35   Risk score cannot be calculated because patient has a medical history suggesting prior/existing ASCVD   * - Cholesterol units were assumed    Assessment & Plan:   Problem List Items Addressed This Visit       Cardiovascular and Mediastinum   Essential hypertension - Primary   Well controlled with amlodipine  10 and valsartan  320 daily. BP reading today 140/74 and does regular home readings as well.  - Continue current antihypertensive regimen - Recheck metabolic panel      Relevant Orders   Comprehensive metabolic panel with GFR   CAD  (coronary artery disease)   Well-controlled with DAPT. Continued following with cardiology.      Chronic heart failure with preserved ejection fraction (HFpEF) (HCC)   Currently managed with spironolactone  25. Stable and followed by cardiology.        Endocrine   Hypothyroidism   Currently managed with levothyroxine  . Previous TSH showed hypothyroidism. Will recheck today to reassess if dose is therapeutic.       Relevant Orders   TSH     Genitourinary   CKD (chronic kidney disease), stage III (HCC)   Stable CKD requiring continued monitoring With previous GFR of 36. Followed by nephrology with upcoming apt.  - Order CMP to reassess kidney function      Relevant Orders   CBC w/Diff/Platelet   OAB (overactive bladder)   Currently managed with oxybutynin  XL 10mg . Reports less waking up and better rest. Continue regimen and monitor symptoms.         Other   Hyperlipidemia   Well-controlled with Crestor  20. Last LDL of 61. Will continue to monitor annually.       Prediabetes   Last A1C of 6.3. Will recheck today to assess for further management.       Relevant Orders  Hemoglobin A1c    Return in about 6 months (around 06/24/2025) for CPE.    Kamryn J Mills, Medical Student   Patient seen along with MS3 student, Johnsie Barefoot. I personally evaluated this patient along with the student, and verified all aspects of the history, physical exam, and medical decision making as documented by the student. I agree with the student's documentation and have made all necessary edits.  Evgenia Merriman, Jon HERO, MD, MPH [facility]    "

## 2024-12-25 NOTE — Assessment & Plan Note (Signed)
 Currently managed with levothyroxine  . Previous TSH showed hypothyroidism. Will recheck today to reassess if dose is therapeutic.

## 2024-12-25 NOTE — Assessment & Plan Note (Signed)
 Currently managed with oxybutynin  XL 10mg . Reports less waking up and better rest. Continue regimen and monitor symptoms.

## 2024-12-25 NOTE — Assessment & Plan Note (Signed)
 Currently managed with spironolactone  25. Stable and followed by cardiology.

## 2024-12-25 NOTE — Assessment & Plan Note (Signed)
 Well-controlled with DAPT. Continued following with cardiology.

## 2024-12-26 LAB — COMPREHENSIVE METABOLIC PANEL WITH GFR
ALT: 16 IU/L (ref 0–32)
AST: 25 IU/L (ref 0–40)
Albumin: 4 g/dL (ref 3.7–4.7)
Alkaline Phosphatase: 94 IU/L (ref 48–129)
BUN/Creatinine Ratio: 16 (ref 12–28)
BUN: 19 mg/dL (ref 8–27)
Bilirubin Total: 0.2 mg/dL (ref 0.0–1.2)
CO2: 22 mmol/L (ref 20–29)
Calcium: 9.4 mg/dL (ref 8.7–10.3)
Chloride: 101 mmol/L (ref 96–106)
Creatinine, Ser: 1.17 mg/dL — ABNORMAL HIGH (ref 0.57–1.00)
Globulin, Total: 2.4 g/dL (ref 1.5–4.5)
Glucose: 158 mg/dL — ABNORMAL HIGH (ref 70–99)
Potassium: 3.9 mmol/L (ref 3.5–5.2)
Sodium: 139 mmol/L (ref 134–144)
Total Protein: 6.4 g/dL (ref 6.0–8.5)
eGFR: 45 mL/min/1.73 — ABNORMAL LOW

## 2024-12-26 LAB — CBC WITH DIFFERENTIAL/PLATELET
Basophils Absolute: 0.1 x10E3/uL (ref 0.0–0.2)
Basos: 1 %
EOS (ABSOLUTE): 0.3 x10E3/uL (ref 0.0–0.4)
Eos: 4 %
Hematocrit: 32 % — ABNORMAL LOW (ref 34.0–46.6)
Hemoglobin: 10.3 g/dL — ABNORMAL LOW (ref 11.1–15.9)
Immature Grans (Abs): 0 x10E3/uL (ref 0.0–0.1)
Immature Granulocytes: 0 %
Lymphocytes Absolute: 1.8 x10E3/uL (ref 0.7–3.1)
Lymphs: 23 %
MCH: 30.2 pg (ref 26.6–33.0)
MCHC: 32.2 g/dL (ref 31.5–35.7)
MCV: 94 fL (ref 79–97)
Monocytes Absolute: 0.7 x10E3/uL (ref 0.1–0.9)
Monocytes: 10 %
Neutrophils Absolute: 4.6 x10E3/uL (ref 1.4–7.0)
Neutrophils: 61 %
Platelets: 248 x10E3/uL (ref 150–450)
RBC: 3.41 x10E6/uL — ABNORMAL LOW (ref 3.77–5.28)
RDW: 12.5 % (ref 11.7–15.4)
WBC: 7.5 x10E3/uL (ref 3.4–10.8)

## 2024-12-26 LAB — HEMOGLOBIN A1C
Est. average glucose Bld gHb Est-mCnc: 126 mg/dL
Hgb A1c MFr Bld: 6 % — ABNORMAL HIGH (ref 4.8–5.6)

## 2024-12-26 LAB — TSH: TSH: 5.8 u[IU]/mL — ABNORMAL HIGH (ref 0.450–4.500)

## 2024-12-30 ENCOUNTER — Ambulatory Visit: Payer: Self-pay | Admitting: Family Medicine

## 2024-12-30 DIAGNOSIS — N1831 Chronic kidney disease, stage 3a: Secondary | ICD-10-CM

## 2024-12-30 DIAGNOSIS — E039 Hypothyroidism, unspecified: Secondary | ICD-10-CM

## 2024-12-31 NOTE — Progress Notes (Unsigned)
 "  Cardiology Office Note    Date:  01/01/2025   ID:  Andrea Hart, DOB 14-Jul-1936, MRN [id number]  PCP:  Myrla Jon HERO, MD  Cardiologist:  Deatrice Cage, MD  Electrophysiologist:  None   Chief Complaint: Follow-up  History of Present Illness:   Andrea Hart is a 89 y.o. female with history of CAD status post stenting in 2013 to the RCA status post PCI/DES to the RCA overlapping the previously placed stent on 05/24/2022, LBBB, CKD stage III, prior CVA x2, HTN, HLD, carotid artery disease status post left TCAR in 04/2023, GI bleeding in 02/2021, PVCs, hypothyroidism, asthma, and melanoma of the left arm s/p Mohs surgery who presents for follow-up of CAD.  She underwent prior coronary stenting in [place], [place] in 2013.  In 2017, she suffered a stroke.  ILR did not show any evidence of A-fib, and she has been maintained on aspirin  and clopidogrel  since.  In 01/2016, she complained of presyncope and underwent event monitoring.  This showed frequent PVCs with a 10% burden.  She reported a long history of PVCs and has been managed with beta-blocker therapy.  She has history of difficult to control hypertension with renal artery ultrasound in 07/2020 showing no evidence of RAS.  Carotid artery ultrasound in 11/2020 showed moderate, nonobstructive bilateral left greater than right ICA disease.  She was admitted in 02/2021 with rectal bleeding with GI feeling the presentation was most consistent with an acute diverticular bleed.  Aspirin  and clopidogrel  were held and she underwent outpatient colonoscopy in 04/2021 which demonstrated nonbleeding internal hemorrhoids, moderate diverticulosis without evidence of diverticular bleeding.  She was subsequently resumed on aspirin  and clopidogrel .  She was seen in the office in 02/2022 and was without symptoms of angina or decompensation.  She remained active with multiple social events.  Carotid artery ultrasound from 02/2022 demonstrated 1 to 39% RICA stenosis and 60  to 79% LICA stenosis (previously 50 to 69%).  She was seen on 05/16/2022 noting a 9 to 56-month history of exertional dyspnea that had become more pronounced.  Symptoms felt similar, though not as severe, to her prior angina leading up to PCI in 2013.  She was without frank chest pain.  Given concerning symptoms, she underwent LHC on 05/24/2022 which showed moderately to severely calcified coronary arteries with significant one-vessel CAD.  There was 95% stenosis in the proximal/mid RCA just before the previously placed stent.  LVEDP was upper limit of normal.  She underwent successful PCI/DES to the RCA overlapping with the previously placed stent.  Post-cath labs notable for a hemoglobin of 9.4.  Carvedilol  was decreased secondary to bradycardia.     She was seen in the office on 05/30/2022 and noted improvement in her dyspnea following PCI to the RCA.  She was frustrated as she continued to be fatigued, though this is subsequently improved with cardiac rehab.  Bradycardia improved following the tapering of carvedilol .  Echo on 06/22/2022 demonstrated an EF of 60 to 65%, no regional wall motion abnormalities, moderate LVH, grade 1 diastolic dysfunction, normal RV systolic function and ventricular cavity size, mildly dilated left atrium, mild mitral regurgitation, aortic valve sclerosis without evidence of stenosis, and an estimated right atrial pressure of 3 mmHg.     She was subsequently rechallenged on carvedilol  due to elevated BPs, though again reported intolerance leading to its discontinuation.     Carotid artery ultrasound from 04/10/2023 showed showed 80 to 99% left ICA stenosis with 1 to 39% right  ICA stenosis.  This was a progression of the left ICA stenosis that was previously noted to be 60 to 79% in 02/2022.  Subsequent CTA of the head/neck on 04/12/2023 demonstrated 75% stenosis of the proximal left ICA just beyond the bifurcation as well as atherosclerotic calcification at the right carotid bifurcation  and internal carotid arteries without significant stenosis.  She was evaluated by vascular surgery and underwent left TCAR on 04/18/2023 for asymptomatic critical ICA stenosis.  Follow-up carotid artery ultrasound on 05/28/2023 showed 1 to 39% right ICA stenosis with patent left ICA stenosis without obvious evidence of stenosis.   She was seen in the office on 06/22/2023 noting an increase in exertional fatigue and dyspnea that became more noticeable following TCAR.  She was without frank chest pain.  Lexiscan  MPI on 06/28/2023 showed no significant ischemia with an EF of 70%.  CT attenuation corrected images showed three-vessel coronary artery calcification and aortic atherosclerosis.  Overall, this was a low risk scan.  Echo on 07/25/2023 showed an EF of 60 to 65%, no regional wall motion abnormalities, mild LVH, grade 1 diastolic dysfunction, normal RV systolic function and ventricular cavity size, moderately dilated left atrium, mild mitral regurgitation with moderate mitral annular calcification, aortic valve sclerosis without evidence of stenosis, and an estimated right atrial pressure of 3 mmHg.   She was seen in the office on 08/03/2023 and continued to feel about the same as she had over the prior several visits, though a little improved off statin therapy.  She continued to note exertional shortness of breath and fatigue without frank angina.  No evidence of volume overload.  She was subsequently evaluated by pulmonology with PFTs showing normal spirometry without evidence of obstruction, restriction and with normal lung volumes.  DLCO was mildly reduced and felt to possibly be related to her underlying anemia.  Double contrast esophagogram did not show evidence of hiatal hernia.  CPX was declined.  No further pulmonology workup was indicated.   She was seen in the office on 10/03/2023 and continued to feel the same as she had over prior several visits, albeit with some improved fatigue following  adjustment of levothyroxine .  She did continue to note exertional dyspnea, without frank chest pain.  In this setting, she underwent R/LHC on 10/15/2023 showed patent coronary arteries with diffuse LAD and RCA calcification, mild nonobstructive plaquing in the LAD and LCx, and patent stents in the RCA with only mild in-stent restenosis.  Essentially normal RHC with preserved cardiac output.  The patient was well compensated from a cardiac perspective with no evidence of high-grade obstructive CAD.  She was seen in follow-up of cardiac cath in 10/2023 and continued to feel the same as she had at her prior visits with ongoing fatigue and shortness of breath without frank chest pain.  Given that she noted significant improvement in symptoms previously while in cardiac rehab, we attempted to reenroll her in cardiac/pulmonary rehab, they were unsuccessful with recommendation for her to join a program such as [employer].   She was seen in the office in 11/2023 noting a significant improvement in her functional status, fatigue, and dyspnea that occurred a couple days after her prior visit.  She was very active.  In the setting of intermittent LBBB she underwent echo on 12/13/2023 that showed an EF of 55 to 60%, septal wall motion consistent with bundle branch block, grade 1 diastolic dysfunction, normal RV systolic function and ventricular cavity size, mild mitral regurgitation with moderate mitral annular  calcification, aortic valve sclerosis without evidence of stenosis, and an estimated right atrial pressure of 3 mmHg.  She was last seen in the office in 02/2024 and continued to do well from a cardiac perspective with improved functional status.  She underwent shoulder surgery in 02/2024 without cardiac complication.  She comes in today and is doing very well from a cardiac perspective, without symptoms of angina or cardiac decompensation.  Her functional status continues to be significantly improved and is  improved today when compared to her visit in 06/2024.  No dyspnea, dizziness, presyncope, or syncope.  No lower extremity swelling or progressive orthopnea.  No falls or symptoms concerning for bleeding.  Has not been checking blood pressures at home up until recently when she was seen at her PCPs office with an initial blood pressure in the 170s mmHg systolic with recheck blood pressure in the 140s mmHg systolic.  Since then, she has been checking blood pressures at home with readings in the 170s to 180s mmHg systolic.  Adherent to cardiac pharmacotherapy.  Blood pressure in the office today at 180/90 with repeat blood pressure 176/88.  She had lunch at [employer] prior to appointment today.   Labs independently reviewed: 12/2024 - TSH 5.8, Hgb 10.3, PLT 248, A1c 6.0, BUN 19, serum creatinine 1.17, potassium 3.9, albumin 4.0, AST/ALT normal 06/2024 - TC 135, TG 141, HDL 50, LDL 61  Past Medical History:  Diagnosis Date   Abdominal hernia    Aortic atherosclerosis    Arthritis    Asthma    Carotid arterial disease    a.) 11/2020 Carotid U/S: Less than 50% RICA, 50-69% LICA; b.) s/p LEFT TCAR 04/18/2023   Cerebral microvascular disease    CKD (chronic kidney disease), stage III (HCC)    Coronary artery disease 02/2015   a.) cCTA 02/2015: Ca2+ 182 (68th %'ile); b.) PCI 02/12/2015 in [place]   done for stable angina --> subtotal occlusion mRCA (Resolute Integrity DES x 1); c.) LHC/PCI 05/24/2022: 10% mRCA, 95% p-mRCA (3.0 x 18 mm Onyx Frontier DES overlapping previous stent), 20% p-mLAD; d.) RHC 10/18/2023: 25% p-mLAD, 30-40% mLCx, 30-35% ISR mRCA - med mgmt   DDD (degenerative disc disease), cervical    GERD (gastroesophageal reflux disease)    GI bleeding 02/02/2021   History of blood transfusion    History of hiatal hernia (s/p Nissen fundoplication repair)    Hyperlipidemia    Hypertension    Hypothyroidism    LBBB (left bundle branch block)    Long term current use  of clopidogrel     Long-term use of aspirin  therapy    Pneumonia    PVC's (premature ventricular contractions)    a. 01/2020 Zio: RSR, 70, frequent PVCs with 10.5% burden.   Skin cancer (melanoma) (HCC) 06/03/2024   Left, [facility].   Stroke [facility]) 2017   a. 2017 - ILR did not show afib.   Stroke [facility])    a.) date unknown; has had CVAs x 2    Past Surgical History:  Procedure Laterality Date   ABDOMINAL HYSTERECTOMY     APPENDECTOMY     CATARACT EXTRACTION Bilateral    CHOLECYSTECTOMY     COLONOSCOPY WITH PROPOFOL  N/A 04/06/2021   Procedure: COLONOSCOPY WITH PROPOFOL ;  Surgeon: Toledo, Ladell POUR, MD;  Location: [facility] ENDOSCOPY;  Service: Gastroenterology;  Laterality: N/A;   CORONARY ANGIOPLASTY WITH STENT PLACEMENT Left 02/12/2015   Procedure: CORONARY ANGIOPLASTY WITH STENT PLACEMENT; Location: [facility], .   CORONARY STENT INTERVENTION  N/A 05/24/2022   Procedure: CORONARY STENT INTERVENTION;  Surgeon: Darron Deatrice LABOR, MD;  Location: [facility] INVASIVE CV LAB;  Service: Cardiovascular;  Laterality: N/A;   EYE SURGERY     HAND SURGERY Right    LEFT HEART CATH AND CORONARY ANGIOGRAPHY Left 05/24/2022   Procedure: LEFT HEART CATH AND CORONARY ANGIOGRAPHY;  Surgeon: Darron Deatrice LABOR, MD;  Location: [facility] INVASIVE CV LAB;  Service: Cardiovascular;  Laterality: Left;   LOOP RECORDER IMPLANT     NISSEN FUNDOPLICATION  2016   REPLACEMENT TOTAL KNEE Bilateral 2017   REVERSE SHOULDER ARTHROPLASTY Left 02/26/2024   Procedure: ARTHROPLASTY, SHOULDER, TOTAL, REVERSE;  Surgeon: Edie Norleen PARAS, MD;  Location: [facility] ORS;  Service: Orthopedics;  Laterality: Left;  MAKE SECOND CASE   RIGHT HEART CATH AND CORONARY ANGIOGRAPHY N/A 10/15/2023   Procedure: RIGHT HEART CATH AND CORONARY ANGIOGRAPHY;  Surgeon: Wonda Sharper, MD;  Location: [facility] INVASIVE CV LAB;  Service: Cardiovascular;  Laterality: N/A;   TOTAL HIP ARTHROPLASTY Right    TOTAL VAGINAL HYSTERECTOMY     TRANSCAROTID ARTERY  REVASCULARIZATION  Left 04/18/2023   Procedure: LEFT Transcarotid Artery Revascularization;  Surgeon: Serene Gaile ORN, MD;  Location: [facility] OR;  Service: Vascular;  Laterality: Left;   ULTRASOUND GUIDANCE FOR VASCULAR ACCESS  04/18/2023   Procedure: ULTRASOUND GUIDANCE FOR VASCULAR ACCESS;  Surgeon: Serene Gaile ORN, MD;  Location: [facility] OR;  Service: Vascular;;    Current Medications: Active Medications[1]  Allergies:   Patient has no known allergies.   Social History   Socioeconomic History   Marital status: Widowed    Spouse name: Not on file   Number of children: 2   Years of education: Not on file   Highest education level: Some college, no degree  Occupational History    Comment: retired photographer  Tobacco Use   Smoking status: Never   Smokeless tobacco: Never  Vaping Use   Vaping status: Never Used  Substance and Sexual Activity   Alcohol use: Not Currently    Comment: Occasional   Drug use: Never   Sexual activity: Never    Birth control/protection: Surgical  Other Topics Concern   Not on file  Social History Narrative   Moved here in 2019, had been living in [place].    Born here, raised daughters here.    Lives with daughter now, [place].   Lost one daughter in car wreck.    Social Drivers of Health   Tobacco Use: Low Risk (01/01/2025)   Patient History    Smoking Tobacco Use: Never    Smokeless Tobacco Use: Never    Passive Exposure: Not on file  Financial Resource Strain: Low Risk  (09/01/2024)   Received from [facility]   Overall Financial Resource Strain (CARDIA)    Difficulty of Paying Living Expenses: Not hard at all  Food Insecurity: No Food Insecurity (09/01/2024)   Received from [facility]   Epic    Within the past 12 months, you worried that your food would run out before you got the money to buy more.: Never true    Within the past 12 months, the food you bought just didn't last and you didn't have money to  get more.: Never true  Transportation Needs: No Transportation Needs (09/01/2024)   Received from [facility] - Transportation    In the past 12 months, has lack of transportation kept you from medical appointments or from getting medications?: No  Lack of Transportation (Non-Medical): No  Physical Activity: Insufficiently Active (09/07/2023)   Exercise Vital Sign    Days of Exercise per Week: 3 days    Minutes of Exercise per Session: 20 min  Stress: No Stress Concern Present (09/07/2023)   [employer] of [employer] - Occupational Stress Questionnaire    Feeling of Stress : Only a little  Social Connections: Moderately Integrated (09/07/2023)   Social Connection and Isolation Panel    Frequency of Communication with Friends and Family: More than three times a week    Frequency of Social Gatherings with Friends and Family: More than three times a week    Attends Religious Services: More than 4 times per year    Active Member of [employer] or Organizations: Yes    Attends Banker Meetings: More than 4 times per year    Marital Status: Widowed  Depression (PHQ2-9): Low Risk (12/25/2024)   Depression (PHQ2-9)    PHQ-2 Score: 0  Alcohol Screen: Low Risk (06/16/2024)   Alcohol Screen    Last Alcohol Screening Score (AUDIT): 0  Housing: Low Risk  (09/01/2024)   Received from [facility]   Epic    In the last 12 months, was there a time when you were not able to pay the mortgage or rent on time?: No    In the past 12 months, how many times have you moved where you were living?: 0    At any time in the past 12 months, were you homeless or living in a shelter (including now)?: No  Utilities: Not At Risk (09/01/2024)   Received from [facility]   Epic    In the past 12 months has the electric, gas, oil, or water  company threatened to shut off services in your home?: No  Health Literacy: Not on file      Family History:  The patient's family history includes Heart Problems in her brother, father, and mother; Heart attack in her brother and mother. There is no history of Colon cancer or Breast cancer.  ROS:   12-point review of systems is negative unless otherwise noted in the HPI.   EKGs/Labs/Other Studies Reviewed:    Studies reviewed were summarized above. The additional studies were reviewed today:  Limited echo 12/13/2023: 1. Left ventricular ejection fraction, by estimation, is 55 to 60%. The  left ventricle has normal function. The left ventricle demonstrates  regional wall motion abnormalities (septal motion consistent with bundle  branch block). Left ventricular  diastolic parameters are consistent with Grade I diastolic dysfunction  (impaired relaxation).   2. Right ventricular systolic function is normal. The right ventricular  size is normal. Tricuspid regurgitation signal is inadequate for assessing  PA pressure.   3. The mitral valve is normal in structure. Mild mitral valve  regurgitation. No evidence of mitral stenosis. Moderate mitral annular  calcification.   4. The aortic valve is calcified. There is mild calcification of the  aortic valve. Aortic valve regurgitation is not visualized. Aortic valve  sclerosis/calcification is present, without any evidence of aortic  stenosis. Aortic valve mean gradient  measures 8.0 mmHg.   5. The inferior vena cava is normal in size with greater than 50%  respiratory variability, suggesting right atrial pressure of 3 mmHg.  ___________   [facility] 10/15/2023: 1.  Patent coronary arteries with diffuse LAD and RCA calcification, mild nonobstructive plaquing in the LAD and left circumflex, and patent stents in the RCA with only  mild in-stent restenosis 2.  Essentially normal right heart catheterization with mean RA pressure 6 mmHg, PA pressure 33 over 15 mmHg, wedge pressure of 10 mmHg, and preserved cardiac output of 4.7 L/min.    Recommendations: Patient appears to be well compensated from a cardiac perspective.  She has no high-grade obstructive CAD.  Continue medical therapy. __________   2D echo 07/25/2023: 1. Left ventricular ejection fraction, by estimation, is 60 to 65%. The  left ventricle has normal function. The left ventricle has no regional  wall motion abnormalities. There is mild left ventricular hypertrophy.  Left ventricular diastolic parameters  are consistent with Grade I diastolic dysfunction (impaired relaxation).  The average left ventricular global longitudinal strain is -15.6 %.   2. Right ventricular systolic function is normal. The right ventricular  size is normal. Tricuspid regurgitation signal is inadequate for assessing  PA pressure.   3. Left atrial size was moderately dilated.   4. The mitral valve is normal in structure. Mild mitral valve  regurgitation. No evidence of mitral stenosis. Moderate mitral annular  calcification.   5. The aortic valve is normal in structure. There is mild calcification  of the aortic valve. Aortic valve regurgitation is not visualized. Aortic  valve sclerosis/calcification is present, without any evidence of aortic  stenosis.   6. The inferior vena cava is normal in size with greater than 50%  respiratory variability, suggesting right atrial pressure of 3 mmHg.  __________   Lexiscan  MPI 06/28/2023: Pharmacological myocardial perfusion imaging study with no significant  ischemia Normal wall motion, EF estimated at 70% No EKG changes concerning for ischemia at peak stress or in recovery. Left bundle branch block noted during infusion that by report returned to narrow complex QRS in recovery CT attenuation correction images with three-vessel coronary calcification, moderate aortic atherosclerosis Low risk scan __________   Carotid artery ultrasound 05/28/2023: Summary:  Right Carotid: Velocities in the right ICA are consistent with a 1-39% stenosis.    Left Carotid: The ECA appears >50% stenosed. Patent stent without obvious evidence of stenosis.   Vertebrals: Bilateral vertebral arteries demonstrate antegrade flow.  __________   Carotid artery ultrasound 04/10/2023: Summary:  Right Carotid: Velocities in the right ICA are consistent with a 1-39% stenosis.   Left Carotid: Velocities in the left ICA are consistent with a 80-99% stenosis. The ECA appears >50% stenosed.  __________   2D echo 06/22/2022: 1. Left ventricular ejection fraction, by estimation, is 60 to 65%. The  left ventricle has normal function. The left ventricle has no regional  wall motion abnormalities. There is moderate left ventricular hypertrophy.  Left ventricular diastolic  parameters are consistent with Grade I diastolic dysfunction (impaired  relaxation).   2. Right ventricular systolic function is normal. The right ventricular  size is normal.   3. Left atrial size was mildly dilated.   4. The mitral valve is normal in structure. Mild mitral valve  regurgitation. No evidence of mitral stenosis.   5. The aortic valve is normal in structure. Aortic valve regurgitation is  not visualized. Aortic valve sclerosis/calcification is present, without  any evidence of aortic stenosis.   6. The inferior vena cava is normal in size with greater than 50%  respiratory variability, suggesting right atrial pressure of 3 mmHg. __________   LHC 05/24/2022:   Mid RCA lesion is 10% stenosed.   Prox RCA to Mid RCA lesion is 95% stenosed.   Prox LAD to Mid LAD lesion is 20% stenosed.   A  drug-eluting stent was successfully placed using a STENT ONYX FRONTIER [id number].   Post intervention, there is a 0% residual stenosis.   1.  Moderately to severely calcified coronary arteries with significant one-vessel coronary artery disease.   There is 95% stenosis in the proximal/mid right coronary artery just before the previously placed stent.   2.  Left ventricular angiography was not  performed due to chronic kidney disease.  LVEDP was at the upper limit of normal. 3.  Successful angioplasty and drug-eluting stent placement to the right coronary artery overlapping with the previously placed stent. 4.  Difficult catheterization via the right radial artery due to significant tortuosity of the innominate and right subclavian arteries.  Consider alternative route in the future.   Recommendations: Dual antiplatelet therapy for at least 6 months. Aggressive treatment of risk factors. __________   Carotid artery ultrasound 02/23/2022: Summary:  Right Carotid: Velocities in the right ICA are consistent with a 1-39%  stenosis. Non-hemodynamically significant plaque <50% noted in the  CCA. The ECA appears <50% stenosed.   Left Carotid: Velocities in the left ICA are consistent with a 60-79%  stenosis. Non-hemodynamically significant plaque <50% noted in the  CCA. The ECA appears >50% stenosed.      The bilateral lobes of the thyroid  had a globuated  appearance   Suggest follow up study in 12 months.  __________   Renal artery ultrasound 07/16/2020: Summary:  Largest Aortic Diameter: 2.0 cm     Renal:     Right: Normal size right kidney. Normal right Resisitive Index.         Normal cortical thickness of right kidney. No evidence of         right renal artery stenosis. RRV flow present.  Left:  LRV flow present. No evidence of left renal artery stenosis.         Normal size of left kidney. Normal left Resistive Index.         Normal cortical thickness of the left kidney. __________   Outpatient cardiac monitoring 01/2020: Normal sinus rhythm with an average heart rate of 70 bpm. 1 short run of SVT lasted 4 beats. Frequent PVCs with a burden of 10.5%.  Ventricular bigeminy and trigeminy were present.   EKG:  EKG is ordered today.  The EKG ordered today demonstrates NSR, 64 bpm, LBBB  Recent Labs: 12/25/2024: ALT 16; BUN 19; Creatinine, Ser 1.17; Hemoglobin 10.3;  Platelets 248; Potassium 3.9; Sodium 139; TSH 5.800  Recent Lipid Panel    Component Value Date/Time   CHOL 135 06/16/2024 1304   TRIG 141 06/16/2024 1304   HDL 50 06/16/2024 1304   CHOLHDL 2.7 06/16/2024 1304   CHOLHDL 2.1 04/19/2023 0437   VLDL 23 04/19/2023 0437   LDLCALC 61 06/16/2024 1304    PHYSICAL EXAM:    VS:  BP (!) 180/90 (BP Location: Left Arm, Patient Position: Sitting, Cuff Size: Small)   Pulse 64 Comment: 75 oximeter  Ht 5' (1.524 m)   Wt 134 lb 6.4 oz (61 kg)   SpO2 98%   BMI 26.25 kg/m   BMI: Body mass index is 26.25 kg/m.  Physical Exam Vitals reviewed.  Constitutional:      Appearance: She is well-developed.  HENT:     Head: Normocephalic and atraumatic.  Eyes:     General:        Right eye: No discharge.        Left eye: No discharge.  Cardiovascular:     Rate  and Rhythm: Normal rate and regular rhythm.     Heart sounds: S1 normal and S2 normal. Heart sounds not distant. No midsystolic click and no opening snap. Murmur heard.     Systolic murmur is present with a grade of 2/6 at the upper right sternal border.     No friction rub.  Pulmonary:     Effort: Pulmonary effort is normal. No respiratory distress.     Breath sounds: Normal breath sounds. No decreased breath sounds, wheezing, rhonchi or rales.  Musculoskeletal:     Cervical back: Normal range of motion.     Right lower leg: No edema.     Left lower leg: No edema.  Skin:    General: Skin is warm and dry.     Nails: There is no clubbing.  Neurological:     Mental Status: She is alert and oriented to person, place, and time.  Psychiatric:        Speech: Speech normal.        Behavior: Behavior normal.        Thought Content: Thought content normal.        Judgment: Judgment normal.     Wt Readings from Last 3 Encounters:  01/01/25 134 lb 6.4 oz (61 kg)  12/25/24 132 lb (59.9 kg)  06/16/24 129 lb 6.4 oz (58.7 kg)     ASSESSMENT & PLAN:   CAD involving the native coronary  arteries with stable angina: She is doing very well and without symptoms concerning for angina or cardiac decompensation.  Continue aggressive risk factor modification and secondary prevention including aspirin  81 mg, clopidogrel  75 mg, amlodipine  10 mg, and rosuvastatin  20 mg.  No indication for further ischemic testing at this time.  HTN: Blood pressure is elevated at triage and at recheck.  BP readings consistently in the 170s to 180s mmHg systolic at home.  It is unclear for exactly how long her blood pressure has been running in the 170 to 180 mmHg systolic range.  Add hydralazine  25 mg twice daily.  She will update us  in 2 weeks time with BP readings with likely up titration to 50 mg twice daily or 25 mg 3 times daily.  Continue amlodipine  10 mg, spironolactone  25 mg, and valsartan  320 mg daily.  Has previously been intolerant to beta-blocker.  HLD: LDL 61 in 06/2024.  Remains on rosuvastatin  40 mg.  PVCs: Quiescent.  No longer on beta-blocker with fatigue and prior bradycardia.  Intermittent LBBB: No symptoms of presyncope or syncope.  Most recent echo showed preserved LV systolic function.  Carotid artery stenosis: Status post left TCAR in 04/2023 with ultrasound in 05/2023 showing patent stent with no obvious evidence of stenosis.  Overdue for follow-up with vascular surgery, prefers to follow-up locally.  Anticipate updating carotid artery ultrasound in follow-up as blood pressure improves.  History of CVA: No new deficits.  She remains on aspirin , clopidogrel , and rosuvastatin .  History of GI bleed/iron deficiency anemia: No symptoms concerning for bleeding.  CKD stage III: Followed by nephrology.  Murmur: Consistent with aortic valve sclerosis with recent echo showing no evidence of stenosis.  Mild mitral regurgitation can be monitored periodically.  No indication for intervention at this time.    Disposition: F/u with Dr. Darron or an APP in 2 months to re-evaluate BP.   Medication  Adjustments/Labs and Tests Ordered: Current medicines are reviewed at length with the patient today.  Concerns regarding medicines are outlined above. Medication changes, Labs and Tests ordered  today are summarized above and listed in the Patient Instructions accessible in Encounters.   Signed, Bernardino Bring, PA-C 01/01/2025 5:11 PM     [facility] [place] [address] [place], [place] [address] [phone]     [1]  Current Meds  Medication Sig   acetaminophen  (TYLENOL ) 500 MG tablet Take 1,000 mg by mouth at bedtime.   albuterol  (VENTOLIN  HFA) 108 (90 Base) MCG/ACT inhaler Inhale 2 puffs into the lungs every 4 (four) hours as needed for wheezing or shortness of breath.   amLODipine  (NORVASC ) 10 MG tablet Take 1 tablet (10 mg total) by mouth daily.   aspirin  EC 81 MG tablet Take 81 mg by mouth every evening.   budesonide -formoterol  (SYMBICORT ) 160-4.5 MCG/ACT inhaler Inhale 2 puffs into the lungs 2 (two) times daily.   cetirizine  (ZYRTEC ) 10 MG tablet Take 1 tablet (10 mg total) by mouth at bedtime.   Cholecalciferol (VITAMIN D3) 125 MCG (5000 UT) TABS Take 1,000 Units by mouth daily.   clobetasol (TEMOVATE) 0.05 % external solution Apply 1 Application topically 2 (two) times daily.   clopidogrel  (PLAVIX ) 75 MG tablet TAKE 1 TABLET BY MOUTH EVERY DAY   FeFum-FePoly-FA-B Cmp-C-Biot (FOLIVANE-PLUS) CAPS Take 1 capsule by mouth daily in the afternoon.   Fluocinolone Acetonide Scalp 0.01 % OIL    hydrALAZINE  (APRESOLINE ) 25 MG tablet Take 1 tablet (25 mg total) by mouth in the morning and at bedtime.   levothyroxine  (SYNTHROID ) 88 MCG tablet Take 1 tablet (88 mcg total) by mouth daily.   methocarbamol  (ROBAXIN ) 500 MG tablet TAKE 1 TABLET (500 MG TOTAL) BY MOUTH EVERY DAY AT BEDTIME AS NEEDED FOR MUSCLE SPASM   montelukast  (SINGULAIR ) 10 MG tablet TAKE 1 TABLET BY MOUTH EVERYDAY AT BEDTIME   Multiple Vitamin (MULTIVITAMIN WITH MINERALS) TABS tablet Take 1 tablet  by mouth daily.   omeprazole  (PRILOSEC) 40 MG capsule TAKE 1 CAPSULE BY MOUTH TWICE A DAY   oxybutynin  (DITROPAN -XL) 10 MG 24 hr tablet TAKE 1 TABLET BY MOUTH EVERYDAY AT BEDTIME   sodium bicarbonate 650 MG tablet Take 1,300 mg by mouth 2 (two) times daily.   triamcinolone  cream (KENALOG ) 0.1 % Apply 1 Application topically 2 (two) times daily as needed (psoriasis (elbows)).   TURMERIC PO Take 1 capsule by mouth daily with lunch. Qunol Joint Comfort With Turmeric Capsules   valsartan  (DIOVAN ) 320 MG tablet Take 1 tablet (320 mg total) by mouth daily.   ZINC GLUCONATE PO Take 5 mg by mouth daily.   [DISCONTINUED] rosuvastatin  (CRESTOR ) 20 MG tablet Take 1 tablet (20 mg total) by mouth daily.   [DISCONTINUED] spironolactone  (ALDACTONE ) 25 MG tablet TAKE 1 TABLET (25 MG TOTAL) BY MOUTH DAILY.   "

## 2025-01-01 ENCOUNTER — Encounter: Payer: Self-pay | Admitting: Physician Assistant

## 2025-01-01 ENCOUNTER — Ambulatory Visit: Admitting: Physician Assistant

## 2025-01-01 ENCOUNTER — Other Ambulatory Visit: Payer: Self-pay | Admitting: Family Medicine

## 2025-01-01 VITALS — BP 180/90 | HR 64 | Ht 60.0 in | Wt 134.4 lb

## 2025-01-01 DIAGNOSIS — I6523 Occlusion and stenosis of bilateral carotid arteries: Secondary | ICD-10-CM

## 2025-01-01 DIAGNOSIS — M62838 Other muscle spasm: Secondary | ICD-10-CM

## 2025-01-01 DIAGNOSIS — Z8719 Personal history of other diseases of the digestive system: Secondary | ICD-10-CM | POA: Diagnosis not present

## 2025-01-01 DIAGNOSIS — I447 Left bundle-branch block, unspecified: Secondary | ICD-10-CM | POA: Diagnosis not present

## 2025-01-01 DIAGNOSIS — I25118 Atherosclerotic heart disease of native coronary artery with other forms of angina pectoris: Secondary | ICD-10-CM | POA: Diagnosis not present

## 2025-01-01 DIAGNOSIS — I10 Essential (primary) hypertension: Secondary | ICD-10-CM

## 2025-01-01 DIAGNOSIS — Z8673 Personal history of transient ischemic attack (TIA), and cerebral infarction without residual deficits: Secondary | ICD-10-CM

## 2025-01-01 DIAGNOSIS — N183 Chronic kidney disease, stage 3 unspecified: Secondary | ICD-10-CM

## 2025-01-01 DIAGNOSIS — M19012 Primary osteoarthritis, left shoulder: Secondary | ICD-10-CM

## 2025-01-01 DIAGNOSIS — I493 Ventricular premature depolarization: Secondary | ICD-10-CM

## 2025-01-01 DIAGNOSIS — K219 Gastro-esophageal reflux disease without esophagitis: Secondary | ICD-10-CM

## 2025-01-01 DIAGNOSIS — E785 Hyperlipidemia, unspecified: Secondary | ICD-10-CM

## 2025-01-01 DIAGNOSIS — J4520 Mild intermittent asthma, uncomplicated: Secondary | ICD-10-CM

## 2025-01-01 MED ORDER — HYDRALAZINE HCL 25 MG PO TABS: 25.0000 mg | ORAL_TABLET | Freq: Two times a day (BID) | ORAL | 3 refills | Status: AC

## 2025-01-01 MED ORDER — SPIRONOLACTONE 25 MG PO TABS: 25.0000 mg | ORAL_TABLET | Freq: Every day | ORAL | 3 refills | Status: AC

## 2025-01-01 MED ORDER — ROSUVASTATIN CALCIUM 20 MG PO TABS: 20.0000 mg | ORAL_TABLET | Freq: Every day | ORAL | 3 refills | Status: AC

## 2025-01-01 MED ORDER — FOLIVANE-PLUS PO CAPS: 1.0000 | ORAL_CAPSULE | Freq: Every day | ORAL | 11 refills | Status: AC

## 2025-01-01 NOTE — Patient Instructions (Signed)
 Medication Instructions:  Your physician recommends the following medication changes.  START TAKING: Hydralazine  25 mg twice daily  *If you need a refill on your cardiac medications before your next appointment, please call your pharmacy*  Lab Work: None ordered at this time  If you have labs (blood work) drawn today and your tests are completely normal, you will receive your results only by: MyChart Message (if you have MyChart) OR A paper copy in the mail If you have any lab test that is abnormal or we need to change your treatment, we will call you to review the results.  Testing/Procedures: None ordered at this time   Follow-Up: At [facility], you and your health needs are our priority.  As part of our continuing mission to provide you with exceptional heart care, our providers are all part of one team.  This team includes your primary Cardiologist (physician) and Advanced Practice Providers or APPs (Physician Assistants and Nurse Practitioners) who all work together to provide you with the care you need, when you need it.  Your next appointment:   2 month(s)  Provider:   You may see Deatrice Cage, MD or Bernardino Bring, PA-C

## 2025-01-01 NOTE — Telephone Encounter (Signed)
 LOV 12/25/24 NOV 06/17/25 LRF 06/16/24 q90 r1

## 2025-01-03 ENCOUNTER — Other Ambulatory Visit: Payer: Self-pay | Admitting: Family Medicine

## 2025-01-03 DIAGNOSIS — I10 Essential (primary) hypertension: Secondary | ICD-10-CM

## 2025-03-10 ENCOUNTER — Ambulatory Visit: Admitting: Physician Assistant

## 2025-06-17 ENCOUNTER — Ambulatory Visit
